# Patient Record
Sex: Male | Born: 1935
Health system: Southern US, Community
[De-identification: ages and names within clinical notes are randomized; demographics above are authoritative.]

## PROBLEM LIST (undated history)

## (undated) DIAGNOSIS — R4701 Aphasia: Secondary | ICD-10-CM

## (undated) DIAGNOSIS — R319 Hematuria, unspecified: Secondary | ICD-10-CM

## (undated) DIAGNOSIS — H8110 Benign paroxysmal vertigo, unspecified ear: Secondary | ICD-10-CM

## (undated) DIAGNOSIS — N320 Bladder-neck obstruction: Secondary | ICD-10-CM

## (undated) DIAGNOSIS — Z8546 Personal history of malignant neoplasm of prostate: Secondary | ICD-10-CM

## (undated) DIAGNOSIS — N3040 Irradiation cystitis without hematuria: Secondary | ICD-10-CM

## (undated) DIAGNOSIS — E785 Hyperlipidemia, unspecified: Secondary | ICD-10-CM

## (undated) DIAGNOSIS — M1990 Unspecified osteoarthritis, unspecified site: Secondary | ICD-10-CM

## (undated) DIAGNOSIS — R339 Retention of urine, unspecified: Secondary | ICD-10-CM

## (undated) DIAGNOSIS — K5790 Diverticulosis of intestine, part unspecified, without perforation or abscess without bleeding: Secondary | ICD-10-CM

## (undated) DIAGNOSIS — I10 Essential (primary) hypertension: Secondary | ICD-10-CM

## (undated) DIAGNOSIS — Z87440 Personal history of urinary (tract) infections: Secondary | ICD-10-CM

## (undated) DIAGNOSIS — R32 Unspecified urinary incontinence: Secondary | ICD-10-CM

## (undated) DIAGNOSIS — H269 Unspecified cataract: Secondary | ICD-10-CM

## (undated) DIAGNOSIS — K627 Radiation proctitis: Secondary | ICD-10-CM

## (undated) DIAGNOSIS — E039 Hypothyroidism, unspecified: Secondary | ICD-10-CM

## (undated) DIAGNOSIS — Z87898 Personal history of other specified conditions: Secondary | ICD-10-CM

## (undated) HISTORY — PX: SPINE SURGERY: SHX786

## (undated) HISTORY — DX: Hyperlipidemia, unspecified: E78.5

## (undated) HISTORY — PX: COLONOSCOPY W/ POLYPECTOMY: SHX1380

## (undated) HISTORY — DX: Unspecified cataract: H26.9

## (undated) HISTORY — DX: Aphasia: R47.01

## (undated) HISTORY — DX: Unspecified osteoarthritis, unspecified site: M19.90

## (undated) HISTORY — PX: EYE SURGERY: SHX253

## (undated) HISTORY — DX: Personal history of malignant neoplasm of prostate: Z85.46

## (undated) HISTORY — DX: Essential (primary) hypertension: I10

## (undated) HISTORY — DX: Diverticulosis of intestine, part unspecified, without perforation or abscess without bleeding: K57.90

## (undated) HISTORY — PX: KNEE ARTHROSCOPY: SUR90

## (undated) HISTORY — DX: Hypothyroidism, unspecified: E03.9

## (undated) HISTORY — DX: Benign paroxysmal vertigo, unspecified ear: H81.10

## (undated) HISTORY — PX: CATARACT EXTRACTION W/ INTRAOCULAR LENS IMPLANT: SHX1309

---

## 1994-01-25 HISTORY — PX: LUMBAR DISC SURGERY: SHX700

## 1995-01-26 HISTORY — PX: PROSTATECTOMY: SHX69

## 1997-04-25 ENCOUNTER — Encounter: Admission: RE | Admit: 1997-04-25 | Discharge: 1997-07-24 | Payer: Self-pay | Admitting: *Deleted

## 1997-07-09 ENCOUNTER — Ambulatory Visit (HOSPITAL_COMMUNITY): Admission: RE | Admit: 1997-07-09 | Discharge: 1997-07-09 | Payer: Self-pay | Admitting: Urology

## 2003-10-24 ENCOUNTER — Ambulatory Visit (HOSPITAL_COMMUNITY): Admission: RE | Admit: 2003-10-24 | Discharge: 2003-10-24 | Payer: Self-pay | Admitting: Gastroenterology

## 2003-10-24 ENCOUNTER — Encounter (INDEPENDENT_AMBULATORY_CARE_PROVIDER_SITE_OTHER): Payer: Self-pay | Admitting: Specialist

## 2004-07-06 ENCOUNTER — Ambulatory Visit (HOSPITAL_BASED_OUTPATIENT_CLINIC_OR_DEPARTMENT_OTHER): Admission: RE | Admit: 2004-07-06 | Discharge: 2004-07-06 | Payer: Self-pay | Admitting: Urology

## 2004-07-06 ENCOUNTER — Encounter (INDEPENDENT_AMBULATORY_CARE_PROVIDER_SITE_OTHER): Payer: Self-pay | Admitting: Specialist

## 2004-07-06 ENCOUNTER — Ambulatory Visit (HOSPITAL_COMMUNITY): Admission: RE | Admit: 2004-07-06 | Discharge: 2004-07-06 | Payer: Self-pay | Admitting: Urology

## 2004-07-06 HISTORY — PX: OTHER SURGICAL HISTORY: SHX169

## 2006-02-22 ENCOUNTER — Encounter: Admission: RE | Admit: 2006-02-22 | Discharge: 2006-03-10 | Payer: Self-pay | Admitting: Orthopedic Surgery

## 2007-10-11 ENCOUNTER — Ambulatory Visit (HOSPITAL_COMMUNITY): Admission: RE | Admit: 2007-10-11 | Discharge: 2007-10-11 | Payer: Self-pay | Admitting: Urology

## 2010-06-12 NOTE — Op Note (Signed)
NAMESALAH, NAKAMURA               ACCOUNT NO.:  [id number]   MEDICAL RECORD NO.:  [id number]          PATIENT TYPE:  AMB   LOCATION:  ENDO                         FACILITY:  [facility]   PHYSICIAN:  Danise Edge, M.D.   DATE OF BIRTH:  Nov 24, 1932   DATE OF PROCEDURE:  10/24/2003  DATE OF DISCHARGE:                                 OPERATIVE REPORT   PROCEDURE:  Colonoscopy with polypectomy.   INDICATIONS FOR PROCEDURE:  Mr. Jermiah Soderman is a 77 year old male born  04/22/1932.  Mr. Mceachin is scheduled to undergo his first screening  colonoscopy with polypectomy to prevent colon cancer.   ENDOSCOPIST:  Danise Edge, M.D.   PREMEDICATION:  Versed 6 mg, Demerol 60 mg.   DESCRIPTION OF PROCEDURE:  After obtaining informed consent, Mr. Buch was  placed in the left lateral decubitus position. I administered intravenous  Demerol and intravenous Versed to achieve conscious sedation for the  procedure. The patient's blood pressure, oxygen saturation and cardiac  rhythm were monitored throughout the procedure and documented in the medical  record.   Anal inspection was normal. Mr. Ollinger has received radiation therapy for  prostate cancer.  His anal canal was somewhat narrowed and I did not perform  a digital rectal exam. The Olympus adjustable pediatric colonoscope was  introduced into the rectum and advanced to the cecum. A normal appearing  ileocecal valve was intubated and the distal ileum inspected.  Colonic  preparation for the exam today was excellent.   Mr. Bhat has universal colonic diverticulosis without diverticulitis or  diverticular stricture formation.   RECTUM:  Mr. Ozer has mild radiation proctitis involving the distal  rectum.   SIGMOID COLON AND DESCENDING COLON:  At 30 cm from the anal verge, a 1 mm  sessile polyp was removed with the cold biopsy forceps.   SPLENIC FLEXURE:  Normal.   TRANSVERSE COLON:  Normal.   HEPATIC FLEXURE:  Normal.   ASCENDING  COLON:  Normal.   CECUM AND ILEOCECAL VALVE:  Normal.   ASSESSMENT:  1.  Small polyp was removed from the distal sigmoid colon with cold biopsy      forceps.  2.  Universal colonic diverticulosis.  3.  Mild radiation proctitis.      MJ/MEDQ  D:  10/24/2003  T:  10/24/2003  Job:  [id number]   cc:   Ike Bene, M.D.  [address]  [place]  [place] [address]  Fax: [phone]

## 2010-06-12 NOTE — Op Note (Signed)
[name], [name]               ACCOUNT NO.:  [id number]   MEDICAL RECORD NO.:  [id number]          PATIENT TYPE:  AMB   LOCATION:  [facility]                         FACILITY:  [facility]   PHYSICIAN:  Mark C. Vernie Ammons, M.D.  DATE OF BIRTH:  11-Jul-1932   DATE OF PROCEDURE:  DATE OF DISCHARGE:                                 OPERATIVE REPORT   PREOPERATIVE DIAGNOSES:  1.  Gross hematuria.  2.  Bladder neck contracture.   POSTOPERATIVE DIAGNOSES:  1.  Gross hematuria secondary to radiation cystitis.  2.  Bladder neck contracture.   PROCEDURES:  Cystoscopy, fulguration of bleeders and transurethral resection  of bladder neck contracture.   SURGEON:  Mark C. Vernie Ammons, M.D.   ANESTHESIA:  General.   SPECIMENS:  Bladder neck chips to pathology.   BLOOD LOSS:  Minimal.   COMPLICATIONS:  None.   DRAINS:  None.   COMPLICATIONS:  None.   INDICATIONS:  The patient is a 77 year old white male who has been having  difficulty with recurrent gross hematuria for several weeks. He has had this  difficulty intermittently for several months, but it has become more  frequent. He has had radiation for high-grade adenocarcinoma of the prostate  (Gleason 8) and was found to have radiation cystitis on cytoscopically back  in March of 2006. He also was noted to have a bladder neck contracture that  required dilation last month. He is brought to the OR today for treatment of  the bladder neck contracture and the recurrent bleeding. He said he bled as  recently as last night.   DESCRIPTION OF OPERATION:  After informed consent, the patient was brought  to the major OR, placed on the table, administered general anesthesia and  also received preoperative antibiotics. He was placed in the dorsal  lithotomy position after adequate general anesthesia and the genitalia were  then sterilely prepped and draped. Initially a 21-French cystoscope with 12  degrees lens was inserted and direct visualization  urethra revealed it was  entirely normal down to the bulbar urethra and just at the membranous  urethral region. There were some small telangiectatic vessels. Upon entering  the prostatic urethra, I noted some telangiectatic neovascularity at the  base of the prostate. The bladder neck contracture was noted. It was opened  somewhat from the previous dilatation, but there was still some bladder neck  contracture present. I then entered the bladder and inspected it with both  12 and 70 degree lenses. Ureteral orifices were of normal configuration and  position and there were no tumors or stones. There was marked telangiectatic  blood vessels on the base of the bladder. There was a single area that  appeared to have bled recently. This was photographed and then fulgurated  with the resectoscope. I then used the resectoscope to resect the bladder  neck contracture. I resected at both the 3 and 9 o'clock positions, opening  the bladder neck widely. The portions of bladder neck that were resected  were then removed from the bladder and then reinspection revealed some  bleeding in the prostatic urethra. This area was  fulgurated as were several  potential bleeding points within the bladder. These potential areas appeared  to be very superficial, thin-walled telangiectatic vessels. I then  fulgurated the neovascularity in the prostatic urethra and any bleeding  points. At the end of the procedure, there was no bleeding. I then drained  the bladder and removed the resectoscope and the patient was awakened and  taken to the recovery room in stable satisfactory condition. He tolerated  procedure well with no intraoperative complications.   He will be given a prescription for 24 Pyridium Plus for bladder irritation  and then return to see me if he has further bleeding. Otherwise I will see  him back in for his routine six-month check.       MCO/MEDQ  D:  07/06/2004  T:  07/06/2004  Job:  [id number]

## 2010-06-23 ENCOUNTER — Encounter: Payer: Self-pay | Admitting: Physician Assistant

## 2010-07-06 ENCOUNTER — Encounter: Payer: Self-pay | Admitting: Internal Medicine

## 2010-07-28 ENCOUNTER — Ambulatory Visit (AMBULATORY_SURGERY_CENTER): Payer: Medicare Other

## 2010-07-28 VITALS — Ht 74.0 in | Wt 250.5 lb

## 2010-07-28 DIAGNOSIS — R195 Other fecal abnormalities: Secondary | ICD-10-CM

## 2010-07-28 DIAGNOSIS — Z86010 Personal history of colonic polyps: Secondary | ICD-10-CM

## 2010-07-28 MED ORDER — PEG-KCL-NACL-NASULF-NA ASC-C 100 G PO SOLR: 1.0000 | Freq: Once | ORAL | Status: AC

## 2010-08-11 ENCOUNTER — Encounter: Payer: Self-pay | Admitting: Internal Medicine

## 2010-08-11 ENCOUNTER — Ambulatory Visit (AMBULATORY_SURGERY_CENTER): Payer: Medicare Other | Admitting: Internal Medicine

## 2010-08-11 VITALS — BP 142/89 | HR 61 | Temp 97.6°F | Resp 18 | Ht 74.0 in | Wt 250.0 lb

## 2010-08-11 DIAGNOSIS — D126 Benign neoplasm of colon, unspecified: Secondary | ICD-10-CM

## 2010-08-11 DIAGNOSIS — K627 Radiation proctitis: Secondary | ICD-10-CM | POA: Insufficient documentation

## 2010-08-11 DIAGNOSIS — Z1211 Encounter for screening for malignant neoplasm of colon: Secondary | ICD-10-CM

## 2010-08-11 DIAGNOSIS — Z860100 Personal history of colon polyps, unspecified: Secondary | ICD-10-CM | POA: Insufficient documentation

## 2010-08-11 DIAGNOSIS — K5730 Diverticulosis of large intestine without perforation or abscess without bleeding: Secondary | ICD-10-CM

## 2010-08-11 DIAGNOSIS — K624 Stenosis of anus and rectum: Secondary | ICD-10-CM

## 2010-08-11 MED ORDER — SODIUM CHLORIDE 0.9 % IV SOLN: 500.0000 mL | INTRAVENOUS | Status: DC

## 2010-08-11 NOTE — Patient Instructions (Addendum)
Please eat a diet that is high in fiber.  You may resume your medications as you would normally take them.  DO NOT USE Aspirin or Aspirin containing products(Advil or arthritis products) for 2 weeks (08/25/2010).   ONLY USE TYLENOL.

## 2010-08-12 ENCOUNTER — Telehealth: Payer: Self-pay

## 2010-08-12 NOTE — Telephone Encounter (Signed)
No answer

## 2010-08-19 ENCOUNTER — Encounter: Payer: Self-pay | Admitting: Internal Medicine

## 2011-06-01 DIAGNOSIS — C61 Malignant neoplasm of prostate: Secondary | ICD-10-CM | POA: Diagnosis not present

## 2011-06-08 DIAGNOSIS — C61 Malignant neoplasm of prostate: Secondary | ICD-10-CM | POA: Diagnosis not present

## 2011-07-01 DIAGNOSIS — R5383 Other fatigue: Secondary | ICD-10-CM | POA: Diagnosis not present

## 2011-07-01 DIAGNOSIS — E039 Hypothyroidism, unspecified: Secondary | ICD-10-CM | POA: Diagnosis not present

## 2011-07-01 DIAGNOSIS — I10 Essential (primary) hypertension: Secondary | ICD-10-CM | POA: Diagnosis not present

## 2011-07-01 DIAGNOSIS — R5381 Other malaise: Secondary | ICD-10-CM | POA: Diagnosis not present

## 2011-10-11 DIAGNOSIS — H26499 Other secondary cataract, unspecified eye: Secondary | ICD-10-CM | POA: Diagnosis not present

## 2011-11-30 DIAGNOSIS — C61 Malignant neoplasm of prostate: Secondary | ICD-10-CM | POA: Diagnosis not present

## 2011-12-07 DIAGNOSIS — E291 Testicular hypofunction: Secondary | ICD-10-CM | POA: Diagnosis not present

## 2011-12-07 DIAGNOSIS — N281 Cyst of kidney, acquired: Secondary | ICD-10-CM | POA: Diagnosis not present

## 2011-12-07 DIAGNOSIS — C61 Malignant neoplasm of prostate: Secondary | ICD-10-CM | POA: Diagnosis not present

## 2011-12-29 DIAGNOSIS — Z23 Encounter for immunization: Secondary | ICD-10-CM | POA: Diagnosis not present

## 2012-01-03 DIAGNOSIS — E039 Hypothyroidism, unspecified: Secondary | ICD-10-CM | POA: Diagnosis not present

## 2012-01-03 DIAGNOSIS — I10 Essential (primary) hypertension: Secondary | ICD-10-CM | POA: Diagnosis not present

## 2012-06-08 DIAGNOSIS — C61 Malignant neoplasm of prostate: Secondary | ICD-10-CM | POA: Diagnosis not present

## 2012-06-10 ENCOUNTER — Emergency Department (HOSPITAL_COMMUNITY)
Admission: EM | Admit: 2012-06-10 | Discharge: 2012-06-10 | Disposition: A | Discharge status: Home / Self Care | Payer: Medicare Other | Attending: Emergency Medicine | Admitting: Emergency Medicine

## 2012-06-10 ENCOUNTER — Encounter (HOSPITAL_COMMUNITY): Payer: Self-pay | Admitting: Emergency Medicine

## 2012-06-10 DIAGNOSIS — R3915 Urgency of urination: Secondary | ICD-10-CM | POA: Insufficient documentation

## 2012-06-10 DIAGNOSIS — Z7982 Long term (current) use of aspirin: Secondary | ICD-10-CM | POA: Diagnosis not present

## 2012-06-10 DIAGNOSIS — Z862 Personal history of diseases of the blood and blood-forming organs and certain disorders involving the immune mechanism: Secondary | ICD-10-CM | POA: Diagnosis not present

## 2012-06-10 DIAGNOSIS — Z923 Personal history of irradiation: Secondary | ICD-10-CM | POA: Insufficient documentation

## 2012-06-10 DIAGNOSIS — Z8669 Personal history of other diseases of the nervous system and sense organs: Secondary | ICD-10-CM | POA: Insufficient documentation

## 2012-06-10 DIAGNOSIS — Z9079 Acquired absence of other genital organ(s): Secondary | ICD-10-CM | POA: Diagnosis not present

## 2012-06-10 DIAGNOSIS — Z8719 Personal history of other diseases of the digestive system: Secondary | ICD-10-CM | POA: Insufficient documentation

## 2012-06-10 DIAGNOSIS — R319 Hematuria, unspecified: Secondary | ICD-10-CM | POA: Insufficient documentation

## 2012-06-10 DIAGNOSIS — R339 Retention of urine, unspecified: Secondary | ICD-10-CM | POA: Diagnosis present

## 2012-06-10 DIAGNOSIS — R109 Unspecified abdominal pain: Secondary | ICD-10-CM | POA: Insufficient documentation

## 2012-06-10 DIAGNOSIS — Z79899 Other long term (current) drug therapy: Secondary | ICD-10-CM | POA: Insufficient documentation

## 2012-06-10 DIAGNOSIS — E039 Hypothyroidism, unspecified: Secondary | ICD-10-CM | POA: Diagnosis not present

## 2012-06-10 DIAGNOSIS — Z87891 Personal history of nicotine dependence: Secondary | ICD-10-CM | POA: Insufficient documentation

## 2012-06-10 DIAGNOSIS — Z860100 Personal history of colon polyps, unspecified: Secondary | ICD-10-CM | POA: Diagnosis not present

## 2012-06-10 DIAGNOSIS — Z8639 Personal history of other endocrine, nutritional and metabolic disease: Secondary | ICD-10-CM | POA: Diagnosis not present

## 2012-06-10 DIAGNOSIS — N320 Bladder-neck obstruction: Secondary | ICD-10-CM | POA: Diagnosis not present

## 2012-06-10 DIAGNOSIS — Z9889 Other specified postprocedural states: Secondary | ICD-10-CM | POA: Diagnosis not present

## 2012-06-10 DIAGNOSIS — I10 Essential (primary) hypertension: Secondary | ICD-10-CM | POA: Diagnosis not present

## 2012-06-10 DIAGNOSIS — Z8546 Personal history of malignant neoplasm of prostate: Secondary | ICD-10-CM | POA: Insufficient documentation

## 2012-06-10 LAB — URINE MICROSCOPIC-ADD ON

## 2012-06-10 LAB — URINALYSIS, ROUTINE W REFLEX MICROSCOPIC
Bilirubin Urine: NEGATIVE
Glucose, UA: NEGATIVE mg/dL
Ketones, ur: NEGATIVE mg/dL
Nitrite: NEGATIVE
Protein, ur: 30 mg/dL — AB
Specific Gravity, Urine: 1.014 (ref 1.005–1.030)
Urobilinogen, UA: 0.2 mg/dL (ref 0.0–1.0)
pH: 6.5 (ref 5.0–8.0)

## 2012-06-10 MED ORDER — CEPHALEXIN 500 MG PO CAPS: 500.0000 mg | ORAL_CAPSULE | Freq: Three times a day (TID) | ORAL | Status: DC

## 2012-06-10 NOTE — ED Provider Notes (Signed)
History     CSN: [id number]  Arrival date & time 06/10/12  1516   First MD Initiated Contact with Patient 06/10/12 1531      Chief Complaint  Patient presents with  . Urinary Retention    (Consider location/radiation/quality/duration/timing/severity/associated sxs/prior treatment) HPI Comments: Pt with prostatectomy many years ago for prostate cancer, had radiation, has had intermittent problems with scar tissue in the past causing retention and requiring surgical intervention.  He reprots this AM at 5 AM, woke up and had to go badly, managed to go a lot.  He then later at 6:30 AM had a BM and noticed some blood from his urine.  Since then has had extreme difficulty trying to urinate and now is quite uncomfortable from retention.  He spoke to Dr. Margarita Grizzle with urology and was told to come to the ED as no one was in office.  Pt denies N/V, no fevers, chills, no radiation of pain to abdomen, back.  No other meds or therapies attempted prior to arrival.  The history is provided by the patient and the spouse.    Past Medical History  Diagnosis Date  . History of prostate cancer   . Colon polyps   . BPPV (benign paroxysmal positional vertigo)   . Hypertension   . Vitamin D deficiency   . Hypothyroidism   . Diverticulosis     Past Surgical History  Procedure Laterality Date  . Prostatectomy  1997  . Ly-s discects  1996  . Back surgery    . Colonoscopy w/ polypectomy  2005; 08/11/2010    2005: 1 cm hyperplastic sigmoid polyp, Dr. Laural Benes 2012: hyperplastic splenic flexure polyp -1 cm, diverticulosis, radiation proctitis, anal stenosis    Family History  Problem Relation Age of Onset  . Prostate cancer Brother     History  Substance Use Topics  . Smoking status: Former Smoker    Quit date: 07/27/1968  . Smokeless tobacco: Former Neurosurgeon    Quit date: 07/27/1968  . Alcohol Use: No      Review of Systems  Constitutional: Negative for fever and chills.  Gastrointestinal:  Negative for nausea, vomiting and diarrhea.  Genitourinary: Positive for urgency, hematuria and difficulty urinating. Negative for flank pain, scrotal swelling and penile pain.    Allergies  Caffeine and Codeine  Home Medications   Current Outpatient Rx  Name  Route  Sig  Dispense  Refill  . amLODipine (NORVASC) 5 MG tablet   Oral   Take 5 mg by mouth daily.           Marland Kitchen aspirin 81 MG chewable tablet   Oral   Chew 81 mg by mouth daily.         . Calcium Carbonate-Vitamin D (OYST-CAL D PO)   Oral   Take by mouth. Take 600 mg daily         . FLAXSEED, LINSEED, PO   Oral   Take by mouth daily. Take 1/4 cup daily           . Leuprolide Acetate, 6 Month, (LUPRON DEPOT) 45 MG injection   Intramuscular   Inject 45 mg into the muscle every 6 (six) months. Next dose will be Tuesday 06-13-2012         . Levothyroxine Sodium (SYNTHROID PO)   Oral   Take 1 tablet by mouth daily.         Marland Kitchen lisinopril (PRINIVIL,ZESTRIL) 20 MG tablet   Oral   Take 20 mg by mouth  daily.           . RED YEAST RICE EXTRACT PO   Oral   Take by mouth. Take 2 pills- 1200 mg daily         . Specialty Vitamins Products (MAGNESIUM, AMINO ACID CHELATE,) 133 MG tablet   Oral   Take 1 tablet by mouth. Take 300 mg every other day            BP 178/73  Pulse 72  Temp(Src) 97.9 F (36.6 C) (Oral)  SpO2 97%  Physical Exam  Nursing note and vitals reviewed. Constitutional: He appears well-developed and well-nourished. No distress.  HENT:  Head: Normocephalic and atraumatic.  Eyes: EOM are normal. No scleral icterus.  Neck: Neck supple.  Pulmonary/Chest: Effort normal.  Abdominal: Soft. Normal appearance. He exhibits no distension. There is tenderness in the suprapubic area. There is no rebound, no guarding and no CVA tenderness.  Neurological: He is alert.  Skin: Skin is warm. He is not diaphoretic.    ED Course  Procedures (including critical care time)  Labs Reviewed  URINE  CULTURE  URINALYSIS, ROUTINE W REFLEX MICROSCOPIC   No results found.   1. Urinary retention     RA sat is 97% and I interpret to be adequate  4:48 PM Spoke to Dr. Margarita Grizzle who will see the patient in the emergency department.   Pt had uretheral stretching by Dr. Margarita Grizzle, catheter placed. He recommends keflex for 3 days, can follo wup with Dr. Vernie Ammons on Tuesday as previously scheduled.     MDM  Pt with prior h/o urinary retention due to scar tissue after radiation treatments years ago.  Pt with similar presentation with hematuria, and difficulty urinating since early this AM.  RN has tried cath and [facility] without success, meeting resistance and causing moderate discomfort to patient.  Will discuss with Dr. Margarita Grizzle.          Gavin Pound. Mosi Hannold, MD 06/10/12 1818

## 2012-06-10 NOTE — ED Notes (Signed)
Met resistance while trying to insert foley catheter.  MD notified.

## 2012-06-10 NOTE — Discharge Instructions (Signed)
Acute Urinary Retention, Male  You have been seen by a caregiver today because of your inability to urinate (pass your water).  This is a common problem in elderly males. As men age their prostates become larger and block the flow of urine from the bladder. This is usually a problem that has come on gradually. It is often first noticed by having to get up at night to urinate. This is because as the prostate enlarges it is more difficult to empty the bladder completely.  Treatment may involve a one time catheterization to empty the bladder. This is putting in a tube to drain your urine. Then you and your personal caregiver can decide at your earliest convenience how to handle this problem in the future. It may also be a problem that may not recur for years. Sometimes this problem can be caused by medications. In this case, all that is often necessary is to discontinue the offending agent.  If you are to leave the foley catheter (a long, narrow, hollow tube) in and go home with a drainage system, you will need to discuss the best course of action with your caregiver. While the catheter is in, maintain a good intake of fluids. Keep the drainage bag emptied and lower than your catheter. This is so contaminated (infected) urine will not be flowing back into your bladder. This could lead to a urinary tract infection.  Only take over-the-counter or prescription medicines for pain, discomfort, or fever as directed by your caregiver.   SEEK IMMEDIATE MEDICAL CARE IF:   You develop chills, fever, or show signs of generalized illness that occurs prior to seeing your caregiver.  Document Released: 04/19/2000 Document Revised: 04/05/2011 Document Reviewed: 01/03/2008  [employer] Patient Information 2013 [employer].

## 2012-06-10 NOTE — ED Notes (Signed)
Urology in to place foley catheter.

## 2012-06-10 NOTE — ED Notes (Signed)
Attempted foley insertion with 16 coude catheter.  Met resistance, unable to advance.  Dr. Oletta Lamas notified.

## 2012-06-10 NOTE — Consult Note (Signed)
Urology Consult  Requesting provider:  Dr. Oletta Lamas  CC: Gross hematuria. Urinary retention.  HPI: 79 year old male who is followed by my colleague, Dr. Vernie Ammons,  For prostate cancer. This was treated in the past with prostatectomy followed by radiation. He has a history of bladder neck contracture. He developed gross hematuria. This began 24 hours ago. It is the color of cherry Kool-Aid. It is associated with dysuria. It was worse with physical activity. It is better when he is able to void small amounts. He does not feel that his thinking his bladder well. He denies fever or chills. As mentioned, his history of bladder neck contracture.  I was consult to evaluate the patient. I tried to gently place an 41 French coud-tip catheter which was unsuccessful. Because of this I elected to proceed with cystoscopy, possible urethral dilation, and Foley catheter placement. I discussed with the patient the risk and benefits, and alternatives. Informed consent was obtained and signed. Please refer to procedure below.  PMH: Past Medical History  Diagnosis Date  . History of prostate cancer   . Colon polyps   . BPPV (benign paroxysmal positional vertigo)   . Hypertension   . Vitamin D deficiency   . Hypothyroidism   . Diverticulosis     PSH: Past Surgical History  Procedure Laterality Date  . Prostatectomy  1997  . Ly-s discects  1996  . Back surgery    . Colonoscopy w/ polypectomy  2005; 08/11/2010    2005: 1 cm hyperplastic sigmoid polyp, Dr. Laural Benes 2012: hyperplastic splenic flexure polyp -1 cm, diverticulosis, radiation proctitis, anal stenosis    Allergies: Allergies  Allergen Reactions  . Caffeine Shortness Of Breath and Palpitations  . Codeine Palpitations    Medications:  (Not in a hospital admission)   Social History: History   Social History  . Marital Status: Married    Spouse Name: N/A    Number of Children: N/A  . Years of Education: N/A   Occupational History  .  Not on file.   Social History Main Topics  . Smoking status: Former Smoker    Quit date: 07/27/1968  . Smokeless tobacco: Former Neurosurgeon    Quit date: 07/27/1968  . Alcohol Use: No  . Drug Use: No  . Sexually Active: Not on file   Other Topics Concern  . Not on file   Social History Narrative  . No narrative on file    Family History: Family History  Problem Relation Age of Onset  . Prostate cancer Brother     Review of Systems: Positive: Frequency, urgency. Negative: Chest pain, SOB, or fever.  A further 10 point review of systems was negative except what is listed in the HPI.  Physical Exam: Filed Vitals:   06/10/12 1601  BP: 178/73  Pulse: 72  Temp: 97.9 F (36.6 C)    General: No acute distress.  Awake. Head:  Normocephalic.  Atraumatic. ENT:  EOMI.  Mucous membranes moist Neck:  Supple.  No lymphadenopathy. CV:  S1 present. S2 present. Regular rate. Pulmonary: Equal effort bilaterally.  Clear to auscultation bilaterally. Abdomen: Soft.  Non- tender to palpation. Skin:  Normal turgor.  No visible rash. Extremity: No gross deformity of bilateral upper extremities.  No gross deformity of    bilateral lower extremities. Neurologic: Alert. Appropriate mood.  Penis:  Uncircumcised.  No lesions. Urethra: No Foley catheter in place.  Orthotopic meatus. Scrotum: No lesions.  No ecchymosis.  No erythema. .  Studies:  No results found for this basename: HGB, WBC, PLT,  in the last 72 hours  No results found for this basename: NA, K, CL, CO2, BUN, CREATININE, CALCIUM, MAGNESIUM, GFRNONAA, GFRAA,  in the last 72 hours   No results found for this basename: PT, INR, APTT,  in the last 72 hours   No components found with this basename: ABG,   PROCEDURE: After informed consent was obtained the patient's genitals were prepped and draped in usual sterile fashion. I placed 10 cc of lidocaine jelly into the patient's urethra. A flexible cystoscope was advanced through the  urethra into the bladder neck. There was white tissue at the bladder neck which resulted in a bladder neck contracture. I could see into the bladder, but I could not pass the cystoscope into the bladder indicating that this was approximately 14 [place] in size. It was noted to be very firm and hard because I could not pass the flexed the cystoscope beyond this. After this I tried multiple other catheter such as 33 French coud tip catheters but this was unsuccessful. We elected to proceed with dilation. I placed a wire into the bladder and ended up as to avoid the wire going into the ureter orifice. I then withdrew the cystoscope and noted the length of the cystoscope to the bladder neck contracture. I then loaded a balloon dilator over the wire several centimeters beyond the area where the cystoscope was at the bladder neck contracture. I then slowly inflated the balloon to 10 atmospheres and held for several minutes. I then deflated the balloon and removed it leaving the wire in place. I then passed the cystoscope with ease. I backed the cystoscope off and in place an 41 French council-tip catheter over the wire. This was somewhat difficult to pass, but I was able to manipulate it and passed into the bladder. 10 cc of sterile water were placed into the balloon. I then irrigated his bladder free of clot as I did note a clot in the bladder when I passed the cystoscope and. I was not able to adequately see well inside the bladder due to the hematuria to visualize any tumors. He tolerated the procedure well.   Assessment:  Gross hematuria. Bladder neck contracture.  Plan: -Successful cystoscopy, dilation of bladder neck contracture, foley catheter placement. -Please send home with keflex 500mg  PO TID x3 days. -F/u as scheduled w/ Dr. Vernie Ammons this coming Tuesday.    Pager: [phone]    CC: Dr. Oletta Lamas

## 2012-06-10 NOTE — ED Notes (Signed)
Patient with urinary retention and small amounts of bloody urine.  Started this morning.  Patient called his urologist and was told to come here.

## 2012-06-12 LAB — URINE CULTURE
Colony Count: NO GROWTH
Culture: NO GROWTH

## 2012-06-13 DIAGNOSIS — C61 Malignant neoplasm of prostate: Secondary | ICD-10-CM | POA: Diagnosis not present

## 2012-06-13 DIAGNOSIS — N3040 Irradiation cystitis without hematuria: Secondary | ICD-10-CM | POA: Diagnosis not present

## 2012-06-13 DIAGNOSIS — N320 Bladder-neck obstruction: Secondary | ICD-10-CM | POA: Diagnosis not present

## 2012-06-13 DIAGNOSIS — M949 Disorder of cartilage, unspecified: Secondary | ICD-10-CM | POA: Diagnosis not present

## 2012-06-13 DIAGNOSIS — M899 Disorder of bone, unspecified: Secondary | ICD-10-CM | POA: Diagnosis not present

## 2012-06-14 ENCOUNTER — Encounter (HOSPITAL_COMMUNITY): Payer: Self-pay | Admitting: *Deleted

## 2012-06-14 ENCOUNTER — Other Ambulatory Visit: Payer: Self-pay | Admitting: Urology

## 2012-06-14 ENCOUNTER — Emergency Department (HOSPITAL_COMMUNITY)
Admission: EM | Admit: 2012-06-14 | Discharge: 2012-06-14 | Disposition: A | Discharge status: Home / Self Care | Payer: Medicare Other | Attending: Emergency Medicine | Admitting: Emergency Medicine

## 2012-06-14 DIAGNOSIS — Z87891 Personal history of nicotine dependence: Secondary | ICD-10-CM | POA: Diagnosis not present

## 2012-06-14 DIAGNOSIS — Z8719 Personal history of other diseases of the digestive system: Secondary | ICD-10-CM | POA: Insufficient documentation

## 2012-06-14 DIAGNOSIS — Z860100 Personal history of colon polyps, unspecified: Secondary | ICD-10-CM | POA: Insufficient documentation

## 2012-06-14 DIAGNOSIS — I10 Essential (primary) hypertension: Secondary | ICD-10-CM | POA: Diagnosis not present

## 2012-06-14 DIAGNOSIS — R319 Hematuria, unspecified: Secondary | ICD-10-CM | POA: Diagnosis not present

## 2012-06-14 DIAGNOSIS — R3919 Other difficulties with micturition: Secondary | ICD-10-CM | POA: Diagnosis not present

## 2012-06-14 DIAGNOSIS — Z923 Personal history of irradiation: Secondary | ICD-10-CM | POA: Diagnosis not present

## 2012-06-14 DIAGNOSIS — Z8669 Personal history of other diseases of the nervous system and sense organs: Secondary | ICD-10-CM | POA: Insufficient documentation

## 2012-06-14 DIAGNOSIS — R109 Unspecified abdominal pain: Secondary | ICD-10-CM | POA: Insufficient documentation

## 2012-06-14 DIAGNOSIS — Z7982 Long term (current) use of aspirin: Secondary | ICD-10-CM | POA: Insufficient documentation

## 2012-06-14 DIAGNOSIS — T83091A Other mechanical complication of indwelling urethral catheter, initial encounter: Secondary | ICD-10-CM | POA: Diagnosis not present

## 2012-06-14 DIAGNOSIS — Z9889 Other specified postprocedural states: Secondary | ICD-10-CM | POA: Insufficient documentation

## 2012-06-14 DIAGNOSIS — Z862 Personal history of diseases of the blood and blood-forming organs and certain disorders involving the immune mechanism: Secondary | ICD-10-CM | POA: Diagnosis not present

## 2012-06-14 DIAGNOSIS — Z8546 Personal history of malignant neoplasm of prostate: Secondary | ICD-10-CM | POA: Insufficient documentation

## 2012-06-14 DIAGNOSIS — R300 Dysuria: Secondary | ICD-10-CM | POA: Insufficient documentation

## 2012-06-14 DIAGNOSIS — Z8639 Personal history of other endocrine, nutritional and metabolic disease: Secondary | ICD-10-CM | POA: Insufficient documentation

## 2012-06-14 DIAGNOSIS — N3289 Other specified disorders of bladder: Secondary | ICD-10-CM | POA: Diagnosis not present

## 2012-06-14 DIAGNOSIS — E039 Hypothyroidism, unspecified: Secondary | ICD-10-CM | POA: Insufficient documentation

## 2012-06-14 DIAGNOSIS — Z79899 Other long term (current) drug therapy: Secondary | ICD-10-CM | POA: Diagnosis not present

## 2012-06-14 DIAGNOSIS — Y846 Urinary catheterization as the cause of abnormal reaction of the patient, or of later complication, without mention of misadventure at the time of the procedure: Secondary | ICD-10-CM | POA: Insufficient documentation

## 2012-06-14 LAB — CBC WITH DIFFERENTIAL/PLATELET
Basophils Absolute: 0 10*3/uL (ref 0.0–0.1)
Basophils Relative: 0 % (ref 0–1)
Eosinophils Absolute: 0.3 10*3/uL (ref 0.0–0.7)
Eosinophils Relative: 5 % (ref 0–5)
MCH: 30.2 pg (ref 26.0–34.0)
MCHC: 33.3 g/dL (ref 30.0–36.0)
MCV: 90.7 fL (ref 78.0–100.0)
Neutrophils Relative %: 60 % (ref 43–77)
Platelets: 172 10*3/uL (ref 150–400)
RDW: 13 % (ref 11.5–15.5)

## 2012-06-14 LAB — BASIC METABOLIC PANEL
Calcium: 9.6 mg/dL (ref 8.4–10.5)
GFR calc Af Amer: 76 mL/min — ABNORMAL LOW (ref >90)
GFR calc non Af Amer: 66 mL/min — ABNORMAL LOW (ref >90)
Glucose, Bld: 125 mg/dL — ABNORMAL HIGH (ref 70–99)
Potassium: 4 mEq/L (ref 3.5–5.1)
Sodium: 134 mEq/L — ABNORMAL LOW (ref 135–145)

## 2012-06-14 NOTE — ED Notes (Signed)
Pt had catheter placed Saturday night; passing some blood; has been blocked since this after; states doctor told him to not remove catheter for any reason due to difficulty with placement initially; small amt blood tinged urine in bag

## 2012-06-14 NOTE — ED Provider Notes (Signed)
History     CSN: [id number]  Arrival date & time 06/14/12  1956   First MD Initiated Contact with Patient 06/14/12 2013      No chief complaint on file.   (Consider location/radiation/quality/duration/timing/severity/associated sxs/prior treatment) The history is provided by the patient and the spouse.  [name] is a 79 y.o. male history of hypertension, prostate cancer status post surgery and radiation with urethral stricture, with Foley placed 5 days ago presenting with blocked Foley. He noticed that his urine stopped flowing since yesterday. No fevers or chills or vomiting. He felt bladder spasms and lower abdominal pain. He is currently on Bactrim as per his urologist.    Past Medical History  Diagnosis Date  . History of prostate cancer   . Colon polyps   . BPPV (benign paroxysmal positional vertigo)   . Hypertension   . Vitamin D deficiency   . Hypothyroidism   . Diverticulosis     Past Surgical History  Procedure Laterality Date  . Prostatectomy  1997  . Ly-s discects  1996  . Back surgery    . Colonoscopy w/ polypectomy  2005; 08/11/2010    2005: 1 cm hyperplastic sigmoid polyp, Dr. Laural Benes 2012: hyperplastic splenic flexure polyp -1 cm, diverticulosis, radiation proctitis, anal stenosis    Family History  Problem Relation Age of Onset  . Prostate cancer Brother     History  Substance Use Topics  . Smoking status: Former Smoker    Quit date: 07/27/1968  . Smokeless tobacco: Former Neurosurgeon    Quit date: 07/27/1968  . Alcohol Use: No      Review of Systems  Genitourinary: Positive for hematuria and difficulty urinating.  All other systems reviewed and are negative.    Allergies  Caffeine and Codeine  Home Medications   Current Outpatient Rx  Name  Route  Sig  Dispense  Refill  . amLODipine (NORVASC) 5 MG tablet   Oral   Take 5 mg by mouth every morning.          Marland Kitchen aspirin 81 MG chewable tablet   Oral   Chew 81 mg by mouth every  morning.          . calcium carbonate (OS-CAL) 600 MG TABS   Oral   Take 600 mg by mouth every morning.         Marland Kitchen FLAXSEED, LINSEED, PO   Oral   Take 30 mLs by mouth every morning. Take 1/4 cup daily         . Leuprolide Acetate, 6 Month, (LUPRON DEPOT) 45 MG injection   Intramuscular   Inject 45 mg into the muscle every 6 (six) months. Next dose will be Tuesday 06-13-2012         . levothyroxine (SYNTHROID, LEVOTHROID) 25 MCG tablet   Oral   Take 25 mcg by mouth daily before breakfast.         . lisinopril (PRINIVIL,ZESTRIL) 20 MG tablet   Oral   Take 20 mg by mouth every morning.          Marland Kitchen PRESCRIPTION MEDICATION   Oral   Take 1 capsule by mouth every morning. Sample pack- 7 day course given by MD for bladder spasms         . RED YEAST RICE EXTRACT PO   Oral   Take 2 capsules by mouth every morning. Take 2 pills- 1200 mg daily         . Specialty Vitamins Products (MAGNESIUM,  AMINO ACID CHELATE,) 133 MG tablet   Oral   Take 1 tablet by mouth every other day. Take 300 mg every other day         . sulfamethoxazole-trimethoprim (BACTRIM DS) 800-160 MG per tablet   Oral   Take 1 tablet by mouth 2 (two) times daily.         . cephALEXin (KEFLEX) 500 MG capsule   Oral   Take 1 capsule (500 mg total) by mouth 3 (three) times daily.   10 capsule   0     BP 181/66  Pulse 81  Temp(Src) 98.1 F (36.7 C) (Oral)  Resp 20  SpO2 96%  Physical Exam  Nursing note and vitals reviewed. Constitutional: He is oriented to person, place, and time.  Uncomfortable   HENT:  Head: Normocephalic.  Mouth/Throat: Oropharynx is clear and moist.  Eyes: Conjunctivae are normal. Pupils are equal, round, and reactive to light.  Neck: Normal range of motion. Neck supple.  Cardiovascular: Normal rate, regular rhythm and normal heart sounds.   Pulmonary/Chest: Effort normal and breath sounds normal. No respiratory distress. He has no wheezes. He has no rales.   Abdominal: Soft. Bowel sounds are normal.  + suprapubic tenderness, + enlarged bladder. Foley with no urine output   Musculoskeletal: Normal range of motion.  Neurological: He is alert and oriented to person, place, and time.  Skin: Skin is warm and dry.  Psychiatric: He has a normal mood and affect. His behavior is normal. Judgment and thought content normal.    ED Course  Procedures (including critical care time)  Foley flushed with 60 cc normal saline. There are clots that are dislodged and his foley is draining out bloody urine that cleared.    Labs Reviewed  BASIC METABOLIC PANEL - Abnormal; Notable for the following:    Sodium 134 (*)    Glucose, Bld 125 (*)    GFR calc non Af Amer 66 (*)    GFR calc Af Amer 76 (*)    All other components within normal limits  CBC WITH DIFFERENTIAL   No results found.   No diagnosis found.    MDM  [name] is a 78 y.o. male here with blocked foley. I flushed the foley and it cleared up. Will check kidney function.   10:05 PM Cr nl. Still draining clear urine. He has urology appointment in about a week. Recommend continue abx. Return to ER if foley is obstructed again.         Richardean Canal, MD 06/14/12 [phone]

## 2012-06-15 ENCOUNTER — Encounter (HOSPITAL_BASED_OUTPATIENT_CLINIC_OR_DEPARTMENT_OTHER): Payer: Self-pay | Admitting: *Deleted

## 2012-06-15 NOTE — Progress Notes (Signed)
NPO AFTER MN. ARRIVES AT 0930. NEEDS ISTAT AND EKG. WILL TAKE NORVASC AND SYNTHROID AM OF SURG W/ SIP OF WATER.

## 2012-06-22 DIAGNOSIS — N320 Bladder-neck obstruction: Secondary | ICD-10-CM | POA: Diagnosis present

## 2012-06-22 NOTE — H&P (Signed)
[name] is a 79 year old male patient who recently developed urinary retention.   History of Present Illness This is a 79 year old patient seen in follow up for evaluation of prostate cancer.  Diagnosis: Pathologic stage, T3a, N0, MX prostate cancer with positive surgical margins, with extracapsular extension  Date of diagnosis: 1996  Type of treatment:  Retropubic Radical Prostatectomy  Date of treatment: 04/1994  Pretreatment PSA:   Gleason score: 8-9  Biochemical recurrence: 1998  Staging Studies: Bone Scan 09/2007:, CT of the abdomen/pelvis 09/2007:.  Prostate Biopsies: Positive prostate biopsy (03/1994).                           In 3/99 he underwent XRT for a PSA recurrence.       He began hormone ablation therapy with Degarelix on the CS 35 study protocol which began 11/09. He had been receiving Degarelix every 3 months. This resulted in a fall of his PSA and after completing the study his PSA began to rise slowly. He is was restarted on Lupron in 11/12 and has remained on that. He has developed hot flashes but they're mild and he does not want to try any medication for it.  Urinary function: He denies incontinence.  Erectile function: He does complain of erectile dysfunction.  Bowel function: He complains of fecal urgency, but denies any bowel related symptoms.  Other symptoms: no recent weight loss, no new bone or joint pain              He was found in 2/09 to have what appeared to be a Bosniak class IIF cyst of the left kidney which has been followed with serial imaging studies. No significant changes have been noted.  Interval history: He was unable to void and was seen by Dr. Margarita Grizzle who ended up having to perform cystoscopy. There was a bladder neck contracture that allowed the scope to see into the bladder but the scope could not be advanced through the contracture and therefore he was dilated and a catheter was placed.    Past Medical History Problems  1. History of   Bladder Neck Contracture 596.0 2. History of  Hypercholesterolemia 272.0 3. History of  Male Stress Incontinence 788.32 4. History of  Radiation Therapy V15.3 5. History of  Sudden Redness Of The Skin (Flushing) 782.62  Surgical History Problems  1. History of  Back Surgery 2. History of  Biopsy Of The Prostate Needle 3. History of  Prostatect Retropubic Radical W/ Bilat Pelv Lymphadenectomy  Current Meds 1. AmLODIPine Besylate 5 MG Oral Tablet; Therapy: (Recorded:15May2012) to 2. Aspirin 325 MG Oral Tablet; Therapy: (Recorded:21Aug2008) to 3. Calcium 600 TABS; Therapy: (Recorded:15May2012) to 4. Flax Seed Oil CAPS; Therapy: (Recorded:27Aug2009) to 5. Lisinopril 20 MG Oral Tablet; Therapy: (Recorded:15May2012) to 6. Magnesium TABS; Therapy: (Recorded:15May2012) to 7. Metamucil POWD; Therapy: (Recorded:15May2012) to 8. Red Yeast Rice CAPS; Therapy: (Recorded:15May2012) to 9. Selenium 200 MCG Oral Capsule; Therapy: (Recorded:14May2013) to 10. Thyroid TABS; OTC medication; Therapy: (Recorded:15May2012) to 11. Vitamin D3 CAPS; Therapy: (Recorded:15May2012) to  Allergies Medication  1. Codeine Derivatives  Family History Problems  1. Maternal history of  Congestive Heart Failure 2. Fraternal history of  Prostate Cancer V16.42  Social History Problems  1. Caffeine Use 1 2. Marital History - Currently Married 3. History of  Tobacco Use V15.82 quit 40 years ago; smoked one pack per day for 20 years  Review of Systems Genitourinary, constitutional and gastrointestinal system(s)  were reviewed and pertinent findings if present are noted.  Genitourinary: nocturia, but no incontinence.  Gastrointestinal: diarrhea.  Constitutional: no recent weight loss.  Musculoskeletal: no bone pain.    Vitals Vital Signs  Blood Pressure: 162 / 70 Temperature: 97.4 F Heart Rate: 73  BMI Calculated: 32.47 BSA Calculated: 2.34 Height: 6 ft 1 in Weight: 245 lb   Physical  Exam Constitutional: Well nourished and well developed . No acute distress.  ENT:. The ears and nose are normal in appearance.  Neck: The appearance of the neck is normal and no neck mass is present.  Pulmonary: No respiratory distress and normal respiratory rhythm and effort.  Cardiovascular:. No peripheral edema.  Rectal: Rectal exam demonstrates normal sphincter tone and the anus is normal on inspection. Normal rectal tone, no rectal masses, prostate is smooth, symmetric and non-tender. The prostate has no nodularity, is not indurated, is not tender and is not fluctuant. The perineum is normal on inspection.  Skin: Normal skin turgor, no visible rash and no visible skin lesions.  Neuro/Psych:. Mood and affect are appropriate.    Assessment Assessed  1. Prostate Cancer 185 2. 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 Bladder Neck Contracture 596.0   His DEXA scan revealed a T score of -1.2 at the femoral neck and a T score of 1.3 total 4 views lumbar spine placing him at a very low risk of a SRE. I have recommended he remain on vitamin D, calcium and weightbearing exercise.  His prostate cancer seems to be under good control with Lupron. His testosterone is castrate and his PSA actually has decreased. I recommended he remain on Lupron.  He has redeveloped his bladder neck contracture. We therefore discussed transurethral incision of the bladder neck as an outpatient under anesthesia. I've gone over the procedure with him in detail including its risks and palpitations, the probability of success in the outpatient nature the procedure as well as anticipated postoperative course. He understands and has elected to proceed.   Plan Bladder Neck Contracture (596.0)           1. Begin Septra DS while a Foley catheter remains indwelling prior to surgery. 2. He'll be scheduled for transurethral resection of his bladder neck contracture.

## 2012-06-23 ENCOUNTER — Encounter (HOSPITAL_BASED_OUTPATIENT_CLINIC_OR_DEPARTMENT_OTHER): Payer: Self-pay | Admitting: *Deleted

## 2012-06-23 ENCOUNTER — Ambulatory Visit (HOSPITAL_BASED_OUTPATIENT_CLINIC_OR_DEPARTMENT_OTHER)
Admission: RE | Admit: 2012-06-23 | Discharge: 2012-06-23 | Disposition: A | Discharge status: Home / Self Care | Payer: Medicare Other | Source: Ambulatory Visit | Attending: Urology | Admitting: Urology

## 2012-06-23 ENCOUNTER — Ambulatory Visit (HOSPITAL_BASED_OUTPATIENT_CLINIC_OR_DEPARTMENT_OTHER): Payer: Medicare Other | Admitting: Anesthesiology

## 2012-06-23 ENCOUNTER — Encounter (HOSPITAL_BASED_OUTPATIENT_CLINIC_OR_DEPARTMENT_OTHER)
Admission: RE | Disposition: A | Discharge status: Home / Self Care | Payer: Self-pay | Source: Ambulatory Visit | Attending: Urology

## 2012-06-23 ENCOUNTER — Encounter (HOSPITAL_BASED_OUTPATIENT_CLINIC_OR_DEPARTMENT_OTHER): Payer: Self-pay | Admitting: Anesthesiology

## 2012-06-23 DIAGNOSIS — N320 Bladder-neck obstruction: Secondary | ICD-10-CM | POA: Insufficient documentation

## 2012-06-23 DIAGNOSIS — R310 Gross hematuria: Secondary | ICD-10-CM | POA: Insufficient documentation

## 2012-06-23 DIAGNOSIS — Z923 Personal history of irradiation: Secondary | ICD-10-CM | POA: Diagnosis not present

## 2012-06-23 DIAGNOSIS — N3289 Other specified disorders of bladder: Secondary | ICD-10-CM | POA: Diagnosis not present

## 2012-06-23 DIAGNOSIS — Z9079 Acquired absence of other genital organ(s): Secondary | ICD-10-CM | POA: Insufficient documentation

## 2012-06-23 DIAGNOSIS — N529 Male erectile dysfunction, unspecified: Secondary | ICD-10-CM | POA: Diagnosis not present

## 2012-06-23 DIAGNOSIS — R152 Fecal urgency: Secondary | ICD-10-CM | POA: Diagnosis not present

## 2012-06-23 DIAGNOSIS — Z87891 Personal history of nicotine dependence: Secondary | ICD-10-CM | POA: Insufficient documentation

## 2012-06-23 DIAGNOSIS — N3000 Acute cystitis without hematuria: Secondary | ICD-10-CM | POA: Diagnosis not present

## 2012-06-23 DIAGNOSIS — Z7982 Long term (current) use of aspirin: Secondary | ICD-10-CM | POA: Insufficient documentation

## 2012-06-23 DIAGNOSIS — Z8546 Personal history of malignant neoplasm of prostate: Secondary | ICD-10-CM | POA: Insufficient documentation

## 2012-06-23 DIAGNOSIS — C61 Malignant neoplasm of prostate: Secondary | ICD-10-CM | POA: Diagnosis not present

## 2012-06-23 DIAGNOSIS — E7800 Pure hypercholesterolemia, unspecified: Secondary | ICD-10-CM | POA: Diagnosis not present

## 2012-06-23 DIAGNOSIS — Z79899 Other long term (current) drug therapy: Secondary | ICD-10-CM | POA: Diagnosis not present

## 2012-06-23 DIAGNOSIS — Y842 Radiological procedure and radiotherapy as the cause of abnormal reaction of the patient, or of later complication, without mention of misadventure at the time of the procedure: Secondary | ICD-10-CM | POA: Insufficient documentation

## 2012-06-23 DIAGNOSIS — R351 Nocturia: Secondary | ICD-10-CM | POA: Insufficient documentation

## 2012-06-23 DIAGNOSIS — Z885 Allergy status to narcotic agent status: Secondary | ICD-10-CM | POA: Diagnosis not present

## 2012-06-23 DIAGNOSIS — K810 Acute cholecystitis: Secondary | ICD-10-CM | POA: Diagnosis not present

## 2012-06-23 HISTORY — DX: Bladder-neck obstruction: N32.0

## 2012-06-23 HISTORY — DX: Irradiation cystitis without hematuria: N30.40

## 2012-06-23 HISTORY — DX: Personal history of other specified conditions: Z87.898

## 2012-06-23 HISTORY — DX: Hematuria, unspecified: R31.9

## 2012-06-23 HISTORY — DX: Radiation proctitis: K62.7

## 2012-06-23 HISTORY — PX: TRANSURETHRAL RESECTION OF BLADDER NECK: SHX6196

## 2012-06-23 HISTORY — DX: Personal history of urinary (tract) infections: Z87.440

## 2012-06-23 LAB — POCT I-STAT 4, (NA,K, GLUC, HGB,HCT)
Glucose, Bld: 111 mg/dL — ABNORMAL HIGH (ref 70–99)
HCT: 45 % (ref 39.0–52.0)
Hemoglobin: 15.3 g/dL (ref 13.0–17.0)
Potassium: 4.4 mEq/L (ref 3.5–5.1)
Sodium: 137 mEq/L (ref 135–145)

## 2012-06-23 SURGERY — RESECTION, BLADDER NECK, TRANSURETHRAL
Anesthesia: General | Site: Bladder | Wound class: Clean Contaminated

## 2012-06-23 MED ORDER — DEXAMETHASONE SODIUM PHOSPHATE 4 MG/ML IJ SOLN
INTRAMUSCULAR | Status: DC | PRN
  Administered 2012-06-23: 10 mg via INTRAVENOUS

## 2012-06-23 MED ORDER — ONDANSETRON HCL 4 MG/2ML IJ SOLN
INTRAMUSCULAR | Status: DC | PRN
  Administered 2012-06-23: 4 mg via INTRAVENOUS

## 2012-06-23 MED ORDER — PHENAZOPYRIDINE HCL 200 MG PO TABS: 200.0000 mg | ORAL_TABLET | Freq: Three times a day (TID) | ORAL | Status: DC | PRN

## 2012-06-23 MED ORDER — FENTANYL CITRATE 0.05 MG/ML IJ SOLN
INTRAMUSCULAR | Status: DC | PRN
  Administered 2012-06-23: 25 ug via INTRAVENOUS
  Administered 2012-06-23: 50 ug via INTRAVENOUS
  Administered 2012-06-23: 25 ug via INTRAVENOUS

## 2012-06-23 MED ORDER — HYDROCODONE-ACETAMINOPHEN 10-325 MG PO TABS: 1.0000 | ORAL_TABLET | Freq: Four times a day (QID) | ORAL | Status: DC | PRN

## 2012-06-23 MED ORDER — PROMETHAZINE HCL 25 MG/ML IJ SOLN
6.2500 mg | INTRAMUSCULAR | Status: DC | PRN
  Filled 2012-06-23: qty 1

## 2012-06-23 MED ORDER — LACTATED RINGERS IV SOLN
INTRAVENOUS | Status: DC
  Administered 2012-06-23: 10:00:00 via INTRAVENOUS
  Filled 2012-06-23: qty 1000

## 2012-06-23 MED ORDER — LIDOCAINE HCL (CARDIAC) 20 MG/ML IV SOLN
INTRAVENOUS | Status: DC | PRN
  Administered 2012-06-23: 80 mg via INTRAVENOUS

## 2012-06-23 MED ORDER — CIPROFLOXACIN IN D5W 400 MG/200ML IV SOLN
400.0000 mg | INTRAVENOUS | Status: AC
  Administered 2012-06-23: 400 mg via INTRAVENOUS
  Filled 2012-06-23: qty 200

## 2012-06-23 MED ORDER — KETOROLAC TROMETHAMINE 30 MG/ML IJ SOLN
15.0000 mg | Freq: Once | INTRAMUSCULAR | Status: DC | PRN
  Filled 2012-06-23: qty 1

## 2012-06-23 MED ORDER — PHENAZOPYRIDINE HCL 200 MG PO TABS
200.0000 mg | ORAL_TABLET | Freq: Three times a day (TID) | ORAL | Status: DC
  Administered 2012-06-23: 200 mg via ORAL
  Filled 2012-06-23: qty 1

## 2012-06-23 MED ORDER — FENTANYL CITRATE 0.05 MG/ML IJ SOLN
25.0000 ug | INTRAMUSCULAR | Status: DC | PRN
  Filled 2012-06-23: qty 1

## 2012-06-23 MED ORDER — SODIUM CHLORIDE 0.9 % IR SOLN
Status: DC | PRN
  Administered 2012-06-23: 6000 mL via INTRAVESICAL

## 2012-06-23 MED ORDER — PROPOFOL 10 MG/ML IV BOLUS
INTRAVENOUS | Status: DC | PRN
  Administered 2012-06-23: 250 mg via INTRAVENOUS

## 2012-06-23 SURGICAL SUPPLY — 36 items
BAG DRAIN URO-CYSTO SKYTR STRL (DRAIN) ×2 IMPLANT
BAG URINE DRAINAGE (UROLOGICAL SUPPLIES) ×2 IMPLANT
BAG URINE LEG 19OZ MD ST LTX (BAG) IMPLANT
CANISTER SUCT LVC 12 LTR MEDI- (MISCELLANEOUS) ×4 IMPLANT
CATH FOLEY 3WAY 30CC 24FR (CATHETERS) ×1
CATH HEMA 3WAY 30CC 24FR COUDE (CATHETERS) IMPLANT
CATH HEMA 3WAY 30CC 24FR RND (CATHETERS) IMPLANT
CATH URTH STD 24FR FL 3W 2 (CATHETERS) ×1 IMPLANT
CLOTH BEACON ORANGE TIMEOUT ST (SAFETY) ×2 IMPLANT
DRAPE CAMERA CLOSED 9X96 (DRAPES) ×2 IMPLANT
ELECT BUTTON BIOP 24F 90D PLAS (MISCELLANEOUS) IMPLANT
ELECT BUTTON HF 24-28F 2 30DE (ELECTRODE) IMPLANT
ELECT LOOP HF 26F 30D .35MM (CUTTING LOOP) IMPLANT
ELECT LOOP MED HF 24F 12D (CUTTING LOOP) IMPLANT
ELECT LOOP MED HF 24F 12D CBL (CLIP) ×2 IMPLANT
ELECT REM PT RETURN 9FT ADLT (ELECTROSURGICAL) ×2
ELECTRODE REM PT RTRN 9FT ADLT (ELECTROSURGICAL) ×1 IMPLANT
EVACUATOR MICROVAS BLADDER (UROLOGICAL SUPPLIES) ×2 IMPLANT
GLOVE BIO SURGEON STRL SZ 6 (GLOVE) ×2 IMPLANT
GLOVE BIO SURGEON STRL SZ 6.5 (GLOVE) ×2 IMPLANT
GLOVE BIO SURGEON STRL SZ7 (GLOVE) ×2 IMPLANT
GLOVE BIO SURGEON STRL SZ8 (GLOVE) ×2 IMPLANT
GLOVE ECLIPSE 6.5 STRL STRAW (GLOVE) ×2 IMPLANT
GLOVE INDICATOR 7.0 STRL GRN (GLOVE) ×2 IMPLANT
GOWN PREVENTION PLUS LG XLONG (DISPOSABLE) ×4 IMPLANT
GOWN STRL REIN XL XLG (GOWN DISPOSABLE) ×2 IMPLANT
HOLDER FOLEY CATH W/STRAP (MISCELLANEOUS) ×2 IMPLANT
IV NS IRRIG 3000ML ARTHROMATIC (IV SOLUTION) IMPLANT
KIT ASPIRATION TUBING (SET/KITS/TRAYS/PACK) ×2 IMPLANT
LOOP CUTTING 24FR OLYMPUS (CUTTING LOOP) IMPLANT
PACK CYSTOSCOPY (CUSTOM PROCEDURE TRAY) ×2 IMPLANT
PLUG CATH AND CAP STER (CATHETERS) IMPLANT
SET ASPIRATION TUBING (TUBING) ×2 IMPLANT
SYR 30ML LL (SYRINGE) IMPLANT
SYRINGE IRR TOOMEY STRL 70CC (SYRINGE) IMPLANT
WATER STERILE IRR 3000ML UROMA (IV SOLUTION) IMPLANT

## 2012-06-23 NOTE — Anesthesia Procedure Notes (Signed)
Procedure Name: LMA Insertion Date/Time: 06/23/2012 11:02 AM Performed by: Norva Pavlov Pre-anesthesia Checklist: Patient identified, Emergency Drugs available, Suction available and Patient being monitored Patient Re-evaluated:Patient Re-evaluated prior to inductionOxygen Delivery Method: Circle System Utilized Preoxygenation: Pre-oxygenation with 100% oxygen Intubation Type: IV induction Ventilation: Mask ventilation without difficulty LMA: LMA inserted LMA Size: 5.0 Number of attempts: 1 Airway Equipment and Method: bite block Placement Confirmation: positive ETCO2 Tube secured with: Tape Dental Injury: Teeth and Oropharynx as per pre-operative assessment

## 2012-06-23 NOTE — Transfer of Care (Signed)
Immediate Anesthesia Transfer of Care Note  Patient: [name]  Procedure(s) Performed: Procedure(s) (LRB): TRANSURETHRAL RESECTION OF BLADDER NECK CONTRACTURE WITH GYRUS (N/A)  Patient Location: PACU  Anesthesia Type: General  Level of Consciousness: awake, alert  and oriented  Airway & Oxygen Therapy: Patient Spontanous Breathing and Patient connected to face mask oxygen  Post-op Assessment: Report given to PACU RN and Post -op Vital signs reviewed and stable  Post vital signs: Reviewed and stable  Complications: No apparent anesthesia complications

## 2012-06-23 NOTE — Anesthesia Preprocedure Evaluation (Addendum)
Anesthesia Evaluation  Patient identified by MRN, date of birth, ID band Patient awake    Reviewed: Allergy & Precautions, H&P , NPO status , Patient's Chart, lab work & pertinent test results  Airway Mallampati: II TM Distance: >3 FB Neck ROM: Full    Dental no notable dental hx.    Pulmonary neg pulmonary ROS,  breath sounds clear to auscultation  Pulmonary exam normal       Cardiovascular hypertension, Pt. on medications Rhythm:Regular Rate:Normal     Neuro/Psych negative neurological ROS  negative psych ROS   GI/Hepatic negative GI ROS, Neg liver ROS,   Endo/Other  Hypothyroidism   Renal/GU negative Renal ROS  negative genitourinary   Musculoskeletal negative musculoskeletal ROS (+)   Abdominal   Peds negative pediatric ROS (+)  Hematology negative hematology ROS (+)   Anesthesia Other Findings   Reproductive/Obstetrics negative OB ROS                           Anesthesia Physical Anesthesia Plan  ASA: II  Anesthesia Plan: General   Post-op Pain Management:    Induction: Intravenous  Airway Management Planned: LMA  Additional Equipment:   Intra-op Plan:   Post-operative Plan:   Informed Consent: I have reviewed the patients History and Physical, chart, labs and discussed the procedure including the risks, benefits and alternatives for the proposed anesthesia with the patient or authorized representative who has indicated his/her understanding and acceptance.   Dental advisory given  Plan Discussed with: CRNA and Surgeon  Anesthesia Plan Comments:         Anesthesia Quick Evaluation

## 2012-06-23 NOTE — Interval H&P Note (Signed)
History and Physical Interval Note:  06/23/2012 10:40 AM  [name]  has presented today for surgery, with the diagnosis of bladder neck contracture  The various methods of treatment have been discussed with the patient and family. After consideration of risks, benefits and other options for treatment, the patient has consented to  Procedure(s): TRANSURETHRAL RESECTION OF BLADDER NECK CONTRACTURE WITH GYRUS (N/A) as a surgical intervention .  The patient's history has been reviewed, patient examined, no change in status, stable for surgery.  I have reviewed the patient's chart and labs.  Questions were answered to the patient's satisfaction.     Garnett Farm

## 2012-06-23 NOTE — Op Note (Signed)
PATIENT:  [name]  PRE-OPERATIVE DIAGNOSIS: 1. Bladder neck contracture. 2. Gross hematuria  POST-OPERATIVE DIAGNOSIS: Same  PROCEDURE: 1. Transurethral resection of bladder neck contracture. 2. Diagnostic cystoscopy.  SURGEON:  Garnett Farm  INDICATION: [name] is a 79 year old male with a past history of prostate cancer treated with radical prostatectomy in 4/96. In 3/99 he underwent radiation therapy for a PSA recurrence. He then developed a bladder neck contracture and gross hematuria in 6/06 and underwent transurethral resection of his bladder neck contracture and fulguration of bleeders from the effects of radiation on his bladder. He has now redeveloped gross hematuria and also was found to have a bladder neck contracture. He is brought to the operating room today for resection of his bladder neck contracture and fulguration of any obvious bleeding source.  ANESTHESIA:  General  EBL:  Minimal  DRAINS: None  LOCAL MEDICATIONS USED:  None  SPECIMEN:    Description of procedure: After informed consent the patient was taken to the major or, placed on the table and administered general anesthesia. He was then moved to the dorsal lithotomy position and his genitalia sterilely prepped and draped. An official timeout was then performed.  I initially passed the 22 French cystoscope with 12 lens under direct vision down the urethra which is noted normal. The sphincter appeared intact. The prostate was surgically absent and there was a relative bladder neck contracture that had been somewhat dilated due to the presence of the indwelling Foley catheter. I then entered the bladder and noted the ureteral orifices were of normal configuration and position. There was clear reflux from both the right and left orifice. I noted enlarged, superficial vessels circumferentially at the level of the bladder neck. I inserted the 70 lens and could see these easily. There was one area of  active bleeding at approximately the 7:00 position.    I then inserted the resectoscope sheath with Timberlake obturator down to the bulbar urethral region where the obturator was removed and the resectoscope element with 12 lens was replaced. I then resected the bladder neck contracture. at the 12, 3, 6 and 9:00 positions. Care was taken to resect just at the bladder neck level in order to spare the sphincter. The Microvasive evacuator was then used to remove the portions of the bladder neck but were resected and these were sent to pathology.  I then used the resectoscope to fulgurate the bleeding point at the 7:00 position that was previously noted. No other bleeding was identified at the end of the operation. The bladder was then drained, the resectoscope removed and the patient was awakened and taken recovery room in stable and satisfactory condition. He tolerated procedure well with no intraoperative complications.   PLAN OF CARE: Discharge to home after PACU  PATIENT DISPOSITION:  PACU - hemodynamically stable.

## 2012-06-23 NOTE — Anesthesia Postprocedure Evaluation (Signed)
  Anesthesia Post-op Note  Patient: [name]  Procedure(s) Performed: Procedure(s) (LRB): TRANSURETHRAL RESECTION OF BLADDER NECK CONTRACTURE WITH GYRUS (N/A)  Patient Location: PACU  Anesthesia Type: General  Level of Consciousness: awake and alert   Airway and Oxygen Therapy: Patient Spontanous Breathing  Post-op Pain: mild  Post-op Assessment: Post-op Vital signs reviewed, Patient's Cardiovascular Status Stable, Respiratory Function Stable, Patent Airway and No signs of Nausea or vomiting  Last Vitals:  Filed Vitals:   06/23/12 1140  BP: 146/59  Pulse: 66  Temp: 37.1 C  Resp: 14    Post-op Vital Signs: stable   Complications: No apparent anesthesia complications

## 2012-06-26 ENCOUNTER — Encounter (HOSPITAL_BASED_OUTPATIENT_CLINIC_OR_DEPARTMENT_OTHER): Payer: Self-pay | Admitting: Urology

## 2012-06-30 DIAGNOSIS — N320 Bladder-neck obstruction: Secondary | ICD-10-CM | POA: Diagnosis not present

## 2012-07-04 ENCOUNTER — Ambulatory Visit (INDEPENDENT_AMBULATORY_CARE_PROVIDER_SITE_OTHER): Payer: Medicare Other | Admitting: Nurse Practitioner

## 2012-07-04 VITALS — BP 174/79 | HR 75 | Temp 97.7°F | Ht 72.5 in | Wt 250.5 lb

## 2012-07-04 DIAGNOSIS — I10 Essential (primary) hypertension: Secondary | ICD-10-CM | POA: Insufficient documentation

## 2012-07-04 DIAGNOSIS — E039 Hypothyroidism, unspecified: Secondary | ICD-10-CM | POA: Diagnosis not present

## 2012-07-04 DIAGNOSIS — Z23 Encounter for immunization: Secondary | ICD-10-CM | POA: Diagnosis not present

## 2012-07-04 DIAGNOSIS — E785 Hyperlipidemia, unspecified: Secondary | ICD-10-CM | POA: Diagnosis not present

## 2012-07-04 LAB — THYROID PANEL WITH TSH
Free Thyroxine Index: 3.5 (ref 1.0–3.9)
T4, Total: 8.3 ug/dL (ref 5.0–12.5)

## 2012-07-04 MED ORDER — LEVOTHYROXINE SODIUM 25 MCG PO TABS: 25.0000 ug | ORAL_TABLET | Freq: Every day | ORAL | Status: DC

## 2012-07-04 MED ORDER — AMLODIPINE BESYLATE 5 MG PO TABS: 5.0000 mg | ORAL_TABLET | Freq: Every morning | ORAL | Status: DC

## 2012-07-04 MED ORDER — LISINOPRIL 20 MG PO TABS: 20.0000 mg | ORAL_TABLET | Freq: Every morning | ORAL | Status: DC

## 2012-07-04 NOTE — Progress Notes (Signed)
Subjective:    Patient ID: [name], male    DOB: 1932-02-07, 79 y.o.   MRN: [id number]  Hypertension This is a chronic problem. The current episode started more than 1 year ago. The problem has been resolved since onset. The problem is controlled (120-130's systolic at home). Pertinent negatives include no blurred vision, chest pain, headaches, malaise/fatigue, palpitations, peripheral edema or shortness of breath. There are no associated agents to hypertension. Risk factors for coronary artery disease include dyslipidemia and male gender. Past treatments include ACE inhibitors and beta blockers. The current treatment provides significant improvement. There are no compliance problems.  Hypertensive end-organ damage includes a thyroid problem.  Hyperlipidemia This is a chronic problem. The current episode started more than 1 year ago. The problem is controlled. Recent lipid tests were reviewed and are normal. Exacerbating diseases include hypothyroidism. There are no known factors aggravating his hyperlipidemia. Pertinent negatives include no chest pain or shortness of breath. Current antihyperlipidemic treatment includes diet change. The current treatment provides moderate improvement of lipids. There are no compliance problems.  Risk factors for coronary artery disease include hypertension and male sex.  Thyroid Problem Presents for follow-up visit. Symptoms include fatigue (occassionally). Patient reports no dry skin, hair loss, heat intolerance, menstrual problem, palpitations, visual change, weight gain or weight loss. The symptoms have been stable. His past medical history is significant for hyperlipidemia.  Bladder blockage Just happened 1 month ago and had to have scar tissue removed from previous prostate surgery.- Doing much better.    Review of Systems  Constitutional: Positive for fatigue (occassionally). Negative for weight loss, weight gain and malaise/fatigue.  Eyes: Negative  for blurred vision.  Respiratory: Negative for shortness of breath.   Cardiovascular: Negative for chest pain and palpitations.  Endocrine: Negative for heat intolerance.  Genitourinary: Negative for menstrual problem.  Neurological: Negative for headaches.  All other systems reviewed and are negative.       Objective:   Physical Exam  Constitutional: He is oriented to person, place, and time. He appears well-developed and well-nourished.  HENT:  Head: Normocephalic.  Right Ear: External ear normal.  Left Ear: External ear normal.  Nose: Nose normal.  Mouth/Throat: Oropharynx is clear and moist.  Eyes: EOM are normal. Pupils are equal, round, and reactive to light.  Neck: Normal range of motion. Neck supple. No thyromegaly present.  Cardiovascular: Normal rate, regular rhythm, normal heart sounds and intact distal pulses.   No murmur heard. Pulmonary/Chest: Effort normal and breath sounds normal. He has no wheezes. He has no rales.  Abdominal: Soft. Bowel sounds are normal.  Musculoskeletal: Normal range of motion.  Neurological: He is alert and oriented to person, place, and time.  Skin: Skin is warm and dry.  Psychiatric: He has a normal mood and affect. His behavior is normal. Judgment and thought content normal.   BP 174/79  Pulse 75  Temp(Src) 97.7 F (36.5 C) (Oral)  Ht 6' 0.5" (1.842 m)  Wt 250 lb 8 oz (113.626 kg)  BMI 33.49 kg/m2        Assessment & Plan:  1. Hypertension *keep diary of blood pressure at home- patient says only elevated at Dr. office - COMPLETE METABOLIC PANEL WITH GFR - amLODipine (NORVASC) 5 MG tablet; Take 1 tablet (5 mg total) by mouth every morning.  Dispense: 90 tablet; Refill: 1 - lisinopril (PRINIVIL,ZESTRIL) 20 MG tablet; Take 1 tablet (20 mg total) by mouth every morning.  Dispense: 90 tablet; Refill: 1  2.  Hyperlipidemia Low fat diet an dexercise - NMR Lipoprofile with Lipids  3. Hypothyroidism - Thyroid Panel With TSH -  levothyroxine (SYNTHROID, LEVOTHROID) 25 MCG tablet; Take 1 tablet (25 mcg total) by mouth daily before breakfast.  Dispense: 90 tablet; Refill: 1  Pneumonia vaccine today  Mary-Margaret Daphine Deutscher, FNP

## 2012-07-04 NOTE — Patient Instructions (Addendum)
Health Maintenance, Males A healthy lifestyle and preventative care can promote health and wellness.  Maintain regular health, dental, and eye exams.  Eat a healthy diet. Foods like vegetables, fruits, whole grains, low-fat dairy products, and lean protein foods contain the nutrients you need without too many calories. Decrease your intake of foods high in solid fats, added sugars, and salt. Get information about a proper diet from your caregiver, if necessary.  Regular physical exercise is one of the most important things you can do for your health. Most adults should get at least 150 minutes of moderate-intensity exercise (any activity that increases your heart rate and causes you to sweat) each week. In addition, most adults need muscle-strengthening exercises on 2 or more days a week.   Maintain a healthy weight. The body mass index (BMI) is a screening tool to identify possible weight problems. It provides an estimate of body fat based on height and weight. Your caregiver can help determine your BMI, and can help you achieve or maintain a healthy weight. For adults 20 years and older:  A BMI below 18.5 is considered underweight.  A BMI of 18.5 to 24.9 is normal.  A BMI of 25 to 29.9 is considered overweight.  A BMI of 30 and above is considered obese.  Maintain normal blood lipids and cholesterol by exercising and minimizing your intake of saturated fat. Eat a balanced diet with plenty of fruits and vegetables. Blood tests for lipids and cholesterol should begin at age 20 and be repeated every 5 years. If your lipid or cholesterol levels are high, you are over 50, or you are a high risk for heart disease, you may need your cholesterol levels checked more frequently.Ongoing high lipid and cholesterol levels should be treated with medicines, if diet and exercise are not effective.  If you smoke, find out from your caregiver how to quit. If you do not use tobacco, do not start.  If you  choose to drink alcohol, do not exceed 2 drinks per day. One drink is considered to be 12 ounces (355 mL) of beer, 5 ounces (148 mL) of wine, or 1.5 ounces (44 mL) of liquor.  Avoid use of street drugs. Do not share needles with anyone. Ask for help if you need support or instructions about stopping the use of drugs.  High blood pressure causes heart disease and increases the risk of stroke. Blood pressure should be checked at least every 1 to 2 years. Ongoing high blood pressure should be treated with medicines if weight loss and exercise are not effective.  If you are 45 to 77 years old, ask your caregiver if you should take aspirin to prevent heart disease.  Diabetes screening involves taking a blood sample to check your fasting blood sugar level. This should be done once every 3 years, after age 45, if you are within normal weight and without risk factors for diabetes. Testing should be considered at a younger age or be carried out more frequently if you are overweight and have at least 1 risk factor for diabetes.  Colorectal cancer can be detected and often prevented. Most routine colorectal cancer screening begins at the age of 50 and continues through age 75. However, your caregiver may recommend screening at an earlier age if you have risk factors for colon cancer. On a yearly basis, your caregiver may provide home test kits to check for hidden blood in the stool. Use of a small camera at the end of a tube,   to directly examine the colon (sigmoidoscopy or colonoscopy), can detect the earliest forms of colorectal cancer. Talk to your caregiver about this at age 50, when routine screening begins. Direct examination of the colon should be repeated every 5 to 10 years through age 75, unless early forms of pre-cancerous polyps or small growths are found.  Hepatitis C blood testing is recommended for all people born from 1945 through 1965 and any individual with known risks for hepatitis C.  Healthy  men should no longer receive prostate-specific antigen (PSA) blood tests as part of routine cancer screening. Consult with your caregiver about prostate cancer screening.  Testicular cancer screening is not recommended for adolescents or adult males who have no symptoms. Screening includes self-exam, caregiver exam, and other screening tests. Consult with your caregiver about any symptoms you have or any concerns you have about testicular cancer.  Practice safe sex. Use condoms and avoid high-risk sexual practices to reduce the spread of sexually transmitted infections (STIs).  Use sunscreen with a sun protection factor (SPF) of 30 or greater. Apply sunscreen liberally and repeatedly throughout the day. You should seek shade when your shadow is shorter than you. Protect yourself by wearing long sleeves, pants, a wide-brimmed hat, and sunglasses year round, whenever you are outdoors.  Notify your caregiver of new moles or changes in moles, especially if there is a change in shape or color. Also notify your caregiver if a mole is larger than the size of a pencil eraser.  A one-time screening for abdominal aortic aneurysm (AAA) and surgical repair of large AAAs by sound wave imaging (ultrasonography) is recommended for ages 65 to 75 years who are current or former smokers.  Stay current with your immunizations. Document Released: 07/10/2007 Document Revised: 04/05/2011 Document Reviewed: 06/08/2010 [employer] Patient Information 2014 [employer].  Pneumococcal Vaccine, Polyvalent solution for injection What is this medicine? PNEUMOCOCCAL VACCINE, POLYVALENT (NEU mo KOK al vak SEEN, pol ee VEY luhnt) is a vaccine to prevent pneumococcus bacteria infection. These bacteria are a major cause of ear infections, Strep throat infections, and serious pneumonia, meningitis, or blood infections worldwide. These vaccines help the body to produce antibodies (protective substances) that help your body defend  against these bacteria. This vaccine is recommended for people 2 years of age and older with health problems. It is also recommended for all adults over 50 years old. This vaccine will not treat an infection. This medicine may be used for other purposes; ask your health care provider or pharmacist if you have questions. What should I tell my health care provider before I take this medicine? They need to know if you have any of these conditions: -bleeding problems -bone marrow or organ transplant -cancer, Hodgkin's disease -fever -infection -immune system problems -low platelet count in the blood -seizures -an unusual or allergic reaction to pneumococcal vaccine, diphtheria toxoid, other vaccines, latex, other medicines, foods, dyes, or preservatives -pregnant or trying to get pregnant -breast-feeding How should I use this medicine? This vaccine is for injection into a muscle or under the skin. It is given by a health care professional. A copy of Vaccine Information Statements will be given before each vaccination. Read this sheet carefully each time. The sheet may change frequently. Talk to your pediatrician regarding the use of this medicine in children. While this drug may be prescribed for children as young as 2 years of age for selected conditions, precautions do apply. Overdosage: If you think you have taken too much of this medicine   contact a poison control center or emergency room at once. NOTE: This medicine is only for you. Do not share this medicine with others. What if I miss a dose? It is important not to miss your dose. Call your doctor or health care professional if you are unable to keep an appointment. What may interact with this medicine? -medicines for cancer chemotherapy -medicines that suppress your immune function -medicines that treat or prevent blood clots like warfarin, enoxaparin, and dalteparin -steroid medicines like prednisone or cortisone This list may not  describe all possible interactions. Give your health care provider a list of all the medicines, herbs, non-prescription drugs, or dietary supplements you use. Also tell them if you smoke, drink alcohol, or use illegal drugs. Some items may interact with your medicine. What should I watch for while using this medicine? Mild fever and pain should go away in 3 days or less. Report any unusual symptoms to your doctor or health care professional. What side effects may I notice from receiving this medicine? Side effects that you should report to your doctor or health care professional as soon as possible: -allergic reactions like skin rash, itching or hives, swelling of the face, lips, or tongue -breathing problems -confused -fever over 102 degrees F -pain, tingling, numbness in the hands or feet -seizures -unusual bleeding or bruising -unusual muscle weakness Side effects that usually do not require medical attention (report to your doctor or health care professional if they continue or are bothersome): -aches and pains -diarrhea -fever of 102 degrees F or less -headache -irritable -loss of appetite -pain, tender at site where injected -trouble sleeping This list may not describe all possible side effects. Call your doctor for medical advice about side effects. You may report side effects to FDA at 1-800-FDA-1088. Where should I keep my medicine? This does not apply. This vaccine is given in a clinic, pharmacy, doctor's office, or other health care setting and will not be stored at home. NOTE: This sheet is a summary. It may not cover all possible information. If you have questions about this medicine, talk to your doctor, pharmacist, or health care provider.  2013, [employer]. (08/18/2007 2:32:37 PM)  

## 2012-07-05 ENCOUNTER — Encounter: Payer: Self-pay | Admitting: Nurse Practitioner

## 2012-07-05 LAB — COMPLETE METABOLIC PANEL WITH GFR
ALT: 15 U/L (ref 0–53)
AST: 15 U/L (ref 0–37)
Alkaline Phosphatase: 52 U/L (ref 39–117)
BUN: 21 mg/dL (ref 6–23)
Calcium: 10.1 mg/dL (ref 8.4–10.5)
Creat: 0.98 mg/dL (ref 0.50–1.35)
Total Bilirubin: 0.3 mg/dL (ref 0.3–1.2)

## 2012-07-05 LAB — NMR LIPOPROFILE WITH LIPIDS
Cholesterol, Total: 172 mg/dL (ref <200)
HDL Particle Number: 38.3 umol/L (ref >=30.5)
HDL-C: 50 mg/dL (ref >=40)
LDL (calc): 81 mg/dL (ref <100)
LDL Particle Number: 1341 nmol/L — ABNORMAL HIGH (ref <1000)
LP-IR Score: 54 — ABNORMAL HIGH (ref <=45)
Small LDL Particle Number: 826 nmol/L — ABNORMAL HIGH (ref <=527)
Triglycerides: 205 mg/dL — ABNORMAL HIGH (ref <150)
VLDL Size: 53.8 nm — ABNORMAL HIGH (ref <=46.6)

## 2012-07-13 ENCOUNTER — Telehealth: Payer: Self-pay | Admitting: Nurse Practitioner

## 2012-07-13 NOTE — Telephone Encounter (Signed)
Patient aware.

## 2012-07-26 DIAGNOSIS — R300 Dysuria: Secondary | ICD-10-CM | POA: Diagnosis not present

## 2012-11-01 DIAGNOSIS — Z23 Encounter for immunization: Secondary | ICD-10-CM | POA: Diagnosis not present

## 2012-11-10 DIAGNOSIS — H43399 Other vitreous opacities, unspecified eye: Secondary | ICD-10-CM | POA: Diagnosis not present

## 2012-12-05 DIAGNOSIS — C61 Malignant neoplasm of prostate: Secondary | ICD-10-CM | POA: Diagnosis not present

## 2012-12-14 DIAGNOSIS — C61 Malignant neoplasm of prostate: Secondary | ICD-10-CM | POA: Diagnosis not present

## 2013-01-05 ENCOUNTER — Ambulatory Visit (INDEPENDENT_AMBULATORY_CARE_PROVIDER_SITE_OTHER): Payer: Medicare Other | Admitting: Nurse Practitioner

## 2013-01-05 ENCOUNTER — Encounter: Payer: Self-pay | Admitting: Nurse Practitioner

## 2013-01-05 VITALS — BP 146/62 | Temp 96.9°F | Ht 72.0 in | Wt 254.0 lb

## 2013-01-05 DIAGNOSIS — E039 Hypothyroidism, unspecified: Secondary | ICD-10-CM | POA: Diagnosis not present

## 2013-01-05 DIAGNOSIS — E785 Hyperlipidemia, unspecified: Secondary | ICD-10-CM

## 2013-01-05 DIAGNOSIS — I10 Essential (primary) hypertension: Secondary | ICD-10-CM

## 2013-01-05 DIAGNOSIS — Z2911 Encounter for prophylactic immunotherapy for respiratory syncytial virus (RSV): Secondary | ICD-10-CM

## 2013-01-05 DIAGNOSIS — K627 Radiation proctitis: Secondary | ICD-10-CM

## 2013-01-05 DIAGNOSIS — K6289 Other specified diseases of anus and rectum: Secondary | ICD-10-CM | POA: Diagnosis not present

## 2013-01-05 DIAGNOSIS — K624 Stenosis of anus and rectum: Secondary | ICD-10-CM

## 2013-01-05 MED ORDER — AMLODIPINE BESYLATE 5 MG PO TABS: 5.0000 mg | ORAL_TABLET | Freq: Every morning | ORAL | Status: DC

## 2013-01-05 MED ORDER — LISINOPRIL 20 MG PO TABS: 20.0000 mg | ORAL_TABLET | Freq: Every morning | ORAL | Status: DC

## 2013-01-05 MED ORDER — LEVOTHYROXINE SODIUM 25 MCG PO TABS: 25.0000 ug | ORAL_TABLET | Freq: Every day | ORAL | Status: DC

## 2013-01-05 NOTE — Progress Notes (Signed)
Subjective:    Patient ID: [name], male    DOB: 06/30/1932, 79 y.o.   MRN: [id number]  Patient here today for follow up- no changes since last visit. Hypertension This is a chronic problem. The current episode started more than 1 year ago. The problem has been resolved since onset. The problem is controlled (120-130's systolic at home). Pertinent negatives include no blurred vision, chest pain, headaches, malaise/fatigue, palpitations, peripheral edema or shortness of breath. There are no associated agents to hypertension. Risk factors for coronary artery disease include dyslipidemia and male gender. Past treatments include ACE inhibitors and beta blockers. The current treatment provides significant improvement. There are no compliance problems.  Hypertensive end-organ damage includes a thyroid problem.  Hyperlipidemia This is a chronic problem. The current episode started more than 1 year ago. The problem is controlled. Recent lipid tests were reviewed and are normal. Exacerbating diseases include hypothyroidism. There are no known factors aggravating his hyperlipidemia. Pertinent negatives include no chest pain or shortness of breath. Current antihyperlipidemic treatment includes diet change. The current treatment provides moderate improvement of lipids. There are no compliance problems.  Risk factors for coronary artery disease include hypertension and male sex.  Thyroid Problem Presents for follow-up visit. Symptoms include fatigue (occassionally). Patient reports no dry skin, hair loss, heat intolerance, menstrual problem, palpitations, visual change, weight gain or weight loss. The symptoms have been stable. His past medical history is significant for hyperlipidemia.  Bladder blockage Just happened 1 month ago and had to have scar tissue removed from previous prostate surgery.- has constant urine leakage now- see's Dr/ Wynelle Link.    Review of Systems  Constitutional: Positive for fatigue  (occassionally). Negative for weight loss, weight gain and malaise/fatigue.  Eyes: Negative for blurred vision.  Respiratory: Negative for shortness of breath.   Cardiovascular: Negative for chest pain and palpitations.  Endocrine: Negative for heat intolerance.  Genitourinary: Negative for menstrual problem.  Neurological: Negative for headaches.  All other systems reviewed and are negative.       Objective:   Physical Exam  Constitutional: He is oriented to person, place, and time. He appears well-developed and well-nourished.  HENT:  Head: Normocephalic.  Right Ear: External ear normal.  Left Ear: External ear normal.  Nose: Nose normal.  Mouth/Throat: Oropharynx is clear and moist.  Eyes: EOM are normal. Pupils are equal, round, and reactive to light.  Neck: Normal range of motion. Neck supple. No thyromegaly present.  Cardiovascular: Normal rate, regular rhythm, normal heart sounds and intact distal pulses.   No murmur heard. Pulmonary/Chest: Effort normal and breath sounds normal. He has no wheezes. He has no rales.  Abdominal: Soft. Bowel sounds are normal.  Musculoskeletal: Normal range of motion.  Neurological: He is alert and oriented to person, place, and time.  Skin: Skin is warm and dry.  Psychiatric: He has a normal mood and affect. His behavior is normal. Judgment and thought content normal.   BP 146/62  Temp(Src) 96.9 F (36.1 C) (Oral)  Ht 6' (1.829 m)  Wt 254 lb (115.214 kg)  BMI 34.44 kg/m2        Assessment & Plan:  1. Radiation proctitis Keep follow up with Dr. Wynelle Link  2. Hypothyroidism contiunue current meds - levothyroxine (SYNTHROID, LEVOTHROID) 25 MCG tablet; Take 1 tablet (25 mcg total) by mouth daily before breakfast.  Dispense: 90 tablet; Refill: 1 - Thyroid Panel With TSH  3. Hypertension Keep check of blood pressure at home- always high in Dr.  office - amLODipine (NORVASC) 5 MG tablet; Take 1 tablet (5 mg total) by mouth every  morning.  Dispense: 90 tablet; Refill: 1 - lisinopril (PRINIVIL,ZESTRIL) 20 MG tablet; Take 1 tablet (20 mg total) by mouth every morning.  Dispense: 90 tablet; Refill: 1 - CMP14+EGFR  4. Hyperlipidemia Low fat diet and exercise - NMR, lipoprofile  5. Anal stenosis NO rectal exams to be done  Mary-Margaret Daphine Deutscher, FNP

## 2013-01-05 NOTE — Patient Instructions (Signed)
Health Maintenance, Males A healthy lifestyle and preventative care can promote health and wellness.  Maintain regular health, dental, and eye exams.  Eat a healthy diet. Foods like vegetables, fruits, whole grains, low-fat dairy products, and lean protein foods contain the nutrients you need without too many calories. Decrease your intake of foods high in solid fats, added sugars, and salt. Get information about a proper diet from your caregiver, if necessary.  Regular physical exercise is one of the most important things you can do for your health. Most adults should get at least 150 minutes of moderate-intensity exercise (any activity that increases your heart rate and causes you to sweat) each week. In addition, most adults need muscle-strengthening exercises on 2 or more days a week.   Maintain a healthy weight. The body mass index (BMI) is a screening tool to identify possible weight problems. It provides an estimate of body fat based on height and weight. Your caregiver can help determine your BMI, and can help you achieve or maintain a healthy weight. For adults 20 years and older:  A BMI below 18.5 is considered underweight.  A BMI of 18.5 to 24.9 is normal.  A BMI of 25 to 29.9 is considered overweight.  A BMI of 30 and above is considered obese.  Maintain normal blood lipids and cholesterol by exercising and minimizing your intake of saturated fat. Eat a balanced diet with plenty of fruits and vegetables. Blood tests for lipids and cholesterol should begin at age 20 and be repeated every 5 years. If your lipid or cholesterol levels are high, you are over 50, or you are a high risk for heart disease, you may need your cholesterol levels checked more frequently.Ongoing high lipid and cholesterol levels should be treated with medicines, if diet and exercise are not effective.  If you smoke, find out from your caregiver how to quit. If you do not use tobacco, do not start.  Lung  cancer screening is recommended for adults aged 55 80 years who are at high risk for developing lung cancer because of a history of smoking. Yearly low-dose computed tomography (CT) is recommended for people who have at least a 30-pack-year history of smoking and are a current smoker or have quit within the past 15 years. A pack year of smoking is smoking an average of 1 pack of cigarettes a day for 1 year (for example: 1 pack a day for 30 years or 2 packs a day for 15 years). Yearly screening should continue until the smoker has stopped smoking for at least 15 years. Yearly screening should also be stopped for people who develop a health problem that would prevent them from having lung cancer treatment.  If you choose to drink alcohol, do not exceed 2 drinks per day. One drink is considered to be 12 ounces (355 mL) of beer, 5 ounces (148 mL) of wine, or 1.5 ounces (44 mL) of liquor.  Avoid use of street drugs. Do not share needles with anyone. Ask for help if you need support or instructions about stopping the use of drugs.  High blood pressure causes heart disease and increases the risk of stroke. Blood pressure should be checked at least every 1 to 2 years. Ongoing high blood pressure should be treated with medicines if weight loss and exercise are not effective.  If you are 45 to 77 years old, ask your caregiver if you should take aspirin to prevent heart disease.  Diabetes screening involves taking a blood   sample to check your fasting blood sugar level. This should be done once every 3 years, after age 45, if you are within normal weight and without risk factors for diabetes. Testing should be considered at a younger age or be carried out more frequently if you are overweight and have at least 1 risk factor for diabetes.  Colorectal cancer can be detected and often prevented. Most routine colorectal cancer screening begins at the age of 50 and continues through age 75. However, your caregiver may  recommend screening at an earlier age if you have risk factors for colon cancer. On a yearly basis, your caregiver may provide home test kits to check for hidden blood in the stool. Use of a small camera at the end of a tube, to directly examine the colon (sigmoidoscopy or colonoscopy), can detect the earliest forms of colorectal cancer. Talk to your caregiver about this at age 50, when routine screening begins. Direct examination of the colon should be repeated every 5 to 10 years through age 75, unless early forms of pre-cancerous polyps or small growths are found.  Hepatitis C blood testing is recommended for all people born from 1945 through 1965 and any individual with known risks for hepatitis C.  Healthy men should no longer receive prostate-specific antigen (PSA) blood tests as part of routine cancer screening. Consult with your caregiver about prostate cancer screening.  Testicular cancer screening is not recommended for adolescents or adult males who have no symptoms. Screening includes self-exam, caregiver exam, and other screening tests. Consult with your caregiver about any symptoms you have or any concerns you have about testicular cancer.  Practice safe sex. Use condoms and avoid high-risk sexual practices to reduce the spread of sexually transmitted infections (STIs).  Use sunscreen. Apply sunscreen liberally and repeatedly throughout the day. You should seek shade when your shadow is shorter than you. Protect yourself by wearing long sleeves, pants, a wide-brimmed hat, and sunglasses year round, whenever you are outdoors.  Notify your caregiver of new moles or changes in moles, especially if there is a change in shape or color. Also notify your caregiver if a mole is larger than the size of a pencil eraser.  A one-time screening for abdominal aortic aneurysm (AAA) and surgical repair of large AAAs by sound wave imaging (ultrasonography) is recommended for ages 65 to 75 years who are  current or former smokers.  Stay current with your immunizations. Document Released: 07/10/2007 Document Revised: 05/08/2012 Document Reviewed: 06/08/2010 [employer] Patient Information 2014 [employer].  

## 2013-01-05 NOTE — Addendum Note (Signed)
Addended by: Bernita Buffy on: 01/05/2013 09:52 AM   Modules accepted: Orders

## 2013-01-07 LAB — CMP14+EGFR
ALT: 19 IU/L (ref 0–44)
AST: 21 IU/L (ref 0–40)
Albumin/Globulin Ratio: 1.7 (ref 1.1–2.5)
Alkaline Phosphatase: 63 IU/L (ref 39–117)
BUN/Creatinine Ratio: 24 — ABNORMAL HIGH (ref 10–22)
CO2: 22 mmol/L (ref 18–29)
Creatinine, Ser: 0.8 mg/dL (ref 0.76–1.27)
Globulin, Total: 2.6 g/dL (ref 1.5–4.5)
Potassium: 5.5 mmol/L — ABNORMAL HIGH (ref 3.5–5.2)
Sodium: 139 mmol/L (ref 134–144)

## 2013-01-07 LAB — NMR, LIPOPROFILE
HDL Particle Number: 39.5 umol/L (ref >=30.5)
LDLC SERPL CALC-MCNC: 95 mg/dL (ref <100)
LP-IR Score: 65 — ABNORMAL HIGH (ref <=45)
Small LDL Particle Number: 1387 nmol/L — ABNORMAL HIGH (ref <=527)

## 2013-01-07 LAB — THYROID PANEL WITH TSH: TSH: 4.76 u[IU]/mL — ABNORMAL HIGH (ref 0.450–4.500)

## 2013-01-08 ENCOUNTER — Other Ambulatory Visit: Payer: Self-pay | Admitting: Nurse Practitioner

## 2013-01-08 MED ORDER — LEVOTHYROXINE SODIUM 50 MCG PO TABS: 50.0000 ug | ORAL_TABLET | Freq: Every day | ORAL | Status: DC

## 2013-01-30 ENCOUNTER — Encounter: Payer: Self-pay | Admitting: *Deleted

## 2013-02-22 ENCOUNTER — Other Ambulatory Visit (INDEPENDENT_AMBULATORY_CARE_PROVIDER_SITE_OTHER): Payer: Medicare Other

## 2013-02-22 DIAGNOSIS — R5381 Other malaise: Secondary | ICD-10-CM | POA: Diagnosis not present

## 2013-02-22 DIAGNOSIS — R7989 Other specified abnormal findings of blood chemistry: Secondary | ICD-10-CM

## 2013-02-23 LAB — THYROID PANEL WITH TSH
Free Thyroxine Index: 2 (ref 1.2–4.9)
T3 Uptake Ratio: 30 % (ref 24–39)
T4, Total: 6.7 ug/dL (ref 4.5–12.0)
TSH: 4.8 u[IU]/mL — ABNORMAL HIGH (ref 0.450–4.500)

## 2013-05-15 ENCOUNTER — Encounter (HOSPITAL_COMMUNITY): Payer: Self-pay | Admitting: Emergency Medicine

## 2013-05-15 ENCOUNTER — Emergency Department (HOSPITAL_COMMUNITY)
Admission: EM | Admit: 2013-05-15 | Discharge: 2013-05-15 | Disposition: A | Discharge status: Home / Self Care | Payer: Medicare Other | Attending: Emergency Medicine | Admitting: Emergency Medicine

## 2013-05-15 DIAGNOSIS — Z7982 Long term (current) use of aspirin: Secondary | ICD-10-CM | POA: Diagnosis not present

## 2013-05-15 DIAGNOSIS — Z9079 Acquired absence of other genital organ(s): Secondary | ICD-10-CM | POA: Insufficient documentation

## 2013-05-15 DIAGNOSIS — N390 Urinary tract infection, site not specified: Secondary | ICD-10-CM | POA: Insufficient documentation

## 2013-05-15 DIAGNOSIS — Z8546 Personal history of malignant neoplasm of prostate: Secondary | ICD-10-CM | POA: Diagnosis not present

## 2013-05-15 DIAGNOSIS — Z8719 Personal history of other diseases of the digestive system: Secondary | ICD-10-CM | POA: Insufficient documentation

## 2013-05-15 DIAGNOSIS — I10 Essential (primary) hypertension: Secondary | ICD-10-CM | POA: Insufficient documentation

## 2013-05-15 DIAGNOSIS — R339 Retention of urine, unspecified: Secondary | ICD-10-CM | POA: Diagnosis not present

## 2013-05-15 DIAGNOSIS — Z79899 Other long term (current) drug therapy: Secondary | ICD-10-CM | POA: Insufficient documentation

## 2013-05-15 DIAGNOSIS — Z8669 Personal history of other diseases of the nervous system and sense organs: Secondary | ICD-10-CM | POA: Insufficient documentation

## 2013-05-15 DIAGNOSIS — Z923 Personal history of irradiation: Secondary | ICD-10-CM | POA: Diagnosis not present

## 2013-05-15 DIAGNOSIS — Z87891 Personal history of nicotine dependence: Secondary | ICD-10-CM | POA: Diagnosis not present

## 2013-05-15 DIAGNOSIS — E039 Hypothyroidism, unspecified: Secondary | ICD-10-CM | POA: Insufficient documentation

## 2013-05-15 DIAGNOSIS — Z9889 Other specified postprocedural states: Secondary | ICD-10-CM | POA: Diagnosis not present

## 2013-05-15 DIAGNOSIS — R310 Gross hematuria: Secondary | ICD-10-CM | POA: Diagnosis not present

## 2013-05-15 DIAGNOSIS — E559 Vitamin D deficiency, unspecified: Secondary | ICD-10-CM | POA: Diagnosis not present

## 2013-05-15 DIAGNOSIS — N320 Bladder-neck obstruction: Secondary | ICD-10-CM | POA: Diagnosis not present

## 2013-05-15 DIAGNOSIS — R319 Hematuria, unspecified: Secondary | ICD-10-CM | POA: Diagnosis not present

## 2013-05-15 LAB — URINALYSIS, ROUTINE W REFLEX MICROSCOPIC
GLUCOSE, UA: NEGATIVE mg/dL
KETONES UR: 40 mg/dL — AB
Nitrite: POSITIVE — AB
Specific Gravity, Urine: 1.021 (ref 1.005–1.030)
UROBILINOGEN UA: 0.2 mg/dL (ref 0.0–1.0)
pH: 6 (ref 5.0–8.0)

## 2013-05-15 LAB — URINE MICROSCOPIC-ADD ON

## 2013-05-15 MED ORDER — CEFTRIAXONE SODIUM 1 G IJ SOLR
1.0000 g | Freq: Once | INTRAMUSCULAR | Status: AC
  Administered 2013-05-15: 1 g via INTRAMUSCULAR
  Filled 2013-05-15: qty 10

## 2013-05-15 NOTE — ED Provider Notes (Signed)
CSN: [id number]     Arrival date & time 05/15/13  2012 History   First MD Initiated Contact with Patient 05/15/13 2023     Chief Complaint  Patient presents with  . Urinary Retention     (Consider location/radiation/quality/duration/timing/severity/associated sxs/prior Treatment) The history is provided by the patient and medical records.   This is a 80 year old male with past medical history significant for her hypertension, prostate cancer status post prostatectomy and 2 rounds of radiation therapy, presenting to the ED for urinary retention.  Pt was seen by Dr. Karsten Ro earlier today around 1400 and had a cystoscopy performed.  States bladder was emptied during this procedure but has been unable to urinate since then.  Pt states he has had to have Foley catheters placed in the past due to urinary retention.  Pt admit he has a lot of scar tissue present due to prior prostatectomy and prolonged radiation treatments and has had difficulties with catheter placement in the past. No fevers or chills.  VS stable on arrival.  Past Medical History  Diagnosis Date  . BPPV (benign paroxysmal positional vertigo)   . Hypertension   . Vitamin D deficiency   . Hypothyroidism   . Diverticulosis   . History of prostate cancer CURRENTLY  ELEVATED PSA--  LUPRON INJECTIONS    S/P PROSTATECTOMY AND RADIATION THERAPY  1997  . History of hemorrhagic cystitis     SECONDARY RADIATION THERAPY 1997  . BNC (bladder neck contracture)   . Foley catheter in place   . Radiation proctitis   . H/O diarrhea SECONDARY TO RADIATION THERAPY --  INTERMITANT  DIARRHEA  . Bladder spasm   . Radiation cystitis   . Hematuria    Past Surgical History  Procedure Laterality Date  . Prostatectomy  1997  . Colonoscopy w/ polypectomy  2005; 08/11/2010    2005: 1 cm hyperplastic sigmoid polyp, Dr. Wynetta Emery 2012: hyperplastic splenic flexure polyp -1 cm, diverticulosis, radiation proctitis, anal stenosis  . Cysto/ fulgeration of  bleeders/ resection bladder neck contracture  07-06-2004    BNC AND RADIATION CYSTITIS  . Cataract extraction w/ intraocular lens implant Right   . Lumbar disc surgery  1996    L4 -- L5  . Transurethral resection of bladder neck N/A 06/23/2012    Procedure: TRANSURETHRAL RESECTION OF BLADDER NECK CONTRACTURE WITH GYRUS;  Surgeon: Claybon Jabs, MD;  Location: [facility];  Service: Urology;  Laterality: N/A;   Family History  Problem Relation Age of Onset  . Prostate cancer Brother    History  Substance Use Topics  . Smoking status: Former Smoker -- 20 years    Types: Cigarettes    Quit date: 07/28/1979  . Smokeless tobacco: Former Systems developer    Types: [place] date: 07/28/1979     Comment: SMOKED AND CHEW TOBACCO FOR 20 YRS QUIT AGE 28  . Alcohol Use: No    Review of Systems  Genitourinary: Positive for difficulty urinating.  All other systems reviewed and are negative.     Allergies  Caffeine and Codeine  Home Medications   Prior to Admission medications   Medication Sig Start Date End Date Taking? Authorizing Provider  amLODipine (NORVASC) 5 MG tablet Take 1 tablet (5 mg total) by mouth every morning. 01/05/13  Yes Mary-Margaret Hassell Done, FNP  aspirin 81 MG chewable tablet Chew 81 mg by mouth every morning.    Yes Historical Provider, MD  calcium carbonate (OS-CAL) 600 MG TABS Take 600  mg by mouth every morning.   Yes Historical Provider, MD  Cholecalciferol (VITAMIN D3) 5000 UNITS CAPS Take 1 capsule by mouth daily.   Yes Historical Provider, MD  Coenzyme Q10-Red Yeast Rice 60-600 MG CAPS Take 2 capsules by mouth daily. THIS PILL ALSO HAS 100MG  NIACIN   Yes Historical Provider, MD  diphenhydrAMINE (BENADRYL) 25 MG tablet Take 25 mg by mouth every 6 (six) hours as needed for itching.   Yes Historical Provider, MD  FLAXSEED, LINSEED, PO Take 30 mLs by mouth every morning. GRINDS FLAX SEEDS AND PUTS IN 1/4 CUP OF WATER   Yes Historical Provider, MD   levothyroxine (SYNTHROID) 50 MCG tablet Take 1 tablet (50 mcg total) by mouth daily before breakfast. 01/08/13  Yes Mary-Margaret Hassell Done, FNP  lisinopril (PRINIVIL,ZESTRIL) 20 MG tablet Take 1 tablet (20 mg total) by mouth every morning. 01/05/13  Yes Mary-Margaret Hassell Done, FNP  Magnesium 300 MG CAPS Take 1 capsule by mouth daily.   Yes Historical Provider, MD  Leuprolide Acetate, 6 Month, (LUPRON DEPOT) 45 MG injection Inject 45 mg into the muscle every 6 (six) months.     Historical Provider, MD   BP 173/81  Pulse 79  Temp(Src) 97.9 F (36.6 C) (Oral)  Resp 18  Ht 6\' 1"  (1.854 m)  Wt 247 lb (112.038 kg)  BMI 32.59 kg/m2  SpO2 97%  Physical Exam  Nursing note and vitals reviewed. Constitutional: He is oriented to person, place, and time. He appears well-developed and well-nourished. No distress.  Appears uncomfortable  HENT:  Head: Normocephalic and atraumatic.  Mouth/Throat: Oropharynx is clear and moist.  Eyes: Conjunctivae and EOM are normal. Pupils are equal, round, and reactive to light.  Neck: Normal range of motion. Neck supple.  Cardiovascular: Normal rate, regular rhythm and normal heart sounds.   Pulmonary/Chest: Effort normal and breath sounds normal. No respiratory distress. He has no wheezes.  Abdominal: Soft. Bowel sounds are normal. There is no tenderness. There is no guarding.  pressure over suprapubic region  Musculoskeletal: Normal range of motion. He exhibits no edema.  Neurological: He is alert and oriented to person, place, and time.  Skin: Skin is warm and dry. He is not diaphoretic.  Psychiatric: He has a normal mood and affect.    ED Course  Procedures (including critical care time) Labs Review Labs Reviewed  URINALYSIS, ROUTINE W REFLEX MICROSCOPIC - Abnormal; Notable for the following:    Color, Urine BROWN (*)    APPearance TURBID (*)    Hgb urine dipstick LARGE (*)    Bilirubin Urine MODERATE (*)    Ketones, ur 40 (*)    Protein, ur >300 (*)     Nitrite POSITIVE (*)    Leukocytes, UA SMALL (*)    All other components within normal limits  URINE MICROSCOPIC-ADD ON - Abnormal; Notable for the following:    Bacteria, UA FIELD OBSCURED BY RBC'S (*)    All other components within normal limits    Imaging Review No results found.   EKG Interpretation None      MDM   Final diagnoses:  Urinary retention  UTI (lower urinary tract infection)   80 year old male with urinary retention for the past 8 hours status post cystoscopy procedure at urologist office this afternoon. Will perform bladder scan and have Foley catheter placed. We'll send urinalysis.  Bladder scan with 500cc in bladder.  Foley catheter placement attempted x2 without success, pt  uncomfortable during procedure. Small amount of grossly bloody urine returned.  Given recent instrumentation, will not force catheter further.  Consulted urology, Dr. Gaynelle Arabian who will place catheter under guidance.  Catheter placed without complications-- see separate procedure note.  Pt states he feels much better.  U/a nitrite +, pt given 1g Rocephin, FU in office tomorrow per Dr. Gaynelle Arabian. Pt discharged home in stable condition.  Larene Pickett, PA-C 05/16/13 0003

## 2013-05-15 NOTE — Op Note (Signed)
Pre-operative diagnosis :   Urethral trauma, bladder neck contracture, hemorrhagic radiation cystitis  Postoperative diagnosis:  same  Operation:  Flexible bedside cystoscopy, placement of 22F council catheter ( 10cc baloon)  Surgeon:  S. Gaynelle Arabian, MD  First assistant:  none  Anesthesia:  { Xylocaine 2% gel local  Preparation:  After appropriate evaluation, the pt remained in the supine position, where the penis was prepped and draped in the usual fashion. Xylocaine 2% gel was inserted into the urethra. The armband weas double checked.   Review history:   [name] is a 80 yo mwm from [place], [place], post RRP for CaP in 1996-followed by EBRT in 1997. He developeda bladder neck contracture, released by Dr.Ottelin in 1997. He has since developed PSA recurrent disease, treated with Lupron Rx. He has developed Hemorrhagic cystitis and proctitis, 2ndary to delayed Radiation effect. He has had recurrent urinary retention, and is now post cystoscopy today perDr. Karsten Ro in the office, but with subsequent AUR. He was seen in [facility], wit attempted foley cath placement yielding painful sensation when the baloon was inflated. 200cc of urine was removed from the bladder and the catheter was removed,and Urology has now been consulted   Statement of  Likelihood of Success: Excellent. TIME-OUT observed.:  Procedure:  Flexible bedside cystoscopy was accomplished, showing penduluous urethral trauma. Clot within the urethra was irrigated free of the urethra, but the cystoscope could not be passed more proximal thatn the bulbar urethra. The urethra appeared to have partial disruption, so I did not want to dilate or re-traumatize it. n 0.038 guidewire was passed through the bulbar urethral urethral stricture, and into the bladder. A 16 F Council catheter was then passed into the bladder and urine drained. The urine was bloody. The pt felt a sense of relief.    He was covered with rocephin 1 gram, and held in the ER  while irrigated.    He is instructed to see Dr. Karsten Ro in AM.

## 2013-05-15 NOTE — ED Notes (Signed)
1st attempt to place foley unsuccessful. Bulb will not inflate without causing pain to patient. Drained 200 ml from bladder . RN and PA notified.

## 2013-05-15 NOTE — Discharge Instructions (Signed)
Follow up with Dr. Karsten Ro tomorrow. Return to the ED for new or worsening symptoms.

## 2013-05-15 NOTE — Consult Note (Signed)
Urology Consult :  Cc: Dr. Kathie Rhodes Referring physician:   Netta Corrigan, PA, [facility] Reason for referral:  Urinary Retention, gross Hematuria 2245 Chief Complaint:   Urinary retention, recurrent  History of Present Illness:   [name] is a 80 yo mwm from [place], [place], post RRP for CaP in 1996-followed by EBRT in 1997. He developeda bladder neck contracture, released by Dr.Ottelin in 1997. He has since developed PSA recurrent disease, treated with  Lupron Rx. He has developed Hemorrhagic cystitis and proctitis, 2ndary to delayed Radiation effect. He has had recurrent urinary retention, and is now post cystoscopy today perDr. Karsten Ro in the office, but with subsequent AUR. He was seen in [facility], wit attempted foley cath placement yielding painful sensation when the baloon was inflated. 200cc of urine was removed from the bladder and the catheter was removed,and Urology has now been consulted.    Past Medical History  Diagnosis Date  . BPPV (benign paroxysmal positional vertigo)   . Hypertension   . Vitamin D deficiency   . Hypothyroidism   . Diverticulosis   . History of prostate cancer CURRENTLY  ELEVATED PSA--  LUPRON INJECTIONS    S/P PROSTATECTOMY AND RADIATION THERAPY  1997  . History of hemorrhagic cystitis     SECONDARY RADIATION THERAPY 1997  . BNC (bladder neck contracture)   . Foley catheter in place   . Radiation proctitis   . H/O diarrhea SECONDARY TO RADIATION THERAPY --  INTERMITANT  DIARRHEA  . Bladder spasm   . Radiation cystitis   . Hematuria    Past Surgical History  Procedure Laterality Date  . Prostatectomy  1997  . Colonoscopy w/ polypectomy  2005; 08/11/2010    2005: 1 cm hyperplastic sigmoid polyp, Dr. Wynetta Emery 2012: hyperplastic splenic flexure polyp -1 cm, diverticulosis, radiation proctitis, anal stenosis  . Cysto/ fulgeration of bleeders/ resection bladder neck contracture  07-06-2004    BNC AND RADIATION CYSTITIS  . Cataract extraction w/ intraocular lens  implant Right   . Lumbar disc surgery  1996    L4 -- L5  . Transurethral resection of bladder neck N/A 06/23/2012    Procedure: TRANSURETHRAL RESECTION OF BLADDER NECK CONTRACTURE WITH GYRUS;  Surgeon: Claybon Jabs, MD;  Location: [facility];  Service: Urology;  Laterality: N/A;    Medications: reviewed Allergies:  Allergies  Allergen Reactions  . Caffeine Shortness Of Breath and Palpitations  . Codeine Palpitations    Family History  Problem Relation Age of Onset  . Prostate cancer Brother    Social History:  reports that he quit smoking about 33 years ago. His smoking use included Cigarettes. He smoked 0.00 packs per day for 20 years. He quit smokeless tobacco use about 33 years ago. His smokeless tobacco use included Chew. He reports that he does not drink alcohol or use illicit drugs.  ROS: All systems are reviewed and negative except as noted. Blood at tip of glans.   Physical Exam:  Vital signs in last 24 hours: Temp:  [97.9 F (36.6 C)] 97.9 F (36.6 C) (04/21 2016) Pulse Rate:  [79] 79 (04/21 2016) Resp:  [18] 18 (04/21 2016) BP: (173)/(81) 173/81 mmHg (04/21 2016) SpO2:  [97 %] 97 % (04/21 2016) Weight:  [112.038 kg (247 lb)] 112.038 kg (247 lb) (04/21 2016)  Cardiovascular: Skin warm; not flushed Respiratory: Breaths quiet; no shortness of breath Abdomen: No masses Neurological: Normal sensation to touch Musculoskeletal: Normal motor function arms and legs Lymphatics: No inguinal adenopathy  Skin: No rashes Genitourinary:bloody glans. Scrotum normal.   Laboratory Data:  Results for orders placed during the hospital encounter of 05/15/13 (from the past 72 hour(s))  URINALYSIS, ROUTINE W REFLEX MICROSCOPIC     Status: Abnormal   Collection Time    05/15/13  9:17 PM      Result Value Ref Range   Color, Urine BROWN (*) YELLOW   Comment: BIOCHEMICALS MAY BE AFFECTED BY COLOR   APPearance TURBID (*) CLEAR   Specific Gravity, Urine 1.021  1.005  - 1.030   pH 6.0  5.0 - 8.0   Glucose, UA NEGATIVE  NEGATIVE mg/dL   Hgb urine dipstick LARGE (*) NEGATIVE   Bilirubin Urine MODERATE (*) NEGATIVE   Ketones, ur 40 (*) NEGATIVE mg/dL   Protein, ur >300 (*) NEGATIVE mg/dL   Urobilinogen, UA 0.2  0.0 - 1.0 mg/dL   Nitrite POSITIVE (*) NEGATIVE   Leukocytes, UA SMALL (*) NEGATIVE  URINE MICROSCOPIC-ADD ON     Status: Abnormal   Collection Time    05/15/13  9:17 PM      Result Value Ref Range   WBC, UA FIELD OBSCURED BY RBC'S  <3 WBC/hpf   RBC / HPF TOO NUMEROUS TO COUNT  <3 RBC/hpf   Bacteria, UA FIELD OBSCURED BY RBC'S (*) RARE   Urine-Other URINALYSIS PERFORMED ON SUPERNATANT     Comment: MICROSCOPIC EXAM PERFORMED ON UNCONCENTRATED URINE   No results found for this or any previous visit (from the past 240 hour(s)). Creatinine: No results found for this basename: CREATININE,  in the last 168 hours  Xrays: See report/chart   Impression/Assessment:  Acute urinary retention in pt with probable urethral trauma from foley catheter attempts, in addition to hemorrhagic radiation cystitis and proctitis. He will need cystoscopy and foley catheterization.   Plan:  Flex cystoscopy and foley catheterization.   [name] 05/15/2013, 10:41 PM

## 2013-05-15 NOTE — ED Notes (Signed)
Pt states that he began having trouble with urinary retention this am; pt states that he was seen at [facility] around 2pm; pt states that he was seen Dr Edwena Blow and after a cath was able to urinate; pt states that since 6pm he has only been able to urinate a few drops of blood; pt c/o bladder discomfort and feeling the urge to go the bathroom

## 2013-05-15 NOTE — ED Notes (Signed)
Unable to insert foley. PA aware.

## 2013-05-16 ENCOUNTER — Emergency Department (HOSPITAL_COMMUNITY)
Admission: EM | Admit: 2013-05-16 | Discharge: 2013-05-16 | Disposition: A | Discharge status: Home / Self Care | Payer: Medicare Other | Attending: Emergency Medicine | Admitting: Emergency Medicine

## 2013-05-16 ENCOUNTER — Encounter (HOSPITAL_COMMUNITY): Payer: Self-pay | Admitting: Emergency Medicine

## 2013-05-16 DIAGNOSIS — R339 Retention of urine, unspecified: Secondary | ICD-10-CM | POA: Diagnosis not present

## 2013-05-16 DIAGNOSIS — Z8546 Personal history of malignant neoplasm of prostate: Secondary | ICD-10-CM | POA: Diagnosis not present

## 2013-05-16 DIAGNOSIS — R319 Hematuria, unspecified: Secondary | ICD-10-CM | POA: Diagnosis not present

## 2013-05-16 DIAGNOSIS — R109 Unspecified abdominal pain: Secondary | ICD-10-CM | POA: Insufficient documentation

## 2013-05-16 DIAGNOSIS — Z8669 Personal history of other diseases of the nervous system and sense organs: Secondary | ICD-10-CM | POA: Insufficient documentation

## 2013-05-16 DIAGNOSIS — Z87891 Personal history of nicotine dependence: Secondary | ICD-10-CM | POA: Diagnosis not present

## 2013-05-16 DIAGNOSIS — Z79899 Other long term (current) drug therapy: Secondary | ICD-10-CM | POA: Diagnosis not present

## 2013-05-16 DIAGNOSIS — E559 Vitamin D deficiency, unspecified: Secondary | ICD-10-CM | POA: Diagnosis not present

## 2013-05-16 DIAGNOSIS — Z8719 Personal history of other diseases of the digestive system: Secondary | ICD-10-CM | POA: Insufficient documentation

## 2013-05-16 DIAGNOSIS — Z9889 Other specified postprocedural states: Secondary | ICD-10-CM | POA: Diagnosis not present

## 2013-05-16 DIAGNOSIS — Z7982 Long term (current) use of aspirin: Secondary | ICD-10-CM | POA: Diagnosis not present

## 2013-05-16 DIAGNOSIS — E039 Hypothyroidism, unspecified: Secondary | ICD-10-CM | POA: Diagnosis not present

## 2013-05-16 DIAGNOSIS — I10 Essential (primary) hypertension: Secondary | ICD-10-CM | POA: Diagnosis not present

## 2013-05-16 LAB — COMPREHENSIVE METABOLIC PANEL
ALT: 13 U/L (ref 0–53)
AST: 21 U/L (ref 0–37)
Albumin: 3.8 g/dL (ref 3.5–5.2)
Alkaline Phosphatase: 61 U/L (ref 39–117)
BILIRUBIN TOTAL: 0.3 mg/dL (ref 0.3–1.2)
BUN: 21 mg/dL (ref 6–23)
CALCIUM: 9.7 mg/dL (ref 8.4–10.5)
CHLORIDE: 101 meq/L (ref 96–112)
CO2: 26 meq/L (ref 19–32)
CREATININE: 1.22 mg/dL (ref 0.50–1.35)
GFR, EST AFRICAN AMERICAN: 64 mL/min — AB (ref >90)
GFR, EST NON AFRICAN AMERICAN: 55 mL/min — AB (ref >90)
GLUCOSE: 118 mg/dL — AB (ref 70–99)
Potassium: 4 mEq/L (ref 3.7–5.3)
SODIUM: 138 meq/L (ref 137–147)
Total Protein: 7 g/dL (ref 6.0–8.3)

## 2013-05-16 LAB — CBC WITH DIFFERENTIAL/PLATELET
Basophils Absolute: 0 10*3/uL (ref 0.0–0.1)
Basophils Relative: 0 % (ref 0–1)
Eosinophils Absolute: 0.2 10*3/uL (ref 0.0–0.7)
Eosinophils Relative: 3 % (ref 0–5)
HCT: 42.7 % (ref 39.0–52.0)
Hemoglobin: 14.5 g/dL (ref 13.0–17.0)
Lymphocytes Relative: 18 % (ref 12–46)
Lymphs Abs: 1.3 10*3/uL (ref 0.7–4.0)
MCH: 31.4 pg (ref 26.0–34.0)
MCHC: 34 g/dL (ref 30.0–36.0)
MCV: 92.4 fL (ref 78.0–100.0)
Monocytes Absolute: 0.7 10*3/uL (ref 0.1–1.0)
Monocytes Relative: 9 % (ref 3–12)
Neutro Abs: 5.1 10*3/uL (ref 1.7–7.7)
Neutrophils Relative %: 70 % (ref 43–77)
Platelets: 180 10*3/uL (ref 150–400)
RBC: 4.62 MIL/uL (ref 4.22–5.81)
RDW: 13.2 % (ref 11.5–15.5)
WBC: 7.4 10*3/uL (ref 4.0–10.5)

## 2013-05-16 LAB — URINALYSIS, ROUTINE W REFLEX MICROSCOPIC
GLUCOSE, UA: NEGATIVE mg/dL
Nitrite: POSITIVE — AB
PH: 6.5 (ref 5.0–8.0)
Specific Gravity, Urine: 1.02 (ref 1.005–1.030)
Urobilinogen, UA: 1 mg/dL (ref 0.0–1.0)

## 2013-05-16 LAB — PROTIME-INR
INR: 1.04 (ref 0.00–1.49)
Prothrombin Time: 13.4 seconds (ref 11.6–15.2)

## 2013-05-16 LAB — URINE MICROSCOPIC-ADD ON

## 2013-05-16 MED ORDER — CIPROFLOXACIN HCL 500 MG PO TABS: 500.0000 mg | ORAL_TABLET | Freq: Two times a day (BID) | ORAL | Status: DC

## 2013-05-16 NOTE — ED Notes (Signed)
Flushed pt's catheter until return clear and no more clots remain. Flushed with approx 528ml NS. Pt states bladder cramps have resolved.

## 2013-05-16 NOTE — ED Notes (Signed)
Pt states pain is better. Has no pain and has not had any spasms since irrigated. Nad.

## 2013-05-16 NOTE — Discharge Instructions (Signed)
Hematuria, Adult Follow up with Dr. Karsten Ro tomorrow. Keep the catheter clean as instructed. Return to the ED if you develop new or worsening symptoms. Hematuria is blood in your urine. It can be caused by a bladder infection, kidney infection, prostate infection, kidney stone, or cancer of your urinary tract. Infections can usually be treated with medicine, and a kidney stone usually will pass through your urine. If neither of these is the cause of your hematuria, further workup to find out the reason may be needed. It is very important that you tell your health care provider about any blood you see in your urine, even if the blood stops without treatment or happens without causing pain. Blood in your urine that happens and then stops and then happens again can be a symptom of a very serious condition. Also, pain is not a symptom in the initial stages of many urinary cancers. HOME CARE INSTRUCTIONS   Drink lots of fluid, 3 4 quarts a day. If you have been diagnosed with an infection, cranberry juice is especially recommended, in addition to large amounts of water.  Avoid caffeine, tea, and carbonated beverages, because they tend to irritate the bladder.  Avoid alcohol because it may irritate the prostate.  Only take over-the-counter or prescription medicines for pain, discomfort, or fever as directed by your health care provider.  If you have been diagnosed with a kidney stone, follow your health care provider's instructions regarding straining your urine to catch the stone.  Empty your bladder often. Avoid holding urine for long periods of time.  After a bowel movement, women should cleanse front to back. Use each tissue only once.  Empty your bladder before and after sexual intercourse if you are a male. SEEK MEDICAL CARE IF: You develop back pain, fever, a feeling of sickness in your stomach (nausea), or vomiting or if your symptoms are not better in 3 days. Return sooner if you are  getting worse. SEEK IMMEDIATE MEDICAL CARE IF:   You have a persistent fever, with a temperature of 101.74F (38.8C) or greater.  You develop severe vomiting and are unable to keep the medicine down.  You develop severe back or abdominal pain despite taking your medicines.  You begin passing a large amount of blood or clots in your urine.  You feel extremely weak or faint, or you pass out. MAKE SURE YOU:   Understand these instructions.  Will watch your condition.  Will get help right away if you are not doing well or get worse. Document Released: 01/11/2005 Document Revised: 11/01/2012 Document Reviewed: 09/11/2012 [facility] Patient Information 2014 [place].

## 2013-05-16 NOTE — ED Notes (Signed)
Cath irrigated with approx 65ml several clots returned. Bag had approx 152ml after irrigated. Not as bloody but obvious blood still present. Irrigated again with 50 ml. Urine getting lighter but still bloody.

## 2013-05-16 NOTE — ED Notes (Signed)
Pt c/o hematuria that began today. Pt has foley catheter placed yesterday due to urinary retention.

## 2013-05-16 NOTE — ED Provider Notes (Signed)
Medical screening examination/treatment/procedure(s) were conducted as a shared visit with non-physician practitioner(s) and myself.  I personally evaluated the patient during the encounter.   EKG Interpretation None      Patient presents with urinary retention. Had a urologic procedure and scope earlier today. Blood noted at the urethral meatus. Nursing having difficulty passing urinary catheter. Given recent instrumentation and prior history of abnormal anatomy, urology consulted for foley catheter placement.  Merryl Hacker, MD 05/17/13 1500

## 2013-05-16 NOTE — ED Notes (Signed)
Pt alert & oriented x4, stable gait. Patient given discharge instructions, paperwork & prescription(s). Patient  instructed to stop at the registration desk to finish any additional paperwork. Patient verbalized understanding. Pt left department w/ no further questions. 

## 2013-05-16 NOTE — ED Provider Notes (Signed)
CSN: [id number]     Arrival date & time 05/16/13  1650 History  This chart was scribed for Ezequiel Essex, MD by Jenne Campus, ED Scribe. This patient was seen in room APA07/APA07 and the patient's care was started at 5:20 PM.     Chief Complaint  Patient presents with  . Hematuria     The history is provided by the patient. No language interpreter was used.   HPI Comments: [name] is a 80 y.o. male who presents to the Emergency Department complaining of suprapubic pain described as "bladder spasms" that he attributes to urinary retention due to blocked foley cath from blood clots that started at 3pm today. Pt decided to go to his urologist in office yesterday to address symptoms where he received cystoscope. He was told that a foley was not needed at the time and was made an appointment for next week for a re-exam. The symptoms continued to bother him and he later went to [place] last night where he had a catheter placed after cystocscope showed urethral irritation. He emptied out about 1000 ml of urine from his bladder with the initial insertion and has drained about 741ml of bloody urine twice since then. Pt states he has a history of catheters being placed for urinary retention, his last time was about 52yr ago. He denies any emesis, fever or testicle pain as associated symptoms.     Urologist is Dr Karsten Ro   Past Medical History  Diagnosis Date  . BPPV (benign paroxysmal positional vertigo)   . Hypertension   . Vitamin D deficiency   . Hypothyroidism   . Diverticulosis   . History of prostate cancer CURRENTLY  ELEVATED PSA--  LUPRON INJECTIONS    S/P PROSTATECTOMY AND RADIATION THERAPY  1997  . History of hemorrhagic cystitis     SECONDARY RADIATION THERAPY 1997  . BNC (bladder neck contracture)   . Foley catheter in place   . Radiation proctitis   . H/O diarrhea SECONDARY TO RADIATION THERAPY --  INTERMITANT  DIARRHEA  . Bladder spasm   . Radiation cystitis   .  Hematuria    Past Surgical History  Procedure Laterality Date  . Prostatectomy  1997  . Colonoscopy w/ polypectomy  2005; 08/11/2010    2005: 1 cm hyperplastic sigmoid polyp, Dr. Wynetta Emery 2012: hyperplastic splenic flexure polyp -1 cm, diverticulosis, radiation proctitis, anal stenosis  . Cysto/ fulgeration of bleeders/ resection bladder neck contracture  07-06-2004    BNC AND RADIATION CYSTITIS  . Cataract extraction w/ intraocular lens implant Right   . Lumbar disc surgery  1996    L4 -- L5  . Transurethral resection of bladder neck N/A 06/23/2012    Procedure: TRANSURETHRAL RESECTION OF BLADDER NECK CONTRACTURE WITH GYRUS;  Surgeon: Claybon Jabs, MD;  Location: [facility];  Service: Urology;  Laterality: N/A;   Family History  Problem Relation Age of Onset  . Prostate cancer Brother    History  Substance Use Topics  . Smoking status: Former Smoker -- 20 years    Types: Cigarettes    Quit date: 07/28/1979  . Smokeless tobacco: Former Systems developer    Types: [place] date: 07/28/1979     Comment: SMOKED AND CHEW TOBACCO FOR 20 YRS QUIT AGE 18  . Alcohol Use: No    Review of Systems  A complete 10 system review of systems was obtained and all systems are negative except as noted in the HPI  and PMH.    Allergies  Caffeine and Codeine  Home Medications   Prior to Admission medications   Medication Sig Start Date End Date Taking? Authorizing Provider  amLODipine (NORVASC) 5 MG tablet Take 1 tablet (5 mg total) by mouth every morning. 01/05/13   Mary-Margaret Hassell Done, FNP  aspirin 81 MG chewable tablet Chew 81 mg by mouth every morning.     Historical Provider, MD  calcium carbonate (OS-CAL) 600 MG TABS Take 600 mg by mouth every morning.    Historical Provider, MD  Cholecalciferol (VITAMIN D3) 5000 UNITS CAPS Take 1 capsule by mouth daily.    Historical Provider, MD  Coenzyme Q10-Red Yeast Rice 60-600 MG CAPS Take 2 capsules by mouth daily. THIS PILL ALSO HAS  100MG  NIACIN    Historical Provider, MD  diphenhydrAMINE (BENADRYL) 25 MG tablet Take 25 mg by mouth every 6 (six) hours as needed for itching.    Historical Provider, MD  FLAXSEED, LINSEED, PO Take 30 mLs by mouth every morning. GRINDS FLAX SEEDS AND PUTS IN 1/4 CUP OF WATER    Historical Provider, MD  Leuprolide Acetate, 6 Month, (LUPRON DEPOT) 45 MG injection Inject 45 mg into the muscle every 6 (six) months.     Historical Provider, MD  levothyroxine (SYNTHROID) 50 MCG tablet Take 1 tablet (50 mcg total) by mouth daily before breakfast. 01/08/13   Mary-Margaret Hassell Done, FNP  lisinopril (PRINIVIL,ZESTRIL) 20 MG tablet Take 1 tablet (20 mg total) by mouth every morning. 01/05/13   Mary-Margaret Hassell Done, FNP  Magnesium 300 MG CAPS Take 1 capsule by mouth daily.    Historical Provider, MD   Triage vitals: BP 191/79  Pulse 79  Temp(Src) 98.1 F (36.7 C) (Oral)  Resp 20  SpO2 95%  Physical Exam  Nursing note and vitals reviewed. Constitutional: He is oriented to person, place, and time. He appears well-developed and well-nourished. No distress.  HENT:  Head: Normocephalic and atraumatic.  Eyes: EOM are normal.  Neck: Neck supple. No tracheal deviation present.  Cardiovascular: Normal rate and regular rhythm.   Pulmonary/Chest: Effort normal and breath sounds normal. No respiratory distress.  Abdominal: Soft. There is no tenderness.  Suprapubic tenderness  Genitourinary:  Foley in place that is draining red urine, no testicular tenderness, chaperone present  Musculoskeletal: Normal range of motion.  Intact peripheral pulses, no peripheral edema  Neurological: He is alert and oriented to person, place, and time.  Skin: Skin is warm and dry.  Psychiatric: He has a normal mood and affect. His behavior is normal.    ED Course  Procedures (including critical care time)  DIAGNOSTIC STUDIES: Oxygen Saturation is 95% on RA, adequate by my interpretation.    COORDINATION OF CARE: 5:26  PM-Discussed treatment plan which includes urology consult, CBC panel, CMP and UA with pt at bedside and pt agreed to plan. Pt reports pain is improved with 50 mL saline flush. 100 mL obtained after cath was flushed. Unable to identify bladder on bedside U/S.  6:00 PM-Consult complete with Dr.  Matilde Sprang, Urology. Patient case explained and discussed. Call ended at 6:02 PM.  Labs Review Labs Reviewed  COMPREHENSIVE METABOLIC PANEL - Abnormal; Notable for the following:    Glucose, Bld 118 (*)    GFR calc non Af Amer 55 (*)    GFR calc Af Amer 64 (*)    All other components within normal limits  URINALYSIS, ROUTINE W REFLEX MICROSCOPIC - Abnormal; Notable for the following:    Color, Urine BROWN (*)  APPearance HAZY (*)    Hgb urine dipstick LARGE (*)    Bilirubin Urine SMALL (*)    Ketones, ur TRACE (*)    Protein, ur >300 (*)    Nitrite POSITIVE (*)    Leukocytes, UA TRACE (*)    All other components within normal limits  URINE MICROSCOPIC-ADD ON - Abnormal; Notable for the following:    Bacteria, UA MANY (*)    All other components within normal limits  URINE CULTURE  CBC WITH DIFFERENTIAL  PROTIME-INR    Imaging Review No results found.   EKG Interpretation None      MDM   Final diagnoses:  Urinary retention  Hematuria   patient with history of prostate cancer status post prostatectomy with ongoing issues of urinary retention. Seen yesterday for same. Decreased urine output today. Foley was placed by Dr. Gaynelle Arabian yesterday with difficulty.  Cystoscopy results per Dr. Gaynelle Arabian: Flexible bedside cystoscopy was accomplished, showing penduluous urethral trauma. Clot within the urethra was irrigated free of the urethra, but the cystoscope could not be passed more proximal thatn the bulbar urethra. The urethra appeared to have partial disruption, so I did not want to dilate or re-traumatize it. n 0.038 guidewire was passed through the bulbar urethral urethral  stricture, and into the bladder. A 16 F Council catheter was then passed into the bladder and urine drained. The urine was bloody. The pt felt a sense of relief.   On arrival, patient has no urine on a bladder scan. Foley irrigated from blood clots.  Foley irrigated by nursing staff with large return of blood clots. Cr 1.2. Draining well on recheck. Urine culture will be sent. Patient received shot of Rocephin yesterday.  Discussed with Dr. Matilde Sprang of urology who agrees with ongoing treatment) to followup with Dr. Karsten Ro tomorrow.  BP 191/79  Pulse 79  Temp(Src) 98.1 F (36.7 C) (Oral)  Resp 20  SpO2 95%    I personally performed the services described in this documentation, which was scribed in my presence. The recorded information has been reviewed and is accurate.      Ezequiel Essex, MD 05/17/13 1247

## 2013-05-18 LAB — URINE CULTURE
Colony Count: NO GROWTH
Culture: NO GROWTH

## 2013-05-22 DIAGNOSIS — R339 Retention of urine, unspecified: Secondary | ICD-10-CM | POA: Diagnosis not present

## 2013-05-22 DIAGNOSIS — N320 Bladder-neck obstruction: Secondary | ICD-10-CM | POA: Diagnosis not present

## 2013-05-23 ENCOUNTER — Other Ambulatory Visit: Payer: Self-pay | Admitting: Urology

## 2013-05-29 ENCOUNTER — Encounter (HOSPITAL_BASED_OUTPATIENT_CLINIC_OR_DEPARTMENT_OTHER): Payer: Self-pay | Admitting: *Deleted

## 2013-05-29 NOTE — Progress Notes (Signed)
NPO AFTER MN. ARRIVE AT 0600. NEEDS EKG.  CURRENT LAB RESULTS IN EPIC AND CHART.  WILL TAKE NORVASC AND SYNTHROID AM DOS W/ SIPS OF WATER.

## 2013-05-31 NOTE — Anesthesia Preprocedure Evaluation (Addendum)
Anesthesia Evaluation  Patient identified by MRN, date of birth, ID band Patient awake    Reviewed: Allergy & Precautions, H&P , NPO status , Patient's Chart, lab work & pertinent test results  Airway Mallampati: II TM Distance: >3 FB Neck ROM: Full    Dental no notable dental hx. (+) Teeth Intact, Dental Advisory Given   Pulmonary neg pulmonary ROS, former smoker,  breath sounds clear to auscultation  Pulmonary exam normal       Cardiovascular Exercise Tolerance: Good hypertension, Pt. on medications Rhythm:Regular Rate:Normal  ECG with inferior Q waves, reviewed.   Neuro/Psych negative neurological ROS  negative psych ROS   GI/Hepatic negative GI ROS, Neg liver ROS,   Endo/Other  Hypothyroidism   Renal/GU negative Renal ROS  negative genitourinary   Musculoskeletal negative musculoskeletal ROS (+)   Abdominal (+) + obese,   Peds negative pediatric ROS (+)  Hematology negative hematology ROS (+)   Anesthesia Other Findings   Reproductive/Obstetrics negative OB ROS                      Anesthesia Physical Anesthesia Plan  ASA: II  Anesthesia Plan: General   Post-op Pain Management:    Induction: Intravenous  Airway Management Planned: LMA  Additional Equipment:   Intra-op Plan:   Post-operative Plan: Extubation in OR  Informed Consent: I have reviewed the patients History and Physical, chart, labs and discussed the procedure including the risks, benefits and alternatives for the proposed anesthesia with the patient or authorized representative who has indicated his/her understanding and acceptance.   Dental advisory given  Plan Discussed with: CRNA and Anesthesiologist  Anesthesia Plan Comments: (LMA 5 on 06/23/12)       Anesthesia Quick Evaluation

## 2013-06-01 ENCOUNTER — Ambulatory Visit (HOSPITAL_BASED_OUTPATIENT_CLINIC_OR_DEPARTMENT_OTHER)
Admission: RE | Admit: 2013-06-01 | Discharge: 2013-06-01 | Disposition: A | Discharge status: Home / Self Care | Payer: Medicare Other | Source: Ambulatory Visit | Attending: Urology | Admitting: Urology

## 2013-06-01 ENCOUNTER — Encounter (HOSPITAL_BASED_OUTPATIENT_CLINIC_OR_DEPARTMENT_OTHER): Payer: Medicare Other | Admitting: Certified Registered"

## 2013-06-01 ENCOUNTER — Encounter (HOSPITAL_BASED_OUTPATIENT_CLINIC_OR_DEPARTMENT_OTHER)
Admission: RE | Disposition: A | Discharge status: Home / Self Care | Payer: Self-pay | Source: Ambulatory Visit | Attending: Urology

## 2013-06-01 ENCOUNTER — Other Ambulatory Visit: Payer: Self-pay

## 2013-06-01 ENCOUNTER — Ambulatory Visit (HOSPITAL_BASED_OUTPATIENT_CLINIC_OR_DEPARTMENT_OTHER): Payer: Medicare Other | Admitting: Certified Registered"

## 2013-06-01 ENCOUNTER — Encounter (HOSPITAL_BASED_OUTPATIENT_CLINIC_OR_DEPARTMENT_OTHER): Payer: Self-pay | Admitting: Anesthesiology

## 2013-06-01 DIAGNOSIS — E669 Obesity, unspecified: Secondary | ICD-10-CM | POA: Insufficient documentation

## 2013-06-01 DIAGNOSIS — E039 Hypothyroidism, unspecified: Secondary | ICD-10-CM | POA: Insufficient documentation

## 2013-06-01 DIAGNOSIS — I10 Essential (primary) hypertension: Secondary | ICD-10-CM | POA: Diagnosis not present

## 2013-06-01 DIAGNOSIS — Z923 Personal history of irradiation: Secondary | ICD-10-CM | POA: Diagnosis not present

## 2013-06-01 DIAGNOSIS — Z87891 Personal history of nicotine dependence: Secondary | ICD-10-CM | POA: Diagnosis not present

## 2013-06-01 DIAGNOSIS — N320 Bladder-neck obstruction: Secondary | ICD-10-CM | POA: Insufficient documentation

## 2013-06-01 DIAGNOSIS — Z6832 Body mass index (BMI) 32.0-32.9, adult: Secondary | ICD-10-CM | POA: Diagnosis not present

## 2013-06-01 DIAGNOSIS — Z8546 Personal history of malignant neoplasm of prostate: Secondary | ICD-10-CM | POA: Diagnosis not present

## 2013-06-01 HISTORY — DX: Retention of urine, unspecified: R33.9

## 2013-06-01 HISTORY — PX: TRANSURETHRAL RESECTION OF BLADDER TUMOR WITH GYRUS (TURBT-GYRUS): SHX6458

## 2013-06-01 SURGERY — TRANSURETHRAL RESECTION OF BLADDER TUMOR WITH GYRUS (TURBT-GYRUS)
Anesthesia: General | Site: Bladder

## 2013-06-01 MED ORDER — PROMETHAZINE HCL 25 MG/ML IJ SOLN
6.2500 mg | INTRAMUSCULAR | Status: DC | PRN
  Filled 2013-06-01: qty 1

## 2013-06-01 MED ORDER — TRIAMCINOLONE ACETONIDE 40 MG/ML IJ SUSP
INTRAMUSCULAR | Status: DC | PRN
  Administered 2013-06-01: 80 mg

## 2013-06-01 MED ORDER — DEXAMETHASONE SODIUM PHOSPHATE 4 MG/ML IJ SOLN
INTRAMUSCULAR | Status: DC | PRN
  Administered 2013-06-01: 10 mg via INTRAVENOUS

## 2013-06-01 MED ORDER — SODIUM CHLORIDE 0.9 % IR SOLN
Status: DC | PRN
  Administered 2013-06-01: 10000 mL

## 2013-06-01 MED ORDER — FENTANYL CITRATE 0.05 MG/ML IJ SOLN
INTRAMUSCULAR | Status: DC | PRN
  Administered 2013-06-01 (×4): 50 ug via INTRAVENOUS

## 2013-06-01 MED ORDER — FENTANYL CITRATE 0.05 MG/ML IJ SOLN
25.0000 ug | INTRAMUSCULAR | Status: DC | PRN
  Filled 2013-06-01: qty 1

## 2013-06-01 MED ORDER — PROPOFOL 10 MG/ML IV BOLUS
INTRAVENOUS | Status: DC | PRN
  Administered 2013-06-01: 200 mg via INTRAVENOUS

## 2013-06-01 MED ORDER — LIDOCAINE HCL 2 % EX GEL: CUTANEOUS | Status: DC | PRN

## 2013-06-01 MED ORDER — HYDROCODONE-ACETAMINOPHEN 10-325 MG PO TABS: 1.0000 | ORAL_TABLET | ORAL | Status: DC | PRN

## 2013-06-01 MED ORDER — BELLADONNA ALKALOIDS-OPIUM 16.2-60 MG RE SUPP
RECTAL | Status: AC
Start: 2013-06-01 — End: 2013-06-01
  Filled 2013-06-01: qty 1

## 2013-06-01 MED ORDER — LACTATED RINGERS IV SOLN
INTRAVENOUS | Status: DC
  Administered 2013-06-01: 07:00:00 via INTRAVENOUS
  Filled 2013-06-01: qty 1000

## 2013-06-01 MED ORDER — MIDAZOLAM HCL 2 MG/2ML IJ SOLN
INTRAMUSCULAR | Status: AC
  Filled 2013-06-01: qty 2

## 2013-06-01 MED ORDER — ONDANSETRON HCL 4 MG/2ML IJ SOLN
INTRAMUSCULAR | Status: DC | PRN
  Administered 2013-06-01: 4 mg via INTRAVENOUS

## 2013-06-01 MED ORDER — FENTANYL CITRATE 0.05 MG/ML IJ SOLN
INTRAMUSCULAR | Status: AC
  Filled 2013-06-01: qty 4

## 2013-06-01 MED ORDER — SULFAMETHOXAZOLE-TMP DS 800-160 MG PO TABS: 1.0000 | ORAL_TABLET | Freq: Two times a day (BID) | ORAL | Status: DC

## 2013-06-01 MED ORDER — LIDOCAINE HCL (CARDIAC) 20 MG/ML IV SOLN
INTRAVENOUS | Status: DC | PRN
  Administered 2013-06-01: 80 mg via INTRAVENOUS

## 2013-06-01 MED ORDER — CIPROFLOXACIN IN D5W 400 MG/200ML IV SOLN
400.0000 mg | INTRAVENOUS | Status: AC
  Administered 2013-06-01: 400 mg via INTRAVENOUS
  Filled 2013-06-01: qty 200

## 2013-06-01 SURGICAL SUPPLY — 38 items
BAG DRAIN URO-CYSTO SKYTR STRL (DRAIN) ×2 IMPLANT
BAG URINE DRAINAGE (UROLOGICAL SUPPLIES) IMPLANT
BAG URINE LEG 19OZ MD ST LTX (BAG) ×2 IMPLANT
BENZOIN TINCTURE PRP APPL 2/3 (GAUZE/BANDAGES/DRESSINGS) ×2 IMPLANT
BLADE SURG 15 STRL LF DISP TIS (BLADE) IMPLANT
BLADE SURG 15 STRL SS (BLADE)
CANISTER SUCT LVC 12 LTR MEDI- (MISCELLANEOUS) IMPLANT
CATH COUDE FOLEY 2W 5CC 20FR (CATHETERS) ×2 IMPLANT
CATH FOLEY 3WAY 20FR (CATHETERS) IMPLANT
CATH FOLEY 3WAY 30CC 22F (CATHETERS) IMPLANT
CLOTH BEACON ORANGE TIMEOUT ST (SAFETY) ×2 IMPLANT
DRAPE CAMERA CLOSED 9X96 (DRAPES) ×2 IMPLANT
ELECT BUTTON BIOP 24F 90D PLAS (MISCELLANEOUS) IMPLANT
ELECT LOOP HF 26F 30D .35MM (CUTTING LOOP) IMPLANT
ELECT REM PT RETURN 9FT ADLT (ELECTROSURGICAL) ×2
ELECT RESECT VAPORIZE 12D CBL (ELECTRODE) ×2 IMPLANT
ELECTRODE REM PT RTRN 9FT ADLT (ELECTROSURGICAL) ×1 IMPLANT
EVACUATOR MICROVAS BLADDER (UROLOGICAL SUPPLIES) IMPLANT
GLOVE BIO SURGEON STRL SZ7.5 (GLOVE) ×2 IMPLANT
GLOVE SURG SS PI 7.5 STRL IVOR (GLOVE) ×2 IMPLANT
GOWN STRL REIN XL XLG (GOWN DISPOSABLE) IMPLANT
GOWN STRL REUS W/TWL XL LVL3 (GOWN DISPOSABLE) ×4 IMPLANT
HOLDER FOLEY CATH W/STRAP (MISCELLANEOUS) IMPLANT
IV NS 1000ML (IV SOLUTION) ×1
IV NS 1000ML BAXH (IV SOLUTION) ×1 IMPLANT
IV NS IRRIG 3000ML ARTHROMATIC (IV SOLUTION) ×6 IMPLANT
KIT SUPRAPUBIC CATH (MISCELLANEOUS) IMPLANT
LOOP CUTTING 24FR OLYMPUS (CUTTING LOOP) IMPLANT
NEEDLE BLUNT 18X1 FOR OR ONLY (NEEDLE) ×4 IMPLANT
PACK CYSTOSCOPY (CUSTOM PROCEDURE TRAY) ×2 IMPLANT
PLUG CATH AND CAP STER (CATHETERS) IMPLANT
SET ASPIRATION TUBING (TUBING) IMPLANT
SUT ETHILON 3 0 PS 1 (SUTURE) IMPLANT
SYR 30ML LL (SYRINGE) IMPLANT
SYR 5ML LL (SYRINGE) ×2 IMPLANT
SYR TB 1ML LL NO SAFETY (SYRINGE) ×2 IMPLANT
SYRINGE IRR TOOMEY STRL 70CC (SYRINGE) ×2 IMPLANT
WATER STERILE IRR 3000ML UROMA (IV SOLUTION) IMPLANT

## 2013-06-01 NOTE — Op Note (Signed)
PATIENT:  [name]  PRE-OPERATIVE DIAGNOSIS: Bladder neck contracture  POST-OPERATIVE DIAGNOSIS: Same  PROCEDURE: 1. Transurethral incision of bladder neck contracture. 2. Injection of steroid.  SURGEON:  Claybon Jabs  INDICATION: [name] is a 80 year old male has had a radical prostatectomy in 1996. He developed a bladder neck contracture and underwent transurethral incision of his contracture and 6/06 and then had to have this repeated in 5/14. He redeveloped urinary retention and was found to have redeveloped a bladder neck contracture. He is brought to the operating room today for incision of his bladder neck contracture and we have discussed the fact that recurrent bladder neck contracture is his greatest risk and that I was going to inject steroid into the location of the incision of his bladder neck to hopefully decrease the risk of recurrence. He had been taking nitrofurantoin preoperatively at home and also received Cipro 400 mg IV this morning.  ANESTHESIA:  General  EBL:  Minimal  DRAINS: 20 French Foley catheter  LOCAL MEDICATIONS USED:  None  SPECIMEN:  None  Description of procedure: After informed consent the patient was taken to the operating room and placed on the table in a supine position. General anesthesia was then administered. Once fully anesthetized the patient was moved to the dorsal lithotomy position and the genitalia were sterilely prepped and draped in standard fashion. An official timeout was then performed.  I initially passed the 68 French resectoscope with visual obturator and 12 lens down the urethra which is noted be normal. There was fixation of the bladder neck region and I was able to pass the scope through the bladder neck contracture and into the bladder. There was telangiectasia involving the mucosa throughout the bladder consistent with his prior radiation the ureteral orifices were of normal position and configuration well away  from the bladder neck. There were no tumors, stones or worrisome lesions identified upon full inspection of the bladder.  I inserted the Gyrus button with 12 lens and incised the bladder neck at the 12:00, 6:00, 3:00 and 9:00 positions. This opened up the bladder neck nicely. At the 9:00 position as I was incising the bladder neck I encountered a surgical clip. This was grasped with alligator forceps and removed. I then used electrocautery to fulgurate bleeding points.  I removed the resectoscope and inserted the injection cystoscope which allowed me to inject a total of 2 cc of 40 mg per cc Kenalog into the bladder neck incision locations at the 9:00, 3:00 and 6:00 positions. Due to fixation of the bladder neck I was not able to inject at the 12:00 position. Reinspection revealed no significant bleeding. I therefore removed the cystoscope after draining the bladder and inserted a 20 French Foley catheter. This was filled with 10 cc and placed on mild traction. I then irrigated the catheter and the irrigant returned clear. It was connected to closed system drainage and the patient was awakened and taken to the recovery room in stable and satisfactory condition. He tolerated the procedure well and there were no intraoperative complications.  I will have him maintain his Foley catheter for the next 48 hours and then remove the catheter at home. He will be maintained on Septra DS for 5 days postoperatively as well.   PLAN OF CARE: Discharge to home after PACU  PATIENT DISPOSITION:  PACU - hemodynamically stable.

## 2013-06-01 NOTE — Anesthesia Postprocedure Evaluation (Signed)
  Anesthesia Post-op Note  Patient: [name]  Procedure(s) Performed: Procedure(s) (LRB): TRANSURETHRAL RESECTION OF BLADDER NECK CONTRACTURE WITH GYRUS (TURBT-GYRUS) AND INJECTION OF KENALOG (N/A)  Patient Location: PACU  Anesthesia Type: General  Level of Consciousness: awake and alert   Airway and Oxygen Therapy: Patient Spontanous Breathing  Post-op Pain: mild  Post-op Assessment: Post-op Vital signs reviewed, Patient's Cardiovascular Status Stable, Respiratory Function Stable, Patent Airway and No signs of Nausea or vomiting  Last Vitals:  Filed Vitals:   06/01/13 0930  BP: 171/65  Pulse: 66  Temp:   Resp: 14    Post-op Vital Signs: stable   Complications: No apparent anesthesia complications

## 2013-06-01 NOTE — H&P (Signed)
[name] is a 80 year old male with a bladder neck contracture and retention.   History of Present Illness   Diagnosis: Pathologic stage, T3a, N0, MX prostate cancer with positive surgical margins, with extracapsular extension  Date of diagnosis: 1996  Type of treatment:  Retropubic Radical Prostatectomy  Date of treatment: 04/1994  Pretreatment PSA:   Gleason score: 8-9  Biochemical recurrence: 1998  Staging Studies: Bone Scan 09/2007:, CT of the abdomen/pelvis 09/2007:.  Prostate Biopsies: Positive prostate biopsy (03/1994).      In 3/99 he underwent XRT for a PSA recurrence.     He began hormone ablation therapy with Degarelix on the CS 35 study protocol which began 11/09. He had been receiving Degarelix every 3 months. This resulted in a fall of his PSA and after completing the study his PSA began to rise slowly. He is was restarted on Lupron in 11/12 and has remained on that. He has developed hot flashes but they're mild and he does not want to try any medication for it.  Urinary function: He denies incontinence.  Erectile function: He does complain of erectile dysfunction.  Bowel function: He complains of fecal urgency, but denies any bowel related symptoms.  Other symptoms: no recent weight loss, no new bone or joint pain     Bone health: His DEXA scan 5/14 revealed a T score of -1.2 at the femoral neck and a T score of 1.3 total 4 views lumbar spine placing him at a very low risk of a SRE.  Current treatment: Weightbearing exercise, vitamin D and calcium.      He was found in 2/09 to have what appeared to be a Bosniak class IIF cyst of the left kidney which has been followed with serial imaging studies. No significant changes have been noted.    Bladder neck contracture: He developed a bladder neck contracture and required a transurethral incision of the contracture in 6/06. He then developed a recurrence and underwent a second transurethral resection of his bladder neck in  5/14.    Interval history: When I saw him recently he told me he had experienced a couple of episodes of gross hematuria that only lasted a short time. He then developed gross hematuria and was unable to urinate. Cystoscopically I found recurrent bladder neck contracture which I dilated him because he did not appear to be actively bleeding left a catheter out he developed retention and required a catheter to be placed in the ER. Since that time his urine has cleared up and remains clear at this time.    Past Medical History Problems  1. History of Bladder neck contracture (596.0) 2. History of Flushing (782.62) 3. History of hypercholesterolemia (V12.29) 4. History of Male stress incontinence (166.06)  Surgical History Problems  1. History of Back Surgery 2. History of Biopsy Of The Prostate Needle 3. History of Cystoscopy For Urethral Stricture 4. History of Cystoscopy With Fulguration 5. History of Prostatect Retropubic Radical W/ Bilat Pelv Lymphadenectomy 6. History of Radiation Therapy 7. History of Transurethral Resection Of Bladder Neck  Current Meds 1. AmLODIPine Besylate 5 MG Oral Tablet;  Therapy: (Recorded:15May2012) to Recorded 2. Aspirin 325 MG Oral Tablet;  Therapy: (Recorded:21Aug2008) to Recorded 3. Calcium 600 TABS;  Therapy: (Recorded:15May2012) to Recorded 4. Flax Seed Oil CAPS;  Therapy: (Recorded:27Aug2009) to Recorded 5. Levothyroxine Sodium 25 MCG Oral Tablet;  Therapy: (Recorded:20May2014) to Recorded 6. Lisinopril 20 MG Oral Tablet;  Therapy: (Recorded:15May2012) to Recorded 7. Lupron Depot 45 MG Intramuscular Kit; INJECT  45  MG Intramuscular; To Be Done:  [id number]; Status: HOLD FOR - Administration Ordered 8. Magnesium TABS;  Therapy: (Recorded:15May2012) to Recorded 9. Niacin TABS; niacin- red yeast rice- co q 10 all in one tab;  Therapy: (Recorded:21Apr2015) to Recorded 10. Vitamin D3 CAPS;   Therapy: (Recorded:15May2012) to  Recorded  Allergies Medication  1. Codeine Derivatives  Family History Problems  1. Family history of Congestive Heart Failure : Mother 2. Family history of Prostate Cancer (N99.87) : Brother  Social History Problems  1. Caffeine Use   1 2. Former smoker (V15.82) 3. Marital History - Currently Married 4. History of Tobacco Use (V15.82)   quit 40 years ago; smoked one pack per day for 20 years  Review of Systems Genitourinary, constitutional, skin, eye, otolaryngeal, hematologic/lymphatic, cardiovascular, pulmonary, endocrine, musculoskeletal, gastrointestinal, neurological and psychiatric system(s) were reviewed and pertinent findings if present are noted.  Vitals Vital Signs  Height: 6 ft 1 in Weight: 245 lb  BMI Calculated: 32.32 BSA Calculated: 2.35 Blood Pressure: 159 / 80 Temperature: 97.4 F Heart Rate: 81    Physical Exam Constitutional: Well nourished and well developed . No acute distress.  ENT:. The ears and nose are normal in appearance.  Neck: The appearance of the neck is normal and no neck mass is present.  Pulmonary: No respiratory distress and normal respiratory rhythm and effort.  Cardiovascular: Heart rate and rhythm are normal . No peripheral edema.  Abdomen: The abdomen is soft and nontender.  Midline scar is noted.  No masses are palpated. No CVA tenderness. No hernias are palpable. No hepatosplenomegaly noted.  Rectal: Rectal exam demonstrates normal sphincter tone, no tenderness and no masses. The prostate and seminal vesicles are surgically absent. Genitourinary: Examination of the penis demonstrates no discharge with a Foley catheter in place draining clear urine, no masses. The scrotum is without lesions. The right epididymis is palpably normal and non-tender. The left epididymis is palpably normal and non-tender. The right testis is non-tender and without masses. The left testis is non-tender and without masses.  Lymphatics: The femoral and  inguinal nodes are not enlarged or tender.  Skin: Normal skin turgor, no visible rash and no visible skin lesions.  Neuro/Psych:. Mood and affect are appropriate.    Impression: He is a bladder neck contracture and although I dilated him in the office and felt he would remain open he redeveloped urinary retention and had a Foley catheter reinserted.  We therefore discussed proceeding with a formal transurethral resection of his bladder neck contracture.  He does already have some degree of stress incontinence.  He understands that this could worsen slightly with this procedure.  In addition I placed him on antibiotics to be taken the weekpreceding his planned procedure.  We went over the procedure again discussing the potential risks and complications, the alternatives, the outpatient nature of the procedure as well as probability of success and anticipated postoperative course.  He understands and is elected to proceed.  Plan:  1.  Nitrofurantoin b.i.d. until the day of surgery. 2.  Continue Foley catheter. 3.  He is scheduled for transurethral resection/dilatation of his bladder neck contracture.

## 2013-06-01 NOTE — Transfer of Care (Signed)
Immediate Anesthesia Transfer of Care Note  Patient: [name]  Procedure(s) Performed: Procedure(s) (LRB): TRANSURETHRAL RESECTION OF BLADDER NECK CONTRACTURE WITH GYRUS (TURBT-GYRUS) AND INJECTION OF KENALOG (N/A)  Patient Location: PACU  Anesthesia Type: General  Level of Consciousness: awake, alert  and oriented  Airway & Oxygen Therapy: Patient Spontanous Breathing and Patient connected to face mask oxygen  Post-op Assessment: Report given to PACU RN and Post -op Vital signs reviewed and stable  Post vital signs: Reviewed and stable  Complications: No apparent anesthesia complications

## 2013-06-01 NOTE — Discharge Instructions (Signed)
Post Bladder Surgery Instructions   General instructions:     Your recent bladder surgery requires very little post hospital care but some definite precautions.  Despite the fact that no skin incisions were used, the area around the bladder incisions are raw and covered with scabs to promote healing and prevent bleeding. Certain precautions are needed to insure that the scabs are not disturbed over the next 2-4 weeks while the healing proceeds.  Because the raw surface inside your bladder and the irritating effects of urine you may expect frequency of urination and/or urgency (a stronger desire to urinate) and perhaps even getting up at night more often. This will usually resolve or improve slowly over the healing period. You may see some blood in your urine over the first 6 weeks. Do not be alarmed, even if the urine was clear for a while. Get off your feet and drink lots of fluids until clearing occurs. If you start to pass clots or don't improve call us.  Catheter: (If you are discharged with a catheter.)  1. Keep your catheter secured to your leg at all times with tape or the supplied strap. 2. You may experience leakage of urine around your catheter- as long as the  catheter continues to drain, this is normal.  If your catheter stops draining  go to the ER. 3. You may also have blood in your urine, even after it has been clear for  several days; you may even pass some small blood clots or other material.  This  is normal as well.  If this happens, sit down and drink plenty of water to help  make urine to flush out your bladder.  If the blood in your urine becomes worse  after doing this, contact our office or return to the ER. 4. You may use the leg bag (small bag) during the day, but use the large bag at  night.  Diet:  You may return to your normal diet immediately. Because of the raw surface of your bladder, alcohol, spicy foods, foods high in acid and drinks with caffeine may  cause irritation or frequency and should be used in moderation. To keep your urine flowing freely and avoid constipation, drink plenty of fluids during the day (8-10 glasses). Tip: Avoid cranberry juice because it is very acidic.  Activity:  Your physical activity doesn't need to be restricted. However, if you are very active, you may see some blood in the urine. We suggest that you reduce your activity under the circumstances until the bleeding has stopped.  Bowels:  It is important to keep your bowels regular during the postoperative period. Straining with bowel movements can cause bleeding. A bowel movement every other day is reasonable. Use a mild laxative if needed, such as milk of magnesia 2-3 tablespoons, or 2 Dulcolax tablets. Call if you continue to have problems. If you had been taking narcotics for pain, before, during or after your surgery, you may be constipated. Take a laxative if necessary.    Medication:  You should resume your pre-surgery medications unless told not to. In addition you may be given an antibiotic to prevent or treat infection. Antibiotics are not always necessary. All medication should be taken as prescribed until the bottles are finished unless you are having an unusual reaction to one of the drugs.  Remove catheter in 48 hours   Post Anesthesia Home Care Instructions  Activity: Get plenty of rest for the remainder of the day. A responsible  adult should stay with you for 24 hours following the procedure.  For the next 24 hours, DO NOT: -Drive a car -Paediatric nurse -Drink alcoholic beverages -Take any medication unless instructed by your physician -Make any legal decisions or sign important papers.  Meals: Start with liquid foods such as gelatin or soup. Progress to regular foods as tolerated. Avoid greasy, spicy, heavy foods. If nausea and/or vomiting occur, drink only clear liquids until the nausea and/or vomiting subsides. Call your physician if  vomiting continues.  Special Instructions/Symptoms: Your throat may feel dry or sore from the anesthesia or the breathing tube placed in your throat during surgery. If this causes discomfort, gargle with warm salt water. The discomfort should disappear within 24 hours.

## 2013-06-01 NOTE — Anesthesia Procedure Notes (Signed)
Procedure Name: LMA Insertion Date/Time: 06/01/2013 7:40 AM Performed by: Wanita Chamberlain Pre-anesthesia Checklist: Patient identified, Timeout performed and Emergency Drugs available Patient Re-evaluated:Patient Re-evaluated prior to inductionOxygen Delivery Method: Circle system utilized Preoxygenation: Pre-oxygenation with 100% oxygen Intubation Type: IV induction Ventilation: Mask ventilation without difficulty LMA: LMA inserted LMA Size: 5.0 Number of attempts: 1 Airway Equipment and Method: Bite block Placement Confirmation: positive ETCO2 Tube secured with: Tape

## 2013-06-04 ENCOUNTER — Encounter (HOSPITAL_BASED_OUTPATIENT_CLINIC_OR_DEPARTMENT_OTHER): Payer: Self-pay | Admitting: Urology

## 2013-06-06 DIAGNOSIS — R3989 Other symptoms and signs involving the genitourinary system: Secondary | ICD-10-CM | POA: Diagnosis not present

## 2013-06-06 DIAGNOSIS — R32 Unspecified urinary incontinence: Secondary | ICD-10-CM | POA: Diagnosis not present

## 2013-06-13 DIAGNOSIS — C61 Malignant neoplasm of prostate: Secondary | ICD-10-CM | POA: Diagnosis not present

## 2013-06-20 DIAGNOSIS — N320 Bladder-neck obstruction: Secondary | ICD-10-CM | POA: Diagnosis not present

## 2013-06-20 DIAGNOSIS — C61 Malignant neoplasm of prostate: Secondary | ICD-10-CM | POA: Diagnosis not present

## 2013-06-20 DIAGNOSIS — R32 Unspecified urinary incontinence: Secondary | ICD-10-CM | POA: Diagnosis not present

## 2013-07-06 ENCOUNTER — Ambulatory Visit: Payer: Medicare Other | Admitting: Nurse Practitioner

## 2013-07-09 ENCOUNTER — Ambulatory Visit (INDEPENDENT_AMBULATORY_CARE_PROVIDER_SITE_OTHER): Payer: Medicare Other | Admitting: Nurse Practitioner

## 2013-07-09 ENCOUNTER — Encounter: Payer: Self-pay | Admitting: Nurse Practitioner

## 2013-07-09 VITALS — BP 148/88 | HR 68 | Temp 98.0°F | Ht 73.0 in | Wt 246.4 lb

## 2013-07-09 DIAGNOSIS — E039 Hypothyroidism, unspecified: Secondary | ICD-10-CM | POA: Diagnosis not present

## 2013-07-09 DIAGNOSIS — I10 Essential (primary) hypertension: Secondary | ICD-10-CM

## 2013-07-09 DIAGNOSIS — E785 Hyperlipidemia, unspecified: Secondary | ICD-10-CM

## 2013-07-09 MED ORDER — AMLODIPINE BESYLATE 5 MG PO TABS: 5.0000 mg | ORAL_TABLET | Freq: Every morning | ORAL | Status: DC

## 2013-07-09 MED ORDER — LISINOPRIL 20 MG PO TABS: 20.0000 mg | ORAL_TABLET | Freq: Every morning | ORAL | Status: DC

## 2013-07-09 MED ORDER — INCONTINENCE BRIEF LARGE MISC: 1.0000 | Freq: Every day | Status: DC

## 2013-07-09 NOTE — Patient Instructions (Signed)
Urinary Incontinence Urinary incontinence is the involuntary loss of urine from your bladder. CAUSES  There are many causes of urinary incontinence. They include:  Medicines.  Infections.  Prostatic enlargement, leading to overflow of urine from your bladder.  Surgery.  Neurological diseases.  Emotional factors. SIGNS AND SYMPTOMS Urinary Incontinence can be divided into four types: 1. Urge incontinence. Urge incontinence is the involuntary loss of urine before you have the opportunity to go to the bathroom. There is a sudden urge to void but not enough time to reach a bathroom. 2. Stress incontinence. Stress incontinence is the sudden loss of urine with any activity that forces urine to pass. It is commonly caused by anatomical changes to the pelvis and sphincter areas of your body. 3. Overflow incontinence. Overflow incontinence is the loss of urine from an obstructed opening to your bladder. This results in a backup of urine and a resultant buildup of pressure within the bladder. When the pressure within the bladder exceeds the closing pressure of the sphincter, the urine overflows, which causes incontinence, similar to water overflowing a dam. 4. Total incontinence. Total incontinence is the loss of urine as a result of the inability to store urine within your bladder. DIAGNOSIS  Evaluating the cause of incontinence may require:  A thorough and complete medical and obstetric history.  A complete physical exam.  Laboratory tests such as a urine culture and sensitivities. When additional tests are indicated, they can include:  An ultrasound exam.  Kidney and bladder X-rays.  Cystoscopy. This is an exam of the bladder using a narrow scope.  Urodynamic testing to test the nerve function to the bladder and sphincter areas. TREATMENT  Treatment for urinary incontinence depends on the cause:  For urge incontinence caused by a bacterial infection, antibiotics will be prescribed.  If the urge incontinence is related to medicines you take, your health care provider may have you change the medicine.  For stress incontinence, surgery to re-establish anatomical support to the bladder or sphincter, or both, will often correct the condition.  For overflow incontinence caused by an enlarged prostate, an operation to open the channel through the enlarged prostate will allow the flow of urine out of the bladder. In women with fibroids, a hysterectomy may be recommended.  For total incontinence, surgery on your urinary sphincter may help. An artificial urinary sphincter (an inflatable cuff placed around the urethra) may be required. In women who have developed a hole-like passage between their bladder and vagina (vesicovaginal fistula), surgery to close the fistula often is required. HOME CARE INSTRUCTIONS  Normal daily hygiene and the use of pads or adult diapers that are changed regularly will help prevent odors and skin damage.  Avoid caffeine. It can overstimulate your bladder.  Use the bathroom regularly. Try about every 2 3 hours to go to the bathroom, even if you do not feel the need to do so. Take time to empty your bladder completely. After urinating, wait a minute. Then try to urinate again.  For causes involving nerve dysfunction, keep a log of the medicines you take and a journal of the times you go to the bathroom. SEEK MEDICAL CARE IF:  You experience worsening of pain instead of improvement in pain after your procedure.  Your incontinence becomes worse instead of better. SEE IMMEDIATE MEDICAL CARE IF:  You experience fever or shaking chills.  You are unable to pass your urine.  You have redness spreading into your groin or down into your thighs. MAKE  SURE YOU:   Understand these instructions.   Will watch your condition.  Will get help right away if you are not doing well or get worse. Document Released: 02/19/2004 Document Revised: 11/01/2012 Document  Reviewed: 06/20/2012 [facility] Patient Information 2014 [place].

## 2013-07-09 NOTE — Progress Notes (Signed)
Subjective:    Patient ID: [name], male    DOB: 1932-11-26, 80 y.o.   MRN: [id number]  Patient here today for follow up of chronic medical problems.  Hypertension This is a chronic problem. The current episode started more than 1 year ago. The problem has been resolved since onset. The problem is controlled (672-094'B systolic at home). Pertinent negatives include no blurred vision, chest pain, headaches, malaise/fatigue, palpitations, peripheral edema or shortness of breath. There are no associated agents to hypertension. Risk factors for coronary artery disease include dyslipidemia and male gender. Past treatments include ACE inhibitors and beta blockers. The current treatment provides significant improvement. There are no compliance problems.  Hypertensive end-organ damage includes a thyroid problem.  Hyperlipidemia This is a chronic problem. The current episode started more than 1 year ago. The problem is controlled. Recent lipid tests were reviewed and are normal. Exacerbating diseases include hypothyroidism. There are no known factors aggravating his hyperlipidemia. Pertinent negatives include no chest pain or shortness of breath. Current antihyperlipidemic treatment includes diet change. The current treatment provides moderate improvement of lipids. There are no compliance problems.  Risk factors for coronary artery disease include hypertension and male sex.  Thyroid Problem Presents for follow-up visit. Symptoms include fatigue (occassionally). Patient reports no dry skin, hair loss, heat intolerance, menstrual problem, palpitations, visual change, weight gain or weight loss. The symptoms have been stable. His past medical history is significant for hyperlipidemia.  Bladder blockage Just happened 1 month ago and had to have scar tissue removed from previous prostate surgery.- Doing much better.    Review of Systems  Constitutional: Positive for fatigue (occassionally). Negative for  weight loss, weight gain and malaise/fatigue.  Eyes: Negative for blurred vision.  Respiratory: Negative for shortness of breath.   Cardiovascular: Negative for chest pain and palpitations.  Endocrine: Negative for heat intolerance.  Genitourinary: Negative for menstrual problem.  Neurological: Negative for headaches.  All other systems reviewed and are negative.      Objective:   Physical Exam  Constitutional: He is oriented to person, place, and time. He appears well-developed and well-nourished.  HENT:  Head: Normocephalic.  Right Ear: External ear normal.  Left Ear: External ear normal.  Nose: Nose normal.  Mouth/Throat: Oropharynx is clear and moist.  Eyes: EOM are normal. Pupils are equal, round, and reactive to light.  Neck: Normal range of motion. Neck supple. No thyromegaly present.  Cardiovascular: Normal rate, regular rhythm, normal heart sounds and intact distal pulses.   No murmur heard. Pulmonary/Chest: Effort normal and breath sounds normal. He has no wheezes. He has no rales.  Abdominal: Soft. Bowel sounds are normal.  Musculoskeletal: Normal range of motion.  Neurological: He is alert and oriented to person, place, and time.  Skin: Skin is warm and dry.  Psychiatric: He has a normal mood and affect. His behavior is normal. Judgment and thought content normal.   BP 189/96  Pulse 68  Temp(Src) 98 F (36.7 C) (Oral)  Ht 6\' 1"  (1.854 m)  Wt 246 lb 6.4 oz (111.766 kg)  BMI 32.52 kg/m2        Assessment & Plan:  1. Hypertension *keep diary of blood pressure at home- patient says only elevated at Dr. office - COMPLETE METABOLIC PANEL WITH GFR - amLODipine (NORVASC) 5 MG tablet; Take 1 tablet (5 mg total) by mouth every morning.  Dispense: 90 tablet; Refill: 1 - lisinopril (PRINIVIL,ZESTRIL) 20 MG tablet; Take 1 tablet (20 mg total) by mouth  every morning.  Dispense: 90 tablet; Refill: 1  2. Hyperlipidemia Low fat diet an dexercise - NMR Lipoprofile with  Lipids  3. Hypothyroidism - Thyroid Panel With TSH - levothyroxine (SYNTHROID, LEVOTHROID) 25 MCG tablet; Take 1 tablet (25 mcg total) by mouth daily before breakfast.  Dispense: 90 tablet; Refill: 1  4. Urinary incontinence Depends rx  Mary-Margaret Hassell Done, FNP

## 2013-07-10 ENCOUNTER — Other Ambulatory Visit: Payer: Self-pay | Admitting: Nurse Practitioner

## 2013-07-10 ENCOUNTER — Other Ambulatory Visit (INDEPENDENT_AMBULATORY_CARE_PROVIDER_SITE_OTHER): Payer: Medicare Other

## 2013-07-10 DIAGNOSIS — R7989 Other specified abnormal findings of blood chemistry: Secondary | ICD-10-CM

## 2013-07-10 LAB — NMR, LIPOPROFILE
Cholesterol: 179 mg/dL (ref 100–199)
HDL Cholesterol by NMR: 55 mg/dL (ref >39)
HDL Particle Number: 39.3 umol/L (ref >=30.5)
LDL Particle Number: 1482 nmol/L — ABNORMAL HIGH (ref <1000)
LDL Size: 20.1 nm (ref >20.5)
LDLC SERPL CALC-MCNC: 95 mg/dL (ref 0–99)
LP-IR SCORE: 49 — AB (ref <=45)
SMALL LDL PARTICLE NUMBER: 876 nmol/L — AB (ref <=527)
Triglycerides by NMR: 144 mg/dL (ref 0–149)

## 2013-07-10 LAB — CMP14+EGFR
A/G RATIO: 2 (ref 1.1–2.5)
ALK PHOS: 57 IU/L (ref 39–117)
ALT: 14 IU/L (ref 0–44)
AST: 19 IU/L (ref 0–40)
Albumin: 4.5 g/dL (ref 3.5–4.8)
BUN/Creatinine Ratio: 15 (ref 10–22)
BUN: 13 mg/dL (ref 8–27)
CO2: 24 mmol/L (ref 18–29)
CREATININE: 0.89 mg/dL (ref 0.76–1.27)
Calcium: 9.9 mg/dL (ref 8.6–10.2)
Chloride: 102 mmol/L (ref 97–108)
GFR calc Af Amer: 94 mL/min/{1.73_m2} (ref >59)
GFR calc non Af Amer: 81 mL/min/{1.73_m2} (ref >59)
GLOBULIN, TOTAL: 2.2 g/dL (ref 1.5–4.5)
Glucose: 98 mg/dL (ref 65–99)
Potassium: 5.4 mmol/L — ABNORMAL HIGH (ref 3.5–5.2)
Sodium: 138 mmol/L (ref 134–144)
Total Bilirubin: 0.4 mg/dL (ref 0.0–1.2)
Total Protein: 6.7 g/dL (ref 6.0–8.5)

## 2013-07-10 LAB — THYROID PANEL WITH TSH
Free Thyroxine Index: 2.5 (ref 1.2–4.9)
T3 Uptake Ratio: 31 % (ref 24–39)
T4, Total: 8.2 ug/dL (ref 4.5–12.0)
TSH: 4.28 u[IU]/mL (ref 0.450–4.500)

## 2013-07-10 MED ORDER — LEVOTHYROXINE SODIUM 50 MCG PO TABS: 50.0000 ug | ORAL_TABLET | Freq: Every day | ORAL | Status: DC

## 2013-07-10 NOTE — Progress Notes (Unsigned)
Pt came in for lab  only 

## 2013-07-11 LAB — BMP8+EGFR
BUN / CREAT RATIO: 20 (ref 10–22)
BUN: 20 mg/dL (ref 8–27)
CO2: 21 mmol/L (ref 18–29)
Calcium: 10.1 mg/dL (ref 8.6–10.2)
Chloride: 103 mmol/L (ref 97–108)
Creatinine, Ser: 1 mg/dL (ref 0.76–1.27)
GFR, EST AFRICAN AMERICAN: 82 mL/min/{1.73_m2} (ref >59)
GFR, EST NON AFRICAN AMERICAN: 71 mL/min/{1.73_m2} (ref >59)
Glucose: 98 mg/dL (ref 65–99)
POTASSIUM: 5.1 mmol/L (ref 3.5–5.2)
SODIUM: 144 mmol/L (ref 134–144)

## 2013-11-07 ENCOUNTER — Ambulatory Visit (INDEPENDENT_AMBULATORY_CARE_PROVIDER_SITE_OTHER): Payer: Medicare Other

## 2013-11-07 DIAGNOSIS — Z23 Encounter for immunization: Secondary | ICD-10-CM | POA: Diagnosis not present

## 2013-12-18 DIAGNOSIS — C61 Malignant neoplasm of prostate: Secondary | ICD-10-CM | POA: Diagnosis not present

## 2013-12-24 ENCOUNTER — Other Ambulatory Visit: Payer: Self-pay | Admitting: Nurse Practitioner

## 2013-12-24 NOTE — Telephone Encounter (Signed)
Refilled per protocol. Next appt in Dec

## 2013-12-25 DIAGNOSIS — C61 Malignant neoplasm of prostate: Secondary | ICD-10-CM | POA: Diagnosis not present

## 2014-01-07 ENCOUNTER — Encounter: Payer: Self-pay | Admitting: Nurse Practitioner

## 2014-01-07 ENCOUNTER — Ambulatory Visit (INDEPENDENT_AMBULATORY_CARE_PROVIDER_SITE_OTHER): Payer: Medicare Other | Admitting: Nurse Practitioner

## 2014-01-07 VITALS — BP 141/66 | Temp 96.9°F | Ht 73.0 in | Wt 251.0 lb

## 2014-01-07 DIAGNOSIS — I10 Essential (primary) hypertension: Secondary | ICD-10-CM

## 2014-01-07 DIAGNOSIS — T887XXA Unspecified adverse effect of drug or medicament, initial encounter: Secondary | ICD-10-CM

## 2014-01-07 DIAGNOSIS — E039 Hypothyroidism, unspecified: Secondary | ICD-10-CM | POA: Diagnosis not present

## 2014-01-07 DIAGNOSIS — E785 Hyperlipidemia, unspecified: Secondary | ICD-10-CM

## 2014-01-07 DIAGNOSIS — T50905A Adverse effect of unspecified drugs, medicaments and biological substances, initial encounter: Secondary | ICD-10-CM

## 2014-01-07 MED ORDER — LISINOPRIL 20 MG PO TABS: 20.0000 mg | ORAL_TABLET | Freq: Every morning | ORAL | Status: DC

## 2014-01-07 MED ORDER — LEVOTHYROXINE SODIUM 50 MCG PO TABS: ORAL_TABLET | ORAL | Status: DC

## 2014-01-07 MED ORDER — AMLODIPINE BESYLATE 5 MG PO TABS: 5.0000 mg | ORAL_TABLET | Freq: Every morning | ORAL | Status: DC

## 2014-01-07 NOTE — Patient Instructions (Signed)

## 2014-01-07 NOTE — Progress Notes (Signed)
Subjective:    Patient ID: [name], male    DOB: January 04, 1933, 80 y.o.   MRN: [id number]  Patient here today for follow up of chronic medical problems.  Hypertension This is a chronic problem. The current episode started more than 1 year ago. The problem is uncontrolled. Pertinent negatives include no chest pain, headaches, palpitations or shortness of breath. There are no associated agents to hypertension. Risk factors for coronary artery disease include dyslipidemia and male gender. Past treatments include ACE inhibitors and calcium channel blockers. Hypertensive end-organ damage includes a thyroid problem.  Hyperlipidemia This is a chronic problem. The current episode started more than 1 year ago. The problem is controlled. Recent lipid tests were reviewed and are normal. Exacerbating diseases include hypothyroidism. He has no history of diabetes or obesity. There are no known factors aggravating his hyperlipidemia. Pertinent negatives include no chest pain or shortness of breath. Current antihyperlipidemic treatment includes statins. The current treatment provides moderate improvement of lipids. Compliance problems include adherence to diet and adherence to exercise.  Risk factors for coronary artery disease include dyslipidemia, hypertension, male sex and post-menopausal.  Thyroid Problem Visit type: hypothyroidism. Symptoms include fatigue (occassionally). Patient reports no heat intolerance, palpitations or visual change. The symptoms have been stable. His past medical history is significant for hyperlipidemia. There is no history of diabetes.   * sees Dr. Karsten Ro for bladder problems and his last PSA was 3.11.  Review of Systems  Constitutional: Positive for fatigue (occassionally).  Respiratory: Negative for shortness of breath.   Cardiovascular: Negative for chest pain and palpitations.  Endocrine: Negative for heat intolerance.  Neurological: Negative for headaches.  All other  systems reviewed and are negative.      Objective:   Physical Exam  Constitutional: He is oriented to person, place, and time. He appears well-developed and well-nourished.  HENT:  Head: Normocephalic.  Right Ear: External ear normal.  Left Ear: External ear normal.  Nose: Nose normal.  Mouth/Throat: Oropharynx is clear and moist.  Eyes: EOM are normal. Pupils are equal, round, and reactive to light.  Neck: Normal range of motion. Neck supple. No JVD present. No thyromegaly present.  Cardiovascular: Normal rate, regular rhythm, normal heart sounds and intact distal pulses.  Exam reveals no gallop and no friction rub.   No murmur heard. Pulmonary/Chest: Effort normal and breath sounds normal. No respiratory distress. He has no wheezes. He has no rales. He exhibits no tenderness.  Abdominal: Soft. Bowel sounds are normal. He exhibits no mass. There is no tenderness.  Genitourinary: Prostate normal and penis normal.  Musculoskeletal: Normal range of motion. He exhibits no edema.  Lymphadenopathy:    He has no cervical adenopathy.  Neurological: He is alert and oriented to person, place, and time. No cranial nerve deficit.  Skin: Skin is warm and dry.  Psychiatric: He has a normal mood and affect. His behavior is normal. Judgment and thought content normal.   BP 141/66 mmHg  Temp(Src) 96.9 F (36.1 C) (Oral)  Ht '6\' 1"'  (1.854 m)  Wt 251 lb (113.853 kg)  BMI 33.12 kg/m2        Assessment & Plan:  1. Medication side effects, initial encounter On lupron and wanted vitamin d checked but insurance will ot cover it so cancelled order  2. Essential hypertension Do not add salt to diet - CMP14+EGFR - amLODipine (NORVASC) 5 MG tablet; Take 1 tablet (5 mg total) by mouth every morning.  Dispense: 90 tablet; Refill: 1 -  lisinopril (PRINIVIL,ZESTRIL) 20 MG tablet; Take 1 tablet (20 mg total) by mouth every morning.  Dispense: 90 tablet; Refill: 1  3. Hypothyroidism, unspecified  hypothyroidism type - Thyroid Panel With TSH - levothyroxine (SYNTHROID, LEVOTHROID) 50 MCG tablet; TAKE 1 TABLET (50 MCG TOTAL) BY MOUTH DAILY BEFORE BREAKFAST.  Dispense: 90 tablet; Refill: 1  4. Hyperlipidemia Watch fats in diet - NMR, lipoprofile    Labs pending Health maintenance reviewed Diet and exercise encouraged Continue all meds Follow up  In 3 months    [place], FNP

## 2014-01-08 LAB — CMP14+EGFR
A/G RATIO: 1.8 (ref 1.1–2.5)
ALT: 14 IU/L (ref 0–44)
AST: 18 IU/L (ref 0–40)
Albumin: 4.3 g/dL (ref 3.5–4.8)
Alkaline Phosphatase: 59 IU/L (ref 39–117)
BILIRUBIN TOTAL: 0.4 mg/dL (ref 0.0–1.2)
BUN/Creatinine Ratio: 20 (ref 10–22)
BUN: 18 mg/dL (ref 8–27)
CO2: 25 mmol/L (ref 18–29)
Calcium: 9.9 mg/dL (ref 8.6–10.2)
Chloride: 104 mmol/L (ref 97–108)
Creatinine, Ser: 0.91 mg/dL (ref 0.76–1.27)
GFR, EST AFRICAN AMERICAN: 92 mL/min/{1.73_m2} (ref >59)
GFR, EST NON AFRICAN AMERICAN: 80 mL/min/{1.73_m2} (ref >59)
Globulin, Total: 2.4 g/dL (ref 1.5–4.5)
Glucose: 97 mg/dL (ref 65–99)
Potassium: 5.5 mmol/L — ABNORMAL HIGH (ref 3.5–5.2)
SODIUM: 140 mmol/L (ref 134–144)
Total Protein: 6.7 g/dL (ref 6.0–8.5)

## 2014-01-08 LAB — NMR, LIPOPROFILE
Cholesterol: 177 mg/dL (ref 100–199)
HDL Cholesterol by NMR: 56 mg/dL (ref >39)
HDL PARTICLE NUMBER: 38.1 umol/L (ref >=30.5)
LDL Particle Number: 1319 nmol/L — ABNORMAL HIGH (ref <1000)
LDL SIZE: 20.5 nm (ref >20.5)
LDL-C: 90 mg/dL (ref 0–99)
LP-IR Score: 57 — ABNORMAL HIGH (ref <=45)
Small LDL Particle Number: 648 nmol/L — ABNORMAL HIGH (ref <=527)
TRIGLYCERIDES BY NMR: 154 mg/dL — AB (ref 0–149)

## 2014-01-08 LAB — THYROID PANEL WITH TSH
Free Thyroxine Index: 2.4 (ref 1.2–4.9)
T3 Uptake Ratio: 32 % (ref 24–39)
T4 TOTAL: 7.6 ug/dL (ref 4.5–12.0)
TSH: 3.37 u[IU]/mL (ref 0.450–4.500)

## 2014-01-09 DIAGNOSIS — L821 Other seborrheic keratosis: Secondary | ICD-10-CM | POA: Diagnosis not present

## 2014-01-09 DIAGNOSIS — L820 Inflamed seborrheic keratosis: Secondary | ICD-10-CM | POA: Diagnosis not present

## 2014-01-09 DIAGNOSIS — L570 Actinic keratosis: Secondary | ICD-10-CM | POA: Diagnosis not present

## 2014-01-09 DIAGNOSIS — D485 Neoplasm of uncertain behavior of skin: Secondary | ICD-10-CM | POA: Diagnosis not present

## 2014-01-14 ENCOUNTER — Encounter: Payer: Self-pay | Admitting: Pharmacist

## 2014-01-14 ENCOUNTER — Ambulatory Visit (INDEPENDENT_AMBULATORY_CARE_PROVIDER_SITE_OTHER): Payer: Medicare Other | Admitting: Pharmacist

## 2014-01-14 ENCOUNTER — Other Ambulatory Visit: Payer: Self-pay | Admitting: Pharmacist

## 2014-01-14 VITALS — Ht 73.0 in | Wt 250.0 lb

## 2014-01-14 DIAGNOSIS — Z0000 Encounter for general adult medical examination without abnormal findings: Secondary | ICD-10-CM | POA: Diagnosis not present

## 2014-01-14 DIAGNOSIS — I10 Essential (primary) hypertension: Secondary | ICD-10-CM

## 2014-01-14 DIAGNOSIS — Z23 Encounter for immunization: Secondary | ICD-10-CM | POA: Diagnosis not present

## 2014-01-14 DIAGNOSIS — E875 Hyperkalemia: Secondary | ICD-10-CM | POA: Diagnosis not present

## 2014-01-14 MED ORDER — MIRABEGRON ER 25 MG PO TB24: 25.0000 mg | ORAL_TABLET | Freq: Every day | ORAL | Status: DC

## 2014-01-14 NOTE — Progress Notes (Signed)
Subjective:    [name] is a 80 y.o. male who presents for Medicare Initial Wellness Visit Patient also concerned that medications are causing dizziness. Preventive Screening-Counseling & Management  Tobacco History  Smoking status  . Former Smoker -- 20 years  . Types: Cigarettes  . Quit date: 07/28/1979  Smokeless tobacco  . Former Systems developer  . Types: Chew  . Quit date: 07/28/1979    Comment: SMOKED AND CHEW TOBACCO FOR 31 YRS QUIT AGE 78    Current Problems (verified) Patient Active Problem List   Diagnosis Date Noted  . Hypertension 07/04/2012  . Hyperlipidemia 07/04/2012  . Hypothyroidism 07/04/2012  . Bladder neck contracture 06/22/2012  . Radiation proctitis 08/11/2010  . Anal stenosis 08/11/2010  . Personal history of colonic polyps 08/11/2010    Medications Prior to Visit Current Outpatient Prescriptions on File Prior to Visit  Medication Sig Dispense Refill  . amLODipine (NORVASC) 5 MG tablet Take 1 tablet (5 mg total) by mouth every morning. 90 tablet 1  . aspirin 81 MG chewable tablet Chew 81 mg by mouth every evening.     . Calcium Carb-Cholecalciferol (CALCIUM 600 + D PO) Take 1 tablet by mouth every morning.    . Cholecalciferol (VITAMIN D3) 5000 UNITS CAPS Take 1 capsule by mouth daily.    . Coenzyme Q10-Red Yeast Rice 60-600 MG CAPS Take 2 capsules by mouth daily. THIS PILL ALSO HAS 100MG NIACIN    . FLAXSEED, LINSEED, PO Take 30 mLs by mouth every morning. GRINDS FLAX SEEDS AND PUTS IN 1/4 CUP OF WATER    . ibuprofen (ADVIL,MOTRIN) 200 MG tablet Take 400 mg by mouth every 6 (six) hours as needed for moderate pain.    . Incontinence Supply Disposable (INCONTINENCE BRIEF LARGE) MISC 1 each by Does not apply route 5 (five) times daily. 150 each 11  . Leuprolide Acetate, 6 Month, (LUPRON DEPOT) 45 MG injection Inject 45 mg into the muscle every 6 (six) months.     . levothyroxine (SYNTHROID, LEVOTHROID) 50 MCG tablet TAKE 1 TABLET (50 MCG TOTAL) BY MOUTH  DAILY BEFORE BREAKFAST. 90 tablet 1  . lisinopril (PRINIVIL,ZESTRIL) 20 MG tablet Take 1 tablet (20 mg total) by mouth every morning. 90 tablet 1  . Magnesium 300 MG CAPS Take 1 capsule by mouth daily.    . Probiotic Product (PROBIOTIC DAILY PO) Take 1 capsule by mouth daily.    . diphenhydrAMINE (BENADRYL) 25 MG tablet Take 25 mg by mouth every 6 (six) hours as needed for itching.    . VESICARE 5 MG tablet Take 5 mg by mouth daily.      No current facility-administered medications on file prior to visit.    Current Medications (verified) Current Outpatient Prescriptions  Medication Sig Dispense Refill  . amLODipine (NORVASC) 5 MG tablet Take 1 tablet (5 mg total) by mouth every morning. 90 tablet 1  . aspirin 81 MG chewable tablet Chew 81 mg by mouth every evening.     . Calcium Carb-Cholecalciferol (CALCIUM 600 + D PO) Take 1 tablet by mouth every morning.    . Cholecalciferol (VITAMIN D3) 5000 UNITS CAPS Take 1 capsule by mouth daily.    . Coenzyme Q10-Red Yeast Rice 60-600 MG CAPS Take 2 capsules by mouth daily. THIS PILL ALSO HAS 100MG NIACIN    . FLAXSEED, LINSEED, PO Take 30 mLs by mouth every morning. GRINDS FLAX SEEDS AND PUTS IN 1/4 CUP OF WATER    . ibuprofen (ADVIL,MOTRIN) 200 MG  tablet Take 400 mg by mouth every 6 (six) hours as needed for moderate pain.    . Incontinence Supply Disposable (INCONTINENCE BRIEF LARGE) MISC 1 each by Does not apply route 5 (five) times daily. 150 each 11  . Leuprolide Acetate, 6 Month, (LUPRON DEPOT) 45 MG injection Inject 45 mg into the muscle every 6 (six) months.     . levothyroxine (SYNTHROID, LEVOTHROID) 50 MCG tablet TAKE 1 TABLET (50 MCG TOTAL) BY MOUTH DAILY BEFORE BREAKFAST. 90 tablet 1  . lisinopril (PRINIVIL,ZESTRIL) 20 MG tablet Take 1 tablet (20 mg total) by mouth every morning. 90 tablet 1  . Magnesium 300 MG CAPS Take 1 capsule by mouth daily.    . Probiotic Product (PROBIOTIC DAILY PO) Take 1 capsule by mouth daily.    .  diphenhydrAMINE (BENADRYL) 25 MG tablet Take 25 mg by mouth every 6 (six) hours as needed for itching.    . VESICARE 5 MG tablet Take 5 mg by mouth daily.      No current facility-administered medications for this visit.     Allergies (verified) Caffeine and Codeine   PAST HISTORY  Family History Family History  Problem Relation Age of Onset  . Prostate cancer Brother   . Kidney disease Brother   . Congestive Heart Failure Mother   . Pancreatitis Father   . Pneumonia Father   . Cancer Sister     breast  . Obesity Sister   . Early death Sister     appendicitis  . Cancer Sister     breast  . Alzheimer's disease Brother   . Hip fracture Brother     Social History History  Substance Use Topics  . Smoking status: Former Smoker -- 20 years    Types: Cigarettes    Quit date: 07/28/1979  . Smokeless tobacco: Former Systems developer    Types: [place] date: 07/28/1979     Comment: SMOKED AND CHEW TOBACCO FOR 20 YRS QUIT AGE 12  . Alcohol Use: No    Are there smokers in your home (other than you)?  No  Risk Factors Current exercise habits: Gym/ health club routine includes treadmill.  Dietary issues discussed: limiting sugar and carbs in diet   Cardiac risk factors: advanced age (older than 9 for men, 52 for women), dyslipidemia, hypertension, male gender and obesity (BMI >= 30 kg/m2).  Depression Screen (Note: if answer to either of the following is "Yes", a more complete depression screening is indicated)   Q1: Over the past two weeks, have you felt down, depressed or hopeless? No  Q2: Over the past two weeks, have you felt little interest or pleasure in doing things? No  Have you lost interest or pleasure in daily life? No  Do you often feel hopeless? No  Do you cry easily over simple problems? No  Activities of Daily Living In your present state of health, do you have any difficulty performing the following activities?:  Driving? No Managing money?  No Feeding  yourself? No Getting from bed to chair? No Climbing a flight of stairs? No Preparing food and eating?: No Bathing or showering? No Getting dressed: No Getting to the toilet? No Using the toilet:No Moving around from place to place: No In the past year have you fallen or had a near fall?:No   Are you sexually active?  No  Do you have more than one partner?  No  Hearing Difficulties: No Do you often ask people to speak  up or repeat themselves? No Do you experience ringing or noises in your ears? No Do you have difficulty understanding soft or whispered voices? No   Do you feel that you have a problem with memory? No  Do you often misplace items? No  Do you feel safe at home?  Yes  Cognitive Testing  Alert? Yes  Normal Appearance?Yes  Oriented to person? Yes  Place? Yes   Time? Yes  Recall of three objects?  Yes  Can perform simple calculations? Yes  Displays appropriate judgment?Yes  Can read the correct time from a watch face?Yes   Advanced Directives have been discussed with the patient? Yes   List the Names of Other Physician/Practitioners you currently use: 1.  [facility] - urologist 2.  Shanon Brow - optometrist [facility]) 3.  [place] 4.  [facility] - dermatology 5.  Carlean Purl - GI  Indicate any recent Medical Services you may have received from other than [facility] providers in the past year (date may be approximate).  Immunization History  Administered Date(s) Administered  . Influenza,inj,Quad PF,36+ Mos 11/07/2013  . Influenza-Unspecified 11/25/2012  . Pneumococcal Polysaccharide-23 07/04/2012  . Zoster 01/05/2013    Screening Tests Health Maintenance  Topic Date Due  . TETANUS/TDAP  06/28/1951  . INFLUENZA VACCINE  08/26/2014  . PNEUMOCOCCAL POLYSACCHARIDE VACCINE AGE 4 AND OVER  Completed  . ZOSTAVAX  Completed    All answers were reviewed with the patient and necessary referrals were made:  Cherre Robins, [facility]   01/14/2014    History reviewed: allergies, current medications, past family history, past medical history, past social history, past surgical history and problem list  Review of Systems Eyes: negative except for cataracts Cardiovascular: negative Genitourinary:negative except for urinary incontinence Musculoskeletal:negative Neurological: negative except for dizziness    Objective:    Height _0  (1.854 m), weight 250 lb (113.399 kg). Body mass index is 32.99 kg/(m^2).   Assessment:     Initial Medicare Wellness Visit Dizziness Medication Management HTN Urinary Incontinence resulting from treatment for prostate cancer.     Plan:     During the course of the visit the patient was educated and counseled about appropriate screening and preventive services including:    Pneumococcal vaccine - Prevnar 13 given in office today  Influenza vaccine - UTD  Td vaccine - check on cost - today would be $37 / postponed by patient due to cost  Prostate cancer screening - UTD / patient sees Dr Karsten Ro due to history of prostate cancer  Colorectal cancer screening - UTD; per note from Dr Carlean Purl from 2012 patient doesn't need repeat colonoscopy unless have symptoms.  Diabetes screening - UTD  Glaucoma screening - due to have eye exam now - patient to make appt 2016  Nutrition counseling - discussed limiting high sugar and CHO containing foods  Bone Density - due to recheck in 05/2014 - due to patient taking Lupron injections q 6 months.  This is usually ordered by his urologist.  Will request past BMD results from 2014.  Advanced directives: has an advanced directive - a copy HAS NOT been provided.  Decrease amlodipine to 31m 1/2 tablet and switch to pm dosing to see if dizziness improves.   Start Myrbetriq 244m1 tablet daily for urinary incontinence.  (patient given 30 days trial coupon) will replace vesicare if works better.   Orders Placed This Encounter  Procedures  . Pneumococcal  conjugate vaccine 13-valent  . BMP8+EGFR  Patient Instructions (the written plan) was given to the patient.  Medicare Attestation I have personally reviewed: The patient's medical and social history Their use of alcohol, tobacco or illicit drugs Their current medications and supplements The patient's functional ability including ADLs,fall risks, home safety risks, cognitive, and hearing and visual impairment Diet and physical activities Evidence for depression or mood disorders  The patient's weight, height, BMI, and BP/HR have been recorded in the chart.  I have made referrals, counseling, and provided education to the patient based on review of the above and I have provided the patient with a written personalized care plan for preventive services.     Cherre Robins, [facility]   01/14/2014

## 2014-01-14 NOTE — Patient Instructions (Addendum)
Health Maintenance Summary     TETANUS/TDAP Overdue 06/28/1951 Cost today would be $37    INFLUENZA VACCINE Next Due 08/26/2014 Last Fall 2015   Colonoscopy Completed - no longer required  Last was 08/19/2010   Pneumonia Vaccine Received today 01/14/2014     Shingles Vaccine Completed 01/05/2013    Eye Exam Next Due     Bone Density Next Due  05/2014 Last done 05/2012   Preventive Care for Adults A healthy lifestyle and preventive care can promote health and wellness. Preventive health guidelines for men include the following key practices:  A routine yearly physical is a good way to check with your health care provider about your health and preventative screening. It is a chance to share any concerns and updates on your health and to receive a thorough exam.  Visit your dentist for a routine exam and preventative care every 6 months. Brush your teeth twice a day and floss once a day. Good oral hygiene prevents tooth decay and gum disease.  The frequency of eye exams is based on your age, health, family medical history, use of contact lenses, and other factors. Follow your health care provider's recommendations for frequency of eye exams.  Eat a healthy diet. Foods such as vegetables, fruits, whole grains, low-fat dairy products, and lean protein foods contain the nutrients you need without too many calories. Decrease your intake of foods high in solid fats, added sugars, and salt. Eat the right amount of calories for you.Get information about a proper diet from your health care provider, if necessary.  Regular physical exercise is one of the most important things you can do for your health. Most adults should get at least 150 minutes of moderate-intensity exercise (any activity that increases your heart rate and causes you to sweat) each week. In addition, most adults need muscle-strengthening exercises on 2 or more days a week.  Maintain a healthy weight. The body mass index (BMI) is a screening  tool to identify possible weight problems. It provides an estimate of body fat based on height and weight. Your health care provider can find your BMI and can help you achieve or maintain a healthy weight.For adults 20 years and older:  A BMI below 18.5 is considered underweight.  A BMI of 18.5 to 24.9 is normal.  A BMI of 25 to 29.9 is considered overweight.  A BMI of 30 and above is considered obese.  Maintain normal blood lipids and cholesterol levels by exercising and minimizing your intake of saturated fat. Eat a balanced diet with plenty of fruit and vegetables. Blood tests for lipids and cholesterol should begin at age 14 and be repeated every 5 years. If your lipid or cholesterol levels are high, you are over 50, or you are at high risk for heart disease, you may need your cholesterol levels checked more frequently.Ongoing high lipid and cholesterol levels should be treated with medicines if diet and exercise are not working.  If you smoke, find out from your health care provider how to quit. If you do not use tobacco, do not start.  Lung cancer screening is recommended for adults aged 64-80 years who are at high risk for developing lung cancer because of a history of smoking. A yearly low-dose CT scan of the lungs is recommended for people who have at least a 30-pack-year history of smoking and are a current smoker or have quit within the past 15 years. A pack year of smoking is smoking an average  of 1 pack of cigarettes a day for 1 year (for example: 1 pack a day for 30 years or 2 packs a day for 15 years). Yearly screening should continue until the smoker has stopped smoking for at least 15 years. Yearly screening should be stopped for people who develop a health problem that would prevent them from having lung cancer treatment.  If you choose to drink alcohol, do not have more than 2 drinks per day. One drink is considered to be 12 ounces (355 mL) of beer, 5 ounces (148 mL) of wine, or  1.5 ounces (44 mL) of liquor.  Avoid use of street drugs. Do not share needles with anyone. Ask for help if you need support or instructions about stopping the use of drugs.  High blood pressure causes heart disease and increases the risk of stroke. Your blood pressure should be checked at least every 1-2 years. Ongoing high blood pressure should be treated with medicines, if weight loss and exercise are not effective.  If you are 43-69 years old, ask your health care provider if you should take aspirin to prevent heart disease.  Diabetes screening involves taking a blood sample to check your fasting blood sugar level. This should be done once every 3 years, after age 15, if you are within normal weight and without risk factors for diabetes. Testing should be considered at a younger age or be carried out more frequently if you are overweight and have at least 1 risk factor for diabetes.  Colorectal cancer can be detected and often prevented. Most routine colorectal cancer screening begins at the age of 39 and continues through age 32. However, your health care provider may recommend screening at an earlier age if you have risk factors for colon cancer. On a yearly basis, your health care provider may provide home test kits to check for hidden blood in the stool. Use of a small camera at the end of a tube to directly examine the colon (sigmoidoscopy or colonoscopy) can detect the earliest forms of colorectal cancer. Talk to your health care provider about this at age 69, when routine screening begins. Direct exam of the colon should be repeated every 5-10 years through age 86, unless early forms of precancerous polyps or small growths are found.  People who are at an increased risk for hepatitis B should be screened for this virus. You are considered at high risk for hepatitis B if:  You were born in a country where hepatitis B occurs often. Talk with your health care provider about which countries are  considered high risk.  Your parents were born in a high-risk country and you have not received a shot to protect against hepatitis B (hepatitis B vaccine).  You have HIV or AIDS.  You use needles to inject street drugs.  You live with, or have sex with, someone who has hepatitis B.  You are a man who has sex with other men (MSM).  You get hemodialysis treatment.  You take certain medicines for conditions such as cancer, organ transplantation, and autoimmune conditions.  Hepatitis C blood testing is recommended for all people born from [birth year] through 1965 and any individual with known risks for hepatitis C.  Practice safe sex. Use condoms and avoid high-risk sexual practices to reduce the spread of sexually transmitted infections (STIs). STIs include gonorrhea, chlamydia, syphilis, trichomonas, herpes, HPV, and human immunodeficiency virus (HIV). Herpes, HIV, and HPV are viral illnesses that have no cure. They can result in  disability, cancer, and death.  If you are at risk of being infected with HIV, it is recommended that you take a prescription medicine daily to prevent HIV infection. This is called preexposure prophylaxis (PrEP). You are considered at risk if:  You are a man who has sex with other men (MSM) and have other risk factors.  You are a heterosexual man, are sexually active, and are at increased risk for HIV infection.  You take drugs by injection.  You are sexually active with a partner who has HIV.  Talk with your health care provider about whether you are at high risk of being infected with HIV. If you choose to begin PrEP, you should first be tested for HIV. You should then be tested every 3 months for as long as you are taking PrEP.  A one-time screening for abdominal aortic aneurysm (AAA) and surgical repair of large AAAs by ultrasound are recommended for men ages 8 to 51 years who are current or former smokers.  Healthy men should no longer receive  prostate-specific antigen (PSA) blood tests as part of routine cancer screening. Talk with your health care provider about prostate cancer screening.  Testicular cancer screening is not recommended for adult males who have no symptoms. Screening includes self-exam, a health care provider exam, and other screening tests. Consult with your health care provider about any symptoms you have or any concerns you have about testicular cancer.  Use sunscreen. Apply sunscreen liberally and repeatedly throughout the day. You should seek shade when your shadow is shorter than you. Protect yourself by wearing long sleeves, pants, a wide-brimmed hat, and sunglasses year round, whenever you are outdoors.  Once a month, do a whole-body skin exam, using a mirror to look at the skin on your back. Tell your health care provider about new moles, moles that have irregular borders, moles that are larger than a pencil eraser, or moles that have changed in shape or color.  Stay current with required vaccines (immunizations).  Influenza vaccine. All adults should be immunized every year.  Tetanus, diphtheria, and acellular pertussis (Td, Tdap) vaccine. An adult who has not previously received Tdap or who does not know his vaccine status should receive 1 dose of Tdap. This initial dose should be followed by tetanus and diphtheria toxoids (Td) booster doses every 10 years. Adults with an unknown or incomplete history of completing a 3-dose immunization series with Td-containing vaccines should begin or complete a primary immunization series including a Tdap dose. Adults should receive a Td booster every 10 years.  Varicella vaccine. An adult without evidence of immunity to varicella should receive 2 doses or a second dose if he has previously received 1 dose.  Human papillomavirus (HPV) vaccine. Males aged 98-21 years who have not received the vaccine previously should receive the 3-dose series. Males aged 22-26 years may be  immunized. Immunization is recommended through the age of 42 years for any male who has sex with males and did not get any or all doses earlier. Immunization is recommended for any person with an immunocompromised condition through the age of 85 years if he did not get any or all doses earlier. During the 3-dose series, the second dose should be obtained 4-8 weeks after the first dose. The third dose should be obtained 24 weeks after the first dose and 16 weeks after the second dose.  Zoster vaccine. One dose is recommended for adults aged 59 years or older unless certain conditions are present.  Measles, mumps, and rubella (MMR) vaccine. Adults born before 42 generally are considered immune to measles and mumps. Adults born in [birth year] or later should have 1 or more doses of MMR vaccine unless there is a contraindication to the vaccine or there is laboratory evidence of immunity to each of the three diseases. A routine second dose of MMR vaccine should be obtained at least 28 days after the first dose for students attending postsecondary schools, health care workers, or international travelers. People who received inactivated measles vaccine or an unknown type of measles vaccine during 1963-1967 should receive 2 doses of MMR vaccine. People who received inactivated mumps vaccine or an unknown type of mumps vaccine before 1979 and are at high risk for mumps infection should consider immunization with 2 doses of MMR vaccine. Unvaccinated health care workers born before [birth year] who lack laboratory evidence of measles, mumps, or rubella immunity or laboratory confirmation of disease should consider measles and mumps immunization with 2 doses of MMR vaccine or rubella immunization with 1 dose of MMR vaccine.  Pneumococcal 13-valent conjugate (PCV13) vaccine. When indicated, a person who is uncertain of his immunization history and has no record of immunization should receive the PCV13 vaccine. An adult aged 67 years or  older who has certain medical conditions and has not been previously immunized should receive 1 dose of PCV13 vaccine. This PCV13 should be followed with a dose of pneumococcal polysaccharide (PPSV23) vaccine. The PPSV23 vaccine dose should be obtained at least 8 weeks after the dose of PCV13 vaccine. An adult aged 40 years or older who has certain medical conditions and previously received 1 or more doses of PPSV23 vaccine should receive 1 dose of PCV13. The PCV13 vaccine dose should be obtained 1 or more years after the last PPSV23 vaccine dose.  Pneumococcal polysaccharide (PPSV23) vaccine. When PCV13 is also indicated, PCV13 should be obtained first. All adults aged 64 years and older should be immunized. An adult younger than age 106 years who has certain medical conditions should be immunized. Any person who resides in a nursing home or long-term care facility should be immunized. An adult smoker should be immunized. People with an immunocompromised condition and certain other conditions should receive both PCV13 and PPSV23 vaccines. People with human immunodeficiency virus (HIV) infection should be immunized as soon as possible after diagnosis. Immunization during chemotherapy or radiation therapy should be avoided. Routine use of PPSV23 vaccine is not recommended for American Indians, [place] Natives, or people younger than 65 years unless there are medical conditions that require PPSV23 vaccine. When indicated, people who have unknown immunization and have no record of immunization should receive PPSV23 vaccine. One-time revaccination 5 years after the first dose of PPSV23 is recommended for people aged 19-64 years who have chronic kidney failure, nephrotic syndrome, asplenia, or immunocompromised conditions. People who received 1-2 doses of PPSV23 before age 75 years should receive another dose of PPSV23 vaccine at age 28 years or later if at least 5 years have passed since the previous dose. Doses of  PPSV23 are not needed for people immunized with PPSV23 at or after age 19 years.  Meningococcal vaccine. Adults with asplenia or persistent complement component deficiencies should receive 2 doses of quadrivalent meningococcal conjugate (MenACWY-D) vaccine. The doses should be obtained at least 2 months apart. Microbiologists working with certain meningococcal bacteria, [employer] recruits, people at risk during an outbreak, and people who travel to or live in countries with a high rate of meningitis should be immunized.  A first-year college student up through age 5 years who is living in a residence hall should receive a dose if he did not receive a dose on or after his 16th birthday. Adults who have certain high-risk conditions should receive one or more doses of vaccine.  Hepatitis A vaccine. Adults who wish to be protected from this disease, have certain high-risk conditions, work with hepatitis A-infected animals, work in hepatitis A research labs, or travel to or work in countries with a high rate of hepatitis A should be immunized. Adults who were previously unvaccinated and who anticipate close contact with an international adoptee during the first 60 days after arrival in the [place] from a country with a high rate of hepatitis A should be immunized.  Hepatitis B vaccine. Adults should be immunized if they wish to be protected from this disease, have certain high-risk conditions, may be exposed to blood or other infectious body fluids, are household contacts or sex partners of hepatitis B positive people, are clients or workers in certain care facilities, or travel to or work in countries with a high rate of hepatitis B.  Haemophilus influenzae type b (Hib) vaccine. A previously unvaccinated person with asplenia or sickle cell disease or having a scheduled splenectomy should receive 1 dose of Hib vaccine. Regardless of previous immunization, a recipient of a hematopoietic stem cell transplant  should receive a 3-dose series 6-12 months after his successful transplant. Hib vaccine is not recommended for adults with HIV infection. Preventive Service / Frequency Ages 25 to 42  Blood pressure check.** / Every 1 to 2 years.  Lipid and cholesterol check.** / Every 5 years beginning at age 31.  Hepatitis C blood test.** / For any individual with known risks for hepatitis C.  Skin self-exam. / Monthly.  Influenza vaccine. / Every year.  Tetanus, diphtheria, and acellular pertussis (Tdap, Td) vaccine.** / Consult your health care provider. 1 dose of Td every 10 years.  Varicella vaccine.** / Consult your health care provider.  HPV vaccine. / 3 doses over 6 months, if 41 or younger.  Measles, mumps, rubella (MMR) vaccine.** / You need at least 1 dose of MMR if you were born in 1957 or later. You may also need a second dose.  Pneumococcal 13-valent conjugate (PCV13) vaccine.** / Consult your health care provider.  Pneumococcal polysaccharide (PPSV23) vaccine.** / 1 to 2 doses if you smoke cigarettes or if you have certain conditions.  Meningococcal vaccine.** / 1 dose if you are age 10 to 39 years and a Market researcher living in a residence hall, or have one of several medical conditions. You may also need additional booster doses.  Hepatitis A vaccine.** / Consult your health care provider.  Hepatitis B vaccine.** / Consult your health care provider.  Haemophilus influenzae type b (Hib) vaccine.** / Consult your health care provider. Ages 24 to 78  Blood pressure check.** / Every 1 to 2 years.  Lipid and cholesterol check.** / Every 5 years beginning at age 59.  Lung cancer screening. / Every year if you are aged 7-80 years and have a 30-pack-year history of smoking and currently smoke or have quit within the past 15 years. Yearly screening is stopped once you have quit smoking for at least 15 years or develop a health problem that would prevent you from having  lung cancer treatment.  Fecal occult blood test (FOBT) of stool. / Every year beginning at age 31 and continuing until age 28. You may  not have to do this test if you get a colonoscopy every 10 years.  Flexible sigmoidoscopy** or colonoscopy.** / Every 5 years for a flexible sigmoidoscopy or every 10 years for a colonoscopy beginning at age 69 and continuing until age 70.  Hepatitis C blood test.** / For all people born from [birth year] through 1965 and any individual with known risks for hepatitis C.  Skin self-exam. / Monthly.  Influenza vaccine. / Every year.  Tetanus, diphtheria, and acellular pertussis (Tdap/Td) vaccine.** / Consult your health care provider. 1 dose of Td every 10 years.  Varicella vaccine.** / Consult your health care provider.  Zoster vaccine.** / 1 dose for adults aged 41 years or older.  Measles, mumps, rubella (MMR) vaccine.** / You need at least 1 dose of MMR if you were born in 1955 or later. You may also need a second dose.  Pneumococcal 13-valent conjugate (PCV13) vaccine.** / Consult your health care provider.  Pneumococcal polysaccharide (PPSV23) vaccine.** / 1 to 2 doses if you smoke cigarettes or if you have certain conditions.  Meningococcal vaccine.** / Consult your health care provider.  Hepatitis A vaccine.** / Consult your health care provider.  Hepatitis B vaccine.** / Consult your health care provider.  Haemophilus influenzae type b (Hib) vaccine.** / Consult your health care provider. Ages 36 and over  Blood pressure check.** / Every 1 to 2 years.  Lipid and cholesterol check.**/ Every 5 years beginning at age 74.  Lung cancer screening. / Every year if you are aged 10-80 years and have a 30-pack-year history of smoking and currently smoke or have quit within the past 15 years. Yearly screening is stopped once you have quit smoking for at least 15 years or develop a health problem that would prevent you from having lung cancer  treatment.  Fecal occult blood test (FOBT) of stool. / Every year beginning at age 69 and continuing until age 7. You may not have to do this test if you get a colonoscopy every 10 years.  Flexible sigmoidoscopy** or colonoscopy.** / Every 5 years for a flexible sigmoidoscopy or every 10 years for a colonoscopy beginning at age 37 and continuing until age 70.  Hepatitis C blood test.** / For all people born from [birth year] through 1965 and any individual with known risks for hepatitis C.  Abdominal aortic aneurysm (AAA) screening.** / A one-time screening for ages 63 to 84 years who are current or former smokers.  Skin self-exam. / Monthly.  Influenza vaccine. / Every year.  Tetanus, diphtheria, and acellular pertussis (Tdap/Td) vaccine.** / 1 dose of Td every 10 years.  Varicella vaccine.** / Consult your health care provider.  Zoster vaccine.** / 1 dose for adults aged 35 years or older.  Pneumococcal 13-valent conjugate (PCV13) vaccine.** / Consult your health care provider.  Pneumococcal polysaccharide (PPSV23) vaccine.** / 1 dose for all adults aged 40 years and older.  Meningococcal vaccine.** / Consult your health care provider.  Hepatitis A vaccine.** / Consult your health care provider.  Hepatitis B vaccine.** / Consult your health care provider.  Haemophilus influenzae type b (Hib) vaccine.** / Consult your health care provider. **Family history and personal history of risk and conditions may change your health care provider's recommendations. Document Released: 03/09/2001 Document Revised: 01/16/2013 Document Reviewed: 06/08/2010 [facility] Patient Information 2015 [place], [place]. This information is not intended to replace advice given to you by your health care provider. Make sure you discuss any questions you have with your health care provider.

## 2014-01-15 LAB — BMP8+EGFR
BUN / CREAT RATIO: 20 (ref 10–22)
BUN: 18 mg/dL (ref 8–27)
CHLORIDE: 104 mmol/L (ref 97–108)
CO2: 24 mmol/L (ref 18–29)
Calcium: 9.9 mg/dL (ref 8.6–10.2)
Creatinine, Ser: 0.92 mg/dL (ref 0.76–1.27)
GFR calc non Af Amer: 79 mL/min/{1.73_m2} (ref >59)
GFR, EST AFRICAN AMERICAN: 91 mL/min/{1.73_m2} (ref >59)
Glucose: 94 mg/dL (ref 65–99)
Potassium: 5.5 mmol/L — ABNORMAL HIGH (ref 3.5–5.2)
Sodium: 141 mmol/L (ref 134–144)

## 2014-01-16 ENCOUNTER — Telehealth: Payer: Self-pay | Admitting: *Deleted

## 2014-01-16 NOTE — Telephone Encounter (Signed)
Aware of potassium level.  He will monitor foods that he eats to try to help with the level.

## 2014-01-28 ENCOUNTER — Encounter: Payer: Self-pay | Admitting: *Deleted

## 2014-02-25 ENCOUNTER — Ambulatory Visit (INDEPENDENT_AMBULATORY_CARE_PROVIDER_SITE_OTHER): Payer: Medicare Other | Admitting: Pharmacist

## 2014-02-25 ENCOUNTER — Encounter: Payer: Self-pay | Admitting: Pharmacist

## 2014-02-25 VITALS — BP 134/78 | HR 68 | Ht 73.0 in | Wt 249.5 lb

## 2014-02-25 DIAGNOSIS — E875 Hyperkalemia: Secondary | ICD-10-CM | POA: Diagnosis not present

## 2014-02-25 DIAGNOSIS — I10 Essential (primary) hypertension: Secondary | ICD-10-CM | POA: Diagnosis not present

## 2014-02-25 DIAGNOSIS — R32 Unspecified urinary incontinence: Secondary | ICD-10-CM | POA: Diagnosis not present

## 2014-02-25 NOTE — Progress Notes (Signed)
Subjective:    Patient here for follow-up of HTN, dizziness and urinary incontinence.  He was seen for Annual Wellness Visit in December 2015.  At that appt patient c/o dizziness.  Decreased dose of amlodipine to 2.76m qpm and also gave trial of myrbetriq 253mdaily in place of vesicare.  He states that dizziness if much better.  Though he would like to do a trial of vesicare for 1 month because he think he might have had better urinary control with vesicare.  Patient also had elevated potassium on last 2 BMET.  He previously ate a banana daily.  He has been trying to limit migh potassium food in diet over the last week.    He is exercising and is adherent to a low-salt diet.  Blood pressure is well controlled at home. Cardiac symptoms: none. Patient denies: chest pressure/discomfort, dyspnea, fatigue, lower extremity edema, palpitations and syncope. Cardiovascular risk factors: advanced age (older than 5561or men, 6559or women), dyslipidemia, hypertension, male gender and obesity (BMI >= 30 kg/m2).     Objective:    Filed Vitals:   02/25/14 1017  BP: 134/78  Pulse: 68   Filed Weights   02/25/14 1017  Weight: 249 lb 8 oz (113.172 kg)   Body mass index is 32.92 kg/(m^2).  Assessment:    Hypertension - controlled  Dizziness - improved Urinary incontinence - stable Hyperkalemia    Plan:    Medication: no change. Continue Regular aerobic exercise. May retry vesicare and let me know which medication he feels works better and has less side effects - vesicare or myrbetriq  Orders Placed This Encounter  Procedures  . BMP8+EGFR  Discussed foods that are high and low in potassium and handout given.  Follow up: 6 months and as needed.    TaCherre RobinsPharmD, CPP

## 2014-02-26 LAB — BMP8+EGFR
BUN / CREAT RATIO: 17 (ref 10–22)
BUN: 18 mg/dL (ref 8–27)
CALCIUM: 9.5 mg/dL (ref 8.6–10.2)
CO2: 23 mmol/L (ref 18–29)
Chloride: 105 mmol/L (ref 97–108)
Creatinine, Ser: 1.04 mg/dL (ref 0.76–1.27)
GFR calc Af Amer: 79 mL/min/{1.73_m2} (ref >59)
GFR calc non Af Amer: 68 mL/min/{1.73_m2} (ref >59)
GLUCOSE: 97 mg/dL (ref 65–99)
Potassium: 5 mmol/L (ref 3.5–5.2)
Sodium: 140 mmol/L (ref 134–144)

## 2014-03-15 DIAGNOSIS — R339 Retention of urine, unspecified: Secondary | ICD-10-CM | POA: Diagnosis not present

## 2014-03-15 DIAGNOSIS — R310 Gross hematuria: Secondary | ICD-10-CM | POA: Diagnosis not present

## 2014-03-20 ENCOUNTER — Other Ambulatory Visit: Payer: Self-pay | Admitting: Nurse Practitioner

## 2014-03-21 DIAGNOSIS — R310 Gross hematuria: Secondary | ICD-10-CM | POA: Diagnosis not present

## 2014-03-21 DIAGNOSIS — N3040 Irradiation cystitis without hematuria: Secondary | ICD-10-CM | POA: Diagnosis not present

## 2014-04-09 DIAGNOSIS — R310 Gross hematuria: Secondary | ICD-10-CM | POA: Diagnosis not present

## 2014-04-09 DIAGNOSIS — N3040 Irradiation cystitis without hematuria: Secondary | ICD-10-CM | POA: Diagnosis not present

## 2014-04-15 ENCOUNTER — Encounter (HOSPITAL_BASED_OUTPATIENT_CLINIC_OR_DEPARTMENT_OTHER): Payer: Medicare Other | Attending: Plastic Surgery

## 2014-04-15 DIAGNOSIS — Z923 Personal history of irradiation: Secondary | ICD-10-CM | POA: Diagnosis not present

## 2014-04-15 DIAGNOSIS — Z87891 Personal history of nicotine dependence: Secondary | ICD-10-CM | POA: Insufficient documentation

## 2014-04-15 DIAGNOSIS — N3041 Irradiation cystitis with hematuria: Secondary | ICD-10-CM | POA: Insufficient documentation

## 2014-04-15 DIAGNOSIS — Z9079 Acquired absence of other genital organ(s): Secondary | ICD-10-CM | POA: Insufficient documentation

## 2014-04-15 DIAGNOSIS — Z8546 Personal history of malignant neoplasm of prostate: Secondary | ICD-10-CM | POA: Diagnosis not present

## 2014-04-15 DIAGNOSIS — Z960 Presence of urogenital implants: Secondary | ICD-10-CM | POA: Diagnosis not present

## 2014-04-17 DIAGNOSIS — Z923 Personal history of irradiation: Secondary | ICD-10-CM | POA: Diagnosis not present

## 2014-04-17 DIAGNOSIS — Z9079 Acquired absence of other genital organ(s): Secondary | ICD-10-CM | POA: Diagnosis not present

## 2014-04-17 DIAGNOSIS — Z87891 Personal history of nicotine dependence: Secondary | ICD-10-CM | POA: Diagnosis not present

## 2014-04-17 DIAGNOSIS — Z960 Presence of urogenital implants: Secondary | ICD-10-CM | POA: Diagnosis not present

## 2014-04-17 DIAGNOSIS — Z8546 Personal history of malignant neoplasm of prostate: Secondary | ICD-10-CM | POA: Diagnosis not present

## 2014-04-17 DIAGNOSIS — N3041 Irradiation cystitis with hematuria: Secondary | ICD-10-CM | POA: Diagnosis not present

## 2014-04-18 DIAGNOSIS — Z8546 Personal history of malignant neoplasm of prostate: Secondary | ICD-10-CM | POA: Diagnosis not present

## 2014-04-18 DIAGNOSIS — Z923 Personal history of irradiation: Secondary | ICD-10-CM | POA: Diagnosis not present

## 2014-04-18 DIAGNOSIS — Z9079 Acquired absence of other genital organ(s): Secondary | ICD-10-CM | POA: Diagnosis not present

## 2014-04-18 DIAGNOSIS — Z87891 Personal history of nicotine dependence: Secondary | ICD-10-CM | POA: Diagnosis not present

## 2014-04-18 DIAGNOSIS — N3041 Irradiation cystitis with hematuria: Secondary | ICD-10-CM | POA: Diagnosis not present

## 2014-04-18 DIAGNOSIS — Z960 Presence of urogenital implants: Secondary | ICD-10-CM | POA: Diagnosis not present

## 2014-04-19 DIAGNOSIS — Z923 Personal history of irradiation: Secondary | ICD-10-CM | POA: Diagnosis not present

## 2014-04-19 DIAGNOSIS — Z960 Presence of urogenital implants: Secondary | ICD-10-CM | POA: Diagnosis not present

## 2014-04-19 DIAGNOSIS — Z8546 Personal history of malignant neoplasm of prostate: Secondary | ICD-10-CM | POA: Diagnosis not present

## 2014-04-19 DIAGNOSIS — N3041 Irradiation cystitis with hematuria: Secondary | ICD-10-CM | POA: Diagnosis not present

## 2014-04-19 DIAGNOSIS — Z9079 Acquired absence of other genital organ(s): Secondary | ICD-10-CM | POA: Diagnosis not present

## 2014-04-19 DIAGNOSIS — Z87891 Personal history of nicotine dependence: Secondary | ICD-10-CM | POA: Diagnosis not present

## 2014-04-22 DIAGNOSIS — N3041 Irradiation cystitis with hematuria: Secondary | ICD-10-CM | POA: Diagnosis not present

## 2014-04-22 DIAGNOSIS — Z9079 Acquired absence of other genital organ(s): Secondary | ICD-10-CM | POA: Diagnosis not present

## 2014-04-22 DIAGNOSIS — Z960 Presence of urogenital implants: Secondary | ICD-10-CM | POA: Diagnosis not present

## 2014-04-22 DIAGNOSIS — Z87891 Personal history of nicotine dependence: Secondary | ICD-10-CM | POA: Diagnosis not present

## 2014-04-22 DIAGNOSIS — Z8546 Personal history of malignant neoplasm of prostate: Secondary | ICD-10-CM | POA: Diagnosis not present

## 2014-04-22 DIAGNOSIS — Z923 Personal history of irradiation: Secondary | ICD-10-CM | POA: Diagnosis not present

## 2014-04-23 DIAGNOSIS — Z8546 Personal history of malignant neoplasm of prostate: Secondary | ICD-10-CM | POA: Diagnosis not present

## 2014-04-23 DIAGNOSIS — N3041 Irradiation cystitis with hematuria: Secondary | ICD-10-CM | POA: Diagnosis not present

## 2014-04-23 DIAGNOSIS — Z9079 Acquired absence of other genital organ(s): Secondary | ICD-10-CM | POA: Diagnosis not present

## 2014-04-23 DIAGNOSIS — Z923 Personal history of irradiation: Secondary | ICD-10-CM | POA: Diagnosis not present

## 2014-04-23 DIAGNOSIS — Z960 Presence of urogenital implants: Secondary | ICD-10-CM | POA: Diagnosis not present

## 2014-04-23 DIAGNOSIS — Z87891 Personal history of nicotine dependence: Secondary | ICD-10-CM | POA: Diagnosis not present

## 2014-04-24 DIAGNOSIS — Z923 Personal history of irradiation: Secondary | ICD-10-CM | POA: Diagnosis not present

## 2014-04-24 DIAGNOSIS — Z960 Presence of urogenital implants: Secondary | ICD-10-CM | POA: Diagnosis not present

## 2014-04-24 DIAGNOSIS — Z8546 Personal history of malignant neoplasm of prostate: Secondary | ICD-10-CM | POA: Diagnosis not present

## 2014-04-24 DIAGNOSIS — Z87891 Personal history of nicotine dependence: Secondary | ICD-10-CM | POA: Diagnosis not present

## 2014-04-24 DIAGNOSIS — Z9079 Acquired absence of other genital organ(s): Secondary | ICD-10-CM | POA: Diagnosis not present

## 2014-04-24 DIAGNOSIS — N3041 Irradiation cystitis with hematuria: Secondary | ICD-10-CM | POA: Diagnosis not present

## 2014-04-25 DIAGNOSIS — Z9079 Acquired absence of other genital organ(s): Secondary | ICD-10-CM | POA: Diagnosis not present

## 2014-04-25 DIAGNOSIS — N3041 Irradiation cystitis with hematuria: Secondary | ICD-10-CM | POA: Diagnosis not present

## 2014-04-25 DIAGNOSIS — Z87891 Personal history of nicotine dependence: Secondary | ICD-10-CM | POA: Diagnosis not present

## 2014-04-25 DIAGNOSIS — Z8546 Personal history of malignant neoplasm of prostate: Secondary | ICD-10-CM | POA: Diagnosis not present

## 2014-04-25 DIAGNOSIS — Z923 Personal history of irradiation: Secondary | ICD-10-CM | POA: Diagnosis not present

## 2014-04-25 DIAGNOSIS — Z960 Presence of urogenital implants: Secondary | ICD-10-CM | POA: Diagnosis not present

## 2014-04-26 ENCOUNTER — Encounter (HOSPITAL_BASED_OUTPATIENT_CLINIC_OR_DEPARTMENT_OTHER): Payer: Medicare Other | Attending: Plastic Surgery

## 2014-04-26 DIAGNOSIS — N3041 Irradiation cystitis with hematuria: Secondary | ICD-10-CM | POA: Diagnosis not present

## 2014-04-26 DIAGNOSIS — Z8546 Personal history of malignant neoplasm of prostate: Secondary | ICD-10-CM | POA: Diagnosis not present

## 2014-04-29 DIAGNOSIS — Z8546 Personal history of malignant neoplasm of prostate: Secondary | ICD-10-CM | POA: Diagnosis not present

## 2014-04-29 DIAGNOSIS — N3041 Irradiation cystitis with hematuria: Secondary | ICD-10-CM | POA: Diagnosis not present

## 2014-04-30 DIAGNOSIS — N3041 Irradiation cystitis with hematuria: Secondary | ICD-10-CM | POA: Diagnosis not present

## 2014-04-30 DIAGNOSIS — Z8546 Personal history of malignant neoplasm of prostate: Secondary | ICD-10-CM | POA: Diagnosis not present

## 2014-05-01 DIAGNOSIS — Z8546 Personal history of malignant neoplasm of prostate: Secondary | ICD-10-CM | POA: Diagnosis not present

## 2014-05-01 DIAGNOSIS — N3041 Irradiation cystitis with hematuria: Secondary | ICD-10-CM | POA: Diagnosis not present

## 2014-05-02 DIAGNOSIS — N3041 Irradiation cystitis with hematuria: Secondary | ICD-10-CM | POA: Diagnosis not present

## 2014-05-02 DIAGNOSIS — Z8546 Personal history of malignant neoplasm of prostate: Secondary | ICD-10-CM | POA: Diagnosis not present

## 2014-05-02 DIAGNOSIS — R310 Gross hematuria: Secondary | ICD-10-CM | POA: Diagnosis not present

## 2014-05-02 DIAGNOSIS — R32 Unspecified urinary incontinence: Secondary | ICD-10-CM | POA: Diagnosis not present

## 2014-05-03 DIAGNOSIS — N3041 Irradiation cystitis with hematuria: Secondary | ICD-10-CM | POA: Diagnosis not present

## 2014-05-03 DIAGNOSIS — Z8546 Personal history of malignant neoplasm of prostate: Secondary | ICD-10-CM | POA: Diagnosis not present

## 2014-05-06 DIAGNOSIS — N3041 Irradiation cystitis with hematuria: Secondary | ICD-10-CM | POA: Diagnosis not present

## 2014-05-06 DIAGNOSIS — Z8546 Personal history of malignant neoplasm of prostate: Secondary | ICD-10-CM | POA: Diagnosis not present

## 2014-05-07 DIAGNOSIS — N3041 Irradiation cystitis with hematuria: Secondary | ICD-10-CM | POA: Diagnosis not present

## 2014-05-07 DIAGNOSIS — Z8546 Personal history of malignant neoplasm of prostate: Secondary | ICD-10-CM | POA: Diagnosis not present

## 2014-05-08 DIAGNOSIS — Z8546 Personal history of malignant neoplasm of prostate: Secondary | ICD-10-CM | POA: Diagnosis not present

## 2014-05-08 DIAGNOSIS — N3041 Irradiation cystitis with hematuria: Secondary | ICD-10-CM | POA: Diagnosis not present

## 2014-05-09 DIAGNOSIS — N3041 Irradiation cystitis with hematuria: Secondary | ICD-10-CM | POA: Diagnosis not present

## 2014-05-09 DIAGNOSIS — Z8546 Personal history of malignant neoplasm of prostate: Secondary | ICD-10-CM | POA: Diagnosis not present

## 2014-05-10 DIAGNOSIS — N3041 Irradiation cystitis with hematuria: Secondary | ICD-10-CM | POA: Diagnosis not present

## 2014-05-10 DIAGNOSIS — Z8546 Personal history of malignant neoplasm of prostate: Secondary | ICD-10-CM | POA: Diagnosis not present

## 2014-05-13 DIAGNOSIS — N3041 Irradiation cystitis with hematuria: Secondary | ICD-10-CM | POA: Diagnosis not present

## 2014-05-13 DIAGNOSIS — Z8546 Personal history of malignant neoplasm of prostate: Secondary | ICD-10-CM | POA: Diagnosis not present

## 2014-05-14 DIAGNOSIS — N3041 Irradiation cystitis with hematuria: Secondary | ICD-10-CM | POA: Diagnosis not present

## 2014-05-14 DIAGNOSIS — Z8546 Personal history of malignant neoplasm of prostate: Secondary | ICD-10-CM | POA: Diagnosis not present

## 2014-05-15 DIAGNOSIS — N3041 Irradiation cystitis with hematuria: Secondary | ICD-10-CM | POA: Diagnosis not present

## 2014-05-15 DIAGNOSIS — Z8546 Personal history of malignant neoplasm of prostate: Secondary | ICD-10-CM | POA: Diagnosis not present

## 2014-05-16 DIAGNOSIS — Z8546 Personal history of malignant neoplasm of prostate: Secondary | ICD-10-CM | POA: Diagnosis not present

## 2014-05-16 DIAGNOSIS — N3041 Irradiation cystitis with hematuria: Secondary | ICD-10-CM | POA: Diagnosis not present

## 2014-05-17 DIAGNOSIS — N3041 Irradiation cystitis with hematuria: Secondary | ICD-10-CM | POA: Diagnosis not present

## 2014-05-17 DIAGNOSIS — Z8546 Personal history of malignant neoplasm of prostate: Secondary | ICD-10-CM | POA: Diagnosis not present

## 2014-05-20 DIAGNOSIS — Z8546 Personal history of malignant neoplasm of prostate: Secondary | ICD-10-CM | POA: Diagnosis not present

## 2014-05-20 DIAGNOSIS — N3041 Irradiation cystitis with hematuria: Secondary | ICD-10-CM | POA: Diagnosis not present

## 2014-05-21 DIAGNOSIS — Z8546 Personal history of malignant neoplasm of prostate: Secondary | ICD-10-CM | POA: Diagnosis not present

## 2014-05-21 DIAGNOSIS — N3041 Irradiation cystitis with hematuria: Secondary | ICD-10-CM | POA: Diagnosis not present

## 2014-05-22 DIAGNOSIS — N3041 Irradiation cystitis with hematuria: Secondary | ICD-10-CM | POA: Diagnosis not present

## 2014-05-22 DIAGNOSIS — Z8546 Personal history of malignant neoplasm of prostate: Secondary | ICD-10-CM | POA: Diagnosis not present

## 2014-05-23 DIAGNOSIS — N3041 Irradiation cystitis with hematuria: Secondary | ICD-10-CM | POA: Diagnosis not present

## 2014-05-23 DIAGNOSIS — Z8546 Personal history of malignant neoplasm of prostate: Secondary | ICD-10-CM | POA: Diagnosis not present

## 2014-05-24 DIAGNOSIS — N3041 Irradiation cystitis with hematuria: Secondary | ICD-10-CM | POA: Diagnosis not present

## 2014-05-24 DIAGNOSIS — Z8546 Personal history of malignant neoplasm of prostate: Secondary | ICD-10-CM | POA: Diagnosis not present

## 2014-05-27 ENCOUNTER — Encounter (HOSPITAL_BASED_OUTPATIENT_CLINIC_OR_DEPARTMENT_OTHER): Payer: Medicare Other | Attending: Plastic Surgery

## 2014-05-27 DIAGNOSIS — Z8546 Personal history of malignant neoplasm of prostate: Secondary | ICD-10-CM | POA: Insufficient documentation

## 2014-05-27 DIAGNOSIS — N3041 Irradiation cystitis with hematuria: Secondary | ICD-10-CM | POA: Insufficient documentation

## 2014-05-28 DIAGNOSIS — N3041 Irradiation cystitis with hematuria: Secondary | ICD-10-CM | POA: Diagnosis not present

## 2014-05-28 DIAGNOSIS — Z8546 Personal history of malignant neoplasm of prostate: Secondary | ICD-10-CM | POA: Diagnosis not present

## 2014-05-29 DIAGNOSIS — Z8546 Personal history of malignant neoplasm of prostate: Secondary | ICD-10-CM | POA: Diagnosis not present

## 2014-05-29 DIAGNOSIS — N3041 Irradiation cystitis with hematuria: Secondary | ICD-10-CM | POA: Diagnosis not present

## 2014-05-30 DIAGNOSIS — N3041 Irradiation cystitis with hematuria: Secondary | ICD-10-CM | POA: Diagnosis not present

## 2014-05-30 DIAGNOSIS — Z8546 Personal history of malignant neoplasm of prostate: Secondary | ICD-10-CM | POA: Diagnosis not present

## 2014-05-31 DIAGNOSIS — Z8546 Personal history of malignant neoplasm of prostate: Secondary | ICD-10-CM | POA: Diagnosis not present

## 2014-05-31 DIAGNOSIS — N3041 Irradiation cystitis with hematuria: Secondary | ICD-10-CM | POA: Diagnosis not present

## 2014-06-03 DIAGNOSIS — N3041 Irradiation cystitis with hematuria: Secondary | ICD-10-CM | POA: Diagnosis not present

## 2014-06-03 DIAGNOSIS — Z8546 Personal history of malignant neoplasm of prostate: Secondary | ICD-10-CM | POA: Diagnosis not present

## 2014-06-04 DIAGNOSIS — N3041 Irradiation cystitis with hematuria: Secondary | ICD-10-CM | POA: Diagnosis not present

## 2014-06-04 DIAGNOSIS — Z8546 Personal history of malignant neoplasm of prostate: Secondary | ICD-10-CM | POA: Diagnosis not present

## 2014-06-05 DIAGNOSIS — N3041 Irradiation cystitis with hematuria: Secondary | ICD-10-CM | POA: Diagnosis not present

## 2014-06-05 DIAGNOSIS — Z8546 Personal history of malignant neoplasm of prostate: Secondary | ICD-10-CM | POA: Diagnosis not present

## 2014-06-06 DIAGNOSIS — N3041 Irradiation cystitis with hematuria: Secondary | ICD-10-CM | POA: Diagnosis not present

## 2014-06-06 DIAGNOSIS — Z8546 Personal history of malignant neoplasm of prostate: Secondary | ICD-10-CM | POA: Diagnosis not present

## 2014-06-07 DIAGNOSIS — N3041 Irradiation cystitis with hematuria: Secondary | ICD-10-CM | POA: Diagnosis not present

## 2014-06-07 DIAGNOSIS — Z8546 Personal history of malignant neoplasm of prostate: Secondary | ICD-10-CM | POA: Diagnosis not present

## 2014-06-10 DIAGNOSIS — Z8546 Personal history of malignant neoplasm of prostate: Secondary | ICD-10-CM | POA: Diagnosis not present

## 2014-06-10 DIAGNOSIS — N3041 Irradiation cystitis with hematuria: Secondary | ICD-10-CM | POA: Diagnosis not present

## 2014-06-10 DIAGNOSIS — M1711 Unilateral primary osteoarthritis, right knee: Secondary | ICD-10-CM | POA: Diagnosis not present

## 2014-06-11 DIAGNOSIS — D2371 Other benign neoplasm of skin of right lower limb, including hip: Secondary | ICD-10-CM | POA: Diagnosis not present

## 2014-06-11 DIAGNOSIS — M79673 Pain in unspecified foot: Secondary | ICD-10-CM | POA: Diagnosis not present

## 2014-06-11 DIAGNOSIS — N3041 Irradiation cystitis with hematuria: Secondary | ICD-10-CM | POA: Diagnosis not present

## 2014-06-11 DIAGNOSIS — D2372 Other benign neoplasm of skin of left lower limb, including hip: Secondary | ICD-10-CM | POA: Diagnosis not present

## 2014-06-11 DIAGNOSIS — Z8546 Personal history of malignant neoplasm of prostate: Secondary | ICD-10-CM | POA: Diagnosis not present

## 2014-06-17 DIAGNOSIS — C61 Malignant neoplasm of prostate: Secondary | ICD-10-CM | POA: Diagnosis not present

## 2014-06-26 DIAGNOSIS — C61 Malignant neoplasm of prostate: Secondary | ICD-10-CM | POA: Diagnosis not present

## 2014-06-27 ENCOUNTER — Encounter: Payer: Self-pay | Admitting: Nurse Practitioner

## 2014-06-27 ENCOUNTER — Ambulatory Visit (INDEPENDENT_AMBULATORY_CARE_PROVIDER_SITE_OTHER): Payer: Medicare Other | Admitting: Nurse Practitioner

## 2014-06-27 VITALS — BP 142/88 | HR 74 | Temp 97.5°F | Ht 73.0 in | Wt 250.0 lb

## 2014-06-27 DIAGNOSIS — E785 Hyperlipidemia, unspecified: Secondary | ICD-10-CM

## 2014-06-27 DIAGNOSIS — I10 Essential (primary) hypertension: Secondary | ICD-10-CM

## 2014-06-27 DIAGNOSIS — E039 Hypothyroidism, unspecified: Secondary | ICD-10-CM | POA: Diagnosis not present

## 2014-06-27 MED ORDER — LISINOPRIL 20 MG PO TABS: 20.0000 mg | ORAL_TABLET | Freq: Every morning | ORAL | Status: DC

## 2014-06-27 MED ORDER — LEVOTHYROXINE SODIUM 50 MCG PO TABS: ORAL_TABLET | ORAL | Status: DC

## 2014-06-27 NOTE — Addendum Note (Signed)
Addended by: Chevis Pretty on: 06/27/2014 09:12 AM   Modules accepted: Orders

## 2014-06-27 NOTE — Patient Instructions (Signed)

## 2014-06-27 NOTE — Progress Notes (Signed)
  Subjective:    Patient ID: [name], male    DOB: [birth year], 81 y.o.   MRN: [id number]  Patient here today for follow up of chronic medical problems.  Hypertension This is a chronic problem. The current episode started more than 1 year ago. The problem is controlled. Pertinent negatives include no chest pain, headaches, palpitations or shortness of breath. Risk factors for coronary artery disease include dyslipidemia, obesity and sedentary lifestyle. Past treatments include ACE inhibitors and calcium channel blockers. The current treatment provides moderate improvement. Compliance problems include diet and exercise.  Hypertensive end-organ damage includes a thyroid problem. There is no history of CAD/MI or CVA.  Hyperlipidemia This is a chronic problem. The current episode started more than 1 year ago. Recent lipid tests were reviewed and are variable. Exacerbating diseases include obesity. He has no history of diabetes or hypothyroidism. Pertinent negatives include no chest pain or shortness of breath. Current antihyperlipidemic treatment includes diet change. The current treatment provides moderate improvement of lipids. Compliance problems include adherence to diet and adherence to exercise.  Risk factors for coronary artery disease include dyslipidemia, hypertension and post-menopausal.  Thyroid Problem Symptoms include fatigue (occassionally). Patient reports no heat intolerance, palpitations or visual change. His past medical history is significant for hyperlipidemia. There is no history of diabetes.      Review of Systems  Constitutional: Positive for fatigue (occassionally).  Respiratory: Negative for shortness of breath.   Cardiovascular: Negative for chest pain and palpitations.  Endocrine: Negative for heat intolerance.  Neurological: Negative for headaches.  All other systems reviewed and are negative.      Objective:   Physical Exam  Constitutional: He is oriented to  person, place, and time. He appears well-developed and well-nourished.  HENT:  Head: Normocephalic.  Right Ear: External ear normal.  Left Ear: External ear normal.  Nose: Nose normal.  Mouth/Throat: Oropharynx is clear and moist.  Eyes: EOM are normal. Pupils are equal, round, and reactive to light.  Neck: Normal range of motion. Neck supple. No thyromegaly present.  Cardiovascular: Normal rate, regular rhythm, normal heart sounds and intact distal pulses.   No murmur heard. Pulmonary/Chest: Effort normal and breath sounds normal. He has no wheezes. He has no rales.  Abdominal: Soft. Bowel sounds are normal.  Musculoskeletal: Normal range of motion.  Neurological: He is alert and oriented to person, place, and time.  Skin: Skin is warm and dry.  Psychiatric: He has a normal mood and affect. His behavior is normal. Judgment and thought content normal.   . BP 142/88 mmHg  Pulse 74  Temp(Src) 97.5 F (36.4 C) (Oral)  Ht _0  (1.854 m)  Wt 250 lb (113.399 kg)  BMI 32.99 kg/m2         Assessment & Plan:  1. Essential hypertension Do not add salt to diet - lisinopril (PRINIVIL,ZESTRIL) 20 MG tablet; Take 1 tablet (20 mg total) by mouth every morning.  Dispense: 90 tablet; Refill: 1 - CMP14+EGFR  2. Hyperlipidemia Low fat diet - NMR, lipoprofile  3. Hypothyroidism, unspecified hypothyroidism type - levothyroxine (SYNTHROID, LEVOTHROID) 50 MCG tablet; TAKE 1 TABLET (50 MCG TOTAL) BY MOUTH DAILY BEFORE BREAKFAST.  Dispense: 90 tablet; Refill: 1    Labs pending Health maintenance reviewed Diet and exercise encouraged Continue all meds Follow up  In 3 months   [place], FNP

## 2014-06-28 ENCOUNTER — Other Ambulatory Visit: Payer: Self-pay | Admitting: *Deleted

## 2014-06-28 LAB — NMR, LIPOPROFILE
Cholesterol: 161 mg/dL (ref 100–199)
HDL Cholesterol by NMR: 49 mg/dL (ref >39)
HDL Particle Number: 34.4 umol/L (ref >=30.5)
LDL PARTICLE NUMBER: 1063 nmol/L — AB (ref <1000)
LDL SIZE: 20.8 nm (ref >20.5)
LDL-C: 79 mg/dL (ref 0–99)
LP-IR SCORE: 51 — AB (ref <=45)
Small LDL Particle Number: 357 nmol/L (ref <=527)
TRIGLYCERIDES BY NMR: 167 mg/dL — AB (ref 0–149)

## 2014-06-28 LAB — CMP14+EGFR
ALBUMIN: 4.5 g/dL (ref 3.5–4.8)
ALK PHOS: 61 IU/L (ref 39–117)
ALT: 8 IU/L (ref 0–44)
AST: 15 IU/L (ref 0–40)
Albumin/Globulin Ratio: 1.9 (ref 1.1–2.5)
BUN/Creatinine Ratio: 22 (ref 10–22)
BUN: 23 mg/dL (ref 8–27)
Bilirubin Total: 0.4 mg/dL (ref 0.0–1.2)
CALCIUM: 9.9 mg/dL (ref 8.6–10.2)
CO2: 24 mmol/L (ref 18–29)
Chloride: 102 mmol/L (ref 97–108)
Creatinine, Ser: 1.06 mg/dL (ref 0.76–1.27)
GFR calc non Af Amer: 66 mL/min/{1.73_m2} (ref >59)
GFR, EST AFRICAN AMERICAN: 77 mL/min/{1.73_m2} (ref >59)
Globulin, Total: 2.4 g/dL (ref 1.5–4.5)
Glucose: 90 mg/dL (ref 65–99)
POTASSIUM: 5.3 mmol/L — AB (ref 3.5–5.2)
SODIUM: 140 mmol/L (ref 134–144)
TOTAL PROTEIN: 6.9 g/dL (ref 6.0–8.5)

## 2014-06-28 MED ORDER — MIRABEGRON ER 25 MG PO TB24: 25.0000 mg | ORAL_TABLET | Freq: Every day | ORAL | Status: DC

## 2014-07-08 ENCOUNTER — Ambulatory Visit: Payer: PRIVATE HEALTH INSURANCE | Admitting: Nurse Practitioner

## 2014-07-25 DIAGNOSIS — H2512 Age-related nuclear cataract, left eye: Secondary | ICD-10-CM | POA: Diagnosis not present

## 2014-08-05 DIAGNOSIS — H2512 Age-related nuclear cataract, left eye: Secondary | ICD-10-CM | POA: Diagnosis not present

## 2014-08-05 DIAGNOSIS — H25012 Cortical age-related cataract, left eye: Secondary | ICD-10-CM | POA: Diagnosis not present

## 2014-08-05 DIAGNOSIS — Z961 Presence of intraocular lens: Secondary | ICD-10-CM | POA: Diagnosis not present

## 2014-08-05 DIAGNOSIS — H35033 Hypertensive retinopathy, bilateral: Secondary | ICD-10-CM | POA: Diagnosis not present

## 2014-08-08 ENCOUNTER — Other Ambulatory Visit: Payer: Self-pay | Admitting: Nurse Practitioner

## 2014-08-13 ENCOUNTER — Telehealth: Payer: Self-pay | Admitting: Nurse Practitioner

## 2014-08-13 NOTE — Telephone Encounter (Signed)
Patient wants to know why he needs surgical clearance from cardiologist when he has never seen one before.

## 2014-08-13 NOTE — Telephone Encounter (Signed)
Patient should be okay for cataract surgery

## 2014-08-13 NOTE — Telephone Encounter (Signed)
Patient will have eye surgery center fax [place] a new surgical clearance form.

## 2014-08-20 DIAGNOSIS — H2512 Age-related nuclear cataract, left eye: Secondary | ICD-10-CM | POA: Diagnosis not present

## 2014-09-12 ENCOUNTER — Other Ambulatory Visit: Payer: Self-pay | Admitting: Nurse Practitioner

## 2014-12-09 DIAGNOSIS — Z23 Encounter for immunization: Secondary | ICD-10-CM | POA: Diagnosis not present

## 2014-12-23 DIAGNOSIS — E291 Testicular hypofunction: Secondary | ICD-10-CM | POA: Diagnosis not present

## 2014-12-23 DIAGNOSIS — C61 Malignant neoplasm of prostate: Secondary | ICD-10-CM | POA: Diagnosis not present

## 2014-12-26 DIAGNOSIS — R32 Unspecified urinary incontinence: Secondary | ICD-10-CM | POA: Diagnosis not present

## 2014-12-26 DIAGNOSIS — C61 Malignant neoplasm of prostate: Secondary | ICD-10-CM | POA: Diagnosis not present

## 2014-12-26 DIAGNOSIS — N3040 Irradiation cystitis without hematuria: Secondary | ICD-10-CM | POA: Diagnosis not present

## 2014-12-27 ENCOUNTER — Ambulatory Visit (INDEPENDENT_AMBULATORY_CARE_PROVIDER_SITE_OTHER): Payer: Medicare Other | Admitting: Nurse Practitioner

## 2014-12-27 ENCOUNTER — Encounter: Payer: Self-pay | Admitting: Nurse Practitioner

## 2014-12-27 VITALS — BP 136/84 | HR 67 | Temp 97.0°F | Ht 73.0 in | Wt 253.0 lb

## 2014-12-27 DIAGNOSIS — E039 Hypothyroidism, unspecified: Secondary | ICD-10-CM

## 2014-12-27 DIAGNOSIS — I10 Essential (primary) hypertension: Secondary | ICD-10-CM | POA: Diagnosis not present

## 2014-12-27 DIAGNOSIS — Z6833 Body mass index (BMI) 33.0-33.9, adult: Secondary | ICD-10-CM

## 2014-12-27 DIAGNOSIS — E669 Obesity, unspecified: Secondary | ICD-10-CM | POA: Insufficient documentation

## 2014-12-27 DIAGNOSIS — E785 Hyperlipidemia, unspecified: Secondary | ICD-10-CM

## 2014-12-27 MED ORDER — LISINOPRIL 20 MG PO TABS: 20.0000 mg | ORAL_TABLET | Freq: Every morning | ORAL | Status: DC

## 2014-12-27 MED ORDER — LEVOTHYROXINE SODIUM 50 MCG PO TABS: ORAL_TABLET | ORAL | Status: DC

## 2014-12-27 MED ORDER — AMLODIPINE BESYLATE 5 MG PO TABS: 5.0000 mg | ORAL_TABLET | Freq: Every morning | ORAL | Status: DC

## 2014-12-27 NOTE — Patient Instructions (Signed)

## 2014-12-27 NOTE — Progress Notes (Signed)
Subjective:    Patient ID: [name], male    DOB: 18-Aug-1932, 81 y.o.   MRN: [id number]  Patient here today for follow up of chronic medical problems.  Hypertension This is a chronic problem. The current episode started more than 1 year ago. The problem is controlled. Pertinent negatives include no chest pain, headaches, palpitations or shortness of breath. Risk factors for coronary artery disease include dyslipidemia, obesity and sedentary lifestyle. Past treatments include ACE inhibitors and calcium channel blockers. The current treatment provides moderate improvement. Compliance problems include diet and exercise.  Hypertensive end-organ damage includes a thyroid problem. There is no history of CAD/MI or CVA.  Hyperlipidemia This is a chronic problem. The current episode started more than 1 year ago. Recent lipid tests were reviewed and are variable. Exacerbating diseases include obesity. He has no history of diabetes or hypothyroidism. Pertinent negatives include no chest pain or shortness of breath. Current antihyperlipidemic treatment includes diet change. The current treatment provides moderate improvement of lipids. Compliance problems include adherence to diet and adherence to exercise.  Risk factors for coronary artery disease include dyslipidemia, hypertension and post-menopausal.  Thyroid Problem Symptoms include fatigue (occassionally). Patient reports no heat intolerance, palpitations or visual change. His past medical history is significant for hyperlipidemia. There is no history of diabetes.      Review of Systems  Constitutional: Positive for fatigue (occassionally).  Eyes: Negative.   Respiratory: Negative for shortness of breath.   Cardiovascular: Negative for chest pain and palpitations.  Endocrine: Negative for heat intolerance.  Genitourinary: Negative.   Neurological: Negative for headaches.  Psychiatric/Behavioral: Negative.   All other systems reviewed and are  negative.      Objective:   Physical Exam  Constitutional: He is oriented to person, place, and time. He appears well-developed and well-nourished.  HENT:  Head: Normocephalic.  Right Ear: External ear normal.  Left Ear: External ear normal.  Nose: Nose normal.  Mouth/Throat: Oropharynx is clear and moist.  Eyes: EOM are normal. Pupils are equal, round, and reactive to light.  Neck: Normal range of motion. Neck supple. No thyromegaly present.  Cardiovascular: Normal rate, regular rhythm, normal heart sounds and intact distal pulses.   No murmur heard. Pulmonary/Chest: Effort normal and breath sounds normal. He has no wheezes. He has no rales.  Abdominal: Soft. Bowel sounds are normal.  Musculoskeletal: Normal range of motion.  Neurological: He is alert and oriented to person, place, and time.  Skin: Skin is warm and dry.  Psychiatric: He has a normal mood and affect. His behavior is normal. Judgment and thought content normal.    BP 136/84 mmHg  Pulse 67  Temp(Src) 97 F (36.1 C) (Oral)  Ht 6' 1" (1.854 m)  Wt 253 lb (114.76 kg)  BMI 33.39 kg/m2       Assessment & Plan:   1. Essential hypertension Do not add salt in diet - lisinopril (PRINIVIL,ZESTRIL) 20 MG tablet; Take 1 tablet (20 mg total) by mouth every morning.  Dispense: 90 tablet; Refill: 0 - amLODipine (NORVASC) 5 MG tablet; Take 1 tablet (5 mg total) by mouth every morning.  Dispense: 90 tablet; Refill: 1 - CMP14+EGFR  2. Hyperlipidemia Low fat diet - Lipid panel  3. Hypothyroidism, unspecified hypothyroidism type - levothyroxine (SYNTHROID, LEVOTHROID) 50 MCG tablet; TAKE 1 TABLET (50 MCG TOTAL) BY MOUTH DAILY BEFORE BREAKFAST.  Dispense: 90 tablet; Refill: 1  4. BMI 33.0-33.9,adult Discussed diet and exercise for person with BMI >25 Will recheck weight  in 3-6 months     Labs pending Health maintenance reviewed Diet and exercise encouraged Continue all meds Follow up  In 3 month    [place], FNP

## 2014-12-28 LAB — CMP14+EGFR
ALT: 11 IU/L (ref 0–44)
AST: 18 IU/L (ref 0–40)
Albumin/Globulin Ratio: 1.8 (ref 1.1–2.5)
Albumin: 4.3 g/dL (ref 3.5–4.7)
Alkaline Phosphatase: 74 IU/L (ref 39–117)
BUN/Creatinine Ratio: 18 (ref 10–22)
BUN: 17 mg/dL (ref 8–27)
Bilirubin Total: 0.5 mg/dL (ref 0.0–1.2)
CO2: 22 mmol/L (ref 18–29)
Calcium: 9.7 mg/dL (ref 8.6–10.2)
Chloride: 102 mmol/L (ref 97–106)
Creatinine, Ser: 0.94 mg/dL (ref 0.76–1.27)
GFR calc Af Amer: 88 mL/min/{1.73_m2} (ref >59)
GFR calc non Af Amer: 76 mL/min/{1.73_m2} (ref >59)
GLUCOSE: 99 mg/dL (ref 65–99)
Globulin, Total: 2.4 g/dL (ref 1.5–4.5)
Potassium: 5.4 mmol/L — ABNORMAL HIGH (ref 3.5–5.2)
Sodium: 139 mmol/L (ref 136–144)
TOTAL PROTEIN: 6.7 g/dL (ref 6.0–8.5)

## 2014-12-28 LAB — LIPID PANEL
Chol/HDL Ratio: 3.2 ratio units (ref 0.0–5.0)
Cholesterol, Total: 164 mg/dL (ref 100–199)
HDL: 51 mg/dL (ref >39)
LDL CALC: 82 mg/dL (ref 0–99)
Triglycerides: 155 mg/dL — ABNORMAL HIGH (ref 0–149)
VLDL CHOLESTEROL CAL: 31 mg/dL (ref 5–40)

## 2014-12-30 ENCOUNTER — Other Ambulatory Visit: Payer: Medicare Other

## 2014-12-30 DIAGNOSIS — E875 Hyperkalemia: Secondary | ICD-10-CM

## 2014-12-30 DIAGNOSIS — R799 Abnormal finding of blood chemistry, unspecified: Secondary | ICD-10-CM

## 2014-12-30 NOTE — Addendum Note (Signed)
Addended by: Michaela Corner on: 12/30/2014 05:45 PM   Modules accepted: Orders

## 2014-12-31 ENCOUNTER — Other Ambulatory Visit: Payer: Medicare Other

## 2014-12-31 DIAGNOSIS — R799 Abnormal finding of blood chemistry, unspecified: Secondary | ICD-10-CM | POA: Diagnosis not present

## 2015-01-01 ENCOUNTER — Other Ambulatory Visit: Payer: Self-pay | Admitting: Nurse Practitioner

## 2015-01-01 LAB — POTASSIUM: Potassium: 6.2 mmol/L (ref 3.5–5.2)

## 2015-01-01 MED ORDER — SODIUM POLYSTYRENE SULFONATE PO POWD: Freq: Once | ORAL | Status: DC

## 2015-01-02 ENCOUNTER — Other Ambulatory Visit (INDEPENDENT_AMBULATORY_CARE_PROVIDER_SITE_OTHER): Payer: Medicare Other

## 2015-01-02 DIAGNOSIS — I10 Essential (primary) hypertension: Secondary | ICD-10-CM | POA: Diagnosis not present

## 2015-01-02 NOTE — Progress Notes (Signed)
Lab only 

## 2015-01-03 ENCOUNTER — Other Ambulatory Visit: Payer: Self-pay | Admitting: Nurse Practitioner

## 2015-01-03 LAB — BMP8+EGFR
BUN/Creatinine Ratio: 20 (ref 10–22)
BUN: 17 mg/dL (ref 8–27)
CO2: 24 mmol/L (ref 18–29)
CREATININE: 0.87 mg/dL (ref 0.76–1.27)
Calcium: 9.1 mg/dL (ref 8.6–10.2)
Chloride: 104 mmol/L (ref 97–106)
GFR calc Af Amer: 94 mL/min/{1.73_m2} (ref >59)
GFR, EST NON AFRICAN AMERICAN: 82 mL/min/{1.73_m2} (ref >59)
GLUCOSE: 90 mg/dL (ref 65–99)
Potassium: 5.6 mmol/L — ABNORMAL HIGH (ref 3.5–5.2)
SODIUM: 143 mmol/L (ref 136–144)

## 2015-01-03 MED ORDER — SODIUM POLYSTYRENE SULFONATE PO POWD: Freq: Once | ORAL | Status: DC

## 2015-01-06 ENCOUNTER — Other Ambulatory Visit (INDEPENDENT_AMBULATORY_CARE_PROVIDER_SITE_OTHER): Payer: Medicare Other

## 2015-01-06 DIAGNOSIS — E875 Hyperkalemia: Secondary | ICD-10-CM

## 2015-01-06 DIAGNOSIS — L821 Other seborrheic keratosis: Secondary | ICD-10-CM | POA: Diagnosis not present

## 2015-01-06 DIAGNOSIS — L570 Actinic keratosis: Secondary | ICD-10-CM | POA: Diagnosis not present

## 2015-01-06 NOTE — Progress Notes (Signed)
Lab only 

## 2015-01-07 LAB — BMP8+EGFR
BUN / CREAT RATIO: 16 (ref 10–22)
BUN: 13 mg/dL (ref 8–27)
CHLORIDE: 102 mmol/L (ref 96–106)
CO2: 24 mmol/L (ref 18–29)
Calcium: 9.5 mg/dL (ref 8.6–10.2)
Creatinine, Ser: 0.8 mg/dL (ref 0.76–1.27)
GFR calc Af Amer: 98 mL/min/{1.73_m2} (ref >59)
GFR calc non Af Amer: 84 mL/min/{1.73_m2} (ref >59)
GLUCOSE: 97 mg/dL (ref 65–99)
POTASSIUM: 4.6 mmol/L (ref 3.5–5.2)
SODIUM: 140 mmol/L (ref 134–144)

## 2015-01-10 ENCOUNTER — Other Ambulatory Visit (INDEPENDENT_AMBULATORY_CARE_PROVIDER_SITE_OTHER): Payer: Medicare Other

## 2015-01-10 DIAGNOSIS — E875 Hyperkalemia: Secondary | ICD-10-CM

## 2015-01-10 NOTE — Progress Notes (Signed)
Lab only 

## 2015-01-11 LAB — POTASSIUM: Potassium: 5 mmol/L (ref 3.5–5.2)

## 2015-01-16 ENCOUNTER — Encounter: Payer: Self-pay | Admitting: Pharmacist

## 2015-01-16 ENCOUNTER — Ambulatory Visit (INDEPENDENT_AMBULATORY_CARE_PROVIDER_SITE_OTHER): Payer: Medicare Other | Admitting: Pharmacist

## 2015-01-16 VITALS — BP 136/82 | HR 70 | Ht 73.0 in | Wt 254.0 lb

## 2015-01-16 DIAGNOSIS — Z0000 Encounter for general adult medical examination without abnormal findings: Secondary | ICD-10-CM

## 2015-01-16 DIAGNOSIS — E875 Hyperkalemia: Secondary | ICD-10-CM

## 2015-01-16 NOTE — Progress Notes (Signed)
Patient ID: [name], male   DOB: 11/25/1932, 81 y.o.   MRN: [id number]    Subjective:   [name] is a 81 y.o. white male who presents for a subsequent  Medicare Annual Wellness Visit. [name] is married and lives with his wife in [place].  He is retired from working for [employer].  He has recently seen his PCP.  His BMET revealed elevated potassium which has been a fluctuating condition over the last 12 months.  He has been off lisinopril which can increase potassium for the last 2 weeks.  He increased amlodipine from 2.'5mg'$  to '5mg'$  daily and has tolerated well without increased dizziness.  Patient also has seen urologist Dr Karsten Ro recently.  Dr Karsten Ro is considering adding a new medication called Xtandi for prostate cancer due to increases in PSA and hematuria.    Review of Systems  Review of Systems  Constitutional: Negative.   HENT: Negative.   Eyes: Negative.   Respiratory: Negative.   Cardiovascular: Negative.   Gastrointestinal: Negative.   Genitourinary: Positive for frequency and hematuria (just saw Dr Karsten Ro about this about 2 weeks ago). Negative for dysuria, urgency and flank pain.  Musculoskeletal: Negative.   Skin: Negative.   Neurological: Negative.   Endo/Heme/Allergies: Negative.   Psychiatric/Behavioral: Negative.       Current Medications (verified) Outpatient Encounter Prescriptions as of 01/16/2015  Medication Sig  . amLODipine (NORVASC) 5 MG tablet Take 1 tablet (5 mg total) by mouth every morning.  . Calcium Carb-Cholecalciferol (CALCIUM 600 + D PO) Take 1 tablet by mouth daily after lunch.   . Cholecalciferol (VITAMIN D3) 5000 UNITS CAPS Take 1 capsule by mouth daily.  . Coenzyme Q10-Red Yeast Rice 60-600 MG CAPS Take 2 capsules by mouth daily. THIS PILL ALSO HAS '100MG'$  NIACIN  . diphenhydrAMINE (BENADRYL) 25 MG tablet Take 25 mg by mouth every 6 (six) hours as needed for itching.  Marland Kitchen FLAXSEED, LINSEED, PO Take 30 mLs by mouth every  morning. GRINDS FLAX SEEDS AND PUTS IN 1/4 CUP OF WATER  . ibuprofen (ADVIL,MOTRIN) 200 MG tablet Take 400 mg by mouth every 6 (six) hours as needed for moderate pain.  . Incontinence Supply Disposable (INCONTINENCE BRIEF LARGE) MISC 1 each by Does not apply route 5 (five) times daily.  Marland Kitchen Leuprolide Acetate, 6 Month, (LUPRON DEPOT) 45 MG injection Inject 45 mg into the muscle every 6 (six) months.   . levothyroxine (SYNTHROID, LEVOTHROID) 50 MCG tablet TAKE 1 TABLET (50 MCG TOTAL) BY MOUTH DAILY BEFORE BREAKFAST.  . Magnesium 300 MG CAPS Take 1 capsule by mouth daily.  . Probiotic Product (PROBIOTIC DAILY PO) Take 1 capsule by mouth daily.  . [DISCONTINUED] lisinopril (PRINIVIL,ZESTRIL) 20 MG tablet Take 1 tablet (20 mg total) by mouth every morning. (Patient not taking: Reported on 01/16/2015)  . [DISCONTINUED] sodium polystyrene (KAYEXALATE) powder Take by mouth once. (Patient not taking: Reported on 01/16/2015)   No facility-administered encounter medications on file as of 01/16/2015.    Allergies (verified) Caffeine and Codeine   History: Past Medical History  Diagnosis Date  . BPPV (benign paroxysmal positional vertigo)   . Hypertension   . Vitamin D deficiency   . Hypothyroidism   . Diverticulosis   . History of prostate cancer CURRENTLY  ELEVATED PSA--  LUPRON INJECTIONS    S/P PROSTATECTOMY AND RADIATION THERAPY  1997  . History of hemorrhagic cystitis     SECONDARY RADIATION THERAPY 1997  . BNC (bladder neck contracture)   .  Foley catheter in place   . Radiation proctitis   . H/O diarrhea SECONDARY TO RADIATION THERAPY --  INTERMITANT  DIARRHEA  . Radiation cystitis   . Foley catheter in place   . Hematuria   . Urinary retention   . Hyperlipidemia   . Cataract     right   Past Surgical History  Procedure Laterality Date  . Prostatectomy  1997  . Colonoscopy w/ polypectomy  2005; 08/11/2010    2005: 1 cm hyperplastic sigmoid polyp, Dr. Wynetta Emery 2012: hyperplastic  splenic flexure polyp -1 cm, diverticulosis, radiation proctitis, anal stenosis  . Cysto/ fulgeration of bleeders/ resection bladder neck contracture  07-06-2004    BNC AND RADIATION CYSTITIS  . Cataract extraction w/ intraocular lens implant Right   . Lumbar disc surgery  1996    L4 -- L5  . Transurethral resection of bladder neck N/A 06/23/2012    Procedure: TRANSURETHRAL RESECTION OF BLADDER NECK CONTRACTURE WITH GYRUS;  Surgeon: Claybon Jabs, MD;  Location[facility];  Service: Urology;  Laterality: N/A;  . Transurethral resection of bladder tumor with gyrus (turbt-gyrus) N/A 06/01/2013    Procedure: TRANSURETHRAL RESECTION OF BLADDER NECK CONTRACTURE WITH GYRUS (TURBT-GYRUS) AND INJECTION OF KENALOG;  Surgeon: Claybon Jabs, MD;  Location: [facility];  Service: Urology;  Laterality: N/A;  . Eye surgery    . Spine surgery     Family History  Problem Relation Age of Onset  . Prostate cancer Brother   . Kidney disease Brother   . Congestive Heart Failure Mother   . Pancreatitis Father   . Pneumonia Father   . Cancer Sister     breast  . Obesity Sister   . Early death Sister     appendicitis  . Cancer Sister     breast  . Alzheimer's disease Brother   . Hip fracture Brother    Social History   Occupational History  . Not on file.   Social History Main Topics  . Smoking status: Former Smoker -- 20 years    Types: Cigarettes    Quit date: 07/28/1979  . Smokeless tobacco: Former Systems developer    Types: [place] date: 07/28/1979     Comment: SMOKED AND CHEW TOBACCO FOR 20 YRS QUIT AGE 17  . Alcohol Use: No  . Drug Use: No  . Sexual Activity: No    Do you feel safe at home?  No  Dietary issues and exercise activities discussed: Current Exercise Habits:: Home exercise routine, Type of exercise: walking, Time (Minutes): 30, Frequency (Times/Week): 5, Weekly Exercise (Minutes/Week): 150, Intensity: Moderate  Current Dietary habits:  Patient  has been trying to limit high potassium foods due to hyperkalemia.  He is not following any other specific dietary recommendations.   Cardiac Risk Factors include: advanced age (>73mn, >>68women);dyslipidemia;hypertension;male gender;obesity (BMI >30kg/m2)  Objective:    Today's Vitals   01/16/15 0822  BP: 136/82  Pulse: 70  Height: '6\' 1"'$  (1.854 m)  Weight: 254 lb (115.214 kg)  PainSc: 0-No pain   Body mass index is 33.52 kg/(m^2).   Activities of Daily Living In your present state of health, do you have any difficulty performing the following activities: 01/16/2015  Hearing? N  Vision? N  Difficulty concentrating or making decisions? N  Walking or climbing stairs? N  Dressing or bathing? N  Doing errands, shopping? N  Preparing Food and eating ? N  Using the Toilet? N  In  the past six months, have you accidently leaked urine? Y  Do you have problems with loss of bowel control? N  Managing your Medications? N  Managing your Finances? N  Housekeeping or managing your Housekeeping? N    Are there smokers in your home (other than you)? No    Depression Screen PHQ 2/9 Scores 01/16/2015 12/27/2014 06/27/2014 01/14/2014  PHQ - 2 Score 0 0 0 0    Fall Risk Fall Risk  01/16/2015 12/27/2014 06/27/2014 01/14/2014 01/07/2014  Falls in the past year? No No No No No    Cognitive Function: MMSE - Mini Mental State Exam 01/16/2015  Orientation to time 5  Orientation to Place 5  Registration 3  Attention/ Calculation 3  Recall 3  Language- name 2 objects 2  Language- repeat 1  Language- follow 3 step command 3  Language- read & follow direction 1  Write a sentence 1  Copy design 0  Total score 27    Immunizations and Health Maintenance Immunization History  Administered Date(s) Administered  . Hepatitis B 07/22/1993, 08/26/1993, 04/28/1994  . Influenza,inj,Quad PF,36+ Mos 11/07/2013  . Influenza-Unspecified 11/25/2012, 12/09/2014  . Pneumococcal Conjugate-13 01/14/2014   . Pneumococcal Polysaccharide-23 07/04/2012  . Zoster 01/05/2013   There are no preventive care reminders to display for this patient.  Patient Care Team: Chevis Pretty, FNP as PCP - General (Nurse Practitioner) Kathie Rhodes, MD as Consulting Physician (Urology) Sandford Craze, MD as Consulting Physician (Dermatology)  Indicate any recent Medical Services you may have received from other than [facility] providers in the past year (date may be approximate).    Assessment:    Annual Wellness Visit  Hyperkalemia Hypertriglyceridemia  Screening Tests Health Maintenance  Topic Date Due  . TETANUS/TDAP  06/27/2015 (Originally 06/28/1951)  . INFLUENZA VACCINE  08/26/2015  . ZOSTAVAX  Completed  . PNA vac Low Risk Adult  Completed        Plan:   During the course of the visit Deontaye was educated and counseled about the following appropriate screening and preventive services:   Patient is UTD on the following vaccines: Pneumoccal, Influenza, Hepatitis B and Zostavax  Patient is due Tdap but declined today due to cost ($45)  Colorectal cancer screening - FOBT given in office today;  Per note from Dr Carlean Purl from 2012 patient doesn't need repeat colonoscopy unless he is having GI related symptoms to indicate need for colonosocpy  BP was at goal today   Lipids at goal at last check except triglycerides slightly elevated - discussed dietary changes to help decrease both weight and triglycerides  Diabetes screening - last FBG was WNL  Bone Denisty / Osteoporosis Screening - This is usually ordered by his urologist and done in their office due to patient taking Lupron.  I will request copy of last DEXA and sent note about rechecking.  Glaucoma screening  - UTD  Nutrition counseling - Discussed dietary changes to help lower potassium and triglycerides  Prostate cancer screening - UTD, sees urologist regularly due to history of prostate CA  Advanced Directives - UTD, copy  requested  Continue amlodipine '5mg'$  daily for BP  RTC 01/24/2015 to check potassium - order placed    Patient Instructions (the written plan) were given to the patient.   Cherre Robins, [facility]   01/16/2015

## 2015-01-16 NOTE — Patient Instructions (Addendum)
[name] , Thank you for taking time to come for your Medicare Wellness Visit. I appreciate your ongoing commitment to your health goals. Please review the following plan we discussed and let me know if I can assist you in the future.   These are the goals we discussed: Return to clinic 01/24/2015 to recheck potassium Return stool test as soon as possible Continue to exercise 5 days per week - great job! Continue to limit high potassium and high calorie foods - no sugar containing beverages.  Increase non-starchy vegetables - carrots, green bean, squash, zucchini, tomatoes, onions, peppers, spinach and other green leafy vegetables, cabbage, lettuce, cucumbers, asparagus, okra (not fried), eggplant limit sugar and processed foods (cakes, cookies, ice cream, crackers and chips) limit red meat to no more than 1-2 times per week (serving size about the size of your palm) Choose whole grains / lean proteins - whole wheat bread, quinoa, whole grain rice (1/2 cup), fish, chicken, [place]     This is a list of the screening recommended for you and due dates:  Health Maintenance  Topic Date Due  . Tetanus Vaccine  06/27/2015*  . Flu Shot  08/26/2015  . Shingles Vaccine  Completed  . Pneumonia vaccines  Completed  *Topic was postponed. The date shown is not the original due date.    Health Maintenance, Male A healthy lifestyle and preventative care can promote health and wellness.  Maintain regular health, dental, and eye exams.  Eat a healthy diet. Foods like vegetables, fruits, whole grains, low-fat dairy products, and lean protein foods contain the nutrients you need and are low in calories. Decrease your intake of foods high in solid fats, added sugars, and salt. Get information about a proper diet from your health care provider, if necessary.  Regular physical exercise is one of the most important things you can do for your health. Most adults should get at least 150 minutes of  moderate-intensity exercise (any activity that increases your heart rate and causes you to sweat) each week. In addition, most adults need muscle-strengthening exercises on 2 or more days a week.   Maintain a healthy weight. The body mass index (BMI) is a screening tool to identify possible weight problems. It provides an estimate of body fat based on height and weight. Your health care provider can find your BMI and can help you achieve or maintain a healthy weight. For males 20 years and older:  A BMI below 18.5 is considered underweight.  A BMI of 18.5 to 24.9 is normal.  A BMI of 25 to 29.9 is considered overweight.  A BMI of 30 and above is considered obese.  Maintain normal blood lipids and cholesterol by exercising and minimizing your intake of saturated fat. Eat a balanced diet with plenty of fruits and vegetables. Blood tests for lipids and cholesterol should begin at age 43 and be repeated every 5 years. If your lipid or cholesterol levels are high, you are over age 63, or you are at high risk for heart disease, you may need your cholesterol levels checked more frequently.Ongoing high lipid and cholesterol levels should be treated with medicines if diet and exercise are not working.  If you smoke, find out from your health care provider how to quit. If you do not use tobacco, do not start.  Lung cancer screening is recommended for adults aged 28-80 years who are at high risk for developing lung cancer because of a history of smoking. A yearly low-dose CT  scan of the lungs is recommended for people who have at least a 30-pack-year history of smoking and are current smokers or have quit within the past 15 years. A pack year of smoking is smoking an average of 1 pack of cigarettes a day for 1 year (for example, a 30-pack-year history of smoking could mean smoking 1 pack a day for 30 years or 2 packs a day for 15 years). Yearly screening should continue until the smoker has stopped smoking  for at least 15 years. Yearly screening should be stopped for people who develop a health problem that would prevent them from having lung cancer treatment.  If you choose to drink alcohol, do not have more than 2 drinks per day. One drink is considered to be 12 oz (360 mL) of beer, 5 oz (150 mL) of wine, or 1.5 oz (45 mL) of liquor.  Avoid the use of street drugs. Do not share needles with anyone. Ask for help if you need support or instructions about stopping the use of drugs.  High blood pressure causes heart disease and increases the risk of stroke. High blood pressure is more likely to develop in:  People who have blood pressure in the end of the normal range (100-139/85-89 mm Hg).  People who are overweight or obese.  People who are African American.  If you are 20-75 years of age, have your blood pressure checked every 3-5 years. If you are 85 years of age or older, have your blood pressure checked every year. You should have your blood pressure measured twice--once when you are at a hospital or clinic, and once when you are not at a hospital or clinic. Record the average of the two measurements. To check your blood pressure when you are not at a hospital or clinic, you can use:  An automated blood pressure machine at a pharmacy.  A home blood pressure monitor.  If you are 2-55 years old, ask your health care provider if you should take aspirin to prevent heart disease.  Diabetes screening involves taking a blood sample to check your fasting blood sugar level. This should be done once every 3 years after age 44 if you are at a normal weight and without risk factors for diabetes. Testing should be considered at a younger age or be carried out more frequently if you are overweight and have at least 1 risk factor for diabetes.  Colorectal cancer can be detected and often prevented. Most routine colorectal cancer screening begins at the age of 19 and continues through age 81. However,  your health care provider may recommend screening at an earlier age if you have risk factors for colon cancer. On a yearly basis, your health care provider may provide home test kits to check for hidden blood in the stool. A small camera at the end of a tube may be used to directly examine the colon (sigmoidoscopy or colonoscopy) to detect the earliest forms of colorectal cancer. Talk to your health care provider about this at age 69 when routine screening begins. A direct exam of the colon should be repeated every 5-10 years through age 29, unless early forms of precancerous polyps or small growths are found.  People who are at an increased risk for hepatitis B should be screened for this virus. You are considered at high risk for hepatitis B if:  You were born in a country where hepatitis B occurs often. Talk with your health care provider about which countries are  considered high risk.  Your parents were born in a high-risk country and you have not received a shot to protect against hepatitis B (hepatitis B vaccine).  You have HIV or AIDS.  You use needles to inject street drugs.  You live with, or have sex with, someone who has hepatitis B.  You are a man who has sex with other men (MSM).  You get hemodialysis treatment.  You take certain medicines for conditions like cancer, organ transplantation, and autoimmune conditions.  Hepatitis C blood testing is recommended for all people born from [birth year] through 1963 and any individual with known risk factors for hepatitis C.  Healthy men should no longer receive prostate-specific antigen (PSA) blood tests as part of routine cancer screening. Talk to your health care provider about prostate cancer screening.  Testicular cancer screening is not recommended for adolescents or adult males who have no symptoms. Screening includes self-exam, a health care provider exam, and other screening tests. Consult with your health care provider about any  symptoms you have or any concerns you have about testicular cancer.  Practice safe sex. Use condoms and avoid high-risk sexual practices to reduce the spread of sexually transmitted infections (STIs).  You should be screened for STIs, including gonorrhea and chlamydia if:  You are sexually active and are younger than 24 years.  You are older than 24 years, and your health care provider tells you that you are at risk for this type of infection.  Your sexual activity has changed since you were last screened, and you are at an increased risk for chlamydia or gonorrhea. Ask your health care provider if you are at risk.  If you are at risk of being infected with HIV, it is recommended that you take a prescription medicine daily to prevent HIV infection. This is called pre-exposure prophylaxis (PrEP). You are considered at risk if:  You are a man who has sex with other men (MSM).  You are a heterosexual man who is sexually active with multiple partners.  You take drugs by injection.  You are sexually active with a partner who has HIV.  Talk with your health care provider about whether you are at high risk of being infected with HIV. If you choose to begin PrEP, you should first be tested for HIV. You should then be tested every 3 months for as long as you are taking PrEP.  Use sunscreen. Apply sunscreen liberally and repeatedly throughout the day. You should seek shade when your shadow is shorter than you. Protect yourself by wearing long sleeves, pants, a wide-brimmed hat, and sunglasses year round whenever you are outdoors.  Tell your health care provider of new moles or changes in moles, especially if there is a change in shape or color. Also, tell your health care provider if a mole is larger than the size of a pencil eraser.  A one-time screening for abdominal aortic aneurysm (AAA) and surgical repair of large AAAs by ultrasound is recommended for men aged 50-75 years who are current or  former smokers.  Stay current with your vaccines (immunizations).   This information is not intended to replace advice given to you by your health care provider. Make sure you discuss any questions you have with your health care provider.   Document Released: 07/10/2007 Document Revised: 02/01/2014 Document Reviewed: 06/08/2010 Elsevier Interactive Patient Education [employer].

## 2015-01-24 ENCOUNTER — Other Ambulatory Visit (INDEPENDENT_AMBULATORY_CARE_PROVIDER_SITE_OTHER): Payer: Medicare Other

## 2015-01-24 DIAGNOSIS — E875 Hyperkalemia: Secondary | ICD-10-CM

## 2015-01-25 LAB — FECAL OCCULT BLOOD, IMMUNOCHEMICAL: Fecal Occult Bld: NEGATIVE

## 2015-01-25 LAB — POTASSIUM: Potassium: 5 mmol/L (ref 3.5–5.2)

## 2015-01-29 ENCOUNTER — Encounter: Payer: Self-pay | Admitting: Pharmacist

## 2015-02-18 ENCOUNTER — Encounter: Payer: Self-pay | Admitting: Family Medicine

## 2015-02-18 ENCOUNTER — Ambulatory Visit (INDEPENDENT_AMBULATORY_CARE_PROVIDER_SITE_OTHER): Payer: Medicare Other | Admitting: Family Medicine

## 2015-02-18 VITALS — BP 162/71 | HR 97 | Temp 97.9°F | Ht 73.0 in | Wt 255.0 lb

## 2015-02-18 DIAGNOSIS — M549 Dorsalgia, unspecified: Secondary | ICD-10-CM | POA: Insufficient documentation

## 2015-02-18 DIAGNOSIS — M546 Pain in thoracic spine: Secondary | ICD-10-CM

## 2015-02-18 MED ORDER — CYCLOBENZAPRINE HCL 10 MG PO TABS: 10.0000 mg | ORAL_TABLET | Freq: Three times a day (TID) | ORAL | Status: DC | PRN

## 2015-02-18 MED ORDER — HYDROCODONE-ACETAMINOPHEN 10-325 MG PO TABS: 1.0000 | ORAL_TABLET | Freq: Three times a day (TID) | ORAL | Status: DC | PRN

## 2015-02-18 NOTE — Progress Notes (Signed)
Subjective:    Patient ID: [name], male    DOB: [birth year], 82 y.o.   MRN: [id number]  HPI 82 year old gentleman with back pain. There is a history of back spasms that occurs from time to time but his history is complicated by prostate cancer with radiation therapy. His PSA has been going up and now the urologist has placed him on a testosterone inhibitor. One of the side effects is back pain. Pain seems to localize on the right side in the thoracic area. It is not midline over the spine or ribs necessarily.  Patient Active Problem List   Diagnosis Date Noted  . BMI 33.0-33.9,adult 12/27/2014  . Hypertension 07/04/2012  . Hyperlipidemia 07/04/2012  . Hypothyroidism 07/04/2012  . Bladder neck contracture 06/22/2012  . Radiation proctitis 08/11/2010  . Anal stenosis 08/11/2010  . Personal history of colonic polyps 08/11/2010   Outpatient Encounter Prescriptions as of 02/18/2015  Medication Sig  . amLODipine (NORVASC) 5 MG tablet Take 1 tablet (5 mg total) by mouth every morning.  . Calcium Carb-Cholecalciferol (CALCIUM 600 + D PO) Take 1 tablet by mouth daily after lunch.   . Cholecalciferol (VITAMIN D3) 5000 UNITS CAPS Take 1 capsule by mouth daily.  . Coenzyme Q10-Red Yeast Rice 60-600 MG CAPS Take 2 capsules by mouth daily. THIS PILL ALSO HAS '100MG'$  NIACIN  . diphenhydrAMINE (BENADRYL) 25 MG tablet Take 25 mg by mouth every 6 (six) hours as needed for itching.  . enzalutamide (XTANDI) 40 MG capsule Take 160 mg by mouth daily.  Marland Kitchen FLAXSEED, LINSEED, PO Take 30 mLs by mouth every morning. GRINDS FLAX SEEDS AND PUTS IN 1/4 CUP OF WATER  . ibuprofen (ADVIL,MOTRIN) 200 MG tablet Take 400 mg by mouth every 6 (six) hours as needed for moderate pain.  . Incontinence Supply Disposable (INCONTINENCE BRIEF LARGE) MISC 1 each by Does not apply route 5 (five) times daily.  Marland Kitchen Leuprolide Acetate, 6 Month, (LUPRON DEPOT) 45 MG injection Inject 45 mg into the muscle every 6 (six) months.   .  levothyroxine (SYNTHROID, LEVOTHROID) 50 MCG tablet TAKE 1 TABLET (50 MCG TOTAL) BY MOUTH DAILY BEFORE BREAKFAST.  . Magnesium 300 MG CAPS Take 1 capsule by mouth daily.  . Probiotic Product (PROBIOTIC DAILY PO) Take 1 capsule by mouth daily.   No facility-administered encounter medications on file as of 02/18/2015.      Review of Systems  Constitutional: Negative.   Respiratory: Negative.   Cardiovascular: Negative.   Musculoskeletal: Positive for back pain.  Psychiatric/Behavioral: Negative.        Objective:   Physical Exam  Constitutional: He is oriented to person, place, and time. He appears well-developed and well-nourished.  Musculoskeletal:  Patient has an over-the-counter tens unit in place and this is helpful. He has normal range of motion as far as flexion and lateral bending. There is no tenderness to percussion over the thoracic spine. The lumbar spine is negative.  Neurological: He is alert and oriented to person, place, and time.          Assessment & Plan:  1. Right-sided thoracic back pain Pain seems muscular in origin. I think he may continue using the TENS unit. I will add a muscle relaxant. I have asked him to keep moving to keep his back from stiffening up. If the muscle relaxant is not effective as far as pain relief I have given him hydrocodone 10 mg. There was some question of allergy but upon review of his medical  history he has taken that medicine in the past without problems. If needed therapy is effective he may have to consult with his urologist about continuation of current hormonal therapy  Wardell Honour MD  .Claudean Kinds

## 2015-03-26 DIAGNOSIS — M1711 Unilateral primary osteoarthritis, right knee: Secondary | ICD-10-CM | POA: Diagnosis not present

## 2015-04-22 DIAGNOSIS — D2371 Other benign neoplasm of skin of right lower limb, including hip: Secondary | ICD-10-CM | POA: Diagnosis not present

## 2015-04-22 DIAGNOSIS — M79672 Pain in left foot: Secondary | ICD-10-CM | POA: Diagnosis not present

## 2015-04-22 DIAGNOSIS — D2372 Other benign neoplasm of skin of left lower limb, including hip: Secondary | ICD-10-CM | POA: Diagnosis not present

## 2015-04-22 DIAGNOSIS — M79671 Pain in right foot: Secondary | ICD-10-CM | POA: Diagnosis not present

## 2015-05-29 ENCOUNTER — Encounter: Payer: Self-pay | Admitting: *Deleted

## 2015-06-18 DIAGNOSIS — M1711 Unilateral primary osteoarthritis, right knee: Secondary | ICD-10-CM | POA: Diagnosis not present

## 2015-06-18 DIAGNOSIS — C61 Malignant neoplasm of prostate: Secondary | ICD-10-CM | POA: Diagnosis not present

## 2015-06-26 DIAGNOSIS — E291 Testicular hypofunction: Secondary | ICD-10-CM | POA: Diagnosis not present

## 2015-06-26 DIAGNOSIS — C61 Malignant neoplasm of prostate: Secondary | ICD-10-CM | POA: Diagnosis not present

## 2015-06-27 ENCOUNTER — Ambulatory Visit (INDEPENDENT_AMBULATORY_CARE_PROVIDER_SITE_OTHER): Payer: Medicare Other

## 2015-06-27 ENCOUNTER — Ambulatory Visit (INDEPENDENT_AMBULATORY_CARE_PROVIDER_SITE_OTHER): Payer: Medicare Other | Admitting: Nurse Practitioner

## 2015-06-27 ENCOUNTER — Encounter: Payer: Self-pay | Admitting: Nurse Practitioner

## 2015-06-27 VITALS — BP 158/66 | HR 48 | Temp 97.0°F | Ht 73.0 in | Wt 262.0 lb

## 2015-06-27 DIAGNOSIS — E039 Hypothyroidism, unspecified: Secondary | ICD-10-CM

## 2015-06-27 DIAGNOSIS — I10 Essential (primary) hypertension: Secondary | ICD-10-CM | POA: Diagnosis not present

## 2015-06-27 DIAGNOSIS — Z8546 Personal history of malignant neoplasm of prostate: Secondary | ICD-10-CM

## 2015-06-27 DIAGNOSIS — Z6833 Body mass index (BMI) 33.0-33.9, adult: Secondary | ICD-10-CM | POA: Diagnosis not present

## 2015-06-27 DIAGNOSIS — E785 Hyperlipidemia, unspecified: Secondary | ICD-10-CM | POA: Diagnosis not present

## 2015-06-27 MED ORDER — LEVOTHYROXINE SODIUM 50 MCG PO TABS: ORAL_TABLET | ORAL | Status: DC

## 2015-06-27 MED ORDER — AMLODIPINE BESYLATE 5 MG PO TABS: 5.0000 mg | ORAL_TABLET | Freq: Every morning | ORAL | Status: DC

## 2015-06-27 NOTE — Progress Notes (Signed)
Subjective:    Patient ID: [name], male    DOB: [birth year], 82 y.o.   MRN: [id number]  Patient here today for follow up of chronic medical problems.  Outpatient Encounter Prescriptions as of 06/27/2015  Medication Sig  . amLODipine (NORVASC) 5 MG tablet Take 1 tablet (5 mg total) by mouth every morning.  . Calcium Carb-Cholecalciferol (CALCIUM 600 + D PO) Take 1 tablet by mouth daily after lunch.   . Cholecalciferol (VITAMIN D3) 5000 UNITS CAPS Take 1 capsule by mouth daily.  . Coenzyme Q10-Red Yeast Rice 60-600 MG CAPS Take 2 capsules by mouth daily. THIS PILL ALSO HAS 100MG NIACIN  . diphenhydrAMINE (BENADRYL) 25 MG tablet Take 25 mg by mouth every 6 (six) hours as needed for itching.  . enzalutamide (XTANDI) 40 MG capsule Take 160 mg by mouth daily.  Marland Kitchen FLAXSEED, LINSEED, PO Take 30 mLs by mouth every morning. GRINDS FLAX SEEDS AND PUTS IN 1/4 CUP OF WATER  . ibuprofen (ADVIL,MOTRIN) 200 MG tablet Take 400 mg by mouth every 6 (six) hours as needed for moderate pain.  Marland Kitchen Leuprolide Acetate, 6 Month, (LUPRON DEPOT) 45 MG injection Inject 45 mg into the muscle every 6 (six) months.   . levothyroxine (SYNTHROID, LEVOTHROID) 50 MCG tablet TAKE 1 TABLET (50 MCG TOTAL) BY MOUTH DAILY BEFORE BREAKFAST.  . Magnesium 300 MG CAPS Take 1 capsule by mouth daily.  . Probiotic Product (PROBIOTIC DAILY PO) Take 1 capsule by mouth daily.  . [DISCONTINUED] cyclobenzaprine (FLEXERIL) 10 MG tablet Take 1 tablet (10 mg total) by mouth 3 (three) times daily as needed for muscle spasms.  . [DISCONTINUED] HYDROcodone-acetaminophen (NORCO) 10-325 MG tablet Take 1 tablet by mouth every 8 (eight) hours as needed.  . [DISCONTINUED] Incontinence Supply Disposable (INCONTINENCE BRIEF LARGE) MISC 1 each by Does not apply route 5 (five) times daily.   No facility-administered encounter medications on file as of 06/27/2015.     Hypertension This is a chronic problem. The current episode started more than 1 year  ago. The problem is controlled. Pertinent negatives include no chest pain, headaches, palpitations or shortness of breath. Risk factors for coronary artery disease include dyslipidemia, obesity and sedentary lifestyle. Past treatments include ACE inhibitors and calcium channel blockers. The current treatment provides moderate improvement. Compliance problems include diet and exercise.  Hypertensive end-organ damage includes a thyroid problem. There is no history of CAD/MI or CVA.  Hyperlipidemia This is a chronic problem. The current episode started more than 1 year ago. Recent lipid tests were reviewed and are variable. Exacerbating diseases include obesity. He has no history of diabetes or hypothyroidism. Pertinent negatives include no chest pain or shortness of breath. Current antihyperlipidemic treatment includes diet change. The current treatment provides moderate improvement of lipids. Compliance problems include adherence to diet and adherence to exercise.  Risk factors for coronary artery disease include dyslipidemia, hypertension and post-menopausal.  Thyroid Problem Symptoms include fatigue (occassionally). Patient reports no heat intolerance, palpitations or visual change. His past medical history is significant for hyperlipidemia. There is no history of diabetes.  Hx prostate cancer Had prostate cancer in 25 years ago and had a prostatectomy- was put on lupron injections. PSA started increasing again in January so Urologist put him on [place] daily. Patient last PSA was checked on 06/19/15 had gone from 5.30 to 0.05. Med has lots of side effects but seems to be working.   * Patient was seen on 02/18/15 with right sided thoracic back pain. Was told  to continue with TENS unit and was given hydrocodone.    Review of Systems  Constitutional: Positive for fatigue (occassionally).  Eyes: Negative.   Respiratory: Negative for shortness of breath.   Cardiovascular: Negative for chest pain and  palpitations.  Endocrine: Negative for heat intolerance.  Genitourinary: Negative.   Neurological: Negative for headaches.  Psychiatric/Behavioral: Negative.   All other systems reviewed and are negative.      Objective:   Physical Exam  Constitutional: He is oriented to person, place, and time. He appears well-developed and well-nourished.  HENT:  Head: Normocephalic.  Right Ear: External ear normal.  Left Ear: External ear normal.  Nose: Nose normal.  Mouth/Throat: Oropharynx is clear and moist.  Eyes: EOM are normal. Pupils are equal, round, and reactive to light.  Neck: Normal range of motion. Neck supple. No thyromegaly present.  Cardiovascular: Normal rate, regular rhythm, normal heart sounds and intact distal pulses.   No murmur heard. Pulmonary/Chest: Effort normal and breath sounds normal. He has no wheezes. He has no rales.  Abdominal: Soft. Bowel sounds are normal.  Musculoskeletal: Normal range of motion.  Neurological: He is alert and oriented to person, place, and time.  Skin: Skin is warm and dry.  Psychiatric: He has a normal mood and affect. His behavior is normal. Judgment and thought content normal.    BP 158/66 mmHg  Pulse 48  Temp(Src) 97 F (36.1 C) (Oral)  Ht '6\' 1"'  (1.854 m)  Wt 262 lb (118.842 kg)  BMI 34.57 kg/m2  Chest x ray- no cardiopulmonary abnormalities-Preliminary reading by Ronnald Collum, FNP  [facility]   EKG- NSR- Mary-Margaret Hassell Done, FNP      Assessment & Plan:   1. Essential hypertension Do not add salt to diet - EKG 12-Lead - CMP14+EGFR - amLODipine (NORVASC) 5 MG tablet; Take 1 tablet (5 mg total) by mouth every morning.  Dispense: 90 tablet; Refill: 1  2. Hypothyroidism, unspecified hypothyroidism type - Thyroid Panel With TSH - levothyroxine (SYNTHROID, LEVOTHROID) 50 MCG tablet; TAKE 1 TABLET (50 MCG TOTAL) BY MOUTH DAILY BEFORE BREAKFAST.  Dispense: 90 tablet; Refill: 1  3. Hyperlipidemia Low fat diet - Lipid panel  4.  BMI 33.0-33.9,adult Discussed diet and exercise for person with BMI >25 Will recheck weight in 3-6 months  5. H/O prostate cancer - DG Chest 2 View; Future    Labs pending Health maintenance reviewed Diet and exercise encouraged Continue all meds Follow up  In 3 months   [place], FNP

## 2015-06-27 NOTE — Patient Instructions (Signed)
Fall Prevention in the Home  Falls can cause injuries and can affect people from all age groups. There are many simple things that you can do to make your home safe and to help prevent falls. WHAT CAN I DO ON THE OUTSIDE OF MY HOME?  Regularly repair the edges of walkways and driveways and fix any cracks.  Remove high doorway thresholds.  Trim any shrubbery on the main path into your home.  Use bright outdoor lighting.  Clear walkways of debris and clutter, including tools and rocks.  Regularly check that handrails are securely fastened and in good repair. Both sides of any steps should have handrails.  Install guardrails along the edges of any raised decks or porches.  Have leaves, snow, and ice cleared regularly.  Use sand or salt on walkways during winter months.  In the garage, clean up any spills right away, including grease or oil spills. WHAT CAN I DO IN THE BATHROOM?  Use night lights.  Install grab bars by the toilet and in the tub and shower. Do not use towel bars as grab bars.  Use non-skid mats or decals on the floor of the tub or shower.  If you need to sit down while you are in the shower, use a plastic, non-slip stool..  Keep the floor dry. Immediately clean up any water that spills on the floor.  Remove soap buildup in the tub or shower on a regular basis.  Attach bath mats securely with double-sided non-slip rug tape.  Remove throw rugs and other tripping hazards from the floor. WHAT CAN I DO IN THE BEDROOM?  Use night lights.  Make sure that a bedside light is easy to reach.  Do not use oversized bedding that drapes onto the floor.  Have a firm chair that has side arms to use for getting dressed.  Remove throw rugs and other tripping hazards from the floor. WHAT CAN I DO IN THE KITCHEN?   Clean up any spills right away.  Avoid walking on wet floors.  Place frequently used items in easy-to-reach places.  If you need to reach for something  above you, use a sturdy step stool that has a grab bar.  Keep electrical cables out of the way.  Do not use floor polish or wax that makes floors slippery. If you have to use wax, make sure that it is non-skid floor wax.  Remove throw rugs and other tripping hazards from the floor. WHAT CAN I DO IN THE STAIRWAYS?  Do not leave any items on the stairs.  Make sure that there are handrails on both sides of the stairs. Fix handrails that are broken or loose. Make sure that handrails are as long as the stairways.  Check any carpeting to make sure that it is firmly attached to the stairs. Fix any carpet that is loose or worn.  Avoid having throw rugs at the top or bottom of stairways, or secure the rugs with carpet tape to prevent them from moving.  Make sure that you have a light switch at the top of the stairs and the bottom of the stairs. If you do not have them, have them installed. WHAT ARE SOME OTHER FALL PREVENTION TIPS?  Wear closed-toe shoes that fit well and support your feet. Wear shoes that have rubber soles or low heels.  When you use a stepladder, make sure that it is completely opened and that the sides are firmly locked. Have someone hold the ladder while you   are using it. Do not climb a closed stepladder.  Add color or contrast paint or tape to grab bars and handrails in your home. Place contrasting color strips on the first and last steps.  Use mobility aids as needed, such as canes, walkers, scooters, and crutches.  Turn on lights if it is dark. Replace any light bulbs that burn out.  Set up furniture so that there are clear paths. Keep the furniture in the same spot.  Fix any uneven floor surfaces.  Choose a carpet design that does not hide the edge of steps of a stairway.  Be aware of any and all pets.  Review your medicines with your healthcare provider. Some medicines can cause dizziness or changes in blood pressure, which increase your risk of falling. Talk  with your health care provider about other ways that you can decrease your risk of falls. This may include working with a physical therapist or trainer to improve your strength, balance, and endurance.   This information is not intended to replace advice given to you by your health care provider. Make sure you discuss any questions you have with your health care provider.   Document Released: 01/01/2002 Document Revised: 05/28/2014 Document Reviewed: 02/15/2014 Elsevier Interactive Patient Education 2016 Elsevier Inc.  

## 2015-06-28 LAB — CMP14+EGFR
ALBUMIN: 4.1 g/dL (ref 3.5–4.7)
ALK PHOS: 64 IU/L (ref 39–117)
ALT: 10 IU/L (ref 0–44)
AST: 15 IU/L (ref 0–40)
Albumin/Globulin Ratio: 1.6 (ref 1.2–2.2)
BUN / CREAT RATIO: 22 (ref 10–24)
BUN: 19 mg/dL (ref 8–27)
Bilirubin Total: 0.3 mg/dL (ref 0.0–1.2)
CALCIUM: 9.4 mg/dL (ref 8.6–10.2)
CO2: 21 mmol/L (ref 18–29)
CREATININE: 0.87 mg/dL (ref 0.76–1.27)
Chloride: 100 mmol/L (ref 96–106)
GFR calc Af Amer: 94 mL/min/{1.73_m2} (ref >59)
GFR, EST NON AFRICAN AMERICAN: 82 mL/min/{1.73_m2} (ref >59)
GLOBULIN, TOTAL: 2.5 g/dL (ref 1.5–4.5)
GLUCOSE: 71 mg/dL (ref 65–99)
Potassium: 4.9 mmol/L (ref 3.5–5.2)
SODIUM: 140 mmol/L (ref 134–144)
Total Protein: 6.6 g/dL (ref 6.0–8.5)

## 2015-06-28 LAB — THYROID PANEL WITH TSH
FREE THYROXINE INDEX: 2 (ref 1.2–4.9)
T3 Uptake Ratio: 31 % (ref 24–39)
T4, Total: 6.5 ug/dL (ref 4.5–12.0)
TSH: 4.73 u[IU]/mL — ABNORMAL HIGH (ref 0.450–4.500)

## 2015-06-28 LAB — LIPID PANEL
CHOLESTEROL TOTAL: 167 mg/dL (ref 100–199)
Chol/HDL Ratio: 3.4 ratio units (ref 0.0–5.0)
HDL: 49 mg/dL (ref >39)
LDL Calculated: 87 mg/dL (ref 0–99)
TRIGLYCERIDES: 155 mg/dL — AB (ref 0–149)
VLDL Cholesterol Cal: 31 mg/dL (ref 5–40)

## 2015-06-30 ENCOUNTER — Encounter: Payer: Self-pay | Admitting: *Deleted

## 2015-07-02 DIAGNOSIS — M1711 Unilateral primary osteoarthritis, right knee: Secondary | ICD-10-CM | POA: Diagnosis not present

## 2015-07-10 DIAGNOSIS — S83231D Complex tear of medial meniscus, current injury, right knee, subsequent encounter: Secondary | ICD-10-CM | POA: Diagnosis not present

## 2015-07-10 DIAGNOSIS — M1711 Unilateral primary osteoarthritis, right knee: Secondary | ICD-10-CM | POA: Diagnosis not present

## 2015-07-17 ENCOUNTER — Telehealth: Payer: Self-pay | Admitting: Family Medicine

## 2015-07-17 NOTE — Telephone Encounter (Signed)
appt made for Fri 6/23 for surgical clearance

## 2015-07-18 ENCOUNTER — Encounter: Payer: Self-pay | Admitting: Family Medicine

## 2015-07-18 ENCOUNTER — Ambulatory Visit (INDEPENDENT_AMBULATORY_CARE_PROVIDER_SITE_OTHER): Payer: Medicare Other | Admitting: Family Medicine

## 2015-07-18 VITALS — BP 186/72 | HR 71 | Temp 97.9°F | Ht 72.0 in | Wt 261.8 lb

## 2015-07-18 DIAGNOSIS — Z8546 Personal history of malignant neoplasm of prostate: Secondary | ICD-10-CM | POA: Diagnosis not present

## 2015-07-18 DIAGNOSIS — I10 Essential (primary) hypertension: Secondary | ICD-10-CM | POA: Diagnosis not present

## 2015-07-18 DIAGNOSIS — E039 Hypothyroidism, unspecified: Secondary | ICD-10-CM | POA: Diagnosis not present

## 2015-07-18 NOTE — Progress Notes (Signed)
Subjective:    Patient ID: [name], male    DOB: 05-Aug-1932, 82 y.o.   MRN: [id number]  HPI  Patient is here today for surgical clearance He is to be scheduled for a right knee arthroscopic procedure for torn meniscus. He does not recall a specific injury at the knee has been swelling and hurting and MRI shows the moment meniscus He has had prostate cancer but no real history of heart pulmonary disease. He had lab work done earlier this month and it always basically normal. EKG and chest x-ray were also done. I think these studies are normal although EKG was read by the machine as old inferior MI. I think it looks okay. injury        Patient Active Problem List   Diagnosis Date Noted  . H/O prostate cancer 06/27/2015  . Back pain 02/18/2015  . BMI 33.0-33.9,adult 12/27/2014  . Hypertension 07/04/2012  . Hyperlipidemia 07/04/2012  . Hypothyroidism 07/04/2012  . Bladder neck contracture 06/22/2012  . Radiation proctitis 08/11/2010  . Anal stenosis 08/11/2010  . Personal history of colonic polyps 08/11/2010   Outpatient Encounter Prescriptions as of 07/18/2015  Medication Sig  . amLODipine (NORVASC) 5 MG tablet Take 1 tablet (5 mg total) by mouth every morning.  . Calcium Carb-Cholecalciferol (CALCIUM 600 + D PO) Take 1 tablet by mouth daily after lunch.   . Cholecalciferol (VITAMIN D3) 5000 UNITS CAPS Take 1 capsule by mouth daily.  . Coenzyme Q10-Red Yeast Rice 60-600 MG CAPS Take 2 capsules by mouth daily. THIS PILL ALSO HAS '100MG'$  NIACIN  . diphenhydrAMINE (BENADRYL) 25 MG tablet Take 25 mg by mouth every 6 (six) hours as needed for itching.  . enzalutamide (XTANDI) 40 MG capsule Take 160 mg by mouth daily.  Marland Kitchen FLAXSEED, LINSEED, PO Take 30 mLs by mouth every morning. GRINDS FLAX SEEDS AND PUTS IN 1/4 CUP OF WATER  . ibuprofen (ADVIL,MOTRIN) 200 MG tablet Take 400 mg by mouth every 6 (six) hours as needed for moderate pain.  Marland Kitchen Leuprolide Acetate, 6 Month, (LUPRON DEPOT) 45  MG injection Inject 45 mg into the muscle every 6 (six) months.   . levothyroxine (SYNTHROID, LEVOTHROID) 50 MCG tablet TAKE 1 TABLET (50 MCG TOTAL) BY MOUTH DAILY BEFORE BREAKFAST.  . Magnesium 300 MG CAPS Take 1 capsule by mouth daily.  . Probiotic Product (PROBIOTIC DAILY PO) Take 1 capsule by mouth daily.   No facility-administered encounter medications on file as of 07/18/2015.     Review of Systems  Constitutional: Negative.   HENT: Negative.   Eyes: Negative.   Respiratory: Negative.   Cardiovascular: Negative.   Gastrointestinal: Negative.   Endocrine: Negative.   Genitourinary: Negative.   Musculoskeletal: Negative.   Skin: Negative.   Allergic/Immunologic: Negative.   Neurological: Negative.   Hematological: Negative.   Psychiatric/Behavioral: Negative.        Objective:   Physical Exam  Constitutional: He is oriented to person, place, and time. He appears well-developed and well-nourished.  HENT:  Head: Normocephalic.  Cardiovascular: Normal rate, regular rhythm, normal heart sounds and intact distal pulses.   Pulmonary/Chest: Effort normal and breath sounds normal.  Abdominal: Soft.  Musculoskeletal: Normal range of motion.  Neurological: He is alert and oriented to person, place, and time.  Psychiatric: He has a normal mood and affect. His behavior is normal.    BP 186/72 mmHg  Pulse 71  Temp(Src) 97.9 F (36.6 C) (Oral)  Ht 6' (1.829 m)  Wt 261  lb 12.8 oz (118.752 kg)  BMI 35.50 kg/m2         Assessment & Plan:  1. Essential hypertension Blood pressure is elevated today at 186/72. He takes amlodipine.  2. H/O prostate cancer He had prostate surgery but PSA went up and he was started on a new her biologic agent with subsequent fall 20 levels  3. Hypothyroidism, unspecified hypothyroidism type Last TSH was slightly elevated but does not warrant change in dosage of thyroid replacement  This patient should be cleared for arthroscopic knee  surgery  Wardell Honour MD

## 2015-07-22 DIAGNOSIS — M79672 Pain in left foot: Secondary | ICD-10-CM | POA: Diagnosis not present

## 2015-07-22 DIAGNOSIS — D2372 Other benign neoplasm of skin of left lower limb, including hip: Secondary | ICD-10-CM | POA: Diagnosis not present

## 2015-07-22 DIAGNOSIS — M79671 Pain in right foot: Secondary | ICD-10-CM | POA: Diagnosis not present

## 2015-07-22 DIAGNOSIS — D2371 Other benign neoplasm of skin of right lower limb, including hip: Secondary | ICD-10-CM | POA: Diagnosis not present

## 2015-07-30 DIAGNOSIS — M1A9XX0 Chronic gout, unspecified, without tophus (tophi): Secondary | ICD-10-CM | POA: Diagnosis not present

## 2015-07-30 DIAGNOSIS — M23321 Other meniscus derangements, posterior horn of medial meniscus, right knee: Secondary | ICD-10-CM | POA: Diagnosis not present

## 2015-07-30 DIAGNOSIS — G8918 Other acute postprocedural pain: Secondary | ICD-10-CM | POA: Diagnosis not present

## 2015-07-30 DIAGNOSIS — M2241 Chondromalacia patellae, right knee: Secondary | ICD-10-CM | POA: Diagnosis not present

## 2015-07-30 DIAGNOSIS — M23351 Other meniscus derangements, posterior horn of lateral meniscus, right knee: Secondary | ICD-10-CM | POA: Diagnosis not present

## 2015-08-13 DIAGNOSIS — S83203S Other tear of unspecified meniscus, current injury, right knee, sequela: Secondary | ICD-10-CM | POA: Diagnosis not present

## 2015-09-09 DIAGNOSIS — Z4789 Encounter for other orthopedic aftercare: Secondary | ICD-10-CM | POA: Diagnosis not present

## 2015-10-14 DIAGNOSIS — D2371 Other benign neoplasm of skin of right lower limb, including hip: Secondary | ICD-10-CM | POA: Diagnosis not present

## 2015-10-14 DIAGNOSIS — M79671 Pain in right foot: Secondary | ICD-10-CM | POA: Diagnosis not present

## 2015-10-23 ENCOUNTER — Encounter: Payer: Self-pay | Admitting: Nurse Practitioner

## 2015-10-23 ENCOUNTER — Ambulatory Visit (INDEPENDENT_AMBULATORY_CARE_PROVIDER_SITE_OTHER): Payer: Medicare Other | Admitting: Nurse Practitioner

## 2015-10-23 VITALS — BP 152/84 | HR 66 | Temp 97.3°F | Ht 72.0 in | Wt 264.0 lb

## 2015-10-23 DIAGNOSIS — H8303 Labyrinthitis, bilateral: Secondary | ICD-10-CM

## 2015-10-23 MED ORDER — MECLIZINE HCL 25 MG PO TABS: 25.0000 mg | ORAL_TABLET | Freq: Three times a day (TID) | ORAL | 0 refills | Status: DC | PRN

## 2015-10-23 NOTE — Patient Instructions (Signed)
Labyrinthitis Labyrinthitis is an infection of the inner ear. Your inner ear is a system of tubes and canals (labyrinth). These are filled with fluid. Nerve cells in your inner ear send signals for hearing and balance to your brain. When tiny germs get inside the tubes and canals, they harm the cells that send messages to the brain. This can cause changes in hearing and balance. HOME CARE INSTRUCTIONS  Take medicines only as told by your doctor.  If you were prescribed an antibiotic medicine, finish all of it even if you start to feel better.  Rest as much as possible.  Avoid loud noises and bright lights.  Do not make sudden movements until any dizziness goes away.  Do not drive until your doctor says that you can.  Drink enough fluid to keep your pee (urine) clear or pale yellow.  Work with a physical therapist if you still feel dizzy after several weeks. A therapist can teach you exercises to help you deal with your dizziness.  Keep all follow-up visits as told by your doctor. This is important. GET HELP IF:  Your symptoms do not get better with medicines.  You do not get better after two weeks.  You have a fever. GET HELP RIGHT AWAY IF:  You are very dizzy.  You keep throwing up (vomiting) or keep feeling sick to your stomach (nauseous).  Your hearing gets a lot worse very quickly.   This information is not intended to replace advice given to you by your health care provider. Make sure you discuss any questions you have with your health care provider.   Document Released: 01/11/2005 Document Revised: 02/01/2014 Document Reviewed: 10/23/2013 Elsevier Interactive Patient Education [employer].

## 2015-10-23 NOTE — Progress Notes (Signed)
   Subjective:    Patient ID: [name], male    DOB: 1932/07/02, 82 y.o.   MRN: [id number]  HPI Patient in today c/o of feeling dizzy. Started about 1 week ago- says that he gets dizzy when he stands up, rolling over in bed can cause. He has been using cane because he does not want to fall.   Review of Systems  Constitutional: Negative for fatigue and fever.  HENT: Negative.   Respiratory: Negative.   Cardiovascular: Negative.   Gastrointestinal: Negative.   Genitourinary: Negative.   Neurological: Positive for dizziness.  Psychiatric/Behavioral: Negative.   All other systems reviewed and are negative.      Objective:   Physical Exam  Constitutional: He is oriented to person, place, and time. He appears well-developed and well-nourished. No distress.  Cardiovascular: Normal rate, regular rhythm and normal heart sounds.   Pulmonary/Chest: Effort normal.  Abdominal: Soft. Bowel sounds are normal.  Neurological: He is alert and oriented to person, place, and time. He has normal reflexes. No cranial nerve deficit.  Skin: Skin is warm.  Psychiatric: He has a normal mood and affect. His behavior is normal. Judgment and thought content normal.   BP (!) 152/84 (Cuff Size: Large)   Pulse 66   Temp 97.3 F (36.3 C) (Oral)   Ht 6' (1.829 m)   Wt 264 lb (119.7 kg)   SpO2 95%   BMI 35.80 kg/m         Assessment & Plan:   1. Labyrinthitis, bilateral    Meds ordered this encounter  Medications  . meclizine (ANTIVERT) 25 MG tablet    Sig: Take 1 tablet (25 mg total) by mouth 3 (three) times daily as needed for dizziness.    Dispense:  30 tablet    Refill:  0    Order Specific Question:   Supervising Provider    Answer:   Eustaquio Maize [[id number]]   Force fluids Rest falll precautions RTO prn  Rodney Hassell Done, FNP

## 2015-11-10 ENCOUNTER — Ambulatory Visit (INDEPENDENT_AMBULATORY_CARE_PROVIDER_SITE_OTHER): Payer: Medicare Other

## 2015-11-10 DIAGNOSIS — Z23 Encounter for immunization: Secondary | ICD-10-CM

## 2015-12-15 DIAGNOSIS — H4311 Vitreous hemorrhage, right eye: Secondary | ICD-10-CM | POA: Diagnosis not present

## 2015-12-16 ENCOUNTER — Telehealth: Payer: Self-pay | Admitting: Family Medicine

## 2015-12-16 ENCOUNTER — Encounter (INDEPENDENT_AMBULATORY_CARE_PROVIDER_SITE_OTHER): Payer: Medicare Other | Admitting: Ophthalmology

## 2015-12-16 DIAGNOSIS — H4311 Vitreous hemorrhage, right eye: Secondary | ICD-10-CM

## 2015-12-16 DIAGNOSIS — H43813 Vitreous degeneration, bilateral: Secondary | ICD-10-CM | POA: Diagnosis not present

## 2015-12-16 DIAGNOSIS — H35033 Hypertensive retinopathy, bilateral: Secondary | ICD-10-CM

## 2015-12-16 DIAGNOSIS — I10 Essential (primary) hypertension: Secondary | ICD-10-CM

## 2015-12-16 NOTE — Telephone Encounter (Signed)
Pt is only taking the baby ASA and advised of provider feedback and pt voiced understanding.

## 2015-12-16 NOTE — Telephone Encounter (Signed)
If he is on a regular asa then drop to baby ASA- if on baby asa can hold I guess- just know increase risk of stroke if not on baby asa at least

## 2015-12-16 NOTE — Telephone Encounter (Signed)
Patient saw his eye doctor this morning who told him they saw where he must have had some bleeding in his eye because there are remnants of blood there, no active bleeding now.  Eye doctor wanted him to talk with you and find out if it was okay for him to stop his aspirin for one month.  Patient has not noticed any symptoms of bleeding, no blood in stool either.  Please advise.

## 2015-12-22 DIAGNOSIS — C61 Malignant neoplasm of prostate: Secondary | ICD-10-CM | POA: Diagnosis not present

## 2015-12-22 LAB — PSA: PSA: 0.015

## 2015-12-29 ENCOUNTER — Encounter: Payer: Self-pay | Admitting: Nurse Practitioner

## 2015-12-29 ENCOUNTER — Ambulatory Visit (INDEPENDENT_AMBULATORY_CARE_PROVIDER_SITE_OTHER): Payer: Medicare Other | Admitting: Nurse Practitioner

## 2015-12-29 VITALS — BP 144/84 | HR 68 | Temp 96.4°F | Ht 72.0 in | Wt 262.0 lb

## 2015-12-29 DIAGNOSIS — E039 Hypothyroidism, unspecified: Secondary | ICD-10-CM

## 2015-12-29 DIAGNOSIS — E782 Mixed hyperlipidemia: Secondary | ICD-10-CM

## 2015-12-29 DIAGNOSIS — Z8546 Personal history of malignant neoplasm of prostate: Secondary | ICD-10-CM | POA: Diagnosis not present

## 2015-12-29 DIAGNOSIS — E875 Hyperkalemia: Secondary | ICD-10-CM

## 2015-12-29 DIAGNOSIS — Z6833 Body mass index (BMI) 33.0-33.9, adult: Secondary | ICD-10-CM

## 2015-12-29 DIAGNOSIS — I10 Essential (primary) hypertension: Secondary | ICD-10-CM

## 2015-12-29 MED ORDER — AMLODIPINE BESYLATE 10 MG PO TABS: 10.0000 mg | ORAL_TABLET | Freq: Every day | ORAL | 1 refills | Status: DC

## 2015-12-29 MED ORDER — LEVOTHYROXINE SODIUM 50 MCG PO TABS: ORAL_TABLET | ORAL | 1 refills | Status: DC

## 2015-12-29 NOTE — Patient Instructions (Signed)
Fall Prevention in the Home Falls can cause injuries and can affect people from all age groups. There are many simple things that you can do to make your home safe and to help prevent falls. What can I do on the outside of my home?  Regularly repair the edges of walkways and driveways and fix any cracks.  Remove high doorway thresholds.  Trim any shrubbery on the main path into your home.  Use bright outdoor lighting.  Clear walkways of debris and clutter, including tools and rocks.  Regularly check that handrails are securely fastened and in good repair. Both sides of any steps should have handrails.  Install guardrails along the edges of any raised decks or porches.  Have leaves, snow, and ice cleared regularly.  Use sand or salt on walkways during winter months.  In the garage, clean up any spills right away, including grease or oil spills. What can I do in the bathroom?  Use night lights.  Install grab bars by the toilet and in the tub and shower. Do not use towel bars as grab bars.  Use non-skid mats or decals on the floor of the tub or shower.  If you need to sit down while you are in the shower, use a plastic, non-slip stool.  Keep the floor dry. Immediately clean up any water that spills on the floor.  Remove soap buildup in the tub or shower on a regular basis.  Attach bath mats securely with double-sided non-slip rug tape.  Remove throw rugs and other tripping hazards from the floor. What can I do in the bedroom?  Use night lights.  Make sure that a bedside light is easy to reach.  Do not use oversized bedding that drapes onto the floor.  Have a firm chair that has side arms to use for getting dressed.  Remove throw rugs and other tripping hazards from the floor. What can I do in the kitchen?  Clean up any spills right away.  Avoid walking on wet floors.  Place frequently used items in easy-to-reach places.  If you need to reach for something above  you, use a sturdy step stool that has a grab bar.  Keep electrical cables out of the way.  Do not use floor polish or wax that makes floors slippery. If you have to use wax, make sure that it is non-skid floor wax.  Remove throw rugs and other tripping hazards from the floor. What can I do in the stairways?  Do not leave any items on the stairs.  Make sure that there are handrails on both sides of the stairs. Fix handrails that are broken or loose. Make sure that handrails are as long as the stairways.  Check any carpeting to make sure that it is firmly attached to the stairs. Fix any carpet that is loose or worn.  Avoid having throw rugs at the top or bottom of stairways, or secure the rugs with carpet tape to prevent them from moving.  Make sure that you have a light switch at the top of the stairs and the bottom of the stairs. If you do not have them, have them installed. What are some other fall prevention tips?  Wear closed-toe shoes that fit well and support your feet. Wear shoes that have rubber soles or low heels.  When you use a stepladder, make sure that it is completely opened and that the sides are firmly locked. Have someone hold the ladder while you are using   it. Do not climb a closed stepladder.  Add color or contrast paint or tape to grab bars and handrails in your home. Place contrasting color strips on the first and last steps.  Use mobility aids as needed, such as canes, walkers, scooters, and crutches.  Turn on lights if it is dark. Replace any light bulbs that burn out.  Set up furniture so that there are clear paths. Keep the furniture in the same spot.  Fix any uneven floor surfaces.  Choose a carpet design that does not hide the edge of steps of a stairway.  Be aware of any and all pets.  Review your medicines with your healthcare provider. Some medicines can cause dizziness or changes in blood pressure, which increase your risk of falling. Talk with  your health care provider about other ways that you can decrease your risk of falls. This may include working with a physical therapist or trainer to improve your strength, balance, and endurance. This information is not intended to replace advice given to you by your health care provider. Make sure you discuss any questions you have with your health care provider. Document Released: 01/01/2002 Document Revised: 06/10/2015 Document Reviewed: 02/15/2014 Elsevier Interactive Patient Education  2017 Elsevier Inc.  

## 2015-12-29 NOTE — Progress Notes (Signed)
Subjective:    Patient ID: [name], male    DOB: 1932-06-10, 82 y.o.   MRN: [id number]  Patient here today for follow up of chronic medical problems. No chnages since last visit. NO complaints today.  Outpatient Encounter Prescriptions as of 12/29/2015  Medication Sig  . amLODipine (NORVASC) 5 MG tablet Take 1 tablet (5 mg total) by mouth every morning.  . Calcium Carb-Cholecalciferol (CALCIUM 600 + D PO) Take 1 tablet by mouth daily after lunch.   . Cholecalciferol (VITAMIN D3) 5000 UNITS CAPS Take 1 capsule by mouth daily.  . Coenzyme Q10-Red Yeast Rice 60-600 MG CAPS Take 2 capsules by mouth daily. THIS PILL ALSO HAS 100MG NIACIN  . diphenhydrAMINE (BENADRYL) 25 MG tablet Take 25 mg by mouth every 6 (six) hours as needed for itching.  . enzalutamide (XTANDI) 40 MG capsule Take 160 mg by mouth daily.  Marland Kitchen FLAXSEED, LINSEED, PO Take 30 mLs by mouth every morning. GRINDS FLAX SEEDS AND PUTS IN 1/4 CUP OF WATER  . ibuprofen (ADVIL,MOTRIN) 200 MG tablet Take 400 mg by mouth every 6 (six) hours as needed for moderate pain.  Marland Kitchen Leuprolide Acetate, 6 Month, (LUPRON DEPOT) 45 MG injection Inject 45 mg into the muscle every 6 (six) months.   . levothyroxine (SYNTHROID, LEVOTHROID) 50 MCG tablet TAKE 1 TABLET (50 MCG TOTAL) BY MOUTH DAILY BEFORE BREAKFAST.  . Magnesium 300 MG CAPS Take 1 capsule by mouth daily.  . meclizine (ANTIVERT) 25 MG tablet Take 1 tablet (25 mg total) by mouth 3 (three) times daily as needed for dizziness.  . Probiotic Product (PROBIOTIC DAILY PO) Take 1 capsule by mouth daily.   No facility-administered encounter medications on file as of 12/29/2015.      Hypertension  This is a chronic problem. The current episode started more than 1 year ago. The problem is controlled. Pertinent negatives include no chest pain, headaches, palpitations or shortness of breath. Risk factors for coronary artery disease include dyslipidemia, obesity and sedentary lifestyle. Past treatments  include ACE inhibitors and calcium channel blockers. The current treatment provides moderate improvement. Compliance problems include diet and exercise.  Hypertensive end-organ damage includes a thyroid problem. There is no history of CAD/MI or CVA.  Hyperlipidemia  This is a chronic problem. The current episode started more than 1 year ago. Recent lipid tests were reviewed and are variable. Exacerbating diseases include obesity. He has no history of diabetes or hypothyroidism. Pertinent negatives include no chest pain or shortness of breath. Current antihyperlipidemic treatment includes diet change. The current treatment provides moderate improvement of lipids. Compliance problems include adherence to diet and adherence to exercise.  Risk factors for coronary artery disease include dyslipidemia, hypertension and post-menopausal.  Thyroid Problem  Symptoms include fatigue (occassionally). Patient reports no heat intolerance, palpitations or visual change. His past medical history is significant for hyperlipidemia. There is no history of diabetes.  Hx prostate cancer Had prostate cancer in 25 years ago and had a prostatectomy- was put on lupron injections. PSA started increasing again in January so Urologist put him on [place] daily. Patient last PSA was checked on 06/19/15 had gone from 5.30 to 0.05. Med has lots of side effects but seems to be working.   * Patient was seen on 02/18/15 with right sided thoracic back pain. Was told to continue with TENS unit and was given hydrocodone.    Review of Systems  Constitutional: Positive for fatigue (occassionally).  Eyes: Negative.   Respiratory: Negative for shortness  of breath.   Cardiovascular: Negative for chest pain and palpitations.  Endocrine: Negative for heat intolerance.  Genitourinary: Negative.   Neurological: Negative for headaches.  Psychiatric/Behavioral: Negative.   All other systems reviewed and are negative.      Objective:    Physical Exam  Constitutional: He is oriented to person, place, and time. He appears well-developed and well-nourished.  HENT:  Head: Normocephalic.  Right Ear: External ear normal.  Left Ear: External ear normal.  Nose: Nose normal.  Mouth/Throat: Oropharynx is clear and moist.  Eyes: EOM are normal. Pupils are equal, round, and reactive to light.  Neck: Normal range of motion. Neck supple. No thyromegaly present.  Cardiovascular: Normal rate, regular rhythm, normal heart sounds and intact distal pulses.   No murmur heard. Pulmonary/Chest: Effort normal and breath sounds normal. He has no wheezes. He has no rales.  Abdominal: Soft. Bowel sounds are normal.  Musculoskeletal: Normal range of motion.  Neurological: He is alert and oriented to person, place, and time.  Skin: Skin is warm and dry.  Psychiatric: He has a normal mood and affect. His behavior is normal. Judgment and thought content normal.   BP (!) 144/84 (BP Location: Left Arm, Cuff Size: Normal)   Pulse 68   Temp (!) 96.4 F (35.8 C) (Oral)   Ht 6' (1.829 m)   Wt 262 lb (118.8 kg)   BMI 35.53 kg/m        Assessment & Plan:   1. Essential hypertension Low sodium diet Increased amlodpine to 10mg daily Keep diary of blood pressures at home - amLODipine (NORVASC) 10 MG tablet; Take 1 tablet (10 mg total) by mouth daily.  Dispense: 90 tablet; Refill: 1 - CMP14+EGFR  2. Hypothyroidism, unspecified type - levothyroxine (SYNTHROID, LEVOTHROID) 50 MCG tablet; TAKE 1 TABLET (50 MCG TOTAL) BY MOUTH DAILY BEFORE BREAKFAST.  Dispense: 90 tablet; Refill: 1  3. BMI 33.0-33.9,adult Discussed diet and exercise for person with BMI >25 Will recheck weight in 3-6 months  4. Mixed hyperlipidemia Low fat diet - Lipid panel  5. H/O prostate cancer Keep follow up with urology    Labs pending Health maintenance reviewed Diet and exercise encouraged Continue all meds Follow up  In 3 months   Mary-Margaret ,  FNP   

## 2015-12-30 ENCOUNTER — Other Ambulatory Visit: Payer: Self-pay | Admitting: Nurse Practitioner

## 2015-12-30 ENCOUNTER — Telehealth: Payer: Self-pay | Admitting: Family Medicine

## 2015-12-30 DIAGNOSIS — C61 Malignant neoplasm of prostate: Secondary | ICD-10-CM | POA: Diagnosis not present

## 2015-12-30 DIAGNOSIS — Z5111 Encounter for antineoplastic chemotherapy: Secondary | ICD-10-CM | POA: Diagnosis not present

## 2015-12-30 LAB — CMP14+EGFR
ALBUMIN: 4.3 g/dL (ref 3.5–4.7)
ALT: 9 IU/L (ref 0–44)
AST: 14 IU/L (ref 0–40)
Albumin/Globulin Ratio: 1.7 (ref 1.2–2.2)
Alkaline Phosphatase: 60 IU/L (ref 39–117)
BUN / CREAT RATIO: 22 (ref 10–24)
BUN: 19 mg/dL (ref 8–27)
Bilirubin Total: 0.5 mg/dL (ref 0.0–1.2)
CALCIUM: 10 mg/dL (ref 8.6–10.2)
CHLORIDE: 101 mmol/L (ref 96–106)
CO2: 25 mmol/L (ref 18–29)
CREATININE: 0.85 mg/dL (ref 0.76–1.27)
GFR calc Af Amer: 94 mL/min/{1.73_m2} (ref >59)
GFR, EST NON AFRICAN AMERICAN: 82 mL/min/{1.73_m2} (ref >59)
GLOBULIN, TOTAL: 2.6 g/dL (ref 1.5–4.5)
Glucose: 97 mg/dL (ref 65–99)
Potassium: 5.5 mmol/L — ABNORMAL HIGH (ref 3.5–5.2)
SODIUM: 140 mmol/L (ref 134–144)
TOTAL PROTEIN: 6.9 g/dL (ref 6.0–8.5)

## 2015-12-30 LAB — LIPID PANEL
CHOLESTEROL TOTAL: 190 mg/dL (ref 100–199)
Chol/HDL Ratio: 4 ratio units (ref 0.0–5.0)
HDL: 48 mg/dL (ref >39)
LDL Calculated: 107 mg/dL — ABNORMAL HIGH (ref 0–99)
Triglycerides: 176 mg/dL — ABNORMAL HIGH (ref 0–149)
VLDL CHOLESTEROL CAL: 35 mg/dL (ref 5–40)

## 2015-12-30 LAB — THYROID PANEL WITH TSH
FREE THYROXINE INDEX: 2.3 (ref 1.2–4.9)
T3 Uptake Ratio: 30 % (ref 24–39)
T4, Total: 7.5 ug/dL (ref 4.5–12.0)
TSH: 6.35 u[IU]/mL — ABNORMAL HIGH (ref 0.450–4.500)

## 2015-12-30 MED ORDER — LEVOTHYROXINE SODIUM 75 MCG PO TABS: 75.0000 ug | ORAL_TABLET | Freq: Every day | ORAL | 2 refills | Status: DC

## 2015-12-30 NOTE — Telephone Encounter (Signed)
Aware of results. 

## 2016-01-01 ENCOUNTER — Telehealth: Payer: Self-pay | Admitting: *Deleted

## 2016-01-01 ENCOUNTER — Encounter (INDEPENDENT_AMBULATORY_CARE_PROVIDER_SITE_OTHER): Payer: Medicare Other | Admitting: Ophthalmology

## 2016-01-01 DIAGNOSIS — H348311 Tributary (branch) retinal vein occlusion, right eye, with retinal neovascularization: Secondary | ICD-10-CM | POA: Diagnosis not present

## 2016-01-01 DIAGNOSIS — I10 Essential (primary) hypertension: Secondary | ICD-10-CM

## 2016-01-01 DIAGNOSIS — H43813 Vitreous degeneration, bilateral: Secondary | ICD-10-CM | POA: Diagnosis not present

## 2016-01-01 DIAGNOSIS — H4311 Vitreous hemorrhage, right eye: Secondary | ICD-10-CM | POA: Diagnosis not present

## 2016-01-01 DIAGNOSIS — H35033 Hypertensive retinopathy, bilateral: Secondary | ICD-10-CM | POA: Diagnosis not present

## 2016-01-01 DIAGNOSIS — E875 Hyperkalemia: Secondary | ICD-10-CM

## 2016-01-01 NOTE — Telephone Encounter (Signed)
Pt notified of results Verbalizes understanding Will come in for repeat K+ Order entered in [place]

## 2016-01-01 NOTE — Telephone Encounter (Signed)
-----   Message from [facility] sent at 12/30/2015  9:39 AM EST ----- Kidney and liver function stable Potassium elevated- please repeat ASAP Cholesterol looks good TSH elevated- need to change levothyroxin dose- new rx sent to pharmacy- need to recheck labs in 6 weeks Continue current meds- low fat diet and exercise and recheck in 3 months

## 2016-01-02 ENCOUNTER — Other Ambulatory Visit: Payer: Medicare Other

## 2016-01-02 DIAGNOSIS — E875 Hyperkalemia: Secondary | ICD-10-CM | POA: Diagnosis not present

## 2016-01-02 NOTE — Addendum Note (Signed)
Addended by: Liliane Bade on: 01/02/2016 08:30 AM   Modules accepted: Orders

## 2016-01-03 LAB — BMP8+EGFR
BUN / CREAT RATIO: 15 (ref 10–24)
BUN: 14 mg/dL (ref 8–27)
CHLORIDE: 101 mmol/L (ref 96–106)
CO2: 24 mmol/L (ref 18–29)
Calcium: 9.7 mg/dL (ref 8.6–10.2)
Creatinine, Ser: 0.91 mg/dL (ref 0.76–1.27)
GFR calc Af Amer: 91 mL/min/{1.73_m2} (ref >59)
GFR calc non Af Amer: 79 mL/min/{1.73_m2} (ref >59)
GLUCOSE: 102 mg/dL — AB (ref 65–99)
POTASSIUM: 5 mmol/L (ref 3.5–5.2)
SODIUM: 138 mmol/L (ref 134–144)

## 2016-01-15 ENCOUNTER — Encounter: Payer: Self-pay | Admitting: Nurse Practitioner

## 2016-01-15 ENCOUNTER — Ambulatory Visit (INDEPENDENT_AMBULATORY_CARE_PROVIDER_SITE_OTHER): Payer: Medicare Other

## 2016-01-15 ENCOUNTER — Encounter: Payer: Self-pay | Admitting: Pharmacist

## 2016-01-15 ENCOUNTER — Ambulatory Visit (INDEPENDENT_AMBULATORY_CARE_PROVIDER_SITE_OTHER): Payer: Medicare Other | Admitting: Pharmacist

## 2016-01-15 VITALS — BP 136/78 | HR 74 | Ht 72.0 in | Wt 262.0 lb

## 2016-01-15 DIAGNOSIS — M85852 Other specified disorders of bone density and structure, left thigh: Secondary | ICD-10-CM

## 2016-01-15 DIAGNOSIS — Z0000 Encounter for general adult medical examination without abnormal findings: Secondary | ICD-10-CM | POA: Diagnosis not present

## 2016-01-15 DIAGNOSIS — E8881 Metabolic syndrome: Secondary | ICD-10-CM | POA: Insufficient documentation

## 2016-01-15 DIAGNOSIS — Z8546 Personal history of malignant neoplasm of prostate: Secondary | ICD-10-CM

## 2016-01-15 DIAGNOSIS — Z79899 Other long term (current) drug therapy: Secondary | ICD-10-CM

## 2016-01-15 NOTE — Patient Instructions (Addendum)
[name] , Thank you for taking time to come for your Medicare Wellness Visit. I appreciate your ongoing commitment to your health goals. Please review the following plan we discussed and let me know if I can assist you in the future.   These are the goals we discussed:  Recommend restart going to rec center for exercise or consider physical therapy for balance assessment - they can work one on one with you until you feel more comfortable about going back to fitness center.  Look for copy of [place] (important to know where these are kept - you can also bring copy to our office to be placed in our file / electronic chart)  Increase non-starchy vegetables - carrots, green bean, squash, zucchini, tomatoes, onions, peppers, spinach and other green leafy vegetables, cabbage, lettuce, cucumbers, asparagus, okra (not fried), eggplant Limit sugar and processed foods (cakes, cookies, ice cream, crackers and chips) Increase fresh fruit Limit red meat to no more than 1-2 times per week (serving size about the size of your palm) Choose whole grains / lean proteins - whole wheat bread, quinoa, whole grain rice (1/2 cup), fish, chicken, [place] Avoid sugar and calorie containing beverages - soda, sweet tea and juice.  Choose water or unsweetened tea instead.    This is a list of the screening recommended for you and due dates:  Health Maintenance  Topic Date Due  . DEXA scan (bone density measurement)  Done today  . Tetanus Vaccine  Due now - postponed - cost verified today was $59.75  . Flu Shot  Completed  . Shingles Vaccine  Completed  . Pneumonia vaccines  Completed  *Topic was postponed. The date shown is not the original due date.     Metabolic Syndrome Introduction Metabolic syndrome is the presence of at least three factors that increase your risk of getting cardiovascular disease and diabetes. These factors are:  High blood  sugar.  High blood triglyceride level.  High blood pressure.  Low levels of good blood cholesterol (high-density lipoprotein or HDL).  Excess weight around the waist. This factor is present with a waist measurement of:  More than 40 inches in men.  More than 35 inches in women. Metabolic syndrome is sometimes called insulin resistance syndrome and syndrome X. What are the causes? The exact cause is not known, but genetics and lifestyle choices play a role. What increases the risk? You are more likely to develop metabolic syndrome if:  You eat a diet high in calories and saturated fat.  You do not exercise regularly.  You are overweight.  You have a family history of metabolic syndrome.  You are Asian.  You are older in age.  You have insulin resistance.  You use any tobacco products, including cigarettes, chewing tobacco, or electronic cigarettes. What are the signs or symptoms? Metabolic syndrome has no specific symptoms. How is this diagnosed? To make a diagnosis, your health care provider will determine whether you have at least three of the factors that make up metabolic syndrome by:  Taking your blood pressure.  Measuring your waist.  Ordering blood tests. How is this treated? Treatment may include:  Lifestyle changes to reduce your risk for heart disease and stroke, such as:  Exercise.  Weight loss.  Maintaining a healthy diet.  Quitting the use of any tobacco products, including cigarettes, chewing tobacco, or electronic cigarettes.  Medicines that:  Help your body to maintain glucose control.  Reduce your blood pressure and your blood triglyceride levels. Follow these instructions at home:  Exercise regularly.  Maintain a healthy diet.  Do not use any tobacco products, including cigarettes, chewing tobacco, or electronic cigarettes. If you need help quitting, ask your health care provider.  Keep all follow-up visits as directed by your  health care provider. This is important.  Measure your waist regularly and record the measurement. To measure your waist:  Stand up straight.  Breathe out.  Wrap the measuring tape around the part of your waist that is just above your hipbones.  Read the measurement. Contact a health care provider if:  You feel very tired.  You develop excessive thirst.  You pass large quantities of urine.  You put on weight around your waist.  You have headaches over and over again.  You have a dizzy spell. Get help right away if:  You develop sudden blurred vision.  You develop a sudden dizzy spell.  You have sudden trouble speaking or swallowing.  You have sudden weakness in your arm or leg.  You have chest pains or trouble breathing.  You feel like your heartbeat is abnormal.  You faint. This information is not intended to replace advice given to you by your health care provider. Make sure you discuss any questions you have with your health care provider. Document Released: 04/20/2007 Document Revised: 06/19/2015 Document Reviewed: 08/17/2013  2017 Elsevier

## 2016-01-15 NOTE — Progress Notes (Signed)
Patient ID: [name], male   DOB: September 01, 1932, 82 y.o.   MRN: [id number]    Subjective:   [name] is a 82 y.o. male who presents for a subsequent Medicare Annual Wellness Visit.   [name] is married and lives with his wife in [place].  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 He has 2 adult children who offer support to patient and his wife when needed.   [name] had knee surgery about 5 months ago and has not restarted exercise.  He uses a cane for stabilization while walking.   [name] also reports that he has a hemorrhage in his right eye about 1 month ago and is being followed by [name].    He continues to see [name] for prostate cancer and brings in copy today of his most recent PSA results which had decreased significantly since starting Xtandi.  Current Medications (verified) Outpatient Encounter Prescriptions as of 01/15/2016  Medication Sig  . amLODipine (NORVASC) 10 MG tablet Take 1 tablet (10 mg total) by mouth daily.  . Calcium Carb-Cholecalciferol (CALCIUM 600 + D PO) Take 1 tablet by mouth daily after lunch.   . Cholecalciferol (VITAMIN D3) 5000 UNITS CAPS Take 1 capsule by mouth daily.  . Coenzyme Q10-Red Yeast Rice 60-600 MG CAPS Take 2 capsules by mouth daily. THIS PILL ALSO HAS '100MG'$  NIACIN  . diphenhydrAMINE (BENADRYL) 25 MG tablet Take 25 mg by mouth every 6 (six) hours as needed for itching.  . enzalutamide (XTANDI) 40 MG capsule Take 160 mg by mouth daily.  Marland Kitchen FLAXSEED, LINSEED, PO Take 30 mLs by mouth every morning. GRINDS FLAX SEEDS AND PUTS IN 1/4 CUP OF WATER  . ibuprofen (ADVIL,MOTRIN) 200 MG tablet Take 400 mg by mouth every 6 (six) hours as needed for moderate pain.  Marland Kitchen Leuprolide Acetate, 6 Month, (LUPRON DEPOT) 45 MG injection Inject 45 mg into the muscle every 6 (six) months.   . levothyroxine (SYNTHROID) 75 MCG tablet Take 1 tablet (75 mcg total) by mouth daily before breakfast.  . Magnesium 300 MG CAPS Take 1 capsule  by mouth daily.  . Probiotic Product (PROBIOTIC DAILY PO) Take 1 capsule by mouth daily.  . [DISCONTINUED] meclizine (ANTIVERT) 25 MG tablet Take 1 tablet (25 mg total) by mouth 3 (three) times daily as needed for dizziness. (Patient not taking: Reported on 01/15/2016)   No facility-administered encounter medications on file as of 01/15/2016.     Allergies (verified) Caffeine and Codeine   History: Past Medical History:  Diagnosis Date  . Arthritis   . BNC (bladder neck contracture)   . BPPV (benign paroxysmal positional vertigo)   . Cancer [facility])    prostate  . Cataract    right  . Diverticulosis   . Foley catheter in place   . Foley catheter in place   . H/O diarrhea SECONDARY TO RADIATION THERAPY --  INTERMITANT  DIARRHEA  . Hematuria   . History of hemorrhagic cystitis    SECONDARY RADIATION THERAPY 1997  . History of prostate cancer CURRENTLY  ELEVATED PSA--  LUPRON INJECTIONS   S/P PROSTATECTOMY AND RADIATION THERAPY  1997  . Hyperlipidemia   . Hypertension   . Hypothyroidism   . Radiation cystitis   . Radiation proctitis   . Urinary retention   . Vitamin D deficiency    Past Surgical History:  Procedure Laterality Date  . CATARACT EXTRACTION W/ INTRAOCULAR LENS IMPLANT Right   .  COLONOSCOPY W/ POLYPECTOMY  2005; 08/11/2010   2005: 1 cm hyperplastic sigmoid polyp, Rodney. Wynetta Holmes 2012: hyperplastic splenic flexure polyp -1 cm, diverticulosis, radiation proctitis, anal stenosis  . CYSTO/ FULGERATION OF BLEEDERS/ RESECTION BLADDER NECK CONTRACTURE  07-06-2004   BNC AND RADIATION CYSTITIS  . EYE SURGERY    . LUMBAR DISC SURGERY  1996   L4 -- L5  . PROSTATECTOMY  1997  . SPINE SURGERY    . TRANSURETHRAL RESECTION OF BLADDER NECK N/A 06/23/2012   Procedure: TRANSURETHRAL RESECTION OF BLADDER NECK CONTRACTURE WITH GYRUS;  Surgeon: Rodney Jabs, MD;  Location: [facility];  Service: Urology;  Laterality: N/A;  . TRANSURETHRAL RESECTION OF BLADDER TUMOR WITH  GYRUS (TURBT-GYRUS) N/A 06/01/2013   Procedure: TRANSURETHRAL RESECTION OF BLADDER NECK CONTRACTURE WITH GYRUS (TURBT-GYRUS) AND INJECTION OF KENALOG;  Surgeon: Rodney Jabs, MD;  Location: [facility];  Service: Urology;  Laterality: N/A;   Family History  Problem Relation Age of Onset  . Prostate cancer Brother   . Kidney disease Brother   . Congestive Heart Failure Mother   . Pancreatitis Father   . Pneumonia Father   . Cancer Sister     breast  . Obesity Sister   . Early death Sister     appendicitis  . Cancer Sister     breast  . Alzheimer's disease Brother   . Hip fracture Brother    Social History   Occupational History  . Not on file.   Social History Main Topics  . Smoking status: Former Smoker    Years: 20.00    Types: Cigarettes    Quit date: 07/28/1979  . Smokeless tobacco: Former Systems developer    Types: [place] date: 07/28/1979     Comment: SMOKED AND CHEW TOBACCO FOR 20 YRS QUIT AGE 18  . Alcohol use No  . Drug use: No  . Sexual activity: No    Do you feel safe at home?  Yes Are there smokers in your home (other than you)? No  Dietary issues and exercise activities discussed: Current Exercise Habits: The patient does not participate in regular exercise at present, Exercise limited by: orthopedic condition(s)  Last year patient was going to [place] 5 days per week but his decreased due to knee surgery and patient has not restarted  Current Dietary habits:  Patient is limiting potassium rich foods due to elevated potassium in early December.  He has not been trying to limit sweets or high caloric containing foods or following any other dietary recommendations   Cardiac Risk Factors include: advanced age (>13mn, >>66women);dyslipidemia;hypertension;male gender;obesity (BMI >30kg/m2)  Objective:    Today's Vitals   01/15/16 0841  BP: 136/78  Pulse: 74  Weight: 262 lb (118.8 kg)  Height: 6' (1.829 m)  PainSc: 2   PainLoc: Knee   Body  mass index is 35.53 kg/m.   Weight has increased by 8 lbs since last year's AWV  DEXA results from today (01/15/2016) AP Spine T Score = 1.1 Neck of Right Femur = -2.3 Left Forearm 33% Radius = 0.1  Previous DEXA from 05/2012 (performed at [facility]) AP Spine T-Score = 1.3 Neck of Left Femur = -1.2  Elevated TSH 12/29/2015 - levothyroxine dose increased 12/2015 Calcium = 9.7 (01/02/16) No recent vitamin D level GFR = 82 (01/02/2016)   Activities of Daily Living In your present state of health, do you have any difficulty performing the following activities: 01/15/2016 01/16/2015  Hearing? N N  Vision? Y N  Difficulty concentrating or making decisions? N N  Walking or climbing stairs? Y N  Dressing or bathing? N N  Doing errands, shopping? N N  Preparing Food and eating ? N N  Using the Toilet? N N  In the past six months, have you accidently leaked urine? N Y  Do you have problems with loss of bowel control? N N  Managing your Medications? N N  Managing your Finances? N N  Housekeeping or managing your Housekeeping? N N  Some recent data might be hidden     Depression Screen PHQ 2/9 Scores 01/15/2016 12/29/2015 07/18/2015 06/27/2015  PHQ - 2 Score 0 0 0 0     Fall Risk Fall Risk  01/15/2016 12/29/2015 07/18/2015 06/27/2015 01/16/2015  Falls in the past year? Yes No No No No  Number falls in past yr: 1 - - - -  Injury with Fall? No - - - -  Follow up Falls prevention discussed - - - -    Cognitive Function: MMSE - Mini Mental State Exam 01/15/2016 01/16/2015  Orientation to time 5 5  Orientation to Place 5 5  Registration 3 3  Attention/ Calculation 3 3  Recall 3 3  Language- name 2 objects 2 2  Language- repeat 1 1  Language- follow 3 step command 3 3  Language- read & follow direction 1 1  Write a sentence 1 1  Copy design 1 0  Total score 28 27   Timed Up and Go Test = 20 seconds Observed short strides, shuffling.  Patient is using cane for  ambulation  Immunizations and Health Maintenance Immunization History  Administered Date(s) Administered  . Hepatitis B 07/22/1993, 08/26/1993, 04/28/1994  . Influenza, High Dose Seasonal PF 11/10/2015  . Influenza,inj,Quad PF,36+ Mos 11/07/2013  . Influenza-Unspecified 11/25/2012, 12/09/2014  . Pneumococcal Conjugate-13 01/14/2014  . Pneumococcal Polysaccharide-23 07/04/2012  . Zoster 01/05/2013   There are no preventive care reminders to display for this patient.  Patient Care Team: Wardell Honour, MD as PCP - General (Family Medicine) Kathie Rhodes, MD as Consulting Physician (Urology) Sandford Craze, MD as Consulting Physician (Dermatology) Hayden Pedro, MD as Consulting Physician (Ophthalmology)  Indicate any recent Medical Services you may have received from other than [facility] providers in the past year (date may be approximate).    Assessment:    Annual Wellness Visit  High Fall Risk Metabolic Syndrome / Obesity Osteopenia related to androgen depravation therapy / Lupron to treat prostate cancer - 10 years FRAX estimate is 11% for over all fracture risk and 4.7% for hip fracture.   Screening Tests Health Maintenance  Topic Date Due  . DEXA SCAN  06/28/2016 (Originally 06/14/2014)  . TETANUS/TDAP  06/28/2016 (Originally 06/28/1951)  . INFLUENZA VACCINE  Completed  . ZOSTAVAX  Completed  . PNA vac Low Risk Adult  Completed        Plan:   During the course of the visit Jadarious was educated and counseled about the following appropriate screening and preventive services:   Patient is UTD on the following vaccines: Pneumoccal, Influenza, Hepatitis B and Zostavax  Patient is due Tdap but declined today due to cost ($59.75)  Colorectal cancer screening - Per note from Rodney Carlean Purl from 2012 patient doesn't need repeat colonoscopy unless he is having GI related symptoms to indicate need for colonosocpy  BP was at goal today - continue amlodipine '10mg'$  daily  Metabolic  Syndrome and elevated TG -  Lipids at goal at last check except triglycerides slightly elevated - discussed dietary changes to help decrease both weight and triglycerides  Diabetes screening - last FBG was WNL at 97 (12/29/2015)  Bone Denisty / Osteoporosis Screening - done today showed decreased BMD and high fracture risk will consult with his urologist who has followed BMD due to Lupron therapy and start pharmacotherapy as needed.   Continue to take calcium + D '600mg'$  daily and vitamin D 5000 iu daily - adding vitamin D level to labs drawn 01/02/16  Glaucoma screening  - UTD  Nutrition counseling - Discussed dietary changes to help lower triglycerides  Goal weight loss is 10 lbs over the next 3 months.  Prostate cancer screening - UTD, sees urologist regularly due to history of prostate CA.  Copy of last PSA send to be scanned to chart  Advanced Directives - UTD, copy requested  Increase physical activity - discussed PT referral for balance assessment but patient declined.  He plans to go back to [facility] for exercise.       Patient Instructions (the written plan) were given to the patient.   Cherre Robins, PharmD   01/15/2016

## 2016-01-16 ENCOUNTER — Ambulatory Visit: Payer: Self-pay | Admitting: Pharmacist

## 2016-01-20 ENCOUNTER — Telehealth: Payer: Self-pay | Admitting: *Deleted

## 2016-01-20 ENCOUNTER — Other Ambulatory Visit: Payer: Self-pay | Admitting: Nurse Practitioner

## 2016-01-20 NOTE — Telephone Encounter (Signed)
Spoke with pt and he is aware. 

## 2016-01-20 NOTE — Telephone Encounter (Signed)
Vitamin d 2000iu OTC daily

## 2016-01-27 ENCOUNTER — Encounter (INDEPENDENT_AMBULATORY_CARE_PROVIDER_SITE_OTHER): Payer: Medicare Other | Admitting: Ophthalmology

## 2016-01-27 DIAGNOSIS — I10 Essential (primary) hypertension: Secondary | ICD-10-CM | POA: Diagnosis not present

## 2016-01-27 DIAGNOSIS — H35033 Hypertensive retinopathy, bilateral: Secondary | ICD-10-CM | POA: Diagnosis not present

## 2016-01-27 DIAGNOSIS — H43813 Vitreous degeneration, bilateral: Secondary | ICD-10-CM | POA: Diagnosis not present

## 2016-01-27 DIAGNOSIS — H3509 Other intraretinal microvascular abnormalities: Secondary | ICD-10-CM

## 2016-01-29 DIAGNOSIS — D2371 Other benign neoplasm of skin of right lower limb, including hip: Secondary | ICD-10-CM | POA: Diagnosis not present

## 2016-01-29 DIAGNOSIS — M79671 Pain in right foot: Secondary | ICD-10-CM | POA: Diagnosis not present

## 2016-01-29 NOTE — Progress Notes (Signed)
DEXA results sent to patient's urologist.  Recommended patient continue calcium and vitamin D supplementation for osteopenia.

## 2016-03-25 ENCOUNTER — Encounter (INDEPENDENT_AMBULATORY_CARE_PROVIDER_SITE_OTHER): Payer: Medicare Other | Admitting: Ophthalmology

## 2016-03-25 DIAGNOSIS — H35033 Hypertensive retinopathy, bilateral: Secondary | ICD-10-CM | POA: Diagnosis not present

## 2016-03-25 DIAGNOSIS — H3509 Other intraretinal microvascular abnormalities: Secondary | ICD-10-CM

## 2016-03-25 DIAGNOSIS — H43813 Vitreous degeneration, bilateral: Secondary | ICD-10-CM

## 2016-03-25 DIAGNOSIS — I10 Essential (primary) hypertension: Secondary | ICD-10-CM

## 2016-03-25 DIAGNOSIS — H4311 Vitreous hemorrhage, right eye: Secondary | ICD-10-CM

## 2016-03-30 ENCOUNTER — Ambulatory Visit (INDEPENDENT_AMBULATORY_CARE_PROVIDER_SITE_OTHER): Payer: Medicare Other | Admitting: Nurse Practitioner

## 2016-03-30 ENCOUNTER — Encounter: Payer: Self-pay | Admitting: Nurse Practitioner

## 2016-03-30 VITALS — BP 138/78 | HR 69 | Temp 96.8°F | Ht 72.0 in | Wt 255.0 lb

## 2016-03-30 DIAGNOSIS — E782 Mixed hyperlipidemia: Secondary | ICD-10-CM

## 2016-03-30 DIAGNOSIS — E8881 Metabolic syndrome: Secondary | ICD-10-CM

## 2016-03-30 DIAGNOSIS — Z6834 Body mass index (BMI) 34.0-34.9, adult: Secondary | ICD-10-CM

## 2016-03-30 DIAGNOSIS — I10 Essential (primary) hypertension: Secondary | ICD-10-CM | POA: Diagnosis not present

## 2016-03-30 DIAGNOSIS — E039 Hypothyroidism, unspecified: Secondary | ICD-10-CM | POA: Diagnosis not present

## 2016-03-30 DIAGNOSIS — Z8546 Personal history of malignant neoplasm of prostate: Secondary | ICD-10-CM | POA: Diagnosis not present

## 2016-03-30 MED ORDER — AMLODIPINE BESYLATE 10 MG PO TABS: 10.0000 mg | ORAL_TABLET | Freq: Every day | ORAL | 1 refills | Status: DC

## 2016-03-30 MED ORDER — LEVOTHYROXINE SODIUM 75 MCG PO TABS: 75.0000 ug | ORAL_TABLET | Freq: Every day | ORAL | 2 refills | Status: DC

## 2016-03-30 NOTE — Progress Notes (Signed)
Subjective:    Patient ID: [name], male    DOB: 22-Feb-1932, 83 y.o.   MRN: [id number]  Patient here today for follow up of chronic medical problems. No chnages since last visit. NO complaints today.  Outpatient Encounter Prescriptions as of 03/30/2016  Medication Sig  . amLODipine (NORVASC) 10 MG tablet Take 1 tablet (10 mg total) by mouth daily.  . Calcium Carb-Cholecalciferol (CALCIUM 600 + D PO) Take 1 tablet by mouth daily after lunch.   . Cholecalciferol (VITAMIN D3) 5000 UNITS CAPS Take 1 capsule by mouth daily.  . Coenzyme Q10-Red Yeast Rice 60-600 MG CAPS Take 2 capsules by mouth daily. THIS PILL ALSO HAS 100MG NIACIN  . diphenhydrAMINE (BENADRYL) 25 MG tablet Take 25 mg by mouth every 6 (six) hours as needed for itching.  . enzalutamide (XTANDI) 40 MG capsule Take 160 mg by mouth daily.  Marland Kitchen FLAXSEED, LINSEED, PO Take 30 mLs by mouth every morning. GRINDS FLAX SEEDS AND PUTS IN 1/4 CUP OF WATER  . ibuprofen (ADVIL,MOTRIN) 200 MG tablet Take 400 mg by mouth every 6 (six) hours as needed for moderate pain.  Marland Kitchen Leuprolide Acetate, 6 Month, (LUPRON DEPOT) 45 MG injection Inject 45 mg into the muscle every 6 (six) months.   . levothyroxine (SYNTHROID) 75 MCG tablet Take 1 tablet (75 mcg total) by mouth daily before breakfast.  . Magnesium 300 MG CAPS Take 1 capsule by mouth daily.  . Probiotic Product (PROBIOTIC DAILY PO) Take 1 capsule by mouth daily.   No facility-administered encounter medications on file as of 03/30/2016.      Hypertension  This is a chronic problem. The current episode started more than 1 year ago. The problem is controlled. Pertinent negatives include no chest pain, headaches, palpitations or shortness of breath. Risk factors for coronary artery disease include dyslipidemia, obesity and sedentary lifestyle. Past treatments include ACE inhibitors and calcium channel blockers. The current treatment provides moderate improvement. Compliance problems include diet and  exercise.  There is no history of CAD/MI or CVA. Identifiable causes of hypertension include a thyroid problem.  Hyperlipidemia  This is a chronic problem. The current episode started more than 1 year ago. Recent lipid tests were reviewed and are variable. Exacerbating diseases include obesity. He has no history of diabetes or hypothyroidism. Pertinent negatives include no chest pain or shortness of breath. Current antihyperlipidemic treatment includes diet change. The current treatment provides moderate improvement of lipids. Compliance problems include adherence to diet and adherence to exercise.  Risk factors for coronary artery disease include dyslipidemia, hypertension and post-menopausal.  Thyroid Problem  Symptoms include fatigue (occassionally). Patient reports no heat intolerance, palpitations or visual change. His past medical history is significant for hyperlipidemia. There is no history of diabetes.  Hx prostate cancer Had prostate cancer in 25 years ago and had a prostatectomy- was put on lupron injections. PSA started increasing again in January so Urologist put him on [place] daily. Patient last PSA was checked on 12/22/15 had gone from 5.30 to 0.05. Med has lots of side effects but seems to be working.    Review of Systems  Constitutional: Positive for fatigue (occassionally).  Eyes: Negative.   Respiratory: Negative for shortness of breath.   Cardiovascular: Negative for chest pain and palpitations.  Endocrine: Negative for heat intolerance.  Genitourinary: Negative.   Neurological: Negative for headaches.  Psychiatric/Behavioral: Negative.   All other systems reviewed and are negative.      Objective:   Physical Exam  Constitutional: He is oriented to person, place, and time. He appears well-developed and well-nourished.  HENT:  Head: Normocephalic.  Right Ear: External ear normal.  Left Ear: External ear normal.  Nose: Nose normal.  Mouth/Throat: Oropharynx is clear  and moist.  Eyes: EOM are normal. Pupils are equal, round, and reactive to light.  Neck: Normal range of motion. Neck supple. No thyromegaly present.  Cardiovascular: Normal rate, regular rhythm, normal heart sounds and intact distal pulses.   No murmur heard. Pulmonary/Chest: Effort normal and breath sounds normal. He has no wheezes. He has no rales.  Abdominal: Soft. Bowel sounds are normal.  Musculoskeletal: Normal range of motion.  Neurological: He is alert and oriented to person, place, and time.  Skin: Skin is warm and dry.  Psychiatric: He has a normal mood and affect. His behavior is normal. Judgment and thought content normal.   BP 138/78   Pulse 69   Temp (!) 96.8 F (36 C) (Oral)   Ht 6' (1.829 m)   Wt 255 lb (115.7 kg)   SpO2 98%   BMI 34.58 kg/m        Assessment & Plan:  1. Essential hypertension Dash diet - amLODipine (NORVASC) 10 MG tablet; Take 1 tablet (10 mg total) by mouth daily.  Dispense: 90 tablet; Refill: 1 - CMP14+EGFR  2. Hypothyroidism, unspecified type - levothyroxine (SYNTHROID) 75 MCG tablet; Take 1 tablet (75 mcg total) by mouth daily before breakfast.  Dispense: 30 tablet; Refill: 2 - Thyroid Panel With TSH  3. BMI 34.0-34.9,adult Discussed diet and exercise for person with BMI >25 Will recheck weight in 3-6 months  4. Mixed hyperlipidemia Low fat diet - Lipid panel  5. Metabolic syndrome Watch carbs in diet  6. H/O prostate cancer Will repeat PSA to make sure meds are still working - PSA, total and free    Labs pending Health maintenance reviewed Diet and exercise encouraged Continue all meds Follow up  In 6 months   [place], FNP

## 2016-03-30 NOTE — Patient Instructions (Signed)
DASH Eating Plan DASH stands for "Dietary Approaches to Stop Hypertension." The DASH eating plan is a healthy eating plan that has been shown to reduce high blood pressure (hypertension). It may also reduce your risk for type 2 diabetes, heart disease, and stroke. The DASH eating plan may also help with weight loss. What are tips for following this plan? General guidelines  Avoid eating more than 2,300 mg (milligrams) of salt (sodium) a day. If you have hypertension, you may need to reduce your sodium intake to 1,500 mg a day.  Limit alcohol intake to no more than 1 drink a day for nonpregnant women and 2 drinks a day for men. One drink equals 12 oz of beer, 5 oz of wine, or 1 oz of hard liquor.  Work with your health care provider to maintain a healthy body weight or to lose weight. Ask what an ideal weight is for you.  Get at least 30 minutes of exercise that causes your heart to beat faster (aerobic exercise) most days of the week. Activities may include walking, swimming, or biking.  Work with your health care provider or diet and nutrition specialist (dietitian) to adjust your eating plan to your individual calorie needs. Reading food labels  Check food labels for the amount of sodium per serving. Choose foods with less than 5 percent of the Daily Value of sodium. Generally, foods with less than 300 mg of sodium per serving fit into this eating plan.  To find whole grains, look for the word "whole" as the first word in the ingredient list. Shopping  Buy products labeled as "low-sodium" or "no salt added."  Buy fresh foods. Avoid canned foods and premade or frozen meals. Cooking  Avoid adding salt when cooking. Use salt-free seasonings or herbs instead of table salt or sea salt. Check with your health care provider or pharmacist before using salt substitutes.  Do not fry foods. Cook foods using healthy methods such as baking, boiling, grilling, and broiling instead.  Cook with  heart-healthy oils, such as olive, canola, soybean, or sunflower oil. Meal planning   Eat a balanced diet that includes: ? 5 or more servings of fruits and vegetables each day. At each meal, try to fill half of your plate with fruits and vegetables. ? Up to 6-8 servings of whole grains each day. ? Less than 6 oz of lean meat, poultry, or fish each day. A 3-oz serving of meat is about the same size as a deck of cards. One egg equals 1 oz. ? 2 servings of low-fat dairy each day. ? A serving of nuts, seeds, or beans 5 times each week. ? Heart-healthy fats. Healthy fats called Omega-3 fatty acids are found in foods such as flaxseeds and coldwater fish, like sardines, salmon, and mackerel.  Limit how much you eat of the following: ? Canned or prepackaged foods. ? Food that is high in trans fat, such as fried foods. ? Food that is high in saturated fat, such as fatty meat. ? Sweets, desserts, sugary drinks, and other foods with added sugar. ? Full-fat dairy products.  Do not salt foods before eating.  Try to eat at least 2 vegetarian meals each week.  Eat more home-cooked food and less restaurant, buffet, and fast food.  When eating at a restaurant, ask that your food be prepared with less salt or no salt, if possible. What foods are recommended? The items listed may not be a complete list. Talk with your dietitian about what   dietary choices are best for you. Grains Whole-grain or whole-wheat bread. Whole-grain or whole-wheat pasta. Brown rice. Oatmeal. Quinoa. Bulgur. Whole-grain and low-sodium cereals. Pita bread. Low-fat, low-sodium crackers. Whole-wheat flour tortillas. Vegetables Fresh or frozen vegetables (raw, steamed, roasted, or grilled). Low-sodium or reduced-sodium tomato and vegetable juice. Low-sodium or reduced-sodium tomato sauce and tomato paste. Low-sodium or reduced-sodium canned vegetables. Fruits All fresh, dried, or frozen fruit. Canned fruit in natural juice (without  added sugar). Meat and other protein foods Skinless chicken or turkey. Ground chicken or turkey. Pork with fat trimmed off. Fish and seafood. Egg whites. Dried beans, peas, or lentils. Unsalted nuts, nut butters, and seeds. Unsalted canned beans. Lean cuts of beef with fat trimmed off. Low-sodium, lean deli meat. Dairy Low-fat (1%) or fat-free (skim) milk. Fat-free, low-fat, or reduced-fat cheeses. Nonfat, low-sodium ricotta or cottage cheese. Low-fat or nonfat yogurt. Low-fat, low-sodium cheese. Fats and oils Soft margarine without trans fats. Vegetable oil. Low-fat, reduced-fat, or light mayonnaise and salad dressings (reduced-sodium). Canola, safflower, olive, soybean, and sunflower oils. Avocado. Seasoning and other foods Herbs. Spices. Seasoning mixes without salt. Unsalted popcorn and pretzels. Fat-free sweets. What foods are not recommended? The items listed may not be a complete list. Talk with your dietitian about what dietary choices are best for you. Grains Baked goods made with fat, such as croissants, muffins, or some breads. Dry pasta or rice meal packs. Vegetables Creamed or fried vegetables. Vegetables in a cheese sauce. Regular canned vegetables (not low-sodium or reduced-sodium). Regular canned tomato sauce and paste (not low-sodium or reduced-sodium). Regular tomato and vegetable juice (not low-sodium or reduced-sodium). Pickles. Olives. Fruits Canned fruit in a light or heavy syrup. Fried fruit. Fruit in cream or butter sauce. Meat and other protein foods Fatty cuts of meat. Ribs. Fried meat. Bacon. Sausage. Bologna and other processed lunch meats. Salami. Fatback. Hotdogs. Bratwurst. Salted nuts and seeds. Canned beans with added salt. Canned or smoked fish. Whole eggs or egg yolks. Chicken or turkey with skin. Dairy Whole or 2% milk, cream, and half-and-half. Whole or full-fat cream cheese. Whole-fat or sweetened yogurt. Full-fat cheese. Nondairy creamers. Whipped toppings.  Processed cheese and cheese spreads. Fats and oils Butter. Stick margarine. Lard. Shortening. Ghee. Bacon fat. Tropical oils, such as coconut, palm kernel, or palm oil. Seasoning and other foods Salted popcorn and pretzels. Onion salt, garlic salt, seasoned salt, table salt, and sea salt. Worcestershire sauce. Tartar sauce. Barbecue sauce. Teriyaki sauce. Soy sauce, including reduced-sodium. Steak sauce. Canned and packaged gravies. Fish sauce. Oyster sauce. Cocktail sauce. Horseradish that you find on the shelf. Ketchup. Mustard. Meat flavorings and tenderizers. Bouillon cubes. Hot sauce and Tabasco sauce. Premade or packaged marinades. Premade or packaged taco seasonings. Relishes. Regular salad dressings. Where to find more information:  National Heart, Lung, and Blood Institute: www.nhlbi.nih.gov  American Heart Association: www.heart.org Summary  The DASH eating plan is a healthy eating plan that has been shown to reduce high blood pressure (hypertension). It may also reduce your risk for type 2 diabetes, heart disease, and stroke.  With the DASH eating plan, you should limit salt (sodium) intake to 2,300 mg a day. If you have hypertension, you may need to reduce your sodium intake to 1,500 mg a day.  When on the DASH eating plan, aim to eat more fresh fruits and vegetables, whole grains, lean proteins, low-fat dairy, and heart-healthy fats.  Work with your health care provider or diet and nutrition specialist (dietitian) to adjust your eating plan to your individual   calorie needs. This information is not intended to replace advice given to you by your health care provider. Make sure you discuss any questions you have with your health care provider. Document Released: 12/31/2010 Document Revised: 01/05/2016 Document Reviewed: 01/05/2016 Elsevier Interactive Patient Education  2017 Elsevier Inc.  

## 2016-03-31 LAB — THYROID PANEL WITH TSH
FREE THYROXINE INDEX: 2.1 (ref 1.2–4.9)
T3 Uptake Ratio: 30 % (ref 24–39)
T4, Total: 7 ug/dL (ref 4.5–12.0)
TSH: 5.82 u[IU]/mL — AB (ref 0.450–4.500)

## 2016-03-31 LAB — CMP14+EGFR
A/G RATIO: 1.7 (ref 1.2–2.2)
ALT: 10 IU/L (ref 0–44)
AST: 15 IU/L (ref 0–40)
Albumin: 4.3 g/dL (ref 3.5–4.7)
Alkaline Phosphatase: 55 IU/L (ref 39–117)
BILIRUBIN TOTAL: 0.5 mg/dL (ref 0.0–1.2)
BUN/Creatinine Ratio: 14 (ref 10–24)
BUN: 12 mg/dL (ref 8–27)
CHLORIDE: 98 mmol/L (ref 96–106)
CO2: 25 mmol/L (ref 18–29)
Calcium: 9.9 mg/dL (ref 8.6–10.2)
Creatinine, Ser: 0.87 mg/dL (ref 0.76–1.27)
GFR calc Af Amer: 94 mL/min/{1.73_m2} (ref >59)
GFR calc non Af Amer: 81 mL/min/{1.73_m2} (ref >59)
Globulin, Total: 2.5 g/dL (ref 1.5–4.5)
Glucose: 100 mg/dL — ABNORMAL HIGH (ref 65–99)
POTASSIUM: 5.1 mmol/L (ref 3.5–5.2)
Sodium: 138 mmol/L (ref 134–144)
Total Protein: 6.8 g/dL (ref 6.0–8.5)

## 2016-03-31 LAB — LIPID PANEL
Chol/HDL Ratio: 3.7 ratio units (ref 0.0–5.0)
Cholesterol, Total: 181 mg/dL (ref 100–199)
HDL: 49 mg/dL (ref >39)
LDL Calculated: 93 mg/dL (ref 0–99)
TRIGLYCERIDES: 195 mg/dL — AB (ref 0–149)
VLDL Cholesterol Cal: 39 mg/dL (ref 5–40)

## 2016-03-31 LAB — PSA, TOTAL AND FREE: PSA, Free: <0.01 ng/mL

## 2016-04-01 ENCOUNTER — Telehealth: Payer: Self-pay | Admitting: Family Medicine

## 2016-04-01 ENCOUNTER — Other Ambulatory Visit: Payer: Self-pay | Admitting: Nurse Practitioner

## 2016-04-01 MED ORDER — LEVOTHYROXINE SODIUM 88 MCG PO CAPS: 1.0000 | ORAL_CAPSULE | Freq: Every day | ORAL | 5 refills | Status: DC

## 2016-04-01 NOTE — Telephone Encounter (Signed)
mailed

## 2016-04-03 ENCOUNTER — Encounter (HOSPITAL_COMMUNITY): Payer: Self-pay | Admitting: Emergency Medicine

## 2016-04-03 ENCOUNTER — Emergency Department (HOSPITAL_COMMUNITY): Payer: Medicare Other

## 2016-04-03 ENCOUNTER — Inpatient Hospital Stay (HOSPITAL_COMMUNITY)
Admission: EM | Admit: 2016-04-03 | Discharge: 2016-04-05 | DRG: 069 | Disposition: A | Discharge status: Home / Self Care | Payer: Medicare Other | Attending: Internal Medicine | Admitting: Internal Medicine

## 2016-04-03 DIAGNOSIS — Z8673 Personal history of transient ischemic attack (TIA), and cerebral infarction without residual deficits: Secondary | ICD-10-CM | POA: Diagnosis not present

## 2016-04-03 DIAGNOSIS — H8110 Benign paroxysmal vertigo, unspecified ear: Secondary | ICD-10-CM | POA: Diagnosis present

## 2016-04-03 DIAGNOSIS — Z961 Presence of intraocular lens: Secondary | ICD-10-CM | POA: Diagnosis present

## 2016-04-03 DIAGNOSIS — E782 Mixed hyperlipidemia: Secondary | ICD-10-CM

## 2016-04-03 DIAGNOSIS — I6523 Occlusion and stenosis of bilateral carotid arteries: Secondary | ICD-10-CM | POA: Diagnosis not present

## 2016-04-03 DIAGNOSIS — E559 Vitamin D deficiency, unspecified: Secondary | ICD-10-CM | POA: Diagnosis present

## 2016-04-03 DIAGNOSIS — Z885 Allergy status to narcotic agent status: Secondary | ICD-10-CM

## 2016-04-03 DIAGNOSIS — Z8546 Personal history of malignant neoplasm of prostate: Secondary | ICD-10-CM

## 2016-04-03 DIAGNOSIS — Z6833 Body mass index (BMI) 33.0-33.9, adult: Secondary | ICD-10-CM | POA: Diagnosis not present

## 2016-04-03 DIAGNOSIS — M8588 Other specified disorders of bone density and structure, other site: Secondary | ICD-10-CM | POA: Diagnosis not present

## 2016-04-03 DIAGNOSIS — E785 Hyperlipidemia, unspecified: Secondary | ICD-10-CM | POA: Diagnosis not present

## 2016-04-03 DIAGNOSIS — G458 Other transient cerebral ischemic attacks and related syndromes: Secondary | ICD-10-CM

## 2016-04-03 DIAGNOSIS — I10 Essential (primary) hypertension: Secondary | ICD-10-CM | POA: Diagnosis not present

## 2016-04-03 DIAGNOSIS — Z8042 Family history of malignant neoplasm of prostate: Secondary | ICD-10-CM | POA: Diagnosis not present

## 2016-04-03 DIAGNOSIS — E039 Hypothyroidism, unspecified: Secondary | ICD-10-CM | POA: Diagnosis not present

## 2016-04-03 DIAGNOSIS — E669 Obesity, unspecified: Secondary | ICD-10-CM | POA: Diagnosis present

## 2016-04-03 DIAGNOSIS — Z87891 Personal history of nicotine dependence: Secondary | ICD-10-CM | POA: Diagnosis not present

## 2016-04-03 DIAGNOSIS — R531 Weakness: Secondary | ICD-10-CM | POA: Diagnosis not present

## 2016-04-03 DIAGNOSIS — Z9841 Cataract extraction status, right eye: Secondary | ICD-10-CM | POA: Diagnosis not present

## 2016-04-03 DIAGNOSIS — I6389 Other cerebral infarction: Secondary | ICD-10-CM

## 2016-04-03 DIAGNOSIS — Z8249 Family history of ischemic heart disease and other diseases of the circulatory system: Secondary | ICD-10-CM

## 2016-04-03 DIAGNOSIS — G459 Transient cerebral ischemic attack, unspecified: Principal | ICD-10-CM | POA: Diagnosis present

## 2016-04-03 DIAGNOSIS — Z9079 Acquired absence of other genital organ(s): Secondary | ICD-10-CM | POA: Diagnosis not present

## 2016-04-03 DIAGNOSIS — I672 Cerebral atherosclerosis: Secondary | ICD-10-CM | POA: Diagnosis present

## 2016-04-03 DIAGNOSIS — Z923 Personal history of irradiation: Secondary | ICD-10-CM | POA: Diagnosis not present

## 2016-04-03 DIAGNOSIS — G8191 Hemiplegia, unspecified affecting right dominant side: Secondary | ICD-10-CM | POA: Diagnosis not present

## 2016-04-03 DIAGNOSIS — R404 Transient alteration of awareness: Secondary | ICD-10-CM | POA: Diagnosis not present

## 2016-04-03 DIAGNOSIS — K627 Radiation proctitis: Secondary | ICD-10-CM | POA: Diagnosis present

## 2016-04-03 DIAGNOSIS — C61 Malignant neoplasm of prostate: Secondary | ICD-10-CM | POA: Diagnosis not present

## 2016-04-03 DIAGNOSIS — G451 Carotid artery syndrome (hemispheric): Secondary | ICD-10-CM

## 2016-04-03 DIAGNOSIS — R4701 Aphasia: Secondary | ICD-10-CM | POA: Diagnosis not present

## 2016-04-03 DIAGNOSIS — Z91018 Allergy to other foods: Secondary | ICD-10-CM

## 2016-04-03 DIAGNOSIS — Z820 Family history of epilepsy and other diseases of the nervous system: Secondary | ICD-10-CM

## 2016-04-03 DIAGNOSIS — K5790 Diverticulosis of intestine, part unspecified, without perforation or abscess without bleeding: Secondary | ICD-10-CM | POA: Diagnosis present

## 2016-04-03 DIAGNOSIS — Z841 Family history of disorders of kidney and ureter: Secondary | ICD-10-CM

## 2016-04-03 DIAGNOSIS — I638 Other cerebral infarction: Secondary | ICD-10-CM | POA: Diagnosis not present

## 2016-04-03 HISTORY — DX: Unspecified urinary incontinence: R32

## 2016-04-03 LAB — I-STAT TROPONIN, ED: Troponin i, poc: 0.01 ng/mL (ref 0.00–0.08)

## 2016-04-03 LAB — CBC
HCT: 45.7 % (ref 39.0–52.0)
Hemoglobin: 16 g/dL (ref 13.0–17.0)
MCH: 33 pg (ref 26.0–34.0)
MCHC: 35 g/dL (ref 30.0–36.0)
MCV: 94.2 fL (ref 78.0–100.0)
PLATELETS: 230 10*3/uL (ref 150–400)
RBC: 4.85 MIL/uL (ref 4.22–5.81)
RDW: 13.1 % (ref 11.5–15.5)
WBC: 6.9 10*3/uL (ref 4.0–10.5)

## 2016-04-03 LAB — COMPREHENSIVE METABOLIC PANEL
ALBUMIN: 4.3 g/dL (ref 3.5–5.0)
ALK PHOS: 58 U/L (ref 38–126)
ALT: 12 U/L — ABNORMAL LOW (ref 17–63)
ANION GAP: 8 (ref 5–15)
AST: 19 U/L (ref 15–41)
BUN: 18 mg/dL (ref 6–20)
CALCIUM: 9.9 mg/dL (ref 8.9–10.3)
CO2: 28 mmol/L (ref 22–32)
Chloride: 103 mmol/L (ref 101–111)
Creatinine, Ser: 1.05 mg/dL (ref 0.61–1.24)
GFR calc non Af Amer: >60 mL/min (ref >60)
GLUCOSE: 103 mg/dL — AB (ref 65–99)
POTASSIUM: 4.4 mmol/L (ref 3.5–5.1)
SODIUM: 139 mmol/L (ref 135–145)
TOTAL PROTEIN: 7.3 g/dL (ref 6.5–8.1)
Total Bilirubin: 0.5 mg/dL (ref 0.3–1.2)

## 2016-04-03 LAB — I-STAT CHEM 8, ED
BUN: 19 mg/dL (ref 6–20)
Calcium, Ion: 1.2 mmol/L (ref 1.15–1.40)
Chloride: 103 mmol/L (ref 101–111)
Creatinine, Ser: 1.1 mg/dL (ref 0.61–1.24)
GLUCOSE: 100 mg/dL — AB (ref 65–99)
HCT: 42 % (ref 39.0–52.0)
HEMOGLOBIN: 14.3 g/dL (ref 13.0–17.0)
POTASSIUM: 4.7 mmol/L (ref 3.5–5.1)
SODIUM: 138 mmol/L (ref 135–145)
TCO2: 28 mmol/L (ref 0–100)

## 2016-04-03 LAB — APTT: aPTT: 28 seconds (ref 24–36)

## 2016-04-03 LAB — DIFFERENTIAL
Basophils Absolute: 0 10*3/uL (ref 0.0–0.1)
Basophils Relative: 0 %
EOS ABS: 0.2 10*3/uL (ref 0.0–0.7)
EOS PCT: 3 %
LYMPHS PCT: 31 %
Lymphs Abs: 2.1 10*3/uL (ref 0.7–4.0)
MONO ABS: 0.7 10*3/uL (ref 0.1–1.0)
Monocytes Relative: 9 %
NEUTROS PCT: 57 %
Neutro Abs: 4 10*3/uL (ref 1.7–7.7)

## 2016-04-03 LAB — CBG MONITORING, ED: GLUCOSE-CAPILLARY: 88 mg/dL (ref 65–99)

## 2016-04-03 LAB — PROTIME-INR
INR: 1.01
PROTHROMBIN TIME: 13.3 s (ref 11.4–15.2)

## 2016-04-03 MED ORDER — IOPAMIDOL (ISOVUE-370) INJECTION 76%
75.0000 mL | Freq: Once | INTRAVENOUS | Status: AC | PRN
  Administered 2016-04-03: 75 mL via INTRAVENOUS

## 2016-04-03 MED ORDER — ASPIRIN 81 MG PO CHEW
324.0000 mg | CHEWABLE_TABLET | Freq: Once | ORAL | Status: AC
  Administered 2016-04-03: 324 mg via ORAL

## 2016-04-03 MED ORDER — ASPIRIN 81 MG PO CHEW
CHEWABLE_TABLET | ORAL | Status: AC
  Filled 2016-04-03: qty 4

## 2016-04-03 MED ORDER — ATORVASTATIN CALCIUM 40 MG PO TABS
40.0000 mg | ORAL_TABLET | Freq: Every day | ORAL | Status: DC
  Administered 2016-04-03 – 2016-04-04 (×2): 40 mg via ORAL
  Filled 2016-04-03 (×4): qty 1

## 2016-04-03 MED ORDER — SODIUM CHLORIDE 0.9 % IV SOLN
INTRAVENOUS | Status: DC
  Administered 2016-04-03: 14:00:00 via INTRAVENOUS

## 2016-04-03 NOTE — ED Notes (Signed)
EDP at bedside. Lab at bedside obtaining lab specimen.

## 2016-04-03 NOTE — ED Notes (Signed)
Report called to Sharyn Lull, [place].

## 2016-04-03 NOTE — ED Triage Notes (Addendum)
Per EMS, pt reports family brought pt in to PCP for expressive aphasia, RLE weakness. At time of arrival to pcp office, symptoms resolved. Pt denies any symptoms at this time. Pt denies headache, unilateral weakness, speech clear, alert and oriented x4. nad noted. EMS reports symptoms last several minutes and report x2 episodes prior to there arrival at pcp office. cbg 115 en route.   Pt reports had aneurysm behind right eye x6 months ago. cbg en ED 88.

## 2016-04-03 NOTE — ED Notes (Signed)
AC called to bring medication.

## 2016-04-03 NOTE — ED Notes (Addendum)
Call pt's son with pt is being transferred.  [name] [phone]

## 2016-04-03 NOTE — ED Provider Notes (Addendum)
[facility] DEPT Provider Note   CSN: [id number] Arrival date & time: 04/03/16  1238     History   Chief Complaint Chief Complaint  Patient presents with  . Aphasia    HPI NICKOLIS DIEL is a 83 y.o. male.  Patient sent in from [facility] for concerns for stroke or recurrent TIAs. Patient at 9:00 this morning as witnessed by family developed right leg weakness and had speech problems. Speech problems waxed and waned. Apparently resolved and then reoccurred again. Now has resolved again. Patient without any specific complaints. Patient without any known history of strokes in the past. Patient was told something about an aneurysm behind the right eye sometime in the past few weeks. Patient denies any headaches.      Past Medical History:  Diagnosis Date  . Arthritis   . BNC (bladder neck contracture)   . BPPV (benign paroxysmal positional vertigo)   . Cancer [facility])    prostate  . Cataract    right  . Diverticulosis   . Foley catheter in place   . Foley catheter in place   . H/O diarrhea SECONDARY TO RADIATION THERAPY --  INTERMITANT  DIARRHEA  . Hematuria   . History of hemorrhagic cystitis    SECONDARY RADIATION THERAPY 1997  . History of prostate cancer CURRENTLY  ELEVATED PSA--  LUPRON INJECTIONS   S/P PROSTATECTOMY AND RADIATION THERAPY  1997  . Hyperlipidemia   . Hypertension   . Hypothyroidism   . Radiation cystitis   . Radiation proctitis   . Urinary retention   . Vitamin D deficiency     Patient Active Problem List   Diagnosis Date Noted  . Metabolic syndrome 60/63/0160  . Osteopenia of neck of left femur 01/15/2016  . H/O prostate cancer 06/27/2015  . BMI 34.0-34.9,adult 12/27/2014  . Hypertension 07/04/2012  . Hyperlipidemia 07/04/2012  . Hypothyroidism 07/04/2012  . Bladder neck contracture 06/22/2012  . Radiation proctitis 08/11/2010  . Anal stenosis 08/11/2010  . Personal history of colonic polyps 08/11/2010     Past Surgical History:  Procedure Laterality Date  . CATARACT EXTRACTION W/ INTRAOCULAR LENS IMPLANT Right   . COLONOSCOPY W/ POLYPECTOMY  2005; 08/11/2010   2005: 1 cm hyperplastic sigmoid polyp, Dr. Wynetta Emery 2012: hyperplastic splenic flexure polyp -1 cm, diverticulosis, radiation proctitis, anal stenosis  . CYSTO/ FULGERATION OF BLEEDERS/ RESECTION BLADDER NECK CONTRACTURE  07-06-2004   BNC AND RADIATION CYSTITIS  . EYE SURGERY    . KNEE ARTHROSCOPY Right   . LUMBAR DISC SURGERY  1996   L4 -- L5  . PROSTATECTOMY  1997  . SPINE SURGERY    . TRANSURETHRAL RESECTION OF BLADDER NECK N/A 06/23/2012   Procedure: TRANSURETHRAL RESECTION OF BLADDER NECK CONTRACTURE WITH GYRUS;  Surgeon: Claybon Jabs, MD;  Location: [facility];  Service: Urology;  Laterality: N/A;  . TRANSURETHRAL RESECTION OF BLADDER TUMOR WITH GYRUS (TURBT-GYRUS) N/A 06/01/2013   Procedure: TRANSURETHRAL RESECTION OF BLADDER NECK CONTRACTURE WITH GYRUS (TURBT-GYRUS) AND INJECTION OF KENALOG;  Surgeon: Claybon Jabs, MD;  Location: [place];  Service: Urology;  Laterality: N/A;       Home Medications    Prior to Admission medications   Medication Sig Start Date End Date Taking? Authorizing Provider  amLODipine (NORVASC) 10 MG tablet Take 1 tablet (10 mg total) by mouth daily. 03/30/16  Yes Mary-Margaret Hassell Done, FNP  Calcium Carb-Cholecalciferol (CALCIUM 600 + D PO) Take 1 tablet by mouth daily  after lunch.    Yes Historical Provider, MD  Cholecalciferol (VITAMIN D3) 5000 UNITS CAPS Take 1 capsule by mouth daily.   Yes Historical Provider, MD  Coenzyme Q10-Red Yeast Rice 60-600 MG CAPS Take 2 capsules by mouth daily. THIS PILL ALSO HAS '100MG'$  NIACIN   Yes Historical Provider, MD  diphenhydrAMINE (BENADRYL) 25 MG tablet Take 25 mg by mouth every 6 (six) hours as needed for itching.   Yes Historical Provider, MD  enzalutamide Gillermina Phy) 40 MG capsule Take 160 mg by mouth daily.   Yes Historical  Provider, MD  FLAXSEED, LINSEED, PO Take 30 mLs by mouth every morning. GRINDS FLAX SEEDS AND PUTS IN 1/4 CUP OF WATER   Yes Historical Provider, MD  ibuprofen (ADVIL,MOTRIN) 200 MG tablet Take 400 mg by mouth every 6 (six) hours as needed for moderate pain.   Yes Historical Provider, MD  Leuprolide Acetate, 6 Month, (LUPRON DEPOT) 45 MG injection Inject 45 mg into the muscle every 6 (six) months.    Yes Historical Provider, MD  Levothyroxine Sodium 88 MCG CAPS Take 1 capsule (88 mcg total) by mouth daily before breakfast. 04/01/16  Yes Mary-Margaret Hassell Done, FNP  Magnesium 300 MG CAPS Take 1 capsule by mouth daily.   Yes Historical Provider, MD  Probiotic Product (PROBIOTIC DAILY PO) Take 1 capsule by mouth daily.   Yes Historical Provider, MD    Family History Family History  Problem Relation Age of Onset  . Prostate cancer Brother   . Kidney disease Brother   . Congestive Heart Failure Mother   . Pancreatitis Father   . Pneumonia Father   . Cancer Sister     breast  . Obesity Sister   . Early death Sister     appendicitis  . Cancer Sister     breast  . Alzheimer's disease Brother   . Hip fracture Brother     Social History Social History  Substance Use Topics  . Smoking status: Former Smoker    Years: 20.00    Types: Cigarettes    Quit date: 07/28/1979  . Smokeless tobacco: Former Systems developer    Types: [place] date: 07/28/1979     Comment: SMOKED AND CHEW TOBACCO FOR 20 YRS QUIT AGE 31  . Alcohol use No     Allergies   Caffeine and Codeine   Review of Systems Review of Systems  Constitutional: Negative for fever.  HENT: Negative for congestion.   Eyes: Negative for visual disturbance.  Respiratory: Negative for shortness of breath.   Cardiovascular: Negative for chest pain.  Gastrointestinal: Negative for abdominal pain.  Genitourinary: Negative for dysuria.  Musculoskeletal: Negative for back pain.  Skin: Negative for rash.  Neurological: Positive for speech  difficulty and weakness. Negative for syncope and headaches.  Hematological: Does not bruise/bleed easily.  Psychiatric/Behavioral: Negative for confusion.     Physical Exam Updated Vital Signs BP 181/76   Pulse 83   Temp 98.1 F (36.7 C) (Oral)   Resp 13   Ht '6\' 1"'$  (1.854 m)   Wt 112 kg   SpO2 96%   BMI 32.59 kg/m   Physical Exam  Constitutional: He is oriented to person, place, and time. He appears well-developed and well-nourished. No distress.  HENT:  Head: Normocephalic and atraumatic.  Mouth/Throat: Oropharynx is clear and moist.  Eyes: Conjunctivae and EOM are normal. Pupils are equal, round, and reactive to light.  Neck: Normal range of motion. Neck supple.  Cardiovascular: Normal rate, regular  rhythm and normal heart sounds.   Pulmonary/Chest: Effort normal and breath sounds normal. No respiratory distress.  Abdominal: Soft. Bowel sounds are normal. There is no tenderness.  Musculoskeletal: Normal range of motion. He exhibits no edema.  Neurological: He is alert and oriented to person, place, and time. No cranial nerve deficit or sensory deficit. He exhibits normal muscle tone. Coordination normal.  Skin: Skin is warm.  Nursing note and vitals reviewed.    ED Treatments / Results  Labs (all labs ordered are listed, but only abnormal results are displayed) Labs Reviewed  COMPREHENSIVE METABOLIC PANEL - Abnormal; Notable for the following:       Result Value   Glucose, Bld 103 (*)    ALT 12 (*)    All other components within normal limits  I-STAT CHEM 8, ED - Abnormal; Notable for the following:    Glucose, Bld 100 (*)    All other components within normal limits  PROTIME-INR  APTT  CBC  DIFFERENTIAL  I-STAT TROPOININ, ED  CBG MONITORING, ED    EKG  EKG Interpretation  Date/Time:  Saturday April 03 2016 12:45:40 EST Ventricular Rate:  80 PR Interval:    QRS Duration: 96 QT Interval:  387 QTC Calculation: 447 R Axis:   58 Text Interpretation:   Sinus rhythm Ventricular premature complex Borderline prolonged PR interval Nonspecific T abnormalities, lateral leads No significant change since Confirmed by CAMPOS  MD, KEVIN ([id number]) on 04/03/2016 12:57:07 PM       Radiology Ct Angio Head W/cm &/or Wo Cm  Result Date: 04/03/2016 CLINICAL DATA:  Acute onset of expressive aphasia and right lower extremity weakness which subsequently resolved. Cerebral aneurysm. EXAM: CT ANGIOGRAPHY HEAD TECHNIQUE: Multidetector CT imaging of the head was performed using the standard protocol during bolus administration of intravenous contrast. Multiplanar CT image reconstructions and MIPs were obtained to evaluate the vascular anatomy. CONTRAST:  75 mL Isovue 370 COMPARISON:  None. FINDINGS: CT HEAD Brain: Noncontrast imaging of the brain demonstrates mild generalized atrophy and white matter disease. The there are remote lacunar infarcts involving the basal ganglia bilaterally, more prominent on the right. The brainstem is normal. Remote lacunar infarcts are present in the inferior cerebellum bilaterally. No acute cortical infarct is present. There is no acute hemorrhage or mass lesion. The ventricles are of normal size. No significant extra-axial fluid collection is present. Vascular: Atherosclerotic calcifications are present within the cavernous internal carotid arteries and at the dural margin of the left vertebral artery. Skull: Calvarium is intact. No focal lytic or blastic lesions are present. Sinuses: Posterior ethmoid air cells are opacified on the right. The remaining paranasal sinuses cannot mastoid air cells are clear. Orbits: The patient is status post bilateral lens replacements. The globes and orbits are otherwise within normal limits. CTA HEAD Anterior circulation: Atherosclerotic calcifications are present in the cavernous internal carotid arteries bilaterally without significant stenosis. There is moderate tortuosity of the cervical internal carotid  artery's is below the skullbase. There is no focal stenosis or aneurysm through the ICA termini bilaterally. There is a moderate stenosis of the proximal right A1 segment. The left A1 segment is normal. The anterior communicating artery is patent. There is moderate segmental narrowing throughout the ACA branch vessels bilaterally, worse on the right. Mild narrowing is present in the proximal superior right M2 branch, just beyond the bifurcation. There is some attenuation of distal MCA branch vessels bilaterally. Posterior circulation: The left vertebral artery is slightly dominant to the right. The PICA  origins are visualized and normal. The vertebrobasilar junction is normal. The basilar artery is within normal limits. Right posterior cerebral artery originates from the basilar tip. The left posterior cerebral artery is of fetal type. There is some attenuation of PCA branch vessels bilaterally. Venous sinuses: The dural sinuses are patent. The right transverse sinus is dominant. The straight sinus and deep cerebral veins are intact. Cortical veins are unremarkable. Anatomic variants: Fetal type left posterior cerebral artery. Delayed phase: The postcontrast images exaggerate the basal ganglia infarcts. There is no pathologic enhancement. IMPRESSION: 1. Atherosclerotic calcifications within the cavernous internal carotid arteries bilaterally without significant stenosis. 2. No focal aneurysm. 3. Moderate narrowing of the proximal right A1 segment and proximal superior right M2 segment. 4. Mild diffuse the medium and distal small vessel disease. 5. Segmental disease is more significant in the right than left anterior cerebral artery. 6. Tortuosity of the high cervical internal carotid arteries bilaterally is likely related to chronic hypertension. No significant extracranial stenosis is evident. Electronically Signed   By: San Morelle M.D.   On: 04/03/2016 14:59    Procedures Procedures (including  critical care time)  CRITICAL CARE Performed by: Fredia Sorrow Total critical care time: 30 minutes Critical care time was exclusive of separately billable procedures and treating other patients. Critical care was necessary to treat or prevent imminent or life-threatening deterioration. Critical care was time spent personally by me on the following activities: development of treatment plan with patient and/or surrogate as well as nursing, discussions with consultants, evaluation of patient's response to treatment, examination of patient, obtaining history from patient or surrogate, ordering and performing treatments and interventions, ordering and review of laboratory studies, ordering and review of radiographic studies, pulse oximetry and re-evaluation of patient's condition.   Medications Ordered in ED Medications  0.9 %  sodium chloride infusion ( Intravenous New Bag/Given 04/03/16 1344)  iopamidol (ISOVUE-370) 76 % injection 75 mL (75 mLs Intravenous Contrast Given 04/03/16 1407)     Initial Impression / Assessment and Plan / ED Course  I have reviewed the triage vital signs and the nursing notes.  Pertinent labs & imaging results that were available during my care of the patient were reviewed by me and considered in my medical decision making (see chart for details).   patient sent in from primary care office in [facility] for acute onset of a aphasia and right leg weakness. Apparently onset was around 9 in the morning. Then resolved and then there was a recurrent episode reportedly in the doctor's office. Patient has remained asymptomatic since arrival here. CT head and CT angios without any acute findings. Patient clearly with TIA type symptoms. Admission required. We'll discuss with hospitalist whether they wanted to admission here or have patient transferred to the hospitalist service at [facility] where there is a neuro hospitalist.  CT of the head does show evidence of remote  infarcts. Nothing acute.  Discussed with the hospitalist they are planning to transfer patient to [facility] where there is a neuro hospitalist.  Final Clinical Impressions(s) / ED Diagnoses   Final diagnoses:  Other specified transient cerebral ischemias    New Prescriptions New Prescriptions   No medications on file     Fredia Sorrow, MD 04/03/16 [place], MD 04/03/16 1545

## 2016-04-03 NOTE — ED Notes (Signed)
Report given to [facility]. 

## 2016-04-03 NOTE — ED Notes (Signed)
[facility] contacted at this time for transport. Aretha Levi

## 2016-04-03 NOTE — ED Notes (Signed)
MD Lorin Mercy at bedside.

## 2016-04-03 NOTE — H&P (Signed)
History and Physical    [name]:[id number] DOB: 07-21-1932 DOA: 04/03/2016  PCP: Chevis Pretty, FNP Consultants:  Zigmund Daniel - retina; [facility]; [facility] - orthopedics Patient coming from: home - lives with wife; NOK: wife, [phone]  Chief Complaint:  TIA  HPI: [name] is a 83 y.o. male with medical history significant of prostate cancer with radiation proctitis, cystitis; hypothyroidism; HTN; and HLD presenting because "they (the family) seem to think I was having a mini-stroke".  This Am after breakfast, maybe 830, he first had symptoms.  They were in the car and his wife was trying to talk to him and he couldn't answer her.  They arrived at their destination and he was unable to move right leg.  Arm was okay at that time.  He was unable to get out of the car and unable to speak and this persisted until he was at his daughter's house later.  Sat for a while and eventually got out and was able to walk.  Then they left and went to his daughter's house about 930-945 and he couldn't get out again and both right arm and leg were weak, unable to grip, little strength; they eventually helped him out and into her house and he didn't get any better after that.  Daughter reports that he had to lift his right leg with his hand, but his right arm was also weak.  He reported a concern that his mind was not telling his body what to do.  Difficulty moving right foot over the curb.  Required 2-person assist to heklp him stand.  He could barely move his right leg with instruction, daughter lifted it and it felt like dead weight.  Unable to lift arm.  He could understand everything they were saying but he could not speak.  He did laugh.  Eventually he finally was able to talk again.  He was very coherent.  Then he again developed aphasia at least 2 more times.  Took BP there and it was high, 180/60 (138/72 earlier in the week).  They convinced him to go to the doctor and he had ongoing  intermittent speech difficulties while driving to the doctor.  They arrived at [facility], where the nurse checked his BP in the lobby and they sent him to ER by EMS.     Occasional chronic intermittent dizziness, none today.  No headache.    Was told he had an aneurysm in the back of his eye about maybe 3-6 months ago and it was bleeding.  They thought it might be related to an aneurysm.  He was taken off ASA at that time.  He saw the eye doctor again in February and it was getting better.   He will f/u in 3 months.    ED Course:  CT and CTA without acute findings but remote infarcts. Plan for transfer to [facility].  Review of Systems: As per HPI; otherwise 10 point review of systems reviewed and negative.   Ambulatory Status:  Ambulates with a cane  Past Medical History:  Diagnosis Date  . Arthritis   . BNC (bladder neck contracture)   . BPPV (benign paroxysmal positional vertigo)   . Cataract    right  . Diverticulosis   . H/O diarrhea SECONDARY TO RADIATION THERAPY --  INTERMITANT  DIARRHEA  . Hematuria   . History of hemorrhagic cystitis    SECONDARY RADIATION THERAPY 1997  . History of prostate cancer CURRENTLY  ELEVATED PSA--  LUPRON INJECTIONS   S/P PROSTATECTOMY AND RADIATION THERAPY  1997  . Hyperlipidemia   . Hypertension   . Hypothyroidism   . Radiation cystitis   . Radiation proctitis   . Urinary incontinence   . Urinary retention   . Vitamin D deficiency     Past Surgical History:  Procedure Laterality Date  . CATARACT EXTRACTION W/ INTRAOCULAR LENS IMPLANT Right   . COLONOSCOPY W/ POLYPECTOMY  2005; 08/11/2010   2005: 1 cm hyperplastic sigmoid polyp, Dr. Wynetta Emery 2012: hyperplastic splenic flexure polyp -1 cm, diverticulosis, radiation proctitis, anal stenosis  . CYSTO/ FULGERATION OF BLEEDERS/ RESECTION BLADDER NECK CONTRACTURE  07-06-2004   BNC AND RADIATION CYSTITIS  . EYE SURGERY    . KNEE ARTHROSCOPY Right   . LUMBAR DISC SURGERY  1996   L4 -- L5  . PROSTATECTOMY   1997  . SPINE SURGERY    . TRANSURETHRAL RESECTION OF BLADDER NECK N/A 06/23/2012   Procedure: TRANSURETHRAL RESECTION OF BLADDER NECK CONTRACTURE WITH GYRUS;  Surgeon: Claybon Jabs, MD;  Location: [facility];  Service: Urology;  Laterality: N/A;  . TRANSURETHRAL RESECTION OF BLADDER TUMOR WITH GYRUS (TURBT-GYRUS) N/A 06/01/2013   Procedure: TRANSURETHRAL RESECTION OF BLADDER NECK CONTRACTURE WITH GYRUS (TURBT-GYRUS) AND INJECTION OF KENALOG;  Surgeon: Claybon Jabs, MD;  Location: [facility];  Service: Urology;  Laterality: N/A;    Social History   Social History  . Marital status: Married    Spouse name: N/A  . Number of children: N/A  . Years of education: N/A   Occupational History  . Not on file.   Social History Main Topics  . Smoking status: Former Smoker    Years: 20.00    Types: Cigarettes    Quit date: 1965  . Smokeless tobacco: Former Systems developer    Types: [place] date: 1965     Comment: SMOKED AND CHEW TOBACCO FOR 20 YRS QUIT AGE 63  . Alcohol use No  . Drug use: No  . Sexual activity: No   Other Topics Concern  . Not on file   Social History Narrative  . No narrative on file    Allergies  Allergen Reactions  . Caffeine Palpitations  . Codeine Palpitations    Family History  Problem Relation Age of Onset  . Prostate cancer Brother   . Kidney disease Brother   . Congestive Heart Failure Mother   . CVA Mother 82  . Pancreatitis Father   . Pneumonia Father   . Cancer Sister     breast  . Obesity Sister   . Early death Sister     appendicitis  . Cancer Sister     breast  . Alzheimer's disease Brother   . Hip fracture Brother     Prior to Admission medications   Medication Sig Start Date End Date Taking? Authorizing Provider  amLODipine (NORVASC) 10 MG tablet Take 1 tablet (10 mg total) by mouth daily. 03/30/16  Yes Mary-Margaret Hassell Done, FNP  Calcium Carb-Cholecalciferol (CALCIUM 600 + D PO) Take 1 tablet by mouth  daily after lunch.    Yes Historical Provider, MD  Cholecalciferol (VITAMIN D3) 5000 UNITS CAPS Take 1 capsule by mouth daily.   Yes Historical Provider, MD  Coenzyme Q10-Red Yeast Rice 60-600 MG CAPS Take 2 capsules by mouth daily. THIS PILL ALSO HAS '100MG'$  NIACIN   Yes Historical Provider, MD  diphenhydrAMINE (BENADRYL) 25 MG tablet Take 25 mg by mouth every 6 (  six) hours as needed for itching.   Yes Historical Provider, MD  enzalutamide Gillermina Phy) 40 MG capsule Take 160 mg by mouth daily.   Yes Historical Provider, MD  FLAXSEED, LINSEED, PO Take 30 mLs by mouth every morning. GRINDS FLAX SEEDS AND PUTS IN 1/4 CUP OF WATER   Yes Historical Provider, MD  ibuprofen (ADVIL,MOTRIN) 200 MG tablet Take 400 mg by mouth every 6 (six) hours as needed for moderate pain.   Yes Historical Provider, MD  Leuprolide Acetate, 6 Month, (LUPRON DEPOT) 45 MG injection Inject 45 mg into the muscle every 6 (six) months.    Yes Historical Provider, MD  Levothyroxine Sodium 88 MCG CAPS Take 1 capsule (88 mcg total) by mouth daily before breakfast. 04/01/16  Yes Mary-Margaret Hassell Done, FNP  Magnesium 300 MG CAPS Take 1 capsule by mouth daily.   Yes Historical Provider, MD  Probiotic Product (PROBIOTIC DAILY PO) Take 1 capsule by mouth daily.   Yes Historical Provider, MD    Physical Exam: Vitals:   04/03/16 1800 04/03/16 1830 04/03/16 1930 04/03/16 2000  BP: 161/76 189/72 147/72 166/74  Pulse: 75 72 67 65  Resp: '13 17 19 17  '$ Temp:      TempSrc:      SpO2: 97% 95% 95% 94%  Weight:      Height:         General:  Appears calm and comfortable and is NAD Eyes:  PERRL, EOMI, normal lids, iris ENT:  grossly normal hearing, lips & tongue, mmm Neck:  no LAD, masses or thyromegaly Cardiovascular:  RRR, no m/r/g. No LE edema.  Respiratory:  CTA bilaterally, no w/r/r. Normal respiratory effort. Abdomen:  soft, ntnd, NABS Skin:  no rash or induration seen on limited exam Musculoskeletal:  grossly normal tone BUE/BLE, good  ROM, no bony abnormality Psychiatric:  grossly normal mood and affect, speech fluent and appropriate, AOx3 Neurologic:  CN 2-12 grossly intact, moves all extremities in coordinated fashion, sensation intact.  Possible right LE weakness, but this appears to be related to his chronic knee issues rather than to today's events.  Labs on Admission: I have personally reviewed following labs and imaging studies  CBC:  Recent Labs Lab 04/03/16 1249 04/03/16 1310  WBC 6.9  --   NEUTROABS 4.0  --   HGB 16.0 14.3  HCT 45.7 42.0  MCV 94.2  --   PLT 230  --    Basic Metabolic Panel:  Recent Labs Lab 03/30/16 1044 04/03/16 1249 04/03/16 1310  NA 138 139 138  K 5.1 4.4 4.7  CL 98 103 103  CO2 25 28  --   GLUCOSE 100* 103* 100*  BUN '12 18 19  '$ CREATININE 0.87 1.05 1.10  CALCIUM 9.9 9.9  --    GFR: Estimated Creatinine Clearance: 69.1 mL/min (by C-G formula based on SCr of 1.1 mg/dL). Liver Function Tests:  Recent Labs Lab 03/30/16 1044 04/03/16 1249  AST 15 19  ALT 10 12*  ALKPHOS 55 58  BILITOT 0.5 0.5  PROT 6.8 7.3  ALBUMIN 4.3 4.3   No results for input(s): LIPASE, AMYLASE in the last 168 hours. No results for input(s): AMMONIA in the last 168 hours. Coagulation Profile:  Recent Labs Lab 04/03/16 1249  INR 1.01   Cardiac Enzymes: No results for input(s): CKTOTAL, CKMB, CKMBINDEX, TROPONINI in the last 168 hours. BNP (last 3 results) No results for input(s): PROBNP in the last 8760 hours. HbA1C: No results for input(s): HGBA1C in the last  72 hours. CBG:  Recent Labs Lab 04/03/16 1244  GLUCAP 88   Lipid Profile: No results for input(s): CHOL, HDL, LDLCALC, TRIG, CHOLHDL, LDLDIRECT in the last 72 hours. Thyroid Function Tests: No results for input(s): TSH, T4TOTAL, FREET4, T3FREE, THYROIDAB in the last 72 hours. Anemia Panel: No results for input(s): VITAMINB12, FOLATE, FERRITIN, TIBC, IRON, RETICCTPCT in the last 72 hours. Urine analysis:    Component  Value Date/Time   COLORURINE BROWN (A) 05/16/2013 1917   APPEARANCEUR HAZY (A) 05/16/2013 1917   LABSPEC 1.020 05/16/2013 1917   PHURINE 6.5 05/16/2013 1917   GLUCOSEU NEGATIVE 05/16/2013 1917   HGBUR LARGE (A) 05/16/2013 1917   BILIRUBINUR SMALL (A) 05/16/2013 1917   KETONESUR TRACE (A) 05/16/2013 1917   PROTEINUR >300 (A) 05/16/2013 1917   UROBILINOGEN 1.0 05/16/2013 1917   NITRITE POSITIVE (A) 05/16/2013 1917   LEUKOCYTESUR TRACE (A) 05/16/2013 1917    Creatinine Clearance: Estimated Creatinine Clearance: 69.1 mL/min (by C-G formula based on SCr of 1.1 mg/dL).  Sepsis Labs: '@LABRCNTIP'$ (procalcitonin:4,lacticidven:4) )No results found for this or any previous visit (from the past 240 hour(s)).   Radiological Exams on Admission: Ct Angio Head W/cm &/or Wo Cm  Result Date: 04/03/2016 CLINICAL DATA:  Acute onset of expressive aphasia and right lower extremity weakness which subsequently resolved. Cerebral aneurysm. EXAM: CT ANGIOGRAPHY HEAD TECHNIQUE: Multidetector CT imaging of the head was performed using the standard protocol during bolus administration of intravenous contrast. Multiplanar CT image reconstructions and MIPs were obtained to evaluate the vascular anatomy. CONTRAST:  75 mL Isovue 370 COMPARISON:  None. FINDINGS: CT HEAD Brain: Noncontrast imaging of the brain demonstrates mild generalized atrophy and white matter disease. The there are remote lacunar infarcts involving the basal ganglia bilaterally, more prominent on the right. The brainstem is normal. Remote lacunar infarcts are present in the inferior cerebellum bilaterally. No acute cortical infarct is present. There is no acute hemorrhage or mass lesion. The ventricles are of normal size. No significant extra-axial fluid collection is present. Vascular: Atherosclerotic calcifications are present within the cavernous internal carotid arteries and at the dural margin of the left vertebral artery. Skull: Calvarium is intact.  No focal lytic or blastic lesions are present. Sinuses: Posterior ethmoid air cells are opacified on the right. The remaining paranasal sinuses cannot mastoid air cells are clear. Orbits: The patient is status post bilateral lens replacements. The globes and orbits are otherwise within normal limits. CTA HEAD Anterior circulation: Atherosclerotic calcifications are present in the cavernous internal carotid arteries bilaterally without significant stenosis. There is moderate tortuosity of the cervical internal carotid artery's is below the skullbase. There is no focal stenosis or aneurysm through the ICA termini bilaterally. There is a moderate stenosis of the proximal right A1 segment. The left A1 segment is normal. The anterior communicating artery is patent. There is moderate segmental narrowing throughout the ACA branch vessels bilaterally, worse on the right. Mild narrowing is present in the proximal superior right M2 branch, just beyond the bifurcation. There is some attenuation of distal MCA branch vessels bilaterally. Posterior circulation: The left vertebral artery is slightly dominant to the right. The PICA origins are visualized and normal. The vertebrobasilar junction is normal. The basilar artery is within normal limits. Right posterior cerebral artery originates from the basilar tip. The left posterior cerebral artery is of fetal type. There is some attenuation of PCA branch vessels bilaterally. Venous sinuses: The dural sinuses are patent. The right transverse sinus is dominant. The straight sinus and deep  cerebral veins are intact. Cortical veins are unremarkable. Anatomic variants: Fetal type left posterior cerebral artery. Delayed phase: The postcontrast images exaggerate the basal ganglia infarcts. There is no pathologic enhancement. IMPRESSION: 1. Atherosclerotic calcifications within the cavernous internal carotid arteries bilaterally without significant stenosis. 2. No focal aneurysm. 3.  Moderate narrowing of the proximal right A1 segment and proximal superior right M2 segment. 4. Mild diffuse the medium and distal small vessel disease. 5. Segmental disease is more significant in the right than left anterior cerebral artery. 6. Tortuosity of the high cervical internal carotid arteries bilaterally is likely related to chronic hypertension. No significant extracranial stenosis is evident. Electronically Signed   By: San Morelle M.D.   On: 04/03/2016 14:59    EKG: Independently reviewed.  NSR with rate 80; nonspecific ST changes with no evidence of acute ischemia  Assessment/Plan Principal Problem:   TIA (transient ischemic attack) Active Problems:   Hypertension   Hyperlipidemia   Hypothyroidism   H/O prostate cancer   TIA -Symptoms of intermittent RUE/RLE weakness and expressive aphasia -Concerning for TIA/CVA -Will place in observation status for CVA/TIA evaluation -Will need to transfer to [facility] for neurology consult (spoke with Dr. Shon Hale, who agrees) and expedited work-up -Telemetry monitoring -MRI/MRA -Carotid dopplers -Echo -Risk stratification with A1c given elevated very mildly elevated glucose on admission (103) -ASA daily (has not been taking so no primary ASA failure) -PT/OT/ST/Nutrition Consults  HTN -Allow permissive HTN -Treat BP only if >220/120, and then with goal of 15% reduction -Hold CCB and plan to restart in 48-72 hours  HLD -Recent lipid panel, 03/30/16: TC 181, HDL 49, LDL 93, TG 195 -LDL goal is currently <70, will start Lipitor 40 mg qhs for now  Hypothyroidism -TSH 03/30/16: TSH 5.820, normal T4/free T4/T3 uptake ratio -Will defer whether this warrants dosage adjustment to PCP  Prostate cancer -He is on Lupron and Xtandi -The family voices concerns about the [place] and reports that the patient has not felt well since starting this medication -In review of the serious side effects of this medication, seizures and posterior  reversible encephalopathy syndrome are listed as possible serious side effects.  While his symptoms do not appear to be specifically consistent with either of these conditions and are more consistent with TIA, will hold this medication for now.   DVT prophylaxis: Lovenox  Code Status: Full - confirmed with patient/family Family Communication: Wife, son, and daughter present throughout encounter  Disposition Plan: Home once clinically improved Consults called: Neurology; PT/OT/ST/Nutrition Admission status: It is my clinical opinion that referral for OBSERVATION is reasonable and necessary in this patient based on the above information provided. The aforementioned taken together are felt to place the patient at high risk for further clinical deterioration. However it is anticipated that the patient may be medically stable for discharge from the hospital within 24 to 48 hours.    Karmen Bongo MD [facility]  If 7PM-7AM, please contact night-coverage [email or web address] Password [facility]  04/03/2016, 8:27 PM

## 2016-04-04 ENCOUNTER — Encounter (HOSPITAL_COMMUNITY): Payer: Self-pay | Admitting: Radiology

## 2016-04-04 ENCOUNTER — Observation Stay (HOSPITAL_COMMUNITY): Payer: Medicare Other

## 2016-04-04 DIAGNOSIS — C61 Malignant neoplasm of prostate: Secondary | ICD-10-CM | POA: Diagnosis present

## 2016-04-04 DIAGNOSIS — Z6833 Body mass index (BMI) 33.0-33.9, adult: Secondary | ICD-10-CM | POA: Diagnosis not present

## 2016-04-04 DIAGNOSIS — H538 Other visual disturbances: Secondary | ICD-10-CM

## 2016-04-04 DIAGNOSIS — K627 Radiation proctitis: Secondary | ICD-10-CM | POA: Diagnosis present

## 2016-04-04 DIAGNOSIS — E669 Obesity, unspecified: Secondary | ICD-10-CM | POA: Diagnosis present

## 2016-04-04 DIAGNOSIS — Z8042 Family history of malignant neoplasm of prostate: Secondary | ICD-10-CM | POA: Diagnosis not present

## 2016-04-04 DIAGNOSIS — Z9079 Acquired absence of other genital organ(s): Secondary | ICD-10-CM | POA: Diagnosis not present

## 2016-04-04 DIAGNOSIS — Z8673 Personal history of transient ischemic attack (TIA), and cerebral infarction without residual deficits: Secondary | ICD-10-CM | POA: Diagnosis not present

## 2016-04-04 DIAGNOSIS — I672 Cerebral atherosclerosis: Secondary | ICD-10-CM | POA: Diagnosis present

## 2016-04-04 DIAGNOSIS — R4701 Aphasia: Secondary | ICD-10-CM | POA: Diagnosis not present

## 2016-04-04 DIAGNOSIS — G451 Carotid artery syndrome (hemispheric): Secondary | ICD-10-CM | POA: Diagnosis not present

## 2016-04-04 DIAGNOSIS — E785 Hyperlipidemia, unspecified: Secondary | ICD-10-CM | POA: Diagnosis present

## 2016-04-04 DIAGNOSIS — E039 Hypothyroidism, unspecified: Secondary | ICD-10-CM | POA: Diagnosis not present

## 2016-04-04 DIAGNOSIS — R930 Abnormal findings on diagnostic imaging of skull and head, not elsewhere classified: Secondary | ICD-10-CM | POA: Diagnosis not present

## 2016-04-04 DIAGNOSIS — G459 Transient cerebral ischemic attack, unspecified: Secondary | ICD-10-CM | POA: Diagnosis not present

## 2016-04-04 DIAGNOSIS — Z961 Presence of intraocular lens: Secondary | ICD-10-CM | POA: Diagnosis present

## 2016-04-04 DIAGNOSIS — Z923 Personal history of irradiation: Secondary | ICD-10-CM | POA: Diagnosis not present

## 2016-04-04 DIAGNOSIS — E559 Vitamin D deficiency, unspecified: Secondary | ICD-10-CM | POA: Diagnosis present

## 2016-04-04 DIAGNOSIS — H8110 Benign paroxysmal vertigo, unspecified ear: Secondary | ICD-10-CM | POA: Diagnosis present

## 2016-04-04 DIAGNOSIS — Z8546 Personal history of malignant neoplasm of prostate: Secondary | ICD-10-CM | POA: Diagnosis not present

## 2016-04-04 DIAGNOSIS — I10 Essential (primary) hypertension: Secondary | ICD-10-CM | POA: Diagnosis not present

## 2016-04-04 DIAGNOSIS — E782 Mixed hyperlipidemia: Secondary | ICD-10-CM | POA: Diagnosis not present

## 2016-04-04 DIAGNOSIS — M8588 Other specified disorders of bone density and structure, other site: Secondary | ICD-10-CM | POA: Diagnosis present

## 2016-04-04 DIAGNOSIS — Z87891 Personal history of nicotine dependence: Secondary | ICD-10-CM | POA: Diagnosis not present

## 2016-04-04 DIAGNOSIS — Z885 Allergy status to narcotic agent status: Secondary | ICD-10-CM | POA: Diagnosis not present

## 2016-04-04 DIAGNOSIS — G8191 Hemiplegia, unspecified affecting right dominant side: Secondary | ICD-10-CM | POA: Diagnosis present

## 2016-04-04 DIAGNOSIS — Z91018 Allergy to other foods: Secondary | ICD-10-CM | POA: Diagnosis not present

## 2016-04-04 DIAGNOSIS — Z9841 Cataract extraction status, right eye: Secondary | ICD-10-CM | POA: Diagnosis not present

## 2016-04-04 MED ORDER — SODIUM CHLORIDE 0.9 % IV SOLN
INTRAVENOUS | Status: DC
  Administered 2016-04-04: 01:00:00 via INTRAVENOUS

## 2016-04-04 MED ORDER — ASPIRIN 300 MG RE SUPP: 300.0000 mg | Freq: Every day | RECTAL | Status: DC

## 2016-04-04 MED ORDER — SENNOSIDES-DOCUSATE SODIUM 8.6-50 MG PO TABS: 1.0000 | ORAL_TABLET | Freq: Every evening | ORAL | Status: DC | PRN

## 2016-04-04 MED ORDER — ATORVASTATIN CALCIUM 10 MG PO TABS
20.0000 mg | ORAL_TABLET | Freq: Every day | ORAL | Status: DC
  Administered 2016-04-05: 20 mg via ORAL
  Filled 2016-04-04: qty 2

## 2016-04-04 MED ORDER — ENOXAPARIN SODIUM 40 MG/0.4ML ~~LOC~~ SOLN
40.0000 mg | SUBCUTANEOUS | Status: DC
  Administered 2016-04-04 – 2016-04-05 (×2): 40 mg via SUBCUTANEOUS
  Filled 2016-04-04 (×2): qty 0.4

## 2016-04-04 MED ORDER — STROKE: EARLY STAGES OF RECOVERY BOOK
Freq: Once | Status: AC
  Administered 2016-04-04: 01:00:00
  Filled 2016-04-04: qty 1

## 2016-04-04 MED ORDER — ACETAMINOPHEN 160 MG/5ML PO SOLN: 650.0000 mg | ORAL | Status: DC | PRN

## 2016-04-04 MED ORDER — ASPIRIN 325 MG PO TABS
325.0000 mg | ORAL_TABLET | Freq: Every day | ORAL | Status: DC
  Administered 2016-04-04 – 2016-04-05 (×2): 325 mg via ORAL
  Filled 2016-04-04 (×2): qty 1

## 2016-04-04 MED ORDER — ACETAMINOPHEN 325 MG PO TABS: 650.0000 mg | ORAL_TABLET | ORAL | Status: DC | PRN

## 2016-04-04 MED ORDER — LEVOTHYROXINE SODIUM 88 MCG PO TABS
88.0000 ug | ORAL_TABLET | Freq: Every day | ORAL | Status: DC
  Administered 2016-04-04 – 2016-04-05 (×2): 88 ug via ORAL
  Filled 2016-04-04 (×2): qty 1

## 2016-04-04 MED ORDER — ACETAMINOPHEN 650 MG RE SUPP: 650.0000 mg | RECTAL | Status: DC | PRN

## 2016-04-04 NOTE — Consult Note (Signed)
Referring Physician: Dr. Lorin Mercy    Chief Complaint: Expressive aphasia and RLE weakness  HPI: [name] is an 83 y.o. male who initially presented to his PCP on Saturday for expressive aphasia and RUE/RLE weakness. The first episode consisted of aphasia with RLE weakness. The second episode was as above, but also included RUE weakness. He states that he could understand everything that was said to him, but could not respond. He may have had one or more additional episodes afterwards, consisting of primarily aphasia. At time of arrival to his PCP's office, the symptoms had resolved. BP was elevated at 180/60 relative to a baseline of 138/72 earlier in the week. EMS reported 2 additional episodes PTA at PCP's office, with symptoms lasting several minutes each. His CBG was 115 en route.   The patient states that the episodes lasted about 30 minutes each, with complete resolution. He states that he has had some vertigo as well. Denies vision loss, headache, chest pain, abdominal pain, SOB or limb pain.   Per patient, he was taking ASA until about 2 months ago, when it was stopped by his ophthalmologist due to concern about possible rupture of an aneurysm "at the back of my right eye". The patient states that the aneurysm "was burned with a laser" and that he was to return to the ophthalmologist in about 3 months for follow up. Another note in EPIC describes the following: "Was told he had an aneurysm in the back of his eye about maybe 3-6 months ago and it was bleeding.  They thought it might be related to an aneurysm.  He was taken off ASA at that time.  He saw the eye doctor again in February and it was getting better. He will f/u in 3 months."    His PMHx includes prostate cancer with radiation proctitis, cystitis; hypothyroidism; HTN and HLD.  At baseline he ambulates with a cane.   CT and CTA at [facility] showed remote infarcts but no acute findings.   The patient's PMHx includes arthritis,  BPPV, right sided cataract, prostate CA, HLD, HTN,    Past Medical History:  Diagnosis Date  . Arthritis   . BNC (bladder neck contracture)   . BPPV (benign paroxysmal positional vertigo)   . Cataract    right  . Diverticulosis   . H/O diarrhea SECONDARY TO RADIATION THERAPY --  INTERMITANT  DIARRHEA  . Hematuria   . History of hemorrhagic cystitis    SECONDARY RADIATION THERAPY 1997  . History of prostate cancer CURRENTLY  ELEVATED PSA--  LUPRON INJECTIONS   S/P PROSTATECTOMY AND RADIATION THERAPY  1997  . Hyperlipidemia   . Hypertension   . Hypothyroidism   . Radiation cystitis   . Radiation proctitis   . Urinary incontinence   . Urinary retention   . Vitamin D deficiency     Past Surgical History:  Procedure Laterality Date  . CATARACT EXTRACTION W/ INTRAOCULAR LENS IMPLANT Right   . COLONOSCOPY W/ POLYPECTOMY  2005; 08/11/2010   2005: 1 cm hyperplastic sigmoid polyp, Dr. Wynetta Emery 2012: hyperplastic splenic flexure polyp -1 cm, diverticulosis, radiation proctitis, anal stenosis  . CYSTO/ FULGERATION OF BLEEDERS/ RESECTION BLADDER NECK CONTRACTURE  07-06-2004   BNC AND RADIATION CYSTITIS  . EYE SURGERY    . KNEE ARTHROSCOPY Right   . LUMBAR DISC SURGERY  1996   L4 -- L5  . PROSTATECTOMY  1997  . SPINE SURGERY    . TRANSURETHRAL RESECTION OF BLADDER NECK N/A 06/23/2012  Procedure: TRANSURETHRAL RESECTION OF BLADDER NECK CONTRACTURE WITH GYRUS;  Surgeon: Claybon Jabs, MD;  Location: [facility];  Service: Urology;  Laterality: N/A;  . TRANSURETHRAL RESECTION OF BLADDER TUMOR WITH GYRUS (TURBT-GYRUS) N/A 06/01/2013   Procedure: TRANSURETHRAL RESECTION OF BLADDER NECK CONTRACTURE WITH GYRUS (TURBT-GYRUS) AND INJECTION OF KENALOG;  Surgeon: Claybon Jabs, MD;  Location: [facility];  Service: Urology;  Laterality: N/A;    Family History  Problem Relation Age of Onset  . Prostate cancer Brother   . Kidney disease Brother   . Congestive Heart  Failure Mother   . CVA Mother 85  . Pancreatitis Father   . Pneumonia Father   . Cancer Sister     breast  . Obesity Sister   . Early death Sister     appendicitis  . Cancer Sister     breast  . Alzheimer's disease Brother   . Hip fracture Brother    Social History:  reports that he quit smoking about 53 years ago. His smoking use included Cigarettes. He quit after 20.00 years of use. He quit smokeless tobacco use about 53 years ago. His smokeless tobacco use included Chew. He reports that he does not drink alcohol or use drugs.  Allergies:  Allergies  Allergen Reactions  . Caffeine Palpitations  . Codeine Palpitations    Medications:  Scheduled: . aspirin  300 mg Rectal Daily   Or  . aspirin  325 mg Oral Daily  . atorvastatin  40 mg Oral q1800  . enoxaparin (LOVENOX) injection  40 mg Subcutaneous Q24H  . levothyroxine  88 mcg Oral QAC breakfast    ROS: As per HPI.   Physical Examination: Blood pressure (!) 199/76, pulse 66, temperature 97.5 F (36.4 C), temperature source Oral, resp. rate 12, height _0  (1.854 m), weight 113.8 kg (250 lb 14.4 oz), SpO2 97 %.  HEENT: [place]/AT Lungs: Respirations unlabored.  Ext: Warm and well-perfused  Neurologic Examination: Mental Status: Alert, oriented, thought content appropriate.  Speech fluent without evidence of aphasia; repetition and naming intact. Able to follow all commands without difficulty. Cranial Nerves: II:  Visual fields intact, PERRL III,IV, VI: ptosis not present, EOMI without nystagmus V,VII: smile symmetric, facial temp sensation normal bilaterally VIII: hearing intact to conversation IX,X: palate rises symmetrically XI: No asymmetry noted XII: midline tongue extension  Motor: Right : Upper extremity   5/5    Left:     Upper extremity   5/5  Lower extremity   5/5     Lower extremity   5/5 Sensory: Temperature and light touch intact x 4 without extinction Deep Tendon Reflexes: Trace brachioradialis and  biceps bilaterally. 0 right patella, trace left patella. 0 achilles bilaterally. Toes equivocal.  Cerebellar: No ataxia with FNF bilaterally Gait: Deferred  Results for orders placed or performed during the hospital encounter of 04/03/16 (from the past 48 hour(s))  CBG monitoring, ED     Status: None   Collection Time: 04/03/16 12:44 PM  Result Value Ref Range   Glucose-Capillary 88 65 - 99 mg/dL  Protime-INR     Status: None   Collection Time: 04/03/16 12:49 PM  Result Value Ref Range   Prothrombin Time 13.3 11.4 - 15.2 seconds   INR 1.01   APTT     Status: None   Collection Time: 04/03/16 12:49 PM  Result Value Ref Range   aPTT 28 24 - 36 seconds  CBC     Status: None  Collection Time: 04/03/16 12:49 PM  Result Value Ref Range   WBC 6.9 4.0 - 10.5 K/uL   RBC 4.85 4.22 - 5.81 MIL/uL   Hemoglobin 16.0 13.0 - 17.0 g/dL   HCT 45.7 39.0 - 52.0 %   MCV 94.2 78.0 - 100.0 fL   MCH 33.0 26.0 - 34.0 pg   MCHC 35.0 30.0 - 36.0 g/dL   RDW 13.1 11.5 - 15.5 %   Platelets 230 150 - 400 K/uL  Differential     Status: None   Collection Time: 04/03/16 12:49 PM  Result Value Ref Range   Neutrophils Relative % 57 %   Neutro Abs 4.0 1.7 - 7.7 K/uL   Lymphocytes Relative 31 %   Lymphs Abs 2.1 0.7 - 4.0 K/uL   Monocytes Relative 9 %   Monocytes Absolute 0.7 0.1 - 1.0 K/uL   Eosinophils Relative 3 %   Eosinophils Absolute 0.2 0.0 - 0.7 K/uL   Basophils Relative 0 %   Basophils Absolute 0.0 0.0 - 0.1 K/uL  Comprehensive metabolic panel     Status: Abnormal   Collection Time: 04/03/16 12:49 PM  Result Value Ref Range   Sodium 139 135 - 145 mmol/L   Potassium 4.4 3.5 - 5.1 mmol/L   Chloride 103 101 - 111 mmol/L   CO2 28 22 - 32 mmol/L   Glucose, Bld 103 (H) 65 - 99 mg/dL   BUN 18 6 - 20 mg/dL   Creatinine, Ser 1.05 0.61 - 1.24 mg/dL   Calcium 9.9 8.9 - 10.3 mg/dL   Total Protein 7.3 6.5 - 8.1 g/dL   Albumin 4.3 3.5 - 5.0 g/dL   AST 19 15 - 41 U/L   ALT 12 (L) 17 - 63 U/L   Alkaline  Phosphatase 58 38 - 126 U/L   Total Bilirubin 0.5 0.3 - 1.2 mg/dL   GFR calc non Af Amer >60 >60 mL/min   GFR calc Af Amer >60 >60 mL/min    Comment: (NOTE) The eGFR has been calculated using the CKD EPI equation. This calculation has not been validated in all clinical situations. eGFR's persistently <60 mL/min signify possible Chronic Kidney Disease.    Anion gap 8 5 - 15  I-stat troponin, ED     Status: None   Collection Time: 04/03/16  1:09 PM  Result Value Ref Range   Troponin i, poc 0.01 0.00 - 0.08 ng/mL   Comment 3            Comment: Due to the release kinetics of cTnI, a negative result within the first hours of the onset of symptoms does not rule out myocardial infarction with certainty. If myocardial infarction is still suspected, repeat the test at appropriate intervals.   I-Stat Chem 8, ED     Status: Abnormal   Collection Time: 04/03/16  1:10 PM  Result Value Ref Range   Sodium 138 135 - 145 mmol/L   Potassium 4.7 3.5 - 5.1 mmol/L   Chloride 103 101 - 111 mmol/L   BUN 19 6 - 20 mg/dL   Creatinine, Ser 1.10 0.61 - 1.24 mg/dL   Glucose, Bld 100 (H) 65 - 99 mg/dL   Calcium, Ion 1.20 1.15 - 1.40 mmol/L   TCO2 28 0 - 100 mmol/L   Hemoglobin 14.3 13.0 - 17.0 g/dL   HCT 42.0 39.0 - 52.0 %   Ct Angio Head W/cm &/or Wo Cm  Result Date: 04/03/2016 CLINICAL DATA:  Acute onset of expressive aphasia  and right lower extremity weakness which subsequently resolved. Cerebral aneurysm. EXAM: CT ANGIOGRAPHY HEAD TECHNIQUE: Multidetector CT imaging of the head was performed using the standard protocol during bolus administration of intravenous contrast. Multiplanar CT image reconstructions and MIPs were obtained to evaluate the vascular anatomy. CONTRAST:  75 mL Isovue 370 COMPARISON:  None. FINDINGS: CT HEAD Brain: Noncontrast imaging of the brain demonstrates mild generalized atrophy and white matter disease. The there are remote lacunar infarcts involving the basal ganglia  bilaterally, more prominent on the right. The brainstem is normal. Remote lacunar infarcts are present in the inferior cerebellum bilaterally. No acute cortical infarct is present. There is no acute hemorrhage or mass lesion. The ventricles are of normal size. No significant extra-axial fluid collection is present. Vascular: Atherosclerotic calcifications are present within the cavernous internal carotid arteries and at the dural margin of the left vertebral artery. Skull: Calvarium is intact. No focal lytic or blastic lesions are present. Sinuses: Posterior ethmoid air cells are opacified on the right. The remaining paranasal sinuses cannot mastoid air cells are clear. Orbits: The patient is status post bilateral lens replacements. The globes and orbits are otherwise within normal limits. CTA HEAD Anterior circulation: Atherosclerotic calcifications are present in the cavernous internal carotid arteries bilaterally without significant stenosis. There is moderate tortuosity of the cervical internal carotid artery's is below the skullbase. There is no focal stenosis or aneurysm through the ICA termini bilaterally. There is a moderate stenosis of the proximal right A1 segment. The left A1 segment is normal. The anterior communicating artery is patent. There is moderate segmental narrowing throughout the ACA branch vessels bilaterally, worse on the right. Mild narrowing is present in the proximal superior right M2 branch, just beyond the bifurcation. There is some attenuation of distal MCA branch vessels bilaterally. Posterior circulation: The left vertebral artery is slightly dominant to the right. The PICA origins are visualized and normal. The vertebrobasilar junction is normal. The basilar artery is within normal limits. Right posterior cerebral artery originates from the basilar tip. The left posterior cerebral artery is of fetal type. There is some attenuation of PCA branch vessels bilaterally. Venous sinuses:  The dural sinuses are patent. The right transverse sinus is dominant. The straight sinus and deep cerebral veins are intact. Cortical veins are unremarkable. Anatomic variants: Fetal type left posterior cerebral artery. Delayed phase: The postcontrast images exaggerate the basal ganglia infarcts. There is no pathologic enhancement. IMPRESSION: 1. Atherosclerotic calcifications within the cavernous internal carotid arteries bilaterally without significant stenosis. 2. No focal aneurysm. 3. Moderate narrowing of the proximal right A1 segment and proximal superior right M2 segment. 4. Mild diffuse the medium and distal small vessel disease. 5. Segmental disease is more significant in the right than left anterior cerebral artery. 6. Tortuosity of the high cervical internal carotid arteries bilaterally is likely related to chronic hypertension. No significant extracranial stenosis is evident. Electronically Signed   By: Marin Roberts M.D.   On: 04/03/2016 14:59    Assessment: 83 y.o. male with stuttering TIA 1. Currently back to baseline. ASA has been restarted.  2. CTA head reveals findings consistent with intracranial atherosclerotic disease. There are atherosclerotic calcifications within the cavernous internal carotid arteries bilaterally without significant stenosis. There is no focal aneurysm. Moderate narrowing of the proximal right A1 segment and proximal superior right M2 segment is noted. There is mild diffuse medium and distal small vessel disease. Segmental disease is more significant in the right than left anterior cerebral artery.  3. MRI  brain reveals no acute intracranial infarction. There are scattered remote lacunar infarcts involving the bilateral basal ganglia and periventricular white matter. Mild chronic microvascular ischemic disease is also noted. 4. EKG shows no atrial fibrillation.  5. Stroke Risk Factors - HLD, HTN, age and prior infarcts seen on CT head  Plan: 1.  Echocardiogram 2. Carotid ultrasound and CTA of neck to assess for possible critical stenosis.  3. HgbA1c, fasting lipid panel 4. PT consult, OT consult, Speech consult 5. Continue ASA. 6. Ophthalmology consult for dilated retinal exam to assess for resolution of possible right retinal artery aneurysm 7. Continue atorvastatin 8. Telemetry monitoring 9. Frequent neuro checks 10. Permissive HTN x 24 hours  _0  signed: Dr. Kerney Elbe  04/04/2016, 1:15 AM

## 2016-04-04 NOTE — Progress Notes (Signed)
Patient's BP was 199/76.  ED H&P doctor's order states to allow permissive HTN and treat only if > 220/120 however Nursing orders on file state to contact provider for SBP>180.  RN contacted provider on call who advised was okay to allow permissive up to 220/120.  RN will continue to monitor

## 2016-04-04 NOTE — Progress Notes (Signed)
Progress Note    [name]  OMB:[id number] DOB: March 21, 1932  DOA: 04/03/2016 PCP: Chevis Pretty, FNP    Brief Narrative:   Chief complaint: Follow-up expressive aphasia and right lower extremity weakness  Medical records reviewed and are as summarized below:  [name] is an 83 y.o. male the PMH of hypertension, hyperlipidemia, hypothyroidism, and prostate cancer diagnosed 1996 and treated with pubic radical prostatectomy/radiation therapy who presented to his PCP 04/03/16 for evaluation of expressive aphasia and right hemiparesis. Tums resolved after approximately 30 minutes. He has not been on aspirin for the past 2 months at the direction of his ophthalmologist due to concerns for bleeding aneurysm in his eye. He has had laser therapy for this. CT of the brain showed mode infarcts but nothing acute. MRI was also negative for acute intracranial infarct, but did show mild chronic microvascular ischemic disease and remote lacunar infarcts involving the bilateral basal ganglia and periventricular white matter. He also had moderate right A1 stenosis and severe distal left P2 stenosis.  Assessment/Plan:   Principal Problem:   TIA (transient ischemic attack) Neurology consulted. Full stroke workup in progress including echocardiogram, carotid Dopplers, CTA of the neck, hemoglobin A1c, FLP, PT/OT/ST consultations. Aspirin has been restarted. Continue risk factor modification with statin therapy. Permissive hypertension 24 hours recommended by neurologist.MRI/MRA personally reviewed:Mild to moderate intracranial atherosclerotic disease involving the anterior and posterior circulations as above. Most notable findings include a moderate right A1 stenosis and severe distal left P2 stenosis.  Active Problems:   Hypertension Will allow permissive hypertension for now.    Hyperlipidemia Continue Lipitor.    Hypothyroidism Continue Synthroid.    H/O prostate cancer Status  post prostatectomy/radiation treatment and hormone therapy.    Obesity (BMI 30-39.9) Body mass index is 33.1 kg/m.     H/O Retinal hemorrhage Status post laser treatment. Close follow-up with outpatient ophthalmologist at discharge.   Family Communication/Anticipated D/C date and plan/Code Status   DVT prophylaxis: Lovenox ordered. Code Status: Full Code.  Family Communication: Family friend at bedside. Disposition Plan: Home 04/05/16.   Medical Consultants:    Neurology   Procedures:    Echocardiogram  Anti-Infectives:    None  Subjective:   Patient denies any focal neurological deficits are present including visual changes, speech changes, focal weakness. Ambulated with a walker with physical therapy earlier today. No dysphasia.  Objective:    Vitals:   04/04/16 0024 04/04/16 0230 04/04/16 0430 04/04/16 0630  BP: (!) 199/76 (!) 183/73 (!) 199/76 (!) 186/80  Pulse: 66 62 60 63  Resp: '16 16 16 16  '$ Temp: 97.5 F (36.4 C) 97.9 F (36.6 C) 97.9 F (36.6 C) 97.5 F (36.4 C)  TempSrc: Oral Oral Oral Oral  SpO2: 97% 96% 99% 97%  Weight: 113.8 kg (250 lb 14.4 oz)     Height: '6\' 1"'$  (1.854 m)       Intake/Output Summary (Last 24 hours) at 04/04/16 0842 Last data filed at 04/04/16 4536  Gross per 24 hour  Intake              250 ml  Output               50 ml  Net              200 ml   Filed Weights   04/03/16 1251 04/04/16 0024  Weight: 112 kg (247 lb) 113.8 kg (250 lb 14.4 oz)    Exam: General exam: Appears  calm and comfortable.  Respiratory system: Clear to auscultation. Respiratory effort normal. Cardiovascular system: S1 & S2 heard, RRR. No JVD,  rubs, gallops or clicks. No murmurs. Gastrointestinal system: Abdomen is nondistended, soft and nontender. No organomegaly or masses felt. Normal bowel sounds heard. Central nervous system: Alert and oriented. No focal neurological deficits. Extremities: No clubbing,  or cyanosis. No edema. Skin: No  rashes, lesions or ulcers. Psychiatry: Judgement and insight appear normal. Mood & affect appropriate.   Data Reviewed:   I have personally reviewed following labs and imaging studies:  Labs: Basic Metabolic Panel:  Recent Labs Lab 03/30/16 1044 04/03/16 1249 04/03/16 1310  NA 138 139 138  K 5.1 4.4 4.7  CL 98 103 103  CO2 25 28  --   GLUCOSE 100* 103* 100*  BUN '12 18 19  '$ CREATININE 0.87 1.05 1.10  CALCIUM 9.9 9.9  --    GFR Estimated Creatinine Clearance: 69.7 mL/min (by C-G formula based on SCr of 1.1 mg/dL). Liver Function Tests:  Recent Labs Lab 03/30/16 1044 04/03/16 1249  AST 15 19  ALT 10 12*  ALKPHOS 55 58  BILITOT 0.5 0.5  PROT 6.8 7.3  ALBUMIN 4.3 4.3   Coagulation profile  Recent Labs Lab 04/03/16 1249  INR 1.01    CBC:  Recent Labs Lab 04/03/16 1249 04/03/16 1310  WBC 6.9  --   NEUTROABS 4.0  --   HGB 16.0 14.3  HCT 45.7 42.0  MCV 94.2  --   PLT 230  --    CBG:  Recent Labs Lab 04/03/16 1244  GLUCAP 88   Hgb A1c: No results for input(s): HGBA1C in the last 72 hours. Lipid Profile: No results for input(s): CHOL, HDL, LDLCALC, TRIG, CHOLHDL, LDLDIRECT in the last 72 hours.  Microbiology No results found for this or any previous visit (from the past 240 hour(s)).  Radiology:  All radiographic tests personally reviewed Ct Angio Head W/cm &/or Wo Cm  Result Date: 04/03/2016 CLINICAL DATA:  Acute onset of expressive aphasia and right lower extremity weakness which subsequently resolved. Cerebral aneurysm. EXAM: CT ANGIOGRAPHY HEAD TECHNIQUE: Multidetector CT imaging of the head was performed using the standard protocol during bolus administration of intravenous contrast. Multiplanar CT image reconstructions and MIPs were obtained to evaluate the vascular anatomy. CONTRAST:  75 mL Isovue 370 COMPARISON:  None. FINDINGS: CT HEAD Brain: Noncontrast imaging of the brain demonstrates mild generalized atrophy and white matter disease.  The there are remote lacunar infarcts involving the basal ganglia bilaterally, more prominent on the right. The brainstem is normal. Remote lacunar infarcts are present in the inferior cerebellum bilaterally. No acute cortical infarct is present. There is no acute hemorrhage or mass lesion. The ventricles are of normal size. No significant extra-axial fluid collection is present. Vascular: Atherosclerotic calcifications are present within the cavernous internal carotid arteries and at the dural margin of the left vertebral artery. Skull: Calvarium is intact. No focal lytic or blastic lesions are present. Sinuses: Posterior ethmoid air cells are opacified on the right. The remaining paranasal sinuses cannot mastoid air cells are clear. Orbits: The patient is status post bilateral lens replacements. The globes and orbits are otherwise within normal limits. CTA HEAD Anterior circulation: Atherosclerotic calcifications are present in the cavernous internal carotid arteries bilaterally without significant stenosis. There is moderate tortuosity of the cervical internal carotid artery's is below the skullbase. There is no focal stenosis or aneurysm through the ICA termini bilaterally. There is a moderate stenosis of  the proximal right A1 segment. The left A1 segment is normal. The anterior communicating artery is patent. There is moderate segmental narrowing throughout the ACA branch vessels bilaterally, worse on the right. Mild narrowing is present in the proximal superior right M2 branch, just beyond the bifurcation. There is some attenuation of distal MCA branch vessels bilaterally. Posterior circulation: The left vertebral artery is slightly dominant to the right. The PICA origins are visualized and normal. The vertebrobasilar junction is normal. The basilar artery is within normal limits. Right posterior cerebral artery originates from the basilar tip. The left posterior cerebral artery is of fetal type. There is some  attenuation of PCA branch vessels bilaterally. Venous sinuses: The dural sinuses are patent. The right transverse sinus is dominant. The straight sinus and deep cerebral veins are intact. Cortical veins are unremarkable. Anatomic variants: Fetal type left posterior cerebral artery. Delayed phase: The postcontrast images exaggerate the basal ganglia infarcts. There is no pathologic enhancement. IMPRESSION: 1. Atherosclerotic calcifications within the cavernous internal carotid arteries bilaterally without significant stenosis. 2. No focal aneurysm. 3. Moderate narrowing of the proximal right A1 segment and proximal superior right M2 segment. 4. Mild diffuse the medium and distal small vessel disease. 5. Segmental disease is more significant in the right than left anterior cerebral artery. 6. Tortuosity of the high cervical internal carotid arteries bilaterally is likely related to chronic hypertension. No significant extracranial stenosis is evident. Electronically Signed   By: San Morelle M.D.   On: 04/03/2016 14:59   Mr Brain Wo Contrast  Result Date: 04/04/2016 EXAM: MRI HEAD WITHOUT CONTRAST MRA HEAD WITHOUT CONTRAST TECHNIQUE: Multiplanar, multiecho pulse sequences of the brain and surrounding structures were obtained without intravenous contrast. Angiographic images of the head were obtained using MRA technique without contrast. COMPARISON:  Prior CT from 04/03/2016. FINDINGS: MRI HEAD FINDINGS Brain: Diffuse prominence of the CSF containing spaces is compatible with generalized cerebral atrophy. Patchy and confluent T2/FLAIR hyperintensity within the periventricular and deep white matter both cerebral hemispheres most consistent with chronic microvascular ischemic disease. Scattered remote lacunar infarcts present within the bilateral basal ganglia and periventricular white matter. No abnormal foci of restricted diffusion to suggest acute or subacute ischemia. Minimal patchy diffusion abnormality  within the left periatrial white matter demonstrates no associated ADC correlate, and felt to reflect T2 shine through. Gray-white matter differentiation maintained. No evidence for acute or chronic intracranial hemorrhage. No mass lesion, midline shift, or mass effect. No hydrocephalus. No extra-axial fluid collection. Major dural sinuses are grossly patent. Incidental note made of an empty sella. Vascular: Major intracranial vascular flow voids maintained. Skull and upper cervical spine: Craniocervical junction normal. Visualized upper cervical spine unremarkable. Bone marrow signal intensity within normal limits. No scalp soft tissue abnormality. Sinuses/Orbits: Globes and orbital soft tissues within normal limits. Patient is status post lens extraction bilaterally. Paranasal sinuses are largely clear. No mastoid effusion. Inner ear structures normal. Other: None MRA HEAD FINDINGS ANTERIOR CIRCULATION: Distal cervical segments of the internal carotid arteries are patent with antegrade flow. Petrous segments patent bilaterally. Atheromatous regularity within the cavernous/ supraclinoid ICAs without flow-limiting stenosis. Short-segment moderate stenosis proximal right A1 segment (series 5, image 131). A1 segments otherwise patent. Anterior cerebral arteries demonstrate mild atheromatous irregularity but are patent to their distal aspects. Multifocal atheromatous irregularity within the M1 segments bilaterally without flow limiting stenosis. No proximal M2 occlusion distal small vessel atheromatous irregularity within the MCA branches bilaterally. POSTERIOR CIRCULATION: Vertebral arteries patent to the vertebrobasilar junction. Posterior inferior cerebral  arteries patent proximally. Basilar artery widely patent without stenosis. Superior cerebral arteries patent bilaterally. Right PCA supplied via the basilar artery and is widely patent to its distal aspect. Fetal type left PCA supplied via a widely patent left  posterior communicating artery. Atheromatous regularity with distal severe left P2 stenosis (series 555, image 12). No aneurysm or vascular malformation. IMPRESSION: MRI HEAD IMPRESSION: 1. No acute intracranial infarct or other process identified. 2. Scattered remote lacunar infarcts involving the bilateral basal ganglia and periventricular white matter. 3. Mild chronic microvascular ischemic disease. MRA HEAD IMPRESSION: 1. Negative MRA for large or proximal branch occlusion. No correctable stenosis. 2. Mild to moderate intracranial atherosclerotic disease involving the anterior and posterior circulations as above. Most notable findings include a moderate right A1 stenosis and severe distal left P2 stenosis. Electronically Signed   By: Jeannine Boga M.D.   On: 04/04/2016 05:50   Mr Jodene Nam Head/brain ON Cm  Result Date: 04/04/2016 EXAM: MRI HEAD WITHOUT CONTRAST MRA HEAD WITHOUT CONTRAST TECHNIQUE: Multiplanar, multiecho pulse sequences of the brain and surrounding structures were obtained without intravenous contrast. Angiographic images of the head were obtained using MRA technique without contrast. COMPARISON:  Prior CT from 04/03/2016. FINDINGS: MRI HEAD FINDINGS Brain: Diffuse prominence of the CSF containing spaces is compatible with generalized cerebral atrophy. Patchy and confluent T2/FLAIR hyperintensity within the periventricular and deep white matter both cerebral hemispheres most consistent with chronic microvascular ischemic disease. Scattered remote lacunar infarcts present within the bilateral basal ganglia and periventricular white matter. No abnormal foci of restricted diffusion to suggest acute or subacute ischemia. Minimal patchy diffusion abnormality within the left periatrial white matter demonstrates no associated ADC correlate, and felt to reflect T2 shine through. Gray-white matter differentiation maintained. No evidence for acute or chronic intracranial hemorrhage. No mass lesion,  midline shift, or mass effect. No hydrocephalus. No extra-axial fluid collection. Major dural sinuses are grossly patent. Incidental note made of an empty sella. Vascular: Major intracranial vascular flow voids maintained. Skull and upper cervical spine: Craniocervical junction normal. Visualized upper cervical spine unremarkable. Bone marrow signal intensity within normal limits. No scalp soft tissue abnormality. Sinuses/Orbits: Globes and orbital soft tissues within normal limits. Patient is status post lens extraction bilaterally. Paranasal sinuses are largely clear. No mastoid effusion. Inner ear structures normal. Other: None MRA HEAD FINDINGS ANTERIOR CIRCULATION: Distal cervical segments of the internal carotid arteries are patent with antegrade flow. Petrous segments patent bilaterally. Atheromatous regularity within the cavernous/ supraclinoid ICAs without flow-limiting stenosis. Short-segment moderate stenosis proximal right A1 segment (series 5, image 131). A1 segments otherwise patent. Anterior cerebral arteries demonstrate mild atheromatous irregularity but are patent to their distal aspects. Multifocal atheromatous irregularity within the M1 segments bilaterally without flow limiting stenosis. No proximal M2 occlusion distal small vessel atheromatous irregularity within the MCA branches bilaterally. POSTERIOR CIRCULATION: Vertebral arteries patent to the vertebrobasilar junction. Posterior inferior cerebral arteries patent proximally. Basilar artery widely patent without stenosis. Superior cerebral arteries patent bilaterally. Right PCA supplied via the basilar artery and is widely patent to its distal aspect. Fetal type left PCA supplied via a widely patent left posterior communicating artery. Atheromatous regularity with distal severe left P2 stenosis (series 555, image 12). No aneurysm or vascular malformation. IMPRESSION: MRI HEAD IMPRESSION: 1. No acute intracranial infarct or other process  identified. 2. Scattered remote lacunar infarcts involving the bilateral basal ganglia and periventricular white matter. 3. Mild chronic microvascular ischemic disease. MRA HEAD IMPRESSION: 1. Negative MRA for large or proximal branch occlusion.  No correctable stenosis. 2. Mild to moderate intracranial atherosclerotic disease involving the anterior and posterior circulations as above. Most notable findings include a moderate right A1 stenosis and severe distal left P2 stenosis. Electronically Signed   By: Jeannine Boga M.D.   On: 04/04/2016 05:50    Medications:   . aspirin  300 mg Rectal Daily   Or  . aspirin  325 mg Oral Daily  . atorvastatin  40 mg Oral q1800  . enoxaparin (LOVENOX) injection  40 mg Subcutaneous Q24H  . levothyroxine  88 mcg Oral QAC breakfast   Continuous Infusions: . sodium chloride 50 mL/hr at 04/04/16 0450    Medical decision making is of high complexity and this patient is at high risk of deterioration, therefore this is a level 3 visit.  (> 4 problem points, >4 data points, high risk: Recent change in neuro status)    LOS: 0 days   Yitzchok Carriger  [facility] Pager [phone]. If unable to reach me by pager, please call my cell phone at [phone].  *Please refer to [email or web address], password [email or web address] to get updated schedule on who will round on this patient, as hospitalists switch teams weekly. If 7PM-7AM, please contact night-coverage at [email or web address], password [email or web address] for any overnight needs.  04/04/2016, 8:42 AM

## 2016-04-04 NOTE — Evaluation (Signed)
Occupational Therapy Evaluation and Discharge Patient Details Name: [name] MRN: [id number] DOB: 1932-06-08 Today's Date: 04/04/2016    History of Present Illness [name] is an 83 y.o. male who initially presented to his PCP on Saturday for expressive aphasia and RUE/RLE weakness. Episodic in nature, and typically lasts 30 minutes; His PMHx includes prostate cancer with radiation proctitis, cystitis; hypothyroidism; HTN and HLD.  At baseline he ambulates with a cane.   Clinical Impression   PTA Pt with min A for LB ADL and using SPC for mobility. Pt currently back to baseline. OT discussed shower safety and AE for bathing at length with Pt and family as it is a high fall risk area. Education complete. No questions or concerns at the end of the session from Pt or family members. OT to sign off at this point, thank you for the referral.     Follow Up Recommendations  No OT follow up;Supervision/Assistance - 24 hour    Equipment Recommendations  None recommended by OT    Recommendations for Other Services       Precautions / Restrictions Precautions Precautions: Fall Precaution Comments: Fall risk greatly reduced with use of assistive device Restrictions Weight Bearing Restrictions: No      Mobility Bed Mobility Overal bed mobility: Modified Independent             General bed mobility comments: dependent on momentum to get up  Transfers Overall transfer level: Needs assistance Equipment used: Rolling walker (2 wheeled) Transfers: Sit to/from Stand Sit to Stand: Supervision         General transfer comment: Slow to rise and heavy dependence on UE support    Balance Overall balance assessment: Needs assistance Sitting-balance support: No upper extremity supported;Feet supported Sitting balance-Leahy Scale: Normal Sitting balance - Comments: sitting EOB performing leaning balance activities, LB dressing   Standing balance support: Bilateral upper  extremity supported;During functional activity;No upper extremity supported Standing balance-Leahy Scale: Fair Standing balance comment: standing at sink for grooming, use of RW during mobility for balance                            ADL Overall ADL's : At baseline                                       General ADL Comments: supervision for safety this session, Pt able to don/doff socks sitting EOB (although wife typically does this at home) and perform sink level grooming with no problems     Vision Baseline Vision/History: Wears glasses Wears Glasses: At all times Patient Visual Report: No change from baseline Vision Assessment?: Yes Additional Comments: WFL during functional testing both near and far within room, Pt able to read off menu and find objects in a bucket for ADL     Perception     Praxis      Pertinent Vitals/Pain Pain Assessment: No/denies pain     Hand Dominance Right   Extremity/Trunk Assessment Upper Extremity Assessment Upper Extremity Assessment: Overall WFL for tasks assessed;Generalized weakness   Lower Extremity Assessment Lower Extremity Assessment: Defer to PT evaluation       Communication Communication Communication: No difficulties   Cognition Arousal/Alertness: Awake/alert Behavior During Therapy: WFL for tasks assessed/performed Overall Cognitive Status: Within Functional Limits for tasks assessed  General Comments  wife and son in room during session    Exercises       Shoulder [place] expects to be discharged to:: Private residence Living Arrangements: Spouse/significant other Available Help at Discharge: Family;Available 24 hours/day Type of Home: House Home Access: Stairs to enter [employer] of Steps: 3 Entrance Stairs-Rails: Right Home Layout: One level     Bathroom Shower/Tub: Occupational psychologist:  Handicapped height Bathroom Accessibility: Yes How Accessible: Accessible via walker Home Equipment: [place] - single point;Shower seat - built in;Grab bars - toilet;Grab bars - tub/shower;Hand held shower head          Prior Functioning/Environment Level of Independence: Independent with assistive device(s)        Comments: uses a cane primarily        OT Problem List: Impaired balance (sitting and/or standing)      OT Treatment/Interventions:      OT Goals(Current goals can be found in the care plan section) Acute Rehab OT Goals Patient Stated Goal: Get tests done and go home OT Goal Formulation: With patient/family Time For Goal Achievement: 04/18/16 Potential to Achieve Goals: Good  OT Frequency:     Barriers to D/C:            Co-evaluation              End of Session Equipment Utilized During Treatment: Gait belt;Rolling walker Nurse Communication: Mobility status  Activity Tolerance: Patient tolerated treatment well Patient left: in bed;with bed alarm set;with call bell/phone within reach;with family/visitor present  OT Visit Diagnosis: Unsteadiness on feet (R26.81);Other symptoms and signs involving the nervous system (R29.898)                ADL either performed or assessed with clinical judgement  Time: 0370-4888 OT Time Calculation (min): 45 min Charges:  OT General Charges $OT Visit: 1 Procedure OT Evaluation $OT Eval Low Complexity: 1 Procedure OT Treatments $Self Care/Home Management : 8-22 mins $Therapeutic Activity: 8-22 mins G-Codes: OT G-codes **NOT FOR INPATIENT CLASS** Functional Assessment Tool Used: AM-PAC 6 Clicks Daily Activity Functional Limitation: Self care Self Care Current Status (B1694): At least 20 percent but less than 40 percent impaired, limited or restricted Self Care Goal Status (H0388): At least 1 percent but less than 20 percent impaired, limited or restricted Self Care Discharge Status [phone]): At least 20 percent  but less than 40 percent impaired, limited or restricted   Hulda Humphrey OTR/L [place] 04/04/2016, 4:32 PM

## 2016-04-04 NOTE — Progress Notes (Signed)
STROKE TEAM PROGRESS NOTE   HISTORY OF PRESENT ILLNESS (per record) [name] is an 83 y.o. male who initially presented to his PCP on Saturday for expressive aphasia and RUE/RLE weakness. The first episode consisted of aphasia with RLE weakness. The second episode was as above, but also included RUE weakness. He states that he could understand everything that was said to him, but could not respond. He may have had one or more additional episodes afterwards, consisting of primarily aphasia. At time of arrival to his PCP's office, the symptoms had resolved. BP was elevated at 180/60 relative to a baseline of 138/72 earlier in the week. EMS reported 2 additional episodes PTA at PCP's office, with symptoms lasting several minutes each. His CBG was 115 en route.   The patient states that the episodes lasted about 30 minutes each, with complete resolution. He states that he has had some vertigo as well. Denies vision loss, headache, chest pain, abdominal pain, SOB or limb pain.   Per patient, he was taking ASA until about 2 months ago, when it was stopped by his ophthalmologist due to concern about possible rupture of an aneurysm "at the back of my right eye". The patient states that the aneurysm "was burned with a laser" and that he was to return to the ophthalmologist in about 3 months for follow up. Another note in EPIC describes the following: "Was told he had an aneurysm in the back of his eye about maybe 3-3month ago and it was bleeding. They thought it might be related to an aneurysm. He was taken off ASA at that time. He saw the eye doctoragain in February and it was getting better. He will f/u in 3 months."    SUBJECTIVE (INTERVAL HISTORY) His son and wife are at the bedside.  Pt is now back to baseline. Had two episodes of aphasia and right hemiparesis yesterday. Denies heat palpitation or headache. Pt also had sudden right eye blurry vision 2 months ago and was treated for retinal  bleeding by Dr. MZigmund Daniel No hx of DM.    OBJECTIVE Temp:  [97.5 F (36.4 C)-98.1 F (36.7 C)] 97.5 F (36.4 C) (03/11 0630) Pulse Rate:  [60-83] 63 (03/11 0630) Cardiac Rhythm: Heart block (03/11 0027) Resp:  [8-19] 16 (03/11 0630) BP: (147-202)/(60-87) 186/80 (03/11 0630) SpO2:  [94 %-99 %] 97 % (03/11 0630) Weight:  [112 kg (247 lb)-113.8 kg (250 lb 14.4 oz)] 113.8 kg (250 lb 14.4 oz) (03/11 0024)  CBC:  Recent Labs Lab 04/03/16 1249 04/03/16 1310  WBC 6.9  --   NEUTROABS 4.0  --   HGB 16.0 14.3  HCT 45.7 42.0  MCV 94.2  --   PLT 230  --     Basic Metabolic Panel:  Recent Labs Lab 03/30/16 1044 04/03/16 1249 04/03/16 1310  NA 138 139 138  K 5.1 4.4 4.7  CL 98 103 103  CO2 25 28  --   GLUCOSE 100* 103* 100*  BUN '12 18 19  '$ CREATININE 0.87 1.05 1.10  CALCIUM 9.9 9.9  --     Lipid Panel:    Component Value Date/Time   CHOL 181 03/30/2016 1044   CHOL 172 07/04/2012 1518   TRIG 195 (H) 03/30/2016 1044   TRIG 167 (H) 06/27/2014 0921   TRIG 205 (H) 07/04/2012 1518   HDL 49 03/30/2016 1044   HDL 49 06/27/2014 0921   HDL 50 07/04/2012 1518   CHOLHDL 3.7 03/30/2016 1044   LDLCALC 93  03/30/2016 1044   [place] 95 07/09/2013 0915   LDLCALC 81 07/04/2012 1518   HgbA1c: No results found for: HGBA1C Urine Drug Screen: No results found for: LABOPIA, COCAINSCRNUR, LABBENZ, AMPHETMU, THCU, LABBARB    IMAGING I have personally reviewed the radiological images below and agree with the radiology interpretations.  Ct Angio Head W/cm &/or Wo Cm 04/03/2016 1. Atherosclerotic calcifications within the cavernous internal carotid arteries bilaterally without significant stenosis.  2. No focal aneurysm.  3. Moderate narrowing of the proximal right A1 segment and proximal superior right M2 segment.  4. Mild diffuse the medium and distal small vessel disease.  5. Segmental disease is more significant in the right than left anterior cerebral artery.  6. Tortuosity of the  high cervical internal carotid arteries bilaterally is likely related to chronic hypertension. No significant extracranial stenosis is evident.   Mr Jodene Nam Head/brain Wo Cm 04/04/2016 MRI HEAD  1. No acute intracranial infarct or other process identified.  2. Scattered remote lacunar infarcts involving the bilateral basal ganglia and periventricular white matter.  3. Mild chronic microvascular ischemic disease.  MRA HEAD  1. Negative MRA for large or proximal branch occlusion. No correctable stenosis.  2. Mild to moderate intracranial atherosclerotic disease involving the anterior and posterior circulations as above. Most notable findings include a moderate right A1 stenosis and severe distal left P2 stenosis.   CUS pending  TTE pending  EEG pending   PHYSICAL EXAM  Temp:  [97.5 F (36.4 C)-98 F (36.7 C)] 98 F (36.7 C) (03/11 1343) Pulse Rate:  [60-72] 66 (03/11 1343) Resp:  [8-19] 18 (03/11 1343) BP: (147-202)/(60-81) 152/60 (03/11 1343) SpO2:  [94 %-99 %] 96 % (03/11 1343) Weight:  [250 lb 14.4 oz (113.8 kg)] 250 lb 14.4 oz (113.8 kg) (03/11 0024)  General - Well nourished, well developed, in no apparent distress.  Ophthalmologic - Fundi not visualized due to noncooperation.  Cardiovascular - Regular rate and rhythm.  Mental Status -  Level of arousal and orientation to time, place, and person were intact. Language including expression, naming, repetition, comprehension was assessed and found intact. Attention span and concentration were normal. Fund of Knowledge was assessed and was intact.  Cranial Nerves II - XII - II - Visual field intact OU. III, IV, VI - Extraocular movements intact. V - Facial sensation intact bilaterally. VII - Facial movement intact bilaterally. VIII - Hearing & vestibular intact bilaterally. X - Palate elevates symmetrically. XI - Chin turning & shoulder shrug intact bilaterally. XII - Tongue protrusion intact.  Motor Strength - The  patient's strength was normal in all extremities and pronator drift was absent.  Bulk was normal and fasciculations were absent.   Motor Tone - Muscle tone was assessed at the neck and appendages and was normal.  Reflexes - The patient's reflexes were 1+ in all extremities and he had no pathological reflexes.  Sensory - Light touch, temperature/pinprick were assessed and were symmetrical.    Coordination - The patient had normal movements in the hands and feet with no ataxia or dysmetria.  Tremor was absent.  Gait and Station - deferred   ASSESSMENT/PLAN [name] is a 83 y.o. male with history of hypothyroidism, previous lacunar infarcts by imaging, hypertension, benign paroxysmal positional vertigo, hyperlipidemia, history of an ophthalmic aneurysm, and history of prostate cancer presenting with expressive aphasia, vertigo, and right-sided weakness. He did not receive IV t-PA due to resolution of deficits.  TIA with aphasia and right hemiparesis  Resultant  deficit resolved  MRI - No acute intracranial infarct   MRA - moderate right A1 stenosis and severe distal left P2 stenosis.   Carotid Doppler - pending  2D Echo - pending  EEG pending  Recommend 30 day cardiac event monitoring as outpt to rule out afib  LDL - 93  HgbA1c - pending  VTE prophylaxis - Lovenox  Diet heart healthy/carb modified Room service appropriate? Yes; Fluid consistency: Thin  No antithrombotic prior to admission, now on aspirin 325 mg daily. Continue ASA on discharge.  Patient counseled to be compliant with his antithrombotic medications  Ongoing aggressive stroke risk factor management  Therapy recommendations: Outpatient physical therapy recommended  Disposition:  Pending  Sudden right eye blurry vision  Happened 2 months ago  Treated for retinal bleeding by Dr. Zigmund Daniel   No hx of DM  Suspicious for CRAO  ASA was on hold for the last 2 months  Now ASA  resumed.  Hypertension  Blood pressure running high (Norvasc 10 mg daily prior to admission)  Long-term BP goal normotensive  Hyperlipidemia  Home meds: No statin medications prior to admission  LDL 93, goal < 70  Now on Lipitor 20 mg daily  Continue statin at discharge  Other Stroke Risk Factors  Advanced age  quit smoking more than 50 years ago.  Obesity, Body mass index is 33.1 kg/m., recommend weight loss, diet and exercise as appropriate   Family hx stroke (mother)  Previous strokes by imaging  Other Active Problems  History of benign paroxysmal positional vertigo  Hx of prostate cancer - s/p resection - on lupron and [facility] day # 0  Rosalin Hawking, MD PhD Stroke Neurology 04/04/2016 6:23 PM   To contact Stroke Continuity provider, please refer to [email or web address] After hours, contact General Neurology

## 2016-04-04 NOTE — Evaluation (Signed)
Physical Therapy Evaluation Patient Details Name: [name] MRN: [id number] DOB: Mar 30, 1932 Today's Date: 04/04/2016   History of Present Illness  [name] is an 83 y.o. male who initially presented to his PCP on Saturday for expressive aphasia and RUE/RLE weakness. Episodic in nature, and typically lasts 30 minutes; His PMHx includes prostate cancer with radiation proctitis, cystitis; hypothyroidism; HTN and HLD.  At baseline he ambulates with a cane.  Clinical Impression   Pt admitted with above diagnosis. Pt currently with functional limitations due to the deficits listed below (see PT Problem List). Upon today's PT eval, [name] symptoms have resolved; He is close to, if not at, baseline; We walked with unilateral UE support to approximate cane, and with bilateral support from RW; much steadier with RW, and he is considering getting one; He describes some chronic R knee issues -- worth considering Outpt PT to address;  Pt will benefit from skilled PT to increase their independence and safety with mobility to allow discharge to the venue listed below.       Follow Up Recommendations Outpatient PT;Supervision - Intermittent    Equipment Recommendations  Rolling walker with 5" wheels    Recommendations for Other Services OT consult     Precautions / Restrictions Precautions Precautions: Fall Precaution Comments: Fall risk greatly reduced with use of assistive device Restrictions Weight Bearing Restrictions: No      Mobility  Bed Mobility Overal bed mobility: Modified Independent             General bed mobility comments: dependent on momentum to get up  Transfers Overall transfer level: Needs assistance Equipment used: None Transfers: Sit to/from Stand Sit to Stand: Supervision         General transfer comment: Slow to rise and heavy dependence on UE support  Ambulation/Gait Ambulation/Gait assistance: Min guard;Supervision   Assistive  device: None;Rolling walker (2 wheeled) Gait Pattern/deviations: Step-through pattern;Decreased stride length     General Gait Details: Initially ambulated with pushing IV pole and using hallway rail for unilateral support; less steady, and requiring minguard assist; more steady with RW, walked with Supervision  Stairs            Wheelchair Mobility    Modified Rankin (Stroke Patients Only) Modified Rankin (Stroke Patients Only) Pre-Morbid Rankin Score: No significant disability Modified Rankin: No significant disability     Balance                                             Pertinent Vitals/Pain Pain Assessment: No/denies pain (but he tells me his right knee has pain "flare ups")    Home Living Family/patient expects to be discharged to:: Private residence Living Arrangements: Spouse/significant other Available Help at Discharge: Family;Available 24 hours/day Type of Home: House Home Access: Stairs to enter Entrance Stairs-Rails: Right Entrance Stairs-Number of Steps: 3 Home Layout: One level Home Equipment: Cane - single point;Shower seat - built in;Hand held shower head      Prior Function Level of Independence: Independent with assistive device(s)         Comments: uses a cane primarily     Hand Dominance        Extremity/Trunk Assessment   Upper Extremity Assessment Upper Extremity Assessment: Defer to OT evaluation    Lower Extremity Assessment Lower Extremity Assessment: Generalized weakness  Communication   Communication: No difficulties  Cognition Arousal/Alertness: Awake/alert Behavior During Therapy: WFL for tasks assessed/performed Overall Cognitive Status: Within Functional Limits for tasks assessed                      General Comments      Exercises     Assessment/Plan    PT Assessment Patient needs continued PT services  PT Problem List Decreased strength;Decreased range of  motion;Decreased activity tolerance;Decreased balance;Decreased mobility;Decreased coordination;Decreased knowledge of use of DME       PT Treatment Interventions DME instruction;Gait training;Stair training;Functional mobility training;Therapeutic activities;Therapeutic exercise;Balance training;Patient/family education    PT Goals (Current goals can be found in the Care Plan section)  Acute Rehab PT Goals Patient Stated Goal: Get tests done and go home    Frequency Min 4X/week   Barriers to discharge        Co-evaluation               End of Session Equipment Utilized During Treatment: Gait belt Activity Tolerance: Patient tolerated treatment well Patient left: Other (comment) (going to Radiology) Nurse Communication: Mobility status PT Visit Diagnosis: Unsteadiness on feet (R26.81);Muscle weakness (generalized) (M62.81)    Functional Assessment Tool Used: Clinical judgement Functional Limitation: Mobility: Walking and moving around Mobility: Walking and Moving Around Current Status (G9201): At least 20 percent but less than 40 percent impaired, limited or restricted Mobility: Walking and Moving Around Goal Status [phone]): At least 1 percent but less than 20 percent impaired, limited or restricted    Time: 0820-0846 PT Time Calculation (min) (ACUTE ONLY): 26 min   Charges:   PT Evaluation $PT Eval Low Complexity: 1 Procedure PT Treatments $Gait Training: 8-22 mins   PT G Codes:   PT G-Codes **NOT FOR INPATIENT CLASS** Functional Assessment Tool Used: Clinical judgement Functional Limitation: Mobility: Walking and moving around Mobility: Walking and Moving Around Current Status (R9758): At least 20 percent but less than 40 percent impaired, limited or restricted Mobility: Walking and Moving Around Goal Status [phone]): At least 1 percent but less than 20 percent impaired, limited or restricted     Colletta Maryland 04/04/2016, 9:04 AM  [name], [place] Pager [phone] Office [phone]

## 2016-04-05 ENCOUNTER — Inpatient Hospital Stay (HOSPITAL_COMMUNITY): Payer: Medicare Other

## 2016-04-05 ENCOUNTER — Inpatient Hospital Stay (HOSPITAL_COMMUNITY)
Admit: 2016-04-05 | Discharge: 2016-04-05 | Disposition: A | Discharge status: Home / Self Care | Payer: Medicare Other | Attending: Neurology | Admitting: Neurology

## 2016-04-05 DIAGNOSIS — G451 Carotid artery syndrome (hemispheric): Secondary | ICD-10-CM

## 2016-04-05 DIAGNOSIS — G459 Transient cerebral ischemic attack, unspecified: Secondary | ICD-10-CM

## 2016-04-05 DIAGNOSIS — R4701 Aphasia: Secondary | ICD-10-CM | POA: Insufficient documentation

## 2016-04-05 LAB — ECHOCARDIOGRAM COMPLETE
HEIGHTINCHES: 73 in
WEIGHTICAEL: 4014.4 [oz_av]

## 2016-04-05 LAB — HEMOGLOBIN A1C
Hgb A1c MFr Bld: 5.5 % (ref 4.8–5.6)
MEAN PLASMA GLUCOSE: 111 mg/dL

## 2016-04-05 LAB — VAS US CAROTID
LCCAPSYS: 81 cm/s
LEFT ECA DIAS: 0 cm/s
LEFT VERTEBRAL DIAS: -11 cm/s
Left CCA dist dias: -8 cm/s
Left CCA dist sys: -67 cm/s
Left CCA prox dias: 8 cm/s
Left ICA dist dias: -14 cm/s
Left ICA dist sys: -55 cm/s
Left ICA prox dias: 12 cm/s
Left ICA prox sys: 60 cm/s
RCCADSYS: -43 cm/s
RCCAPSYS: -52 cm/s
RIGHT ECA DIAS: 0 cm/s
RIGHT VERTEBRAL DIAS: -8 cm/s
Right CCA prox dias: -6 cm/s

## 2016-04-05 MED ORDER — ASPIRIN 325 MG PO TABS: 325.0000 mg | ORAL_TABLET | Freq: Every day | ORAL | Status: DC

## 2016-04-05 MED ORDER — ATORVASTATIN CALCIUM 20 MG PO TABS: 20.0000 mg | ORAL_TABLET | Freq: Every day | ORAL | 2 refills | Status: DC

## 2016-04-05 NOTE — Progress Notes (Signed)
EEG completed, results pending. 

## 2016-04-05 NOTE — Progress Notes (Signed)
Echocardiogram 2D Echocardiogram has been performed.  Aggie Cosier 04/05/2016, 2:11 PM

## 2016-04-05 NOTE — Progress Notes (Signed)
STROKE TEAM PROGRESS NOTE   SUBJECTIVE (INTERVAL HISTORY) His son and wife are at the bedside. No acute neuro changes overnight. Had TTE, EEG and CUS all unremarkable. Ok from stroke standpoint to be discharged, but need 30 day cardiac event monitoring setup.     OBJECTIVE Temp:  [97.7 F (36.5 C)-98.4 F (36.9 C)] 97.7 F (36.5 C) (03/12 0531) Pulse Rate:  [58-62] 58 (03/12 0531) Cardiac Rhythm: Normal sinus rhythm;Heart block (03/12 0700) Resp:  [18-20] 20 (03/12 0531) BP: (164-185)/(62-73) 175/63 (03/12 0531) SpO2:  [97 %-98 %] 98 % (03/12 0531)  CBC:   Recent Labs Lab 04/03/16 1249 04/03/16 1310  WBC 6.9  --   NEUTROABS 4.0  --   HGB 16.0 14.3  HCT 45.7 42.0  MCV 94.2  --   PLT 230  --     Basic Metabolic Panel:   Recent Labs Lab 03/30/16 1044 04/03/16 1249 04/03/16 1310  NA 138 139 138  K 5.1 4.4 4.7  CL 98 103 103  CO2 25 28  --   GLUCOSE 100* 103* 100*  BUN '12 18 19  '$ CREATININE 0.87 1.05 1.10  CALCIUM 9.9 9.9  --     Lipid Panel:     Component Value Date/Time   CHOL 181 03/30/2016 1044   CHOL 172 07/04/2012 1518   TRIG 195 (H) 03/30/2016 1044   TRIG 167 (H) 06/27/2014 0921   TRIG 205 (H) 07/04/2012 1518   HDL 49 03/30/2016 1044   HDL 49 06/27/2014 0921   HDL 50 07/04/2012 1518   CHOLHDL 3.7 03/30/2016 1044   LDLCALC 93 03/30/2016 1044   LDLCALC 95 07/09/2013 0915   LDLCALC 81 07/04/2012 1518   HgbA1c:  Lab Results  Component Value Date   HGBA1C 5.5 04/04/2016   Urine Drug Screen: No results found for: LABOPIA, COCAINSCRNUR, LABBENZ, AMPHETMU, THCU, LABBARB    IMAGING I have personally reviewed the radiological images below and agree with the radiology interpretations.  Ct Angio Head W/cm &/or Wo Cm 04/03/2016 1. Atherosclerotic calcifications within the cavernous internal carotid arteries bilaterally without significant stenosis.  2. No focal aneurysm.  3. Moderate narrowing of the proximal right A1 segment and proximal superior  right M2 segment.  4. Mild diffuse the medium and distal small vessel disease.  5. Segmental disease is more significant in the right than left anterior cerebral artery.  6. Tortuosity of the high cervical internal carotid arteries bilaterally is likely related to chronic hypertension. No significant extracranial stenosis is evident.   Mr Jodene Nam Head/brain Wo Cm 04/04/2016 MRI HEAD  1. No acute intracranial infarct or other process identified.  2. Scattered remote lacunar infarcts involving the bilateral basal ganglia and periventricular white matter.  3. Mild chronic microvascular ischemic disease.  MRA HEAD  1. Negative MRA for large or proximal branch occlusion. No correctable stenosis.  2. Mild to moderate intracranial atherosclerotic disease involving the anterior and posterior circulations as above. Most notable findings include a moderate right A1 stenosis and severe distal left P2 stenosis.   CUS Bilateral: 1-39% ICA stenosis. Vertebral artery flow is antegrade.  TTE - Left ventricle: The cavity size was normal. Systolic function was   mildly reduced. The estimated ejection fraction was in the range   of 45% to 50%. Diffuse hypokinesis. Doppler parameters are   consistent with abnormal left ventricular relaxation (grade 1   diastolic dysfunction). - Ventricular septum: Septal motion showed abnormal function and   dyssynergy. - Aortic valve: Trileaflet; mildly thickened,  mildly calcified   leaflets. There was mild regurgitation. Impressions: - No cardiac source of emboli was indentified.  EEG This awake and asleep EEG is normal.    PHYSICAL EXAM  Temp:  [97.7 F (36.5 C)-98.4 F (36.9 C)] 97.7 F (36.5 C) (03/12 0531) Pulse Rate:  [58-62] 58 (03/12 0531) Resp:  [18-20] 20 (03/12 0531) BP: (164-185)/(62-73) 175/63 (03/12 0531) SpO2:  [97 %-98 %] 98 % (03/12 0531)  General - Well nourished, well developed, in no apparent distress.  Ophthalmologic - Fundi not visualized  due to noncooperation.  Cardiovascular - Regular rate and rhythm.  Mental Status -  Level of arousal and orientation to time, place, and person were intact. Language including expression, naming, repetition, comprehension was assessed and found intact. Attention span and concentration were normal. Fund of Knowledge was assessed and was intact.  Cranial Nerves II - XII - II - Visual field intact OU. III, IV, VI - Extraocular movements intact. V - Facial sensation intact bilaterally. VII - Facial movement intact bilaterally. VIII - Hearing & vestibular intact bilaterally. X - Palate elevates symmetrically. XI - Chin turning & shoulder shrug intact bilaterally. XII - Tongue protrusion intact.  Motor Strength - The patient's strength was normal in all extremities and pronator drift was absent.  Bulk was normal and fasciculations were absent.   Motor Tone - Muscle tone was assessed at the neck and appendages and was normal.  Reflexes - The patient's reflexes were 1+ in all extremities and he had no pathological reflexes.  Sensory - Light touch, temperature/pinprick were assessed and were symmetrical.    Coordination - The patient had normal movements in the hands and feet with no ataxia or dysmetria.  Tremor was absent.  Gait and Station - deferred   ASSESSMENT/PLAN [name] is a 83 y.o. male with history of hypothyroidism, previous lacunar infarcts by imaging, hypertension, benign paroxysmal positional vertigo, hyperlipidemia, history of an ophthalmic aneurysm, and history of prostate cancer presenting with expressive aphasia, vertigo, and right-sided weakness. He did not receive IV t-PA due to resolution of deficits.  TIA with aphasia and right hemiparesis   Resultant  deficit resolved  MRI - No acute intracranial infarct   MRA - moderate right A1 stenosis and severe distal left P2 stenosis.   Carotid Doppler unremarkable  2D Echo - EF 45-50%  EEG  normal  Recommend 30 day cardiac event monitoring as outpt to rule out afib  LDL - 93  HgbA1c - 5.5  VTE prophylaxis - Lovenox Diet heart healthy/carb modified Room service appropriate? Yes; Fluid consistency: Thin Diet - low sodium heart healthy  No antithrombotic prior to admission, now on aspirin 325 mg daily. Continue ASA 325 mg on discharge.  Patient counseled to be compliant with his antithrombotic medications  Ongoing aggressive stroke risk factor management  Therapy recommendations: Outpatient PT recommended.  Disposition:  Pending  Sudden right eye blurry vision  Happened 2 months ago  Treated for retinal bleeding by Dr. Zigmund Daniel   No hx of DM  CUS unremarkable, low suspicious for CRAO  ASA was on hold for the last 2 months  Now ASA resumed.  Hypertension  Blood pressure running high (Norvasc 10 mg daily prior to admission) Long-term BP goal normotensive  Hyperlipidemia  Home meds: No statin medications prior to admission  LDL 93, goal < 70  Now on Lipitor 20 mg daily  Continue statin at discharge  Other Stroke Risk Factors  Advanced age  quit smoking  more than 50 years ago.  Obesity, Body mass index is 33.1 kg/m., recommend weight loss, diet and exercise as appropriate   Family hx stroke (mother)  Previous strokes by imaging  Other Active Problems  History of benign paroxysmal positional vertigo  Hx of prostate cancer - s/p resection - on lupron and [facility] day # 1  Neurology will sign off. Please call with questions. Pt will follow up with Cecille Rubin NP at [facility] in about 6 weeks. Thanks for the consult.   Rosalin Hawking, MD PhD Stroke Neurology 04/05/2016 4:21 PM   To contact Stroke Continuity provider, please refer to [email or web address] After hours, contact General Neurology

## 2016-04-05 NOTE — Consult Note (Signed)
           [facility] CM Primary Care Navigator  04/05/2016  [name] 06-13-1932 [id number]   Went to see patientat the bedside to identify possible discharge needsbut staff reports that patient is off the unit for a procedure (Echo).  Will attempt to see patient at another time, when available in room.   For questions, please contact:  Dannielle Huh, BSN, RN- [facility] Primary Care Navigator  Telephone: [phone] [place]

## 2016-04-05 NOTE — Progress Notes (Signed)
**  Preliminary report by tech**  Carotid artery duplex complete. Findings are consistent with a 1-39 percent stenosis involving the right internal carotid artery and the left internal carotid artery. The vertebral artery demonstrates antegrade flow.  04/05/16 4:23 PM [name] RVT

## 2016-04-05 NOTE — Progress Notes (Signed)
Pt. Has had EEG and Echo; await carotid doppler for discharge, have called Vascular for imminent discharge.

## 2016-04-05 NOTE — Progress Notes (Signed)
Pt. Discharged at this time; discharge instructions completed and pt. Verbalized understanding. Discharged via wheelchair.

## 2016-04-05 NOTE — Progress Notes (Signed)
Per pt. And family dopplers completed; called [facility] to notify that results have to be posted before Dr. Erlinda Hong leaves so that pt. Can discharge.

## 2016-04-05 NOTE — Care Management Note (Signed)
Case Management Note  Patient Details  Name: [name] MRN: [id number] Date of Birth: Sep 15, 1932  Subjective/Objective:             Patient presented with intermittent RUE/RLE weakness and expressive aphasia. Lives at home with spouse. CM will follow for discharge needs pending therapy evals and physician orders.   Action/Plan:   Expected Discharge Date:                  Expected Discharge Plan:     In-House Referral:     Discharge planning Services     Post Acute Care Choice:    Choice offered to:     DME Arranged:    DME Agency:     HH Arranged:    HH Agency:     Status of Service:     If discussed at [employer] of Stay Meetings, dates discussed:    Additional Comments:  Rolm Baptise, RN 04/05/2016, 9:36 AM

## 2016-04-05 NOTE — Progress Notes (Signed)
Spoke w/ [name], NP from Neuro regarding patient being cleared to discharge. Per Neuro the pt. is not cleared to discharge as EEG, Carotid Doppler and Echo results are still pending. Notified pt.'s family as they are anxiously requesting discharge and are concerned that they will not be able to pick up the patient later due to the weather. The pt.'s wife verbalized understanding.

## 2016-04-05 NOTE — Discharge Summary (Signed)
Physician Discharge Summary  [name]:[id number] DOB: 11/20/1932 DOA: 04/03/2016  PCP: Chevis Pretty, FNP  Admit date: 04/03/2016 Discharge date: 04/05/2016  Admitted From: Home Discharge disposition: Home   Recommendations for Outpatient Follow-Up:   1. Will arrange 30 day event monitor.  2. Outpatient PT recommended. 3. Check LFTs in 4-6 weeks, discharged on statin therapy.   Discharge Diagnosis:   Principal Problem:    TIA (transient ischemic attack) Active Problems:    Hypertension    Hyperlipidemia    Hypothyroidism    H/O prostate cancer   Discharge Condition: Improved.  Diet recommendation: Low sodium, heart healthy.  C  History of Present Illness:   [name] is an 83 y.o. male the PMH of hypertension, hyperlipidemia, hypothyroidism, and prostate cancer diagnosed 1996 and treated with pubic radical prostatectomy/radiation therapy who presented to his PCP 04/03/16 for evaluation of expressive aphasia and right hemiparesis. Tums resolved after approximately 30 minutes. He has not been on aspirin for the past 2 months at the direction of his ophthalmologist due to concerns for bleeding aneurysm in his eye. He has had laser therapy for this. CT of the brain showed mode infarcts but nothing acute. MRI was also negative for acute intracranial infarct, but did show mild chronic microvascular ischemic disease and remote lacunar infarcts involving the bilateral basal ganglia and periventricular white matter. He also had moderate right A1 stenosis and severe distal left P2 stenosis.   Hospital Course by Problem:   Principal Problem:   TIA (transient ischemic attack) MRI negative for acute intracranial infarct. Neurology following. Full stroke workup completed: echocardiogram, carotid Dopplers, CTA of the neck, hemoglobin A1c, FLP, PT/OT/ST consultations. Aspirin has been restarted at 325 mg daily. Permissive hypertension 24 hours recommended by  neurologist.MRI/MRA personally reviewed:Mild to moderate intracranial atherosclerotic disease involving the anterior and posterior circulations as above. Most notable findings include a moderate right A1 stenosis and severe distal left P2 stenosis. 2 D Echo negative for an embolic source, and carotid Dopplers negative for significant stenosis. Cholesterol 181, LDL 93--- Lipitor started. Hemoglobin A1c 5.5%. Outpatient PT recommended. No further OT follow-up. Will arrange 30 day outpatient event monitor.   A  Active Problems:   Hypertension Resume Norvasc at D/C.    Hyperlipidemia Continue Lipitor.    Hypothyroidism Continue Synthroid.    H/O prostate cancer Status post prostatectomy/radiation treatment and hormone therapy. Continue Lupron and Xtandi.    Obesity (BMI 30-39.9) Body mass index is 33.1 kg/m.     H/O Retinal hemorrhage Status post laser treatment. Close follow-up with outpatient ophthalmologist at discharge.  Medical Consultants:    Neurology   Discharge Exam:   Vitals:   04/05/16 0145 04/05/16 0531  BP: (!) 185/65 (!) 175/63  Pulse: (!) 59 (!) 58  Resp: 18 20  Temp: 97.8 F (36.6 C) 97.7 F (36.5 C)   Vitals:   04/04/16 1740 04/04/16 2058 04/05/16 0145 04/05/16 0531  BP: (!) 179/62 (!) 164/73 (!) 185/65 (!) 175/63  Pulse: 62 61 (!) 59 (!) 58  Resp: '18 18 18 20  '$ Temp: 97.8 F (36.6 C) 98.4 F (36.9 C) 97.8 F (36.6 C) 97.7 F (36.5 C)  TempSrc: Oral Oral Oral Oral  SpO2: 97% 98% 98% 98%  Weight:      Height:        General exam: Appears calm and comfortable. Obese. Respiratory system: Clear to auscultation. Respiratory effort normal. Cardiovascular system: S1 & S2 heard, RRR. No JVD,  rubs, gallops  or clicks. No murmurs. Gastrointestinal system: Abdomen is nondistended, soft and nontender. No organomegaly or masses felt. Normal bowel sounds heard. Central nervous system: Alert and oriented. No focal neurological deficits. Extremities:  No clubbing,  or cyanosis. No edema. Skin: No rashes, lesions or ulcers. Psychiatry: Judgement and insight appear normal. Mood & affect appropriate.    The results of significant diagnostics from this hospitalization (including imaging, microbiology, ancillary and laboratory) are listed below for reference.     Procedures and Diagnostic Studies:   No results found.   Labs:   Basic Metabolic Panel:  Recent Labs Lab 03/30/16 1044 04/03/16 1249 04/03/16 1310  NA 138 139 138  K 5.1 4.4 4.7  CL 98 103 103  CO2 25 28  --   GLUCOSE 100* 103* 100*  BUN '12 18 19  '$ CREATININE 0.87 1.05 1.10  CALCIUM 9.9 9.9  --    GFR Estimated Creatinine Clearance: 69.7 mL/min (by C-G formula based on SCr of 1.1 mg/dL). Liver Function Tests:  Recent Labs Lab 03/30/16 1044 04/03/16 1249  AST 15 19  ALT 10 12*  ALKPHOS 55 58  BILITOT 0.5 0.5  PROT 6.8 7.3  ALBUMIN 4.3 4.3   Coagulation profile  Recent Labs Lab 04/03/16 1249  INR 1.01    CBC:  Recent Labs Lab 04/03/16 1249 04/03/16 1310  WBC 6.9  --   NEUTROABS 4.0  --   HGB 16.0 14.3  HCT 45.7 42.0  MCV 94.2  --   PLT 230  --    CBG:  Recent Labs Lab 04/03/16 1244  GLUCAP 88   Hgb A1c  Recent Labs  04/04/16 0305  HGBA1C 5.5     Discharge Instructions:   Discharge Instructions    Ambulatory referral to Physical Therapy    Complete by:  As directed    Call MD for:    Complete by:  As directed    Loss of power or weakness on one side of the body versus the other, a change in sensation, visual changes, speech or language difficulties.   Call MD for:  extreme fatigue    Complete by:  As directed    Call MD for:  persistant dizziness or light-headedness    Complete by:  As directed    Diet - low sodium heart healthy    Complete by:  As directed    Discharge instructions    Complete by:  As directed    We recommend you see your eye doctor in the next few days to re-examine your eyes to ensure the aspirin  is not causing bleeding.   Increase activity slowly    Complete by:  As directed      Allergies as of 04/05/2016      Reactions   Caffeine Palpitations   Codeine Palpitations      Medication List    TAKE these medications   amLODipine 10 MG tablet Commonly known as:  NORVASC Take 1 tablet (10 mg total) by mouth daily.   aspirin 325 MG tablet Take 1 tablet (325 mg total) by mouth daily.   atorvastatin 20 MG tablet Commonly known as:  LIPITOR Take 1 tablet (20 mg total) by mouth daily at 6 PM.   CALCIUM 600 + D PO Take 1 tablet by mouth daily after lunch.   Coenzyme Q10-Red Yeast Rice 60-600 MG Caps Take 2 capsules by mouth daily. THIS PILL ALSO HAS '100MG'$  NIACIN   diphenhydrAMINE 25 MG tablet Commonly known as:  BENADRYL Take 25 mg by mouth every 6 (six) hours as needed for itching.   enzalutamide 40 MG capsule Commonly known as:  XTANDI Take 160 mg by mouth daily.   FLAXSEED (LINSEED) PO Take 30 mLs by mouth every morning. GRINDS FLAX SEEDS AND PUTS IN 1/4 CUP OF WATER   ibuprofen 200 MG tablet Commonly known as:  ADVIL,MOTRIN Take 400 mg by mouth every 6 (six) hours as needed for moderate pain.   Levothyroxine Sodium 88 MCG Caps Take 1 capsule (88 mcg total) by mouth daily before breakfast.   LUPRON DEPOT (57-MONTH) 45 MG injection Generic drug:  Leuprolide Acetate (6 Month) Inject 45 mg into the muscle every 6 (six) months.   Magnesium 300 MG Caps Take 1 capsule by mouth daily.   PROBIOTIC DAILY PO Take 1 capsule by mouth daily.   Vitamin D3 5000 units Caps Take 1 capsule by mouth daily.            Durable Medical Equipment        Start     Ordered   04/05/16 1334  For home use only DME Walker rolling  Once    Question:  Patient needs a walker to treat with the following condition  Answer:  TIA (transient ischemic attack)   04/05/16 1334     Follow-up Information    [facility] Follow up.   Specialty:   Rehabilitation Why:  They will contact you for the first visit. Contact information: [address] [id number] [place] [employer] [place], NP. Schedule an appointment as soon as possible for a visit in 6 week(s).   Specialty:  Family Medicine Why:  stroke/TIA follow up Contact information: [address] [place] [address] [phone]            Time coordinating discharge: 35 minutes.  Signed:  Arletha Marschke  Pager [phone] [facility] 04/05/2016, 4:33 PM

## 2016-04-05 NOTE — Progress Notes (Signed)
Physical Therapy Treatment Patient Details Name: [name] MRN: [id number] DOB: 11-Aug-1932 Today's Date: 04/05/2016    History of Present Illness [name] is an 83 y.o. male who initially presented to his PCP on Saturday for expressive aphasia and RUE/RLE weakness. Episodic in nature, and typically lasts 30 minutes; His PMHx includes prostate cancer with radiation proctitis, cystitis; hypothyroidism; HTN and HLD.  At baseline he ambulates with a cane.    PT Comments    Patient able to ambulate with cane, but still mildly unsteady so encouraged to use walker at home especially getting up at night, etc.  Feel safe for d/c home with outpatient PT.  Discussed plans with pt.    Follow Up Recommendations  Outpatient PT;Supervision - Intermittent     Equipment Recommendations  Rolling walker with 5" wheels    Recommendations for Other Services       Precautions / Restrictions Precautions Precautions: Fall    Mobility  Bed Mobility Overal bed mobility: Modified Independent                Transfers Overall transfer level: Needs assistance Equipment used: None Transfers: Sit to/from Stand Sit to Stand: Supervision         General transfer comment: slow to rise with legs braced against bed; assist for safety  Ambulation/Gait Ambulation/Gait assistance: Supervision Ambulation Distance (Feet): 175 Feet Assistive device: Straight cane Gait Pattern/deviations: Step-through pattern;Decreased stride length;Trunk flexed     General Gait Details: keeps cane next to R LE due to reported he broke it three times when he was younger; mild unsteadiness, but no LOB with cane in wide open hallway   Stairs Stairs: Yes   Stair Management: One rail Left;Step to pattern;Forwards;With cane Number of Stairs: 4 General stair comments: safe and no physical help needed  Wheelchair Mobility    Modified Rankin (Stroke Patients Only) Modified Rankin (Stroke Patients  Only) Pre-Morbid Rankin Score: No significant disability Modified Rankin: No significant disability     Balance Overall balance assessment: Needs assistance   Sitting balance-Leahy Scale: Good       Standing balance-Leahy Scale: Fair Standing balance comment: stood and donned briefs from toilet with S, washes hands without UE support                    Cognition Arousal/Alertness: Awake/alert Behavior During Therapy: WFL for tasks assessed/performed Overall Cognitive Status: Within Functional Limits for tasks assessed                      Exercises      General Comments        Pertinent Vitals/Pain Pain Assessment: No/denies pain    Home Living                      Prior Function            PT Goals (current goals can now be found in the care plan section) Progress towards PT goals: Progressing toward goals    Frequency    Min 4X/week      PT Plan      Co-evaluation             End of Session Equipment Utilized During Treatment: Gait belt Activity Tolerance: Patient tolerated treatment well Patient left: in chair   PT Visit Diagnosis: Unsteadiness on feet (R26.81);Muscle weakness (generalized) (M62.81)     Time: 8546-2703 PT Time Calculation (min) (ACUTE ONLY): 25 min  Charges:  $Gait Training: 8-22 mins $Therapeutic Activity: 8-22 mins                    G Codes:       Reginia Naas 04/28/16, 12:47 PM  Magda Kiel, [place] 04-28-2016

## 2016-04-05 NOTE — Progress Notes (Signed)
Nutrition Brief Note  Nutrition Consult received for TIA.   Wt Readings from Last 15 Encounters:  04/04/16 250 lb 14.4 oz (113.8 kg)  03/30/16 255 lb (115.7 kg)  01/15/16 262 lb (118.8 kg)  12/29/15 262 lb (118.8 kg)  10/23/15 264 lb (119.7 kg)  07/18/15 261 lb 12.8 oz (118.8 kg)  06/27/15 262 lb (118.8 kg)  02/18/15 255 lb (115.7 kg)  01/16/15 254 lb (115.2 kg)  12/27/14 253 lb (114.8 kg)  06/27/14 250 lb (113.4 kg)  02/25/14 249 lb 8 oz (113.2 kg)  01/14/14 250 lb (113.4 kg)  01/07/14 251 lb (113.9 kg)  07/09/13 246 lb 6.4 oz (111.8 kg)    Body mass index is 33.1 kg/m. Patient meets criteria for Obesity based on current BMI.   Current diet order is Heart Healthy/Carb Modified, patient is consuming approximately 95-100% of meals at this time. Pt out of room at time of visit. Wife and son at bedside state that patient has a good appetite and has been eating well. Wife reports that they eat mostly at home and pt has been using less salt as instructed by MD. She states that pt has been eating better and has lost 7 or 8 lbs in the past few months. RD discussed ways to decrease sodium in the diet. Family hopes to bring pt home later today. Labs and medications reviewed.   No further nutrition interventions warranted at this time. If nutrition issues arise, please re-consult RD.   Scarlette Ar RD, LDN, CSP Inpatient Clinical Dietitian Pager: [phone] After Hours Pager: [phone]

## 2016-04-05 NOTE — Procedures (Signed)
ELECTROENCEPHALOGRAM REPORT  Date of Study: 04/05/2016  Patient's Name: [name] MRN: [id number] Date of Birth: Mar 31, 1932  Referring Provider: Dr. Rosalin Hawking  Clinical History: This is an 83 year old man with expressive aphasia and right-sided weakness.  Medications: acetaminophen (TYLENOL) tablet 650 mg  aspirin tablet 325 mg  atorvastatin (LIPITOR) tablet 20 mg  enoxaparin (LOVENOX) injection 40 mg  levothyroxine (SYNTHROID, LEVOTHROID) tablet 88 mcg  enna-docusate (Senokot-S) tablet 1 tablet   Technical Summary: A multichannel digital EEG recording measured by the international 10-20 system with electrodes applied with paste and impedances below 5000 ohms performed in our laboratory with EKG monitoring in an awake and asleep patient.  Hyperventilation and photic stimulation were not performed.  The digital EEG was referentially recorded, reformatted, and digitally filtered in a variety of bipolar and referential montages for optimal display.    Description: The patient is awake and asleep during the recording.  During maximal wakefulness, there is a symmetric, medium voltage 8 Hz posterior dominant rhythm that attenuates with eye opening.  The record is symmetric.  During drowsiness and sleep, there is an increase in theta slowing of the background.  Vertex waves and symmetric sleep spindles were seen.  Hyperventilation and photic stimulation were not performed. There were no epileptiform discharges or electrographic seizures seen.    EKG lead showed frequent extrasystolic beats.  Impression: This awake and asleep EEG is normal.    Clinical Correlation: A normal EEG does not exclude a clinical diagnosis of epilepsy. Clinical correlation is advised.   Ellouise Newer, M.D.

## 2016-04-05 NOTE — Care Management Note (Addendum)
Case Management Note  Patient Details  Name: [name] MRN: [id number] Date of Birth: March 28, 1932  Subjective/Objective:                    Action/Plan: Pt discharging home with self care. CM consulted for outpatient therapy. CM spoke with the patient and he was interested in attending rehab at [facility] in [place]. Orders in EPIC and information on the AVS.   Addendum: PT recommending walker: Santiago Glad with [facility] DME notified and will have walker delivered to the room.   Expected Discharge Date:  04/05/16               Expected Discharge Plan:  Home/Self Care  In-House Referral:     Discharge planning Services  CM Consult  Post Acute Care Choice:    Choice offered to:     DME Arranged:    DME Agency:     HH Arranged:    HH Agency:     Status of Service:  Completed, signed off  If discussed at [employer] of Stay Meetings, dates discussed:    Additional Comments:  Pollie Friar, RN 04/05/2016, 10:42 AM

## 2016-04-06 ENCOUNTER — Telehealth: Payer: Self-pay | Admitting: Neurology

## 2016-04-06 ENCOUNTER — Other Ambulatory Visit: Payer: Self-pay | Admitting: Student

## 2016-04-06 ENCOUNTER — Telehealth: Payer: Self-pay | Admitting: Nurse Practitioner

## 2016-04-06 DIAGNOSIS — R55 Syncope and collapse: Secondary | ICD-10-CM

## 2016-04-06 NOTE — Telephone Encounter (Signed)
Rn consulted Dr. Erlinda Hong via telephone about pt asking about heart monitor. Per. Dr. Erlinda Hong the cardiology dept will contact pt from the hospital concerning heart monitor.Pt was just discharge from hospital on 04/03/2016. Dr. Erlinda Hong stated pt may get a call in 5 to 7 days for the set up. Rn will call patient about the cardiac monitor.

## 2016-04-06 NOTE — Telephone Encounter (Signed)
Patient calling to find out who he is to contact to get a heart monitor.

## 2016-04-06 NOTE — Telephone Encounter (Signed)
Patient schedule for heart monitor per appts for patient for Friday.Pt was call by the cardiology office.

## 2016-04-06 NOTE — Consult Note (Signed)
            [facility] CM Primary Care Navigator  04/06/2016  JAVYON FONTAN 1932-11-13 [id number]   Went back to see patient earlier today at the bedside to identify possible discharge needs but he was already discharged home per staff.  Primary care provider's office called Caryl Pina) to notify of patient's discharge and need for post hospital follow-up and transition of care. Notified that patient had been recommended out patient PT and need to check LFTs (liver function test) in 4-6 weeks since he was discharged on statin therapy.  Made aware to refer patient to [facility] care management if deemed appropriate for services.  For additional questions please contact:  Edwena Felty A. Bowie Doiron, BSN, RN-BC [facility] PRIMARY CARE Navigator Cell: [phone]

## 2016-04-06 NOTE — Telephone Encounter (Signed)
This encounter was created in error - please disregard.

## 2016-04-06 NOTE — Telephone Encounter (Signed)
Rn tried to call patients home number. Someone pick up phone and did not say anything. Rn said hello three times and the phone disconnect. Rn will try later.

## 2016-04-07 NOTE — Telephone Encounter (Signed)
Call Completed and Appointment Scheduled: Yes, Date: 04/08/2016  with [place] INFORMATION Date of Discharge:04/05/2016  [facility]  Principal Discharge Diagnosis: TIA  Patient and/or caregiver is knowledgeable of his/her condition(s) and treatment: Yes  MEDICATION RECONCILIATION Current medication list reviewed with patient:Yes  Outpatient Encounter Prescriptions as of 04/06/2016  Medication Sig  . amLODipine (NORVASC) 10 MG tablet Take 1 tablet (10 mg total) by mouth daily.  Marland Kitchen aspirin 325 MG tablet Take 1 tablet (325 mg total) by mouth daily.  Marland Kitchen atorvastatin (LIPITOR) 20 MG tablet Take 1 tablet (20 mg total) by mouth daily at 6 PM.  . Calcium Carb-Cholecalciferol (CALCIUM 600 + D PO) Take 1 tablet by mouth daily after lunch.   . Cholecalciferol (VITAMIN D3) 5000 UNITS CAPS Take 1 capsule by mouth daily.  . Coenzyme Q10-Red Yeast Rice 60-600 MG CAPS Take 2 capsules by mouth daily. THIS PILL ALSO HAS '100MG'$  NIACIN  . diphenhydrAMINE (BENADRYL) 25 MG tablet Take 25 mg by mouth every 6 (six) hours as needed for itching.  . enzalutamide (XTANDI) 40 MG capsule Take 160 mg by mouth daily.  Marland Kitchen FLAXSEED, LINSEED, PO Take 30 mLs by mouth every morning. GRINDS FLAX SEEDS AND PUTS IN 1/4 CUP OF WATER  . ibuprofen (ADVIL,MOTRIN) 200 MG tablet Take 400 mg by mouth every 6 (six) hours as needed for moderate pain.  Marland Kitchen Leuprolide Acetate, 6 Month, (LUPRON DEPOT) 45 MG injection Inject 45 mg into the muscle every 6 (six) months.   . Levothyroxine Sodium 88 MCG CAPS Take 1 capsule (88 mcg total) by mouth daily before breakfast.  . Magnesium 300 MG CAPS Take 1 capsule by mouth daily.  . Probiotic Product (PROBIOTIC DAILY PO) Take 1 capsule by mouth daily.   No facility-administered encounter medications on file as of 04/06/2016.     Discharge Medications reviewed and reconciled with current medications.yes  Patient is able to obtain needed  medications:Yes  ACTIVITIES OF DAILY LIVING  Is the patient able to perform his/her own ADLs: Yes.    Patient is receiving home health services: No.  PATIENT EDUCATION Questions/Concerns Discussed: none at this time

## 2016-04-08 ENCOUNTER — Ambulatory Visit (INDEPENDENT_AMBULATORY_CARE_PROVIDER_SITE_OTHER): Payer: Medicare Other | Admitting: Nurse Practitioner

## 2016-04-08 ENCOUNTER — Encounter: Payer: Self-pay | Admitting: Nurse Practitioner

## 2016-04-08 VITALS — BP 132/74 | HR 68 | Temp 96.7°F | Ht 73.0 in | Wt 251.0 lb

## 2016-04-08 DIAGNOSIS — Z09 Encounter for follow-up examination after completed treatment for conditions other than malignant neoplasm: Secondary | ICD-10-CM

## 2016-04-08 DIAGNOSIS — Z8673 Personal history of transient ischemic attack (TIA), and cerebral infarction without residual deficits: Secondary | ICD-10-CM | POA: Diagnosis not present

## 2016-04-08 NOTE — Progress Notes (Signed)
   Subjective:    Patient ID: [name], male    DOB: 10/31/1932, 83 y.o.   MRN: [id number]  HPI Today's visit is for Transitional Care Management.  The patient was discharged from [facility] on 04/05/16 with a primary diagnosis of TIA.   Contact with the patient and/or caregiver, by a clinical staff member, was made on 04/07/16 and was documented as a telephone encounter within the EMR.  Through chart review and discussion with the patient I have determined that management of their condition is of moderate complexity.   Patient came into the office on Saturday with right sided weakness with aphasia. EMS called and transportation to hospital provided.  CT/MRI/EEG performed. TIA diagnosed.  Patient has no complaints at this time.   Patient now has no complaints.  Patient will begin going to therapy as suggested by the hospital upon discharge.  Patient will go tomorrow to get a holster monitor to wear for 30 days for surveillance.  Patient started on Lipitor and 325 aspirin and will continue to take these medications daily.   Review of Systems  Constitutional: Negative.  Negative for activity change and fatigue.  HENT: Negative.   Respiratory: Negative.  Negative for shortness of breath.   Cardiovascular: Negative.  Negative for chest pain.  Gastrointestinal: Negative.   Musculoskeletal: Negative.   Skin: Negative.   Neurological: Positive for dizziness (reports dizziness for the last 3-4 years). Negative for speech difficulty, weakness, numbness and headaches.       Objective:   Physical Exam  Constitutional: He is oriented to person, place, and time. He appears well-developed and well-nourished.  HENT:  Head: Normocephalic.  Right Ear: External ear normal.  Left Ear: External ear normal.  Eyes: EOM are normal. Pupils are equal, round, and reactive to light.  Neck: Normal range of motion. Neck supple.  Cardiovascular: Normal rate and regular rhythm.   Pulmonary/Chest:  Effort normal and breath sounds normal.  Abdominal: Soft. Bowel sounds are normal.  Musculoskeletal: Normal range of motion.  Neurological: He is alert and oriented to person, place, and time.  Skin: Skin is warm and dry.  Psychiatric: He has a normal mood and affect. His behavior is normal. Judgment and thought content normal.   BP 132/74   Pulse 68   Temp (!) 96.7 F (35.9 C) (Oral)   Ht '6\' 1"'$  (1.854 m)   Wt 251 lb (113.9 kg)   BMI 33.12 kg/m      Assessment & Plan:  1. Hospital discharge follow-up Patient to continue with treatment recommendations.  2. Hx of transient ischemic attack (TIA) Patient to continue taking Lipitor and aspirin daily. Patient to follow up with PT and Cards. Patient to schedule an appointment with opthalmology.    Mary-Margaret Hassell Done, FNP

## 2016-04-08 NOTE — Patient Instructions (Signed)
Transient Ischemic Attack A transient ischemic attack (TIA) is a "warning stroke" that causes stroke-like symptoms. A TIA does not cause lasting damage to the brain. The symptoms of a TIA can happen fast and do not last long. It is important to know the symptoms of a TIA and what to do. This can help prevent stroke or death. Follow these instructions at home:  Take medicines only as told by your doctor. Make sure you understand all of the instructions.  You may need to take aspirin or warfarin medicine. Warfarin needs to be taken exactly as told.  Taking too much or too little warfarin is dangerous. Blood tests must be done as often as told by your doctor. A PT blood test measures how long it takes for blood to clot. Your PT is used to calculate another value called an INR. Your PT and INR help your doctor adjust your warfarin dosage. He or she will make sure you are taking the right amount.  Food can cause problems with warfarin and affect the results of your blood tests. This is true for foods high in vitamin K. Eat the same amount of foods high in vitamin K each day. Foods high in vitamin K include spinach, kale, broccoli, cabbage, collard and turnip greens, Brussels sprouts, peas, cauliflower, seaweed, and parsley. Other foods high in vitamin K include beef and pork liver, green tea, and soybean oil. Eat the same amount of foods high in vitamin K each day. Avoid big changes in your diet. Tell your doctor before changing your diet. Talk to a food specialist (dietitian) if you have questions.  Many medicines can cause problems with warfarin and affect your PT and INR. Tell your doctor about all medicines you take. This includes vitamins and dietary pills (supplements). Do not take or stop taking any prescribed or over-the-counter medicines unless your doctor tells you to.  Warfarin can cause more bruising or bleeding. Hold pressure over any cuts for longer than normal. Talk to your doctor about other  side effects of warfarin.  Avoid sports or activities that may cause injury or bleeding.  Be careful when you shave, floss, or use sharp objects.  Avoid or drink very little alcohol while taking warfarin. Tell your doctor if you change how much alcohol you drink.  Tell your dentist and other doctors that you take warfarin before any procedures.  Follow your diet program as told, if you are given one.  Keep a healthy weight.  Stay active. Try to get at least 30 minutes of activity on all or most days.  Do not use any tobacco products, including cigarettes, chewing tobacco, or electronic cigarettes. If you need help quitting, ask your doctor.  Limit alcohol intake to no more than 1 drink per day for nonpregnant women and 2 drinks per day for men. One drink equals 12 ounces of beer, 5 ounces of wine, or 1 ounces of hard liquor.  Do not abuse drugs.  Keep your home safe so you do not fall. You can do this by:  Putting grab bars in the bedroom and bathroom.  Raising toilet seats.  Putting a seat in the shower.  Keep all follow-up visits as told by your doctor. This is important. Contact a doctor if:  Your personality changes.  You have trouble swallowing.  You have double vision.  You are dizzy.  You have a fever. Get help right away if: These symptoms may be an emergency. Do not wait to see if  the symptoms will go away. Get medical help right away. Call your local emergency services (911 in the [place] Do not drive yourself to the hospital.  You have sudden weakness or lose feeling (go numb), especially on one side of the body. This can affect your:  Face.  Arm.  Leg.  You have sudden trouble walking.  You have sudden trouble moving your arms or legs.  You have sudden confusion.  You have trouble talking.  You have trouble understanding.  You have sudden trouble seeing in one or both eyes.  You lose your balance.  Your movements are not smooth.  You  have a sudden, very bad headache with no known cause.  You have new chest pain.  Your heartbeat is unsteady.  You are partly or totally unaware of what is going on around you. This information is not intended to replace advice given to you by your health care provider. Make sure you discuss any questions you have with your health care provider. Document Released: 10/21/2007 Document Revised: 09/15/2015 Document Reviewed: 04/18/2013 Elsevier Interactive Patient Education  2017 [employer].

## 2016-04-09 ENCOUNTER — Other Ambulatory Visit: Payer: Self-pay | Admitting: Student

## 2016-04-09 ENCOUNTER — Ambulatory Visit (INDEPENDENT_AMBULATORY_CARE_PROVIDER_SITE_OTHER): Payer: Medicare Other

## 2016-04-09 DIAGNOSIS — I4891 Unspecified atrial fibrillation: Secondary | ICD-10-CM

## 2016-04-09 DIAGNOSIS — G459 Transient cerebral ischemic attack, unspecified: Secondary | ICD-10-CM

## 2016-04-13 DIAGNOSIS — Z0289 Encounter for other administrative examinations: Secondary | ICD-10-CM

## 2016-04-15 DIAGNOSIS — M79673 Pain in unspecified foot: Secondary | ICD-10-CM | POA: Diagnosis not present

## 2016-04-15 DIAGNOSIS — D2371 Other benign neoplasm of skin of right lower limb, including hip: Secondary | ICD-10-CM | POA: Diagnosis not present

## 2016-04-15 DIAGNOSIS — D2372 Other benign neoplasm of skin of left lower limb, including hip: Secondary | ICD-10-CM | POA: Diagnosis not present

## 2016-05-06 ENCOUNTER — Other Ambulatory Visit: Payer: Self-pay | Admitting: *Deleted

## 2016-05-06 MED ORDER — ATORVASTATIN CALCIUM 20 MG PO TABS: 20.0000 mg | ORAL_TABLET | Freq: Every day | ORAL | 1 refills | Status: DC

## 2016-05-25 ENCOUNTER — Ambulatory Visit (INDEPENDENT_AMBULATORY_CARE_PROVIDER_SITE_OTHER): Payer: Medicare Other | Admitting: Nurse Practitioner

## 2016-05-25 ENCOUNTER — Telehealth: Payer: Self-pay

## 2016-05-25 ENCOUNTER — Encounter: Payer: Self-pay | Admitting: Nurse Practitioner

## 2016-05-25 VITALS — BP 140/80 | HR 69 | Ht 73.0 in | Wt 250.6 lb

## 2016-05-25 DIAGNOSIS — G459 Transient cerebral ischemic attack, unspecified: Secondary | ICD-10-CM

## 2016-05-25 DIAGNOSIS — E782 Mixed hyperlipidemia: Secondary | ICD-10-CM | POA: Diagnosis not present

## 2016-05-25 DIAGNOSIS — E669 Obesity, unspecified: Secondary | ICD-10-CM

## 2016-05-25 DIAGNOSIS — I10 Essential (primary) hypertension: Secondary | ICD-10-CM

## 2016-05-25 DIAGNOSIS — I638 Other cerebral infarction: Secondary | ICD-10-CM

## 2016-05-25 NOTE — Telephone Encounter (Signed)
Rn call patient that his cardiac monitor was unremarkable, no significant irregular heart beat. Continue treatment plan.Pt verbalized understanding.

## 2016-05-25 NOTE — Telephone Encounter (Signed)
-----   Message from Rosalin Hawking, MD sent at 05/24/2016  9:26 AM EDT ----- Could you please let the patient know that the cardiac monitoring test done recently was unremarkable, no significant irregular heart beat. Please continue current treatment. Thanks.  Rosalin Hawking, MD PhD Stroke Neurology 05/24/2016 9:26 AM

## 2016-05-25 NOTE — Progress Notes (Signed)
[facility]  PATIENT: BELDON NOWLING DOB: 1932-07-04   REASON FOR VISIT: hospital followup for TIA with aphasia and right hemiparesis HISTORY FROM:patient and wife    HISTORY OF PRESENT ILLNESS: Mr. Girgis, 83 year old male was admitted to the hospital in March for expressive aphasia vertigo and right-sided weakness. He did not receive IV TPA due to resolution of deficits. He has a prior history of lacunar infarcts hypertension benign paroxysmal positional vertigo hyperlipidemia, ophthalmic aneurysm and history of prostate cancer. MRI of the brain no acute intracranial infarct. MRA moderate right A1 stenosis and severe distal left P2 stenosis. Carotid Doppler unremarkable. 2-D echo EF 45-50%. EEG was normal LDL 93. Hemoglobin A1c 5.5. Patient had been off of his aspirin prior to admission due to his ophthalmic aneurysm. He returns to the stroke clinic for follow-up. He is back on his aspirin 325 daily without further TIA events. He has no bruising and bleeding. Blood pressure in the office today 140/80. He has had 30 day event monitoring, the results are not back. He remains on Lipitor for hyperlipidemia without complaints of myalgias. He ambulates with a single-point cane and denies any falls. He quit smoking 50 years ago and does not use alcohol returns for repeat evaluation   REVIEW OF SYSTEMS: Full 14 system review of systems performed and notable only for those listed, all others are neg:  Constitutional: neg  Cardiovascular: neg Ear/Nose/Throat: neg  Skin: neg Eyes: neg Respiratory: neg Gastroitestinal: neg  Hematology/Lymphatic: neg  Endocrine: neg Musculoskeletal:neg Allergy/Immunology: neg Neurological: Dizziness, speech difficulty Psychiatric: neg Sleep : neg   ALLERGIES: Allergies  Allergen Reactions  . Caffeine Palpitations  . Codeine Palpitations    HOME MEDICATIONS: Outpatient Medications Prior to Visit  Medication Sig Dispense Refill  .  amLODipine (NORVASC) 10 MG tablet Take 1 tablet (10 mg total) by mouth daily. 90 tablet 1  . aspirin 325 MG tablet Take 1 tablet (325 mg total) by mouth daily.    Marland Kitchen atorvastatin (LIPITOR) 20 MG tablet Take 1 tablet (20 mg total) by mouth daily at 6 PM. 90 tablet 1  . Calcium Carb-Cholecalciferol (CALCIUM 600 + D PO) Take 1 tablet by mouth daily after lunch.     . Cholecalciferol (VITAMIN D3) 5000 UNITS CAPS Take 1 capsule by mouth daily.    Marland Kitchen FLAXSEED, LINSEED, PO Take 30 mLs by mouth every morning. GRINDS FLAX SEEDS AND PUTS IN 1/4 CUP OF WATER    . Leuprolide Acetate, 6 Month, (LUPRON DEPOT) 45 MG injection Inject 45 mg into the muscle every 6 (six) months.     . Levothyroxine Sodium 88 MCG CAPS Take 1 capsule (88 mcg total) by mouth daily before breakfast. 30 capsule 5  . Magnesium 300 MG CAPS Take 1 capsule by mouth daily.    . Probiotic Product (PROBIOTIC DAILY PO) Take 1 capsule by mouth daily.    . diphenhydrAMINE (BENADRYL) 25 MG tablet Take 25 mg by mouth every 6 (six) hours as needed for itching.    Marland Kitchen ibuprofen (ADVIL,MOTRIN) 200 MG tablet Take 400 mg by mouth every 6 (six) hours as needed for moderate pain.     No facility-administered medications prior to visit.     PAST MEDICAL HISTORY: Past Medical History:  Diagnosis Date  . Arthritis   . BNC (bladder neck contracture)   . BPPV (benign paroxysmal positional vertigo)   . Cataract    right  . Diverticulosis   . H/O diarrhea SECONDARY TO RADIATION THERAPY --  INTERMITANT  DIARRHEA  . Hematuria   . History of hemorrhagic cystitis    SECONDARY RADIATION THERAPY 1997  . History of prostate cancer CURRENTLY  ELEVATED PSA--  LUPRON INJECTIONS   S/P PROSTATECTOMY AND RADIATION THERAPY  1997  . Hyperlipidemia   . Hypertension   . Hypothyroidism   . Radiation cystitis   . Radiation proctitis   . Urinary incontinence   . Urinary retention   . Vitamin D deficiency     PAST SURGICAL HISTORY: Past Surgical History:    Procedure Laterality Date  . CATARACT EXTRACTION W/ INTRAOCULAR LENS IMPLANT Right   . COLONOSCOPY W/ POLYPECTOMY  2005; 08/11/2010   2005: 1 cm hyperplastic sigmoid polyp, Dr. Wynetta Emery 2012: hyperplastic splenic flexure polyp -1 cm, diverticulosis, radiation proctitis, anal stenosis  . CYSTO/ FULGERATION OF BLEEDERS/ RESECTION BLADDER NECK CONTRACTURE  07-06-2004   BNC AND RADIATION CYSTITIS  . EYE SURGERY    . KNEE ARTHROSCOPY Right   . LUMBAR DISC SURGERY  1996   L4 -- L5  . PROSTATECTOMY  1997  . SPINE SURGERY    . TRANSURETHRAL RESECTION OF BLADDER NECK N/A 06/23/2012   Procedure: TRANSURETHRAL RESECTION OF BLADDER NECK CONTRACTURE WITH GYRUS;  Surgeon: Claybon Jabs, MD;  Location: [facility];  Service: Urology;  Laterality: N/A;  . TRANSURETHRAL RESECTION OF BLADDER TUMOR WITH GYRUS (TURBT-GYRUS) N/A 06/01/2013   Procedure: TRANSURETHRAL RESECTION OF BLADDER NECK CONTRACTURE WITH GYRUS (TURBT-GYRUS) AND INJECTION OF KENALOG;  Surgeon: Claybon Jabs, MD;  Location: [facility];  Service: Urology;  Laterality: N/A;    FAMILY HISTORY: Family History  Problem Relation Age of Onset  . Prostate cancer Brother   . Kidney disease Brother   . Congestive Heart Failure Mother   . CVA Mother 32  . Pancreatitis Father   . Pneumonia Father   . Cancer Sister     breast  . Obesity Sister   . Early death Sister     appendicitis  . Cancer Sister     breast  . Alzheimer's disease Brother   . Hip fracture Brother     SOCIAL HISTORY: Social History   Social History  . Marital status: Married    Spouse name: N/A  . Number of children: N/A  . Years of education: N/A   Occupational History  . Not on file.   Social History Main Topics  . Smoking status: Former Smoker    Years: 20.00    Types: Cigarettes    Quit date: 1965  . Smokeless tobacco: Former Systems developer    Types: [place] date: 1965     Comment: SMOKED AND CHEW TOBACCO FOR 20 YRS QUIT AGE 45   . Alcohol use No  . Drug use: No  . Sexual activity: No   Other Topics Concern  . Not on file   Social History Narrative  . No narrative on file     PHYSICAL EXAM  Vitals:   05/25/16 1002  BP: (!) 140/80   Pulse: 69  Weight: 250 lb 9.6 oz (113.7 kg)  Height: '6\' 1"'$  (1.854 m)   Body mass index is 33.06 kg/m.  Generalized: Well developed, Obese male in no acute distress  Head: normocephalic and atraumatic,. Oropharynx benign  Neck: Supple, no carotid bruits  Cardiac: Regular rate rhythm, no murmur  Musculoskeletal: No deformity   Neurological examination   Mentation: Alert oriented to time, place, history taking. Attention span and concentration appropriate. Recent  and remote memory intact.  Follows all commands speech and language fluent.   Cranial nerve II-XII: .Pupils were equal round reactive to light extraocular movements were full, visual field were full on confrontational test. Facial sensation and strength were normal. hearing was intact to finger rubbing bilaterally. Uvula tongue midline. head turning and shoulder shrug were normal and symmetric.Tongue protrusion into cheek strength was normal. Motor: normal bulk and tone, full strength in the BUE, BLE, fine finger movements normal, no pronator drift. No focal weakness Sensory: normal and symmetric to light touch, pinprick, and  Vibration, in the upper and lower extremities Coordination: finger-nose-finger, heel-to-shin bilaterally, no dysmetria Reflexes: 1+ upper lower and symmetric, plantar responses were flexor bilaterally. Gait and Station: Rising up from seated position without assistance, normal stance,  moderate stride, good arm swing, smooth turning, able to perform tiptoe, and heel walking without difficulty. Tandem gait is mildly unsteady. Ambulates with a single-point cane  DIAGNOSTIC DATA (LABS, IMAGING, TESTING) - I reviewed patient records, labs, notes, testing and imaging myself where  available.  Lab Results  Component Value Date   WBC 6.9 04/03/2016   HGB 14.3 04/03/2016   HCT 42.0 04/03/2016   MCV 94.2 04/03/2016   PLT 230 04/03/2016      Component Value Date/Time   NA 138 04/03/2016 1310   NA 138 03/30/2016 1044   K 4.7 04/03/2016 1310   CL 103 04/03/2016 1310   CO2 28 04/03/2016 1249   GLUCOSE 100 (H) 04/03/2016 1310   BUN 19 04/03/2016 1310   BUN 12 03/30/2016 1044   CREATININE 1.10 04/03/2016 1310   CREATININE 0.98 07/04/2012 1518   CALCIUM 9.9 04/03/2016 1249   PROT 7.3 04/03/2016 1249   PROT 6.8 03/30/2016 1044   ALBUMIN 4.3 04/03/2016 1249   ALBUMIN 4.3 03/30/2016 1044   AST 19 04/03/2016 1249   ALT 12 (L) 04/03/2016 1249   ALKPHOS 58 04/03/2016 1249   BILITOT 0.5 04/03/2016 1249   BILITOT 0.5 03/30/2016 1044   GFRNONAA >60 04/03/2016 1249   GFRNONAA 74 07/04/2012 1518   GFRAA >60 04/03/2016 1249   GFRAA 85 07/04/2012 1518   Lab Results  Component Value Date   CHOL 181 03/30/2016   HDL 49 03/30/2016   LDLCALC 93 03/30/2016   TRIG 195 (H) 03/30/2016   CHOLHDL 3.7 03/30/2016   Lab Results  Component Value Date   HGBA1C 5.5 04/04/2016    Lab Results  Component Value Date   TSH 5.820 (H) 03/30/2016      ASSESSMENT AND PLAN  83 y.o. year old male  has a past medical history of benign paroxysmal positional vertigo);  Hyperlipidemia; Hypertension;  here for hospital follow-up for TIA with symptoms of aphasia and right hemiparesis  PLAN: Stressed the importance of management of risk factors to prevent further stroke Continue Asa '325mg'$  for secondary stroke prevention Maintain strict control of hypertension with blood pressure goal below 130/90, today's reading140/80 continue antihypertensive medications Cholesterol with LDL cholesterol less than 70, followed by primary care,  most recent 93 continue statin drugs, Lipitor Exercise by walking, slowly increase , eat healthy diet with whole grains,  fresh fruits and vegetables Use cane  for safe ambulation at risk for falls Follow up 6 months Discussed risk for recurrent stroke/ TIA and answered additional questions This was a  visit requiring 25 minutes of  medical decision making of high complexity with extensive review of history, hospital chart, counseling and answering questions for patient and wife Dicky Doe  Sandie Ano, [facility], APRN  [facility] [address] [place], [place] [address] [phone]

## 2016-05-25 NOTE — Telephone Encounter (Signed)
LEft vm for patient to call back about cardiac monitor results.

## 2016-05-25 NOTE — Patient Instructions (Signed)
Stressed the importance of management of risk factors to prevent further stroke Continue Asa for secondary stroke prevention Maintain strict control of hypertension with blood pressure goal below 130/90, today's reading140/80 continue antihypertensive medications Cholesterol with LDL cholesterol less than 70, followed by primary care,  most recent 93 continue statin drugs, Lipitor Exercise by walking, slowly increase , eat healthy diet with whole grains,  fresh fruits and vegetables Use cane for safe ambulation Follow up 6 months

## 2016-05-25 NOTE — Telephone Encounter (Signed)
Patient called office returning RN's call.  Please call °

## 2016-05-26 NOTE — Progress Notes (Signed)
I reviewed above note and agree with the assessment and plan.  Rosalin Hawking, MD PhD Stroke Neurology 05/26/2016 8:20 AM

## 2016-06-22 DIAGNOSIS — C61 Malignant neoplasm of prostate: Secondary | ICD-10-CM | POA: Diagnosis not present

## 2016-06-24 DIAGNOSIS — M1711 Unilateral primary osteoarthritis, right knee: Secondary | ICD-10-CM | POA: Diagnosis not present

## 2016-06-24 DIAGNOSIS — S83203S Other tear of unspecified meniscus, current injury, right knee, sequela: Secondary | ICD-10-CM | POA: Diagnosis not present

## 2016-06-24 DIAGNOSIS — Z4789 Encounter for other orthopedic aftercare: Secondary | ICD-10-CM | POA: Diagnosis not present

## 2016-06-28 ENCOUNTER — Encounter (INDEPENDENT_AMBULATORY_CARE_PROVIDER_SITE_OTHER): Payer: Medicare Other | Admitting: Ophthalmology

## 2016-06-28 ENCOUNTER — Other Ambulatory Visit: Payer: Self-pay | Admitting: Nurse Practitioner

## 2016-06-28 DIAGNOSIS — I10 Essential (primary) hypertension: Secondary | ICD-10-CM | POA: Diagnosis not present

## 2016-06-28 DIAGNOSIS — H4311 Vitreous hemorrhage, right eye: Secondary | ICD-10-CM | POA: Diagnosis not present

## 2016-06-28 DIAGNOSIS — H43813 Vitreous degeneration, bilateral: Secondary | ICD-10-CM

## 2016-06-28 DIAGNOSIS — H35033 Hypertensive retinopathy, bilateral: Secondary | ICD-10-CM

## 2016-06-28 DIAGNOSIS — H3509 Other intraretinal microvascular abnormalities: Secondary | ICD-10-CM

## 2016-06-29 DIAGNOSIS — R3915 Urgency of urination: Secondary | ICD-10-CM | POA: Diagnosis not present

## 2016-06-29 DIAGNOSIS — R351 Nocturia: Secondary | ICD-10-CM | POA: Diagnosis not present

## 2016-06-29 DIAGNOSIS — C61 Malignant neoplasm of prostate: Secondary | ICD-10-CM | POA: Diagnosis not present

## 2016-07-01 ENCOUNTER — Other Ambulatory Visit: Payer: Self-pay | Admitting: Nurse Practitioner

## 2016-07-13 DIAGNOSIS — M79672 Pain in left foot: Secondary | ICD-10-CM | POA: Diagnosis not present

## 2016-07-13 DIAGNOSIS — D2371 Other benign neoplasm of skin of right lower limb, including hip: Secondary | ICD-10-CM | POA: Diagnosis not present

## 2016-08-23 ENCOUNTER — Other Ambulatory Visit: Payer: Self-pay | Admitting: Nurse Practitioner

## 2016-09-07 ENCOUNTER — Other Ambulatory Visit: Payer: Self-pay | Admitting: Nurse Practitioner

## 2016-09-13 ENCOUNTER — Ambulatory Visit (INDEPENDENT_AMBULATORY_CARE_PROVIDER_SITE_OTHER): Payer: Medicare Other | Admitting: Nurse Practitioner

## 2016-09-13 ENCOUNTER — Encounter: Payer: Self-pay | Admitting: Nurse Practitioner

## 2016-09-13 VITALS — BP 168/71 | HR 67 | Temp 96.9°F

## 2016-09-13 DIAGNOSIS — R609 Edema, unspecified: Secondary | ICD-10-CM | POA: Diagnosis not present

## 2016-09-13 DIAGNOSIS — I10 Essential (primary) hypertension: Secondary | ICD-10-CM | POA: Diagnosis not present

## 2016-09-13 DIAGNOSIS — I6930 Unspecified sequelae of cerebral infarction: Secondary | ICD-10-CM

## 2016-09-13 DIAGNOSIS — R600 Localized edema: Secondary | ICD-10-CM

## 2016-09-13 MED ORDER — AMLODIPINE BESYLATE 10 MG PO TABS: 10.0000 mg | ORAL_TABLET | Freq: Every day | ORAL | 1 refills | Status: DC

## 2016-09-13 MED ORDER — FUROSEMIDE 20 MG PO TABS: 20.0000 mg | ORAL_TABLET | Freq: Every day | ORAL | 3 refills | Status: DC

## 2016-09-13 MED ORDER — V-2 HIGH COMPRESSION HOSE MISC: 0 refills | Status: DC

## 2016-09-13 MED ORDER — ATORVASTATIN CALCIUM 20 MG PO TABS: 20.0000 mg | ORAL_TABLET | Freq: Every day | ORAL | 1 refills | Status: DC

## 2016-09-13 MED ORDER — LEVOTHYROXINE SODIUM 88 MCG PO TABS: ORAL_TABLET | ORAL | 1 refills | Status: DC

## 2016-09-13 NOTE — Progress Notes (Signed)
   Subjective:    Patient ID: [name], male    DOB: 06-Jan-1933, 83 y.o.   MRN: [id number]  HPI Patient comes in today c/o bil lower ext swelling. He has noticed it in the last couple of weeks. They usually are normal in the mornings and swell throughout the day. He denies any SOB.    Review of Systems  Constitutional: Negative.   Respiratory: Positive for cough and shortness of breath.   Cardiovascular: Positive for leg swelling. Negative for chest pain and palpitations.  Gastrointestinal: Negative.   Neurological: Negative.   Psychiatric/Behavioral: Negative.   All other systems reviewed and are negative.      Objective:   Physical Exam  Constitutional: He is oriented to person, place, and time. He appears well-developed and well-nourished. No distress.  Cardiovascular: Normal rate and regular rhythm.   Pulmonary/Chest: Effort normal and breath sounds normal.  Musculoskeletal: He exhibits edema (1+ bil- rings around ankles from socks).  Neurological: He is alert and oriented to person, place, and time.  Skin: Skin is warm.  Psychiatric: He has a normal mood and affect. His behavior is normal. Judgment and thought content normal.   BP (!) 168/71   Pulse 67   Temp (!) 96.9 F (36.1 C) (Oral)       Assessment & Plan:   1. Peripheral edema    Meds ordered this encounter  Medications  . furosemide (LASIX) 20 MG tablet    Sig: Take 1 tablet (20 mg total) by mouth daily.    Dispense:  30 tablet    Refill:  3    Order Specific Question:   Supervising Provider    Answer:   VINCENT, CAROL L [[id number]]  . Elastic Bandages & Supports (V-2 HIGH COMPRESSION HOSE) MISC    Sig: Wear daily when up walking around    Dispense:  1 each    Refill:  0    20-30 lbs    Order Specific Question:   Supervising Provider    Answer:   VINCENT, CAROL L [[id number]]  . amLODipine (NORVASC) 10 MG tablet    Sig: Take 1 tablet (10 mg total) by mouth daily.    Dispense:  90 tablet    Refill:  1    Order Specific Question:   Supervising Provider    Answer:   VINCENT, CAROL L [[id number]]  . levothyroxine (SYNTHROID, LEVOTHROID) 88 MCG tablet    Sig: Take 1 Tablet by mouth once daily BEFORE BREAKFAST    Dispense:  90 tablet    Refill:  1    This prescription was filled on 08/23/2016. Any refills authorized will be placed on file.    Order Specific Question:   Supervising Provider    Answer:   Eustaquio Maize [[id number]]  . atorvastatin (LIPITOR) 20 MG tablet    Sig: Take 1 tablet (20 mg total) by mouth daily.    Dispense:  90 tablet    Refill:  1    This prescription was filled on 09/07/2016. Any refills authorized will be placed on file.    Order Specific Question:   Supervising Provider    Answer:   Eustaquio Maize [[id number]]    Orders Placed This Encounter  Procedures  . CMP14+EGFR    Elevate legs when sitting Start on lasix '20mg'$  daily  Mary-Margaret Hassell Done, FNP

## 2016-09-14 LAB — CMP14+EGFR
ALK PHOS: 57 IU/L (ref 39–117)
ALT: 16 IU/L (ref 0–44)
AST: 19 IU/L (ref 0–40)
Albumin/Globulin Ratio: 1.8 (ref 1.2–2.2)
Albumin: 4.4 g/dL (ref 3.5–4.7)
BILIRUBIN TOTAL: 0.5 mg/dL (ref 0.0–1.2)
BUN/Creatinine Ratio: 15 (ref 10–24)
BUN: 14 mg/dL (ref 8–27)
CHLORIDE: 101 mmol/L (ref 96–106)
CO2: 23 mmol/L (ref 20–29)
CREATININE: 0.92 mg/dL (ref 0.76–1.27)
Calcium: 9.5 mg/dL (ref 8.6–10.2)
GFR calc Af Amer: 89 mL/min/{1.73_m2} (ref >59)
GFR calc non Af Amer: 77 mL/min/{1.73_m2} (ref >59)
GLUCOSE: 97 mg/dL (ref 65–99)
Globulin, Total: 2.4 g/dL (ref 1.5–4.5)
Potassium: 5.1 mmol/L (ref 3.5–5.2)
Sodium: 140 mmol/L (ref 134–144)
Total Protein: 6.8 g/dL (ref 6.0–8.5)

## 2016-09-20 ENCOUNTER — Telehealth: Payer: Self-pay | Admitting: Nurse Practitioner

## 2016-09-20 NOTE — Telephone Encounter (Signed)
Mailed labs to pt

## 2016-09-30 ENCOUNTER — Ambulatory Visit (INDEPENDENT_AMBULATORY_CARE_PROVIDER_SITE_OTHER): Payer: Medicare Other | Admitting: Family Medicine

## 2016-09-30 ENCOUNTER — Ambulatory Visit: Payer: Medicare Other | Admitting: Nurse Practitioner

## 2016-09-30 ENCOUNTER — Encounter: Payer: Self-pay | Admitting: Family Medicine

## 2016-09-30 VITALS — BP 173/77 | HR 74 | Temp 96.7°F | Ht 73.0 in | Wt 244.8 lb

## 2016-09-30 DIAGNOSIS — I10 Essential (primary) hypertension: Secondary | ICD-10-CM

## 2016-09-30 DIAGNOSIS — I6930 Unspecified sequelae of cerebral infarction: Secondary | ICD-10-CM

## 2016-09-30 DIAGNOSIS — E039 Hypothyroidism, unspecified: Secondary | ICD-10-CM | POA: Diagnosis not present

## 2016-09-30 DIAGNOSIS — I639 Cerebral infarction, unspecified: Secondary | ICD-10-CM | POA: Diagnosis not present

## 2016-09-30 DIAGNOSIS — I6381 Other cerebral infarction due to occlusion or stenosis of small artery: Secondary | ICD-10-CM

## 2016-09-30 NOTE — Progress Notes (Signed)
   HPI  Patient presents today here to follow-up for chronic medical conditions.  Hypertension Patient states that his blood pressures consistently in the 130s at home. Good medication compliance with amlodipine and Lasix. Reports very good diuresis with Lasix.  Patient with history of multiple lacunar infarcts He has established with neurology. Good medication compliance with statin. He has been walking as much as tolerated and watching his diet carefully for several months. He reports 20 pound weight loss.   Hypothyroidism- asymptomatic  PMH: Smoking status noted ROS: Per HPI  Objective: BP (!) 173/77   Pulse 74   Temp (!) 96.7 F (35.9 C) (Oral)   Ht '6\' 1"'$  (1.854 m)   Wt 244 lb 12.8 oz (111 kg)   BMI 32.30 kg/m  Gen: NAD, alert, cooperative with exam HEENT: NCAT CV: RRR, good S1/S2, no murmur Resp: CTABL, no wheezes, non-labored Ext: No edema, warm Neuro: Alert and oriented, No gross deficits  Assessment and plan:  # Hypertension Elevated today, however patient states that at home it is averaging 130s inconsistent. He believes there are some whitecoat hypertension. Good medication compliance, no changes  # Hypothyroidism Previous TSH slightly elevated, repeat today Asymptomatic  # Multiple lacunar infarcts Patient with history of multiple lacunar infarcts on previous MRI We discussed titrating his Lipitor as needed to reach LDL goal less than 70. He reports weight loss so we will check this today and proceed as necessary. He has established care with neurology   Orders Placed This Encounter  Procedures  . CBC with Differential/Platelet  . CMP14+EGFR  . TSH  . LDL Cholesterol, Direct     Laroy Apple, MD [place] 09/30/2016, 10:28 AM

## 2016-09-30 NOTE — Patient Instructions (Signed)
Great to meet you!  Come back in 6 months unless you need [place] sooner.

## 2016-10-01 ENCOUNTER — Encounter: Payer: Self-pay | Admitting: Family Medicine

## 2016-10-01 LAB — CMP14+EGFR
ALK PHOS: 60 IU/L (ref 39–117)
ALT: 11 IU/L (ref 0–44)
AST: 19 IU/L (ref 0–40)
Albumin/Globulin Ratio: 1.7 (ref 1.2–2.2)
Albumin: 4.3 g/dL (ref 3.5–4.7)
BUN / CREAT RATIO: 18 (ref 10–24)
BUN: 19 mg/dL (ref 8–27)
Bilirubin Total: 0.5 mg/dL (ref 0.0–1.2)
CHLORIDE: 101 mmol/L (ref 96–106)
CO2: 23 mmol/L (ref 20–29)
Calcium: 9.9 mg/dL (ref 8.6–10.2)
Creatinine, Ser: 1.04 mg/dL (ref 0.76–1.27)
GFR calc Af Amer: 77 mL/min/{1.73_m2} (ref >59)
GFR calc non Af Amer: 67 mL/min/{1.73_m2} (ref >59)
GLUCOSE: 95 mg/dL (ref 65–99)
Globulin, Total: 2.6 g/dL (ref 1.5–4.5)
Potassium: 4.6 mmol/L (ref 3.5–5.2)
Sodium: 141 mmol/L (ref 134–144)
Total Protein: 6.9 g/dL (ref 6.0–8.5)

## 2016-10-01 LAB — CBC WITH DIFFERENTIAL/PLATELET
BASOS ABS: 0 10*3/uL (ref 0.0–0.2)
Basos: 0 %
EOS (ABSOLUTE): 0.2 10*3/uL (ref 0.0–0.4)
Eos: 3 %
Hematocrit: 43.1 % (ref 37.5–51.0)
Hemoglobin: 14.7 g/dL (ref 13.0–17.7)
Immature Grans (Abs): 0 10*3/uL (ref 0.0–0.1)
Immature Granulocytes: 0 %
LYMPHS ABS: 1.8 10*3/uL (ref 0.7–3.1)
LYMPHS: 32 %
MCH: 31.1 pg (ref 26.6–33.0)
MCHC: 34.1 g/dL (ref 31.5–35.7)
MCV: 91 fL (ref 79–97)
Monocytes Absolute: 0.7 10*3/uL (ref 0.1–0.9)
Monocytes: 12 %
NEUTROS ABS: 3 10*3/uL (ref 1.4–7.0)
Neutrophils: 53 %
PLATELETS: 239 10*3/uL (ref 150–379)
RBC: 4.72 x10E6/uL (ref 4.14–5.80)
RDW: 13.7 % (ref 12.3–15.4)
WBC: 5.6 10*3/uL (ref 3.4–10.8)

## 2016-10-01 LAB — LDL CHOLESTEROL, DIRECT: LDL Direct: 51 mg/dL (ref 0–99)

## 2016-10-01 LAB — TSH: TSH: 2.45 u[IU]/mL (ref 0.450–4.500)

## 2016-10-05 ENCOUNTER — Encounter (HOSPITAL_COMMUNITY): Payer: Self-pay | Admitting: Obstetrics and Gynecology

## 2016-10-05 ENCOUNTER — Emergency Department (HOSPITAL_COMMUNITY)
Admission: EM | Admit: 2016-10-05 | Discharge: 2016-10-06 | Disposition: A | Discharge status: Home / Self Care | Payer: Medicare Other | Attending: Emergency Medicine | Admitting: Emergency Medicine

## 2016-10-05 ENCOUNTER — Emergency Department (HOSPITAL_COMMUNITY): Payer: Medicare Other

## 2016-10-05 DIAGNOSIS — L570 Actinic keratosis: Secondary | ICD-10-CM | POA: Diagnosis not present

## 2016-10-05 DIAGNOSIS — D1800 Hemangioma unspecified site: Secondary | ICD-10-CM | POA: Diagnosis not present

## 2016-10-05 DIAGNOSIS — Z8546 Personal history of malignant neoplasm of prostate: Secondary | ICD-10-CM | POA: Insufficient documentation

## 2016-10-05 DIAGNOSIS — M62830 Muscle spasm of back: Secondary | ICD-10-CM | POA: Diagnosis not present

## 2016-10-05 DIAGNOSIS — Z87891 Personal history of nicotine dependence: Secondary | ICD-10-CM | POA: Insufficient documentation

## 2016-10-05 DIAGNOSIS — M546 Pain in thoracic spine: Secondary | ICD-10-CM | POA: Diagnosis not present

## 2016-10-05 DIAGNOSIS — I10 Essential (primary) hypertension: Secondary | ICD-10-CM | POA: Insufficient documentation

## 2016-10-05 DIAGNOSIS — M545 Low back pain, unspecified: Secondary | ICD-10-CM

## 2016-10-05 DIAGNOSIS — Z79899 Other long term (current) drug therapy: Secondary | ICD-10-CM | POA: Diagnosis not present

## 2016-10-05 DIAGNOSIS — L821 Other seborrheic keratosis: Secondary | ICD-10-CM | POA: Diagnosis not present

## 2016-10-05 DIAGNOSIS — Z7982 Long term (current) use of aspirin: Secondary | ICD-10-CM | POA: Insufficient documentation

## 2016-10-05 DIAGNOSIS — R52 Pain, unspecified: Secondary | ICD-10-CM | POA: Diagnosis not present

## 2016-10-05 LAB — URINALYSIS, ROUTINE W REFLEX MICROSCOPIC
Bilirubin Urine: NEGATIVE
GLUCOSE, UA: NEGATIVE mg/dL
Hgb urine dipstick: NEGATIVE
KETONES UR: NEGATIVE mg/dL
LEUKOCYTES UA: NEGATIVE
NITRITE: NEGATIVE
PROTEIN: NEGATIVE mg/dL
Specific Gravity, Urine: 1.002 — ABNORMAL LOW (ref 1.005–1.030)
pH: 7 (ref 5.0–8.0)

## 2016-10-05 MED ORDER — OXYCODONE-ACETAMINOPHEN 5-325 MG PO TABS
1.0000 | ORAL_TABLET | Freq: Once | ORAL | Status: AC
  Administered 2016-10-05: 1 via ORAL
  Filled 2016-10-05: qty 1

## 2016-10-05 MED ORDER — ONDANSETRON 4 MG PO TBDP
4.0000 mg | ORAL_TABLET | Freq: Once | ORAL | Status: AC
  Administered 2016-10-05: 4 mg via ORAL
  Filled 2016-10-05: qty 1

## 2016-10-05 NOTE — ED Triage Notes (Signed)
Per EMS: Pt is coming from home. Pt has lower back pain going down to his right leg. Pt reports it is hurting at a 10/10. Pt also reports swelling to his feet.  Last Vitals: BP: 158/82 HR: 69 O2: 96% on RA RR 16 CBG 110

## 2016-10-05 NOTE — ED Notes (Signed)
Bed: WA01 Expected date:  Expected time:  Means of arrival:  Comments: Back pain

## 2016-10-05 NOTE — ED Provider Notes (Signed)
TIME SEEN: 11:23 PM  CHIEF COMPLAINT: back pain  HPI: patient is an 83 year old male with previous history of prostate cancer status post radiation therapy, arthritis who presents the emergency department with back pain that started today. Pain is worse with movement and he feels like it is a "spasm" and catches him when he tries to move. He has previously had 2 lumbar disc surgeries many years ago. No other recent lumbar surgery, epidural injection. No fever. Not on antiplatelets or anticoagulants. Denies numbness, weakness. Has chronic urinary incontinence after his prostate cancer and he had a bladder neck contracture. No fecal incontinence. No urinary retention. No known injury to the back. No history of kidney stones.  ROS: See HPI Constitutional: no fever  Eyes: no drainage  ENT: no runny nose   Cardiovascular:  no chest pain  Resp: no SOB  GI: no vomiting GU: no dysuria Integumentary: no rash  Allergy: no hives  Musculoskeletal: no leg swelling  Neurological: no slurred speech ROS otherwise negative  PAST MEDICAL HISTORY/PAST SURGICAL HISTORY:  Past Medical History:  Diagnosis Date  . Arthritis   . BNC (bladder neck contracture)   . BPPV (benign paroxysmal positional vertigo)   . Cataract    right  . Diverticulosis   . H/O diarrhea SECONDARY TO RADIATION THERAPY --  INTERMITANT  DIARRHEA  . Hematuria   . History of hemorrhagic cystitis    SECONDARY RADIATION THERAPY 1997  . History of prostate cancer CURRENTLY  ELEVATED PSA--  LUPRON INJECTIONS   S/P PROSTATECTOMY AND RADIATION THERAPY  1997  . Hyperlipidemia   . Hypertension   . Hypothyroidism   . Radiation cystitis   . Radiation proctitis   . Urinary incontinence   . Urinary retention   . Vitamin D deficiency     MEDICATIONS:  Prior to Admission medications   Medication Sig Start Date End Date Taking? Authorizing Provider  amLODipine (NORVASC) 10 MG tablet Take 1 tablet (10 mg total) by mouth daily. 09/13/16    Hassell Done Mary-Margaret, FNP  aspirin 325 MG tablet Take 1 tablet (325 mg total) by mouth daily. 04/05/16   Rama, Venetia Maxon, MD  atorvastatin (LIPITOR) 20 MG tablet Take 1 tablet (20 mg total) by mouth daily. 09/13/16   Hassell Done, Mary-Margaret, FNP  Calcium Carb-Cholecalciferol (CALCIUM 600 + D PO) Take 1 tablet by mouth daily after lunch.     [provider]  Cholecalciferol (VITAMIN D3) 5000 UNITS CAPS Take 1 capsule by mouth daily.    [provider]  Elastic Bandages & Supports (V-2 HIGH COMPRESSION HOSE) MISC Wear daily when up walking around Patient not taking: Reported on 09/30/2016 09/13/16   Hassell Done, Mary-Margaret, FNP  FLAXSEED, LINSEED, PO Take 30 mLs by mouth every morning. GRINDS FLAX SEEDS AND PUTS IN 1/4 CUP OF WATER    [provider]  furosemide (LASIX) 20 MG tablet Take 1 tablet (20 mg total) by mouth daily. 09/13/16   Hassell Done Mary-Margaret, FNP  Leuprolide Acetate, 6 Month, (LUPRON DEPOT) 45 MG injection Inject 45 mg into the muscle every 6 (six) months.     [provider]  levothyroxine (SYNTHROID, LEVOTHROID) 88 MCG tablet Take 1 Tablet by mouth once daily BEFORE BREAKFAST 09/13/16   Hassell Done, Mary-Margaret, FNP  Magnesium 300 MG CAPS Take 1 capsule by mouth daily.    [provider]  Probiotic Product (PROBIOTIC DAILY PO) Take 1 capsule by mouth daily.    [provider]    ALLERGIES:  Allergies  Allergen  Reactions  . Caffeine Palpitations  . Codeine Palpitations    SOCIAL HISTORY:  Social History  Substance Use Topics  . Smoking status: Former Smoker    Years: 20.00    Types: Cigarettes    Quit date: 1965  . Smokeless tobacco: Former Systems developer    Types: [place] date: 1965     Comment: SMOKED AND CHEW TOBACCO FOR 20 YRS QUIT AGE 75  . Alcohol use No    FAMILY HISTORY: Family History  Problem Relation Age of Onset  . Prostate cancer Brother   . Kidney disease Brother   . Congestive Heart Failure Mother   . CVA  Mother 60  . Pancreatitis Father   . Pneumonia Father   . Cancer Sister        breast  . Obesity Sister   . Early death Sister        appendicitis  . Cancer Sister        breast  . Alzheimer's disease Brother   . Hip fracture Brother     EXAM: BP (!) 162/74 (BP Location: Right Arm)   Pulse 67   Temp 98.2 F (36.8 C) (Oral)   Resp 14   Ht 6\' 1"  (1.854 m)   Wt 109.8 kg (242 lb)   SpO2 95%   BMI 31.93 kg/m  CONSTITUTIONAL: Alert and oriented and responds appropriately to questions. Well-appearing; well-nourished, elderly, in no significant distress, afebrile HEAD: Normocephalic EYES: Conjunctivae clear, pupils appear equal, EOMI ENT: normal nose; moist mucous membranes NECK: Supple, no meningismus, no nuchal rigidity, no LAD  CARD: RRR; S1 and S2 appreciated; no murmurs, no clicks, no rubs, no gallops RESP: Normal chest excursion without splinting or tachypnea; breath sounds clear and equal bilaterally; no wheezes, no rhonchi, no rales, no hypoxia or respiratory distress, speaking full sentences ABD/GI: Normal bowel sounds; non-distended; soft, non-tender, no rebound, no guarding, no peritoneal signs, no hepatosplenomegaly BACK:  The back appears normal and is tender over the lower lumbar paraspinal musculature without erythema, warmth, rash or other abnormality. There is no midline spinal tenderness or step-off or deformity, there is no CVA tenderness EXT: Normal ROM in all joints; non-tender to palpation; no edema; normal capillary refill; no cyanosis, no calf tenderness or swelling    SKIN: Normal color for age and race; warm; no rash NEURO: Moves all extremities equally, strength 5/5 in all 4 extremities, 2+ deep tendon reflexes in bilateral upper and lower extremity, no clonus, no saddle anesthesia, normal sensation diffusely PSYCH: The patient's mood and manner are appropriate. Grooming and personal hygiene are appropriate.  MEDICAL DECISION MAKING: patient here with back  pain. Seems to be more tender over the paraspinal muscles. Suspect muscle spasm. Given he is elderly, will obtain an x-ray of the lumbar spine. He has no red flag symptoms at this time to suggest cauda equina, spinal stenosis, epidural abscess or hematoma, discitis, transverse myelitis. We'll also check a urinalysis below suspicion for UTI, palate nephritis or kidney stone. We will give him Percocet for pain control and reassess.  ED PROGRESS: patient's x-ray shows no acute abnormality. There is no metastatic lesion, compression fracture. He has degenerative changes. His urine shows no blood or sign of infection. He feels better after Percocet and has been able to angulate with a walker which is what he uses at baseline. I feel he is safe to be discharged home. Family comfortable with this plan and they will follow-up with her primary care provider.  We will discharge him with Percocet for pain control at home.  At this time, I do not feel there is any life-threatening condition present. I have reviewed and discussed all results (EKG, imaging, lab, urine as appropriate) and exam findings with patient/family. I have reviewed nursing notes and appropriate previous records.  I feel the patient is safe to be discharged home without further emergent workup and can continue workup as an outpatient as needed. Discussed usual and customary return precautions. Patient/family verbalize understanding and are comfortable with this plan.  Outpatient follow-up has been provided if needed. All questions have been answered.      Shedrick Sarli, Delice Bison, DO 10/06/16 [phone]

## 2016-10-06 DIAGNOSIS — M545 Low back pain: Secondary | ICD-10-CM | POA: Diagnosis not present

## 2016-10-06 DIAGNOSIS — M549 Dorsalgia, unspecified: Secondary | ICD-10-CM | POA: Diagnosis not present

## 2016-10-06 DIAGNOSIS — I10 Essential (primary) hypertension: Secondary | ICD-10-CM | POA: Diagnosis not present

## 2016-10-06 MED ORDER — ONDANSETRON 4 MG PO TBDP: 4.0000 mg | ORAL_TABLET | Freq: Three times a day (TID) | ORAL | 0 refills | Status: DC | PRN

## 2016-10-06 MED ORDER — DOCUSATE SODIUM 100 MG PO CAPS: 100.0000 mg | ORAL_CAPSULE | Freq: Two times a day (BID) | ORAL | 0 refills | Status: DC

## 2016-10-06 MED ORDER — OXYCODONE-ACETAMINOPHEN 5-325 MG PO TABS: 1.0000 | ORAL_TABLET | Freq: Four times a day (QID) | ORAL | 0 refills | Status: DC | PRN

## 2016-10-06 MED ORDER — OXYCODONE-ACETAMINOPHEN 5-325 MG PO TABS
1.0000 | ORAL_TABLET | Freq: Once | ORAL | Status: AC
  Administered 2016-10-06: 1 via ORAL
  Filled 2016-10-06: qty 1

## 2016-10-06 NOTE — ED Notes (Signed)
[facility] called  

## 2016-10-06 NOTE — ED Notes (Signed)
Ambulated no assist with walker. Reports that he does have a walker at home

## 2016-10-12 DIAGNOSIS — D2371 Other benign neoplasm of skin of right lower limb, including hip: Secondary | ICD-10-CM | POA: Diagnosis not present

## 2016-10-12 DIAGNOSIS — M79671 Pain in right foot: Secondary | ICD-10-CM | POA: Diagnosis not present

## 2016-10-15 ENCOUNTER — Ambulatory Visit (INDEPENDENT_AMBULATORY_CARE_PROVIDER_SITE_OTHER): Payer: Medicare Other | Admitting: Family Medicine

## 2016-10-15 ENCOUNTER — Encounter: Payer: Self-pay | Admitting: Family Medicine

## 2016-10-15 ENCOUNTER — Encounter: Payer: Self-pay | Admitting: *Deleted

## 2016-10-15 VITALS — BP 147/66 | HR 72 | Temp 97.5°F | Ht 73.0 in | Wt 246.2 lb

## 2016-10-15 DIAGNOSIS — M545 Low back pain, unspecified: Secondary | ICD-10-CM

## 2016-10-15 DIAGNOSIS — I639 Cerebral infarction, unspecified: Secondary | ICD-10-CM | POA: Diagnosis not present

## 2016-10-15 NOTE — Progress Notes (Signed)
   HPI  Patient presents today here for follow-up back pain.  Patient states he feels much better. X-rays were collect to the emergency room showing spondylosis at L4-S1. Patient states that he did well with Percocet and began to feel better about 2 days later. He stopped taking medication today. He is 3-1/2 Percocet left.  He does have a history of prostate cancer, prostate 20 years ago. He denies any sciatica symptoms or leg symptoms.   PMH: Smoking status noted ROS: Per HPI  Objective: BP (!) 147/66   Pulse 72   Temp (!) 97.5 F (36.4 C) (Oral)   Ht 6\' 1"  (1.854 m)   Wt 246 lb 3.2 oz (111.7 kg)   BMI 32.48 kg/m  Gen: NAD, alert, cooperative with exam HEENT: NCAT CV: RRR, good S1/S2, no murmur Resp: CTABL, no wheezes, non-labored Ext: No edema, warm Neuro: Alert and oriented, strength 5/5 and sensation intact in bilateral lower extremities, 1+ symmetric patellar tendon reflexes MSK Mild tenderness to palpation of left-sided lower back and midline spine in the lumbar area. Negative modified straight leg  Assessment and plan:  # Low back pain Improved, almost her salt Patient has spondylolysis on x-ray, discussed usual course of illness for this. Recommended physical therapy Patient has stopped taking narcotics.     Orders Placed This Encounter  Procedures  . Ambulatory referral to Physical Therapy    Referral Priority:   Routine    Referral Type:   Physical Medicine    Referral Reason:   Specialty Services Required    Requested Specialty:   Physical Therapy    Number of Visits Requested:   [place], MD [facility] 10/15/2016, 5:18 PM

## 2016-10-20 ENCOUNTER — Other Ambulatory Visit: Payer: Self-pay

## 2016-10-20 ENCOUNTER — Ambulatory Visit: Payer: Medicare Other | Attending: Family Medicine | Admitting: Physical Therapy

## 2016-10-20 DIAGNOSIS — M545 Low back pain, unspecified: Secondary | ICD-10-CM

## 2016-10-20 DIAGNOSIS — R293 Abnormal posture: Secondary | ICD-10-CM | POA: Diagnosis not present

## 2016-10-20 NOTE — Therapy (Signed)
[facility] [place], [place], [address] Phone: [phone]   Fax:  [phone]  Physical Therapy Evaluation  Patient Details  Name: [name] MRN: [id number] Date of Birth: 04-05-1932 Referring Provider: Kenn File MD.  Encounter Date: 10/20/2016      PT End of Session - 10/20/16 1247    Visit Number 1   Number of Visits 16   Date for PT Re-Evaluation 12/19/16   PT Start Time 1115   PT Stop Time 1207   PT Time Calculation (min) 52 min   Activity Tolerance Patient tolerated treatment well   Behavior During Therapy [facility] for tasks assessed/performed      Past Medical History:  Diagnosis Date  . Arthritis   . BNC (bladder neck contracture)   . BPPV (benign paroxysmal positional vertigo)   . Cataract    right  . Diverticulosis   . H/O diarrhea SECONDARY TO RADIATION THERAPY --  INTERMITANT  DIARRHEA  . Hematuria   . History of hemorrhagic cystitis    SECONDARY RADIATION THERAPY 1997  . History of prostate cancer CURRENTLY  ELEVATED PSA--  LUPRON INJECTIONS   S/P PROSTATECTOMY AND RADIATION THERAPY  1997  . Hyperlipidemia   . Hypertension   . Hypothyroidism   . Radiation cystitis   . Radiation proctitis   . Urinary incontinence   . Urinary retention   . Vitamin D deficiency     Past Surgical History:  Procedure Laterality Date  . CATARACT EXTRACTION W/ INTRAOCULAR LENS IMPLANT Right   . COLONOSCOPY W/ POLYPECTOMY  2005; 08/11/2010   2005: 1 cm hyperplastic sigmoid polyp, Dr. Wynetta Emery 2012: hyperplastic splenic flexure polyp -1 cm, diverticulosis, radiation proctitis, anal stenosis  . CYSTO/ FULGERATION OF BLEEDERS/ RESECTION BLADDER NECK CONTRACTURE  07-06-2004   BNC AND RADIATION CYSTITIS  . EYE SURGERY    . KNEE ARTHROSCOPY Right   . LUMBAR DISC SURGERY  1996   L4 -- L5  . PROSTATECTOMY  1997  . SPINE SURGERY    . TRANSURETHRAL RESECTION OF BLADDER NECK N/A 06/23/2012   Procedure: TRANSURETHRAL  RESECTION OF BLADDER NECK CONTRACTURE WITH GYRUS;  Surgeon: Claybon Jabs, MD;  Location: [facility];  Service: Urology;  Laterality: N/A;  . TRANSURETHRAL RESECTION OF BLADDER TUMOR WITH GYRUS (TURBT-GYRUS) N/A 06/01/2013   Procedure: TRANSURETHRAL RESECTION OF BLADDER NECK CONTRACTURE WITH GYRUS (TURBT-GYRUS) AND INJECTION OF KENALOG;  Surgeon: Claybon Jabs, MD;  Location: [facility];  Service: Urology;  Laterality: N/A;    There were no vitals filed for this visit.       Subjective Assessment - 10/20/16 1248    Subjective The patient reports that he was shelling peas a couple of weeks ago and got up and was in severe pain she that he went to an ED.  He rates his pain at a 6/10 today.  Movement makes his back feel better  and sitting for prolonged periods of time increases his pain.   Pertinent History Lumbar surgery.   Limitations Sitting   How long can you sit comfortably? 10 minutes.   Patient Stated Goals Get back to where I was with less pain in my low back.   Currently in Pain? Yes   Pain Score 6    Pain Location Back   Pain Orientation Left;Lower   Pain Descriptors / Indicators Aching;Spasm   Pain Type Acute pain   Pain Onset 1 to 4 weeks ago   Pain Frequency  Constant   Aggravating Factors  See above.   Pain Relieving Factors See above.            [facility] PT Assessment - 10/20/16 0001      Assessment   Medical Diagnosis Bilateral low back pain.   Referring Provider Kenn File MD.   Onset Date/Surgical Date --  2 weeks.     Precautions   Precautions None     Restrictions   Weight Bearing Restrictions No     Balance Screen   Has the patient fallen in the past 6 months No   Has the patient had a decrease in activity level because of a fear of falling?  No   Is the patient reluctant to leave their home because of a fear of falling?  No     Home Environment   Living Environment Private residence     Prior Function   Level  of Independence Independent     Posture/Postural Control   Posture/Postural Control Postural limitations   Postural Limitations Rounded Shoulders;Forward head;Decreased lumbar lordosis;Increased thoracic kyphosis;Flexed trunk  Patient stands in 25 degrees of spinal flexion.     ROM / Strength   AROM / PROM / Strength AROM;Strength     AROM   Overall AROM Comments Bilateral hip flexion is WFL.  -5 degrees of right knee extension due to multiple traumas years ago.  lumbar flexion limited by 50% and lumbar extension -10 degrees from neutral.       Strength   Overall Strength Comments Bilateral hip strength= 4-/5; bilateral knee and ankle strength is normal.     Palpation   Palpation comment Tender to pal;pation over left SIJ region.     Special Tests    Special Tests Lumbar;Leg LengthTest  Absent LE DTR's.   Lumbar Tests --  (-) SLR and FABER testing.   Leg length test  --  (=) leg lengths.     Transfers   Transfers --  Slow and purposeful but independent.     Ambulation/Gait   Gait Pattern Trunk flexed   Gait Comments Patient ambulates with decreased cadence using a straight cane.            Objective measurements completed on examination: See above findings.          [facility] Adult PT Treatment/Exercise - 10/20/16 0001      Modalities   Modalities Electrical Stimulation;Moist Heat     Moist Heat Therapy   Number Minutes Moist Heat 20 Minutes   Moist Heat Location --  Left low back/SIJ.     Acupuncturist Location Left SIJ.   Electrical Stimulation Action Pre-mod.   Electrical Stimulation Parameters 80-150 Hz x 20 minutes.   Electrical Stimulation Goals Pain                  PT Short Term Goals - 10/20/16 1402      PT SHORT TERM GOAL #1   Title STG's=LTG's.           PT Long Term Goals - 10/20/16 1402      PT LONG TERM GOAL #1   Title Independent with a HEP.   Time 8   Period Weeks   Status New      PT LONG TERM GOAL #2   Title Achieve 10 degrees of lumbar extension.   Time 8   Period Weeks   Status New     PT LONG TERM GOAL #3  Title Perform ADL's with pain not > 3/10.   Time 8   Period Weeks   Status New     PT LONG TERM GOAL #4   Title Sit 30 minutes with pain not > 3-4/10.   Time 8   Period Weeks   Status New                Plan - 10/20/16 1356    Clinical Impression Statement The patient presents to [facility] with a severe onset of left sided low back pain after sgetting up from shelling peas.  He is tender over his left SIJ region.  He cannot achieve an upright posture at this time.  His pain and deficits are impairing his functional mobility.  Patient will benefit from skilled physical therapy.   History and Personal Factors relevant to plan of care: Lumbar surgery.   Clinical Presentation Stable   Clinical Presentation due to: Pain less than onset.   Clinical Decision Making Low   Rehab Potential Good   PT Frequency 2x / week   PT Duration 8 weeks   PT Treatment/Interventions ADLs/Self Care Home Management;Cryotherapy;Electrical Stimulation;Therapeutic activities;Therapeutic exercise;Patient/family education;Manual techniques;Moist Heat;Ultrasound   PT Next Visit Plan STW/M to left SIJ region; modalities PRN; exercises to improve posture.   Consulted and Agree with Plan of Care Patient      Patient will benefit from skilled therapeutic intervention in order to improve the following deficits and impairments:  Abnormal gait, Decreased activity tolerance, Decreased range of motion, Decreased mobility, Postural dysfunction, Pain  Visit Diagnosis: Acute left-sided low back pain without sciatica - Plan: PT plan of care cert/re-cert  Abnormal posture - Plan: PT plan of care cert/re-cert      G-Codes - 24/23/53 1245    Functional Assessment Tool Used (Outpatient Only) FOTO.Marland [email or web address]% limitation.   Functional Limitation Mobility: Walking and moving around    Mobility: Walking and Moving Around Current Status [phone]) At least 20 percent but less than 40 percent impaired, limited or restricted   Mobility: Walking and Moving Around Goal Status [phone]) At least 1 percent but less than 20 percent impaired, limited or restricted       Problem List Patient Active Problem List   Diagnosis Date Noted  . Multiple lacunar infarcts ([place]) 09/30/2016  . Aphasia   . TIA (transient ischemic attack) 04/03/2016  . Metabolic syndrome 86/76/1950  . Osteopenia of neck of left femur 01/15/2016  . H/O prostate cancer 06/27/2015  . Obesity (BMI 30-39.9) 12/27/2014  . Hypertension 07/04/2012  . Hyperlipidemia 07/04/2012  . Hypothyroidism 07/04/2012  . Bladder neck contracture 06/22/2012  . Radiation proctitis 08/11/2010  . Anal stenosis 08/11/2010  . Personal history of colonic polyps 08/11/2010    Valmai Vandenberghe, [place] MPT 10/20/2016, 2:11 PM  [facility] [address] [place], [place], [address] Phone: [phone]   Fax:  [phone]  Name: [name] MRN: [id number] Date of Birth: 08-Dec-1932

## 2016-10-26 ENCOUNTER — Ambulatory Visit: Payer: Medicare Other | Attending: Family Medicine | Admitting: *Deleted

## 2016-10-26 DIAGNOSIS — M545 Low back pain, unspecified: Secondary | ICD-10-CM

## 2016-10-26 DIAGNOSIS — R293 Abnormal posture: Secondary | ICD-10-CM

## 2016-10-26 NOTE — Therapy (Signed)
[facility] [place], [place], [address] Phone: [phone]   Fax:  [phone]  Physical Therapy Treatment  Patient Details  Name: [name] MRN: [id number] Date of Birth: 1932-06-25 Referring Provider: Kenn File MD.  Encounter Date: 10/26/2016      PT End of Session - 10/26/16 0902    Visit Number 2   Number of Visits 16   Date for PT Re-Evaluation 12/19/16   PT Start Time 0900   PT Stop Time 0950   PT Time Calculation (min) 50 min      Past Medical History:  Diagnosis Date  . Arthritis   . BNC (bladder neck contracture)   . BPPV (benign paroxysmal positional vertigo)   . Cataract    right  . Diverticulosis   . H/O diarrhea SECONDARY TO RADIATION THERAPY --  INTERMITANT  DIARRHEA  . Hematuria   . History of hemorrhagic cystitis    SECONDARY RADIATION THERAPY 1997  . History of prostate cancer CURRENTLY  ELEVATED PSA--  LUPRON INJECTIONS   S/P PROSTATECTOMY AND RADIATION THERAPY  1997  . Hyperlipidemia   . Hypertension   . Hypothyroidism   . Radiation cystitis   . Radiation proctitis   . Urinary incontinence   . Urinary retention   . Vitamin D deficiency     Past Surgical History:  Procedure Laterality Date  . CATARACT EXTRACTION W/ INTRAOCULAR LENS IMPLANT Right   . COLONOSCOPY W/ POLYPECTOMY  2005; 08/11/2010   2005: 1 cm hyperplastic sigmoid polyp, Dr. Wynetta Emery 2012: hyperplastic splenic flexure polyp -1 cm, diverticulosis, radiation proctitis, anal stenosis  . CYSTO/ FULGERATION OF BLEEDERS/ RESECTION BLADDER NECK CONTRACTURE  07-06-2004   BNC AND RADIATION CYSTITIS  . EYE SURGERY    . KNEE ARTHROSCOPY Right   . LUMBAR DISC SURGERY  1996   L4 -- L5  . PROSTATECTOMY  1997  . SPINE SURGERY    . TRANSURETHRAL RESECTION OF BLADDER NECK N/A 06/23/2012   Procedure: TRANSURETHRAL RESECTION OF BLADDER NECK CONTRACTURE WITH GYRUS;  Surgeon: Claybon Jabs, MD;  Location: [facility];  Service: Urology;  Laterality: N/A;  . TRANSURETHRAL RESECTION OF BLADDER TUMOR WITH GYRUS (TURBT-GYRUS) N/A 06/01/2013   Procedure: TRANSURETHRAL RESECTION OF BLADDER NECK CONTRACTURE WITH GYRUS (TURBT-GYRUS) AND INJECTION OF KENALOG;  Surgeon: Claybon Jabs, MD;  Location: [facility];  Service: Urology;  Laterality: N/A;    There were no vitals filed for this visit.      Subjective Assessment - 10/26/16 0901    Subjective The patient reports that he was shelling peas a couple of weeks ago and got up and was in severe pain she that he went to an ED.  He rates his pain at a 6/10 today.  Movement makes his back feel better  and sitting for prolonged periods of time increases his pain.                         [place] Adult PT Treatment/Exercise - 10/26/16 0001      Exercises   Exercises Lumbar     Lumbar Exercises: Stretches   Standing Extension --  10 reps hold 2-3 seconds     Modalities   Modalities Electrical Stimulation;Moist Heat;Ultrasound     Moist Heat Therapy   Number Minutes Moist Heat 15 Minutes   Moist Heat Location Elbow     Electrical Stimulation   Electrical Stimulation Location Left SIJ.  and LB paras premod 80-150hz  x 15 mins   Electrical Stimulation Goals Pain     Ultrasound   Ultrasound Location LT SIJ /LB paras   Ultrasound Parameters 1.5 w/cm2 x 10 mins  RT sidelying   Ultrasound Goals Pain     Manual Therapy   Manual Therapy Soft tissue mobilization                PT Education - 10/26/16 1332    Education provided Yes   Education Details EIS   Person(s) Educated Patient;Spouse   Methods Explanation;Demonstration;Handout   Comprehension Verbalized understanding;Returned demonstration          PT Short Term Goals - 10/20/16 1402      PT SHORT TERM GOAL #1   Title STG's=LTG's.           PT Long Term Goals - 10/20/16 1402      PT LONG TERM GOAL #1   Title Independent with a HEP.   Time 8    Period Weeks   Status New     PT LONG TERM GOAL #2   Title Achieve 10 degrees of lumbar extension.   Time 8   Period Weeks   Status New     PT LONG TERM GOAL #3   Title Perform ADL's with pain not > 3/10.   Time 8   Period Weeks   Status New     PT LONG TERM GOAL #4   Title Sit 30 minutes with pain not > 3-4/10.   Time 8   Period Weeks   Status New               Plan - 10/26/16 6579    Clinical Impression Statement Pt presents today doing fairly well and is ambulating with a SPC with flexed posture. He was instructed in EIS for HEP and did well. Rx focused [place] and STW to LT side SIJ and LB pras with Pt in RT sidelying.. Normal response with heat and Estim.   Rehab Potential Good   PT Frequency 2x / week   PT Duration 8 weeks   PT Treatment/Interventions ADLs/Self Care Home Management;Cryotherapy;Electrical Stimulation;Therapeutic activities;Therapeutic exercise;Patient/family education;Manual techniques;Moist Heat;Ultrasound   PT Next Visit Plan STW/M to left SIJ region; modalities PRN; exercises to improve posture.   Consulted and Agree with Plan of Care Patient      Patient will benefit from skilled therapeutic intervention in order to improve the following deficits and impairments:  Abnormal gait, Decreased activity tolerance, Decreased range of motion, Decreased mobility, Postural dysfunction, Pain  Visit Diagnosis: Acute left-sided low back pain without sciatica  Abnormal posture     Problem List Patient Active Problem List   Diagnosis Date Noted  . Multiple lacunar infarcts 09/30/2016  . Aphasia   . TIA (transient ischemic attack) 04/03/2016  . Metabolic syndrome 03/83/3383  . Osteopenia of neck of left femur 01/15/2016  . H/O prostate cancer 06/27/2015  . Obesity (BMI 30-39.9) 12/27/2014  . Hypertension 07/04/2012  . Hyperlipidemia 07/04/2012  . Hypothyroidism 07/04/2012  . Bladder neck contracture 06/22/2012  . Radiation proctitis 08/11/2010   . Anal stenosis 08/11/2010  . Personal history of colonic polyps 08/11/2010    RAMSEUR,CHRIS, PTA 10/26/2016, 1:38 PM  [facility] [address] [place], [place], [address] Phone: [phone]   Fax:  [phone]  Name: [name] MRN: [id number] Date of Birth: 1932-03-03

## 2016-10-26 NOTE — Patient Instructions (Addendum)
.      EIS x10 3-4x daily  Start by standing and place your hands on your hips with your thumbs grasping your low back. Lean back to arch your back then return to starting position. Use your thumbs to help isolate where you want to bend

## 2016-10-28 ENCOUNTER — Ambulatory Visit: Payer: Medicare Other | Admitting: *Deleted

## 2016-10-28 DIAGNOSIS — M545 Low back pain, unspecified: Secondary | ICD-10-CM

## 2016-10-28 DIAGNOSIS — R293 Abnormal posture: Secondary | ICD-10-CM | POA: Diagnosis not present

## 2016-10-28 NOTE — Therapy (Signed)
[facility] [place], [place], [address] Phone: [phone]   Fax:  [phone]  Physical Therapy Treatment  Patient Details  Name: [name] MRN: [id number] Date of Birth: 1932/09/10 Referring Provider: Kenn File MD.  Encounter Date: 10/28/2016      PT End of Session - 10/28/16 0940    Visit Number 3   Date for PT Re-Evaluation 12/19/16   PT Start Time 0900   PT Stop Time 0951   PT Time Calculation (min) 51 min      Past Medical History:  Diagnosis Date  . Arthritis   . BNC (bladder neck contracture)   . BPPV (benign paroxysmal positional vertigo)   . Cataract    right  . Diverticulosis   . H/O diarrhea SECONDARY TO RADIATION THERAPY --  INTERMITANT  DIARRHEA  . Hematuria   . History of hemorrhagic cystitis    SECONDARY RADIATION THERAPY 1997  . History of prostate cancer CURRENTLY  ELEVATED PSA--  LUPRON INJECTIONS   S/P PROSTATECTOMY AND RADIATION THERAPY  1997  . Hyperlipidemia   . Hypertension   . Hypothyroidism   . Radiation cystitis   . Radiation proctitis   . Urinary incontinence   . Urinary retention   . Vitamin D deficiency     Past Surgical History:  Procedure Laterality Date  . CATARACT EXTRACTION W/ INTRAOCULAR LENS IMPLANT Right   . COLONOSCOPY W/ POLYPECTOMY  2005; 08/11/2010   2005: 1 cm hyperplastic sigmoid polyp, Dr. Wynetta Emery 2012: hyperplastic splenic flexure polyp -1 cm, diverticulosis, radiation proctitis, anal stenosis  . CYSTO/ FULGERATION OF BLEEDERS/ RESECTION BLADDER NECK CONTRACTURE  07-06-2004   BNC AND RADIATION CYSTITIS  . EYE SURGERY    . KNEE ARTHROSCOPY Right   . LUMBAR DISC SURGERY  1996   L4 -- L5  . PROSTATECTOMY  1997  . SPINE SURGERY    . TRANSURETHRAL RESECTION OF BLADDER NECK N/A 06/23/2012   Procedure: TRANSURETHRAL RESECTION OF BLADDER NECK CONTRACTURE WITH GYRUS;  Surgeon: Claybon Jabs, MD;  Location: [facility];  Service: Urology;   Laterality: N/A;  . TRANSURETHRAL RESECTION OF BLADDER TUMOR WITH GYRUS (TURBT-GYRUS) N/A 06/01/2013   Procedure: TRANSURETHRAL RESECTION OF BLADDER NECK CONTRACTURE WITH GYRUS (TURBT-GYRUS) AND INJECTION OF KENALOG;  Surgeon: Claybon Jabs, MD;  Location: [facility];  Service: Urology;  Laterality: N/A;    There were no vitals filed for this visit.                       [facility] Adult PT Treatment/Exercise - 10/28/16 0001      Modalities   Modalities Electrical Stimulation;Moist Heat;Ultrasound     Moist Heat Therapy   Number Minutes Moist Heat 15 Minutes   Moist Heat Location Lumbar Spine     Electrical Stimulation   Electrical Stimulation Location Left SIJ. and LB paras premod 80-150hz  x 15 mins   Electrical Stimulation Goals Pain     Ultrasound   Ultrasound Location LT SIJ/LB paras   Ultrasound Parameters 1.5 w/cm2 x 10 mins   Ultrasound Goals Pain     Manual Therapy   Manual Therapy Soft tissue mobilization   Soft tissue mobilization STW/IASTM to LT SIJ and LB paras in RT sidelying                  PT Short Term Goals - 10/20/16 1402      PT SHORT TERM GOAL #1  Title STG's=LTG's.           PT Long Term Goals - 10/20/16 1402      PT LONG TERM GOAL #1   Title Independent with a HEP.   Time 8   Period Weeks   Status New     PT LONG TERM GOAL #2   Title Achieve 10 degrees of lumbar extension.   Time 8   Period Weeks   Status New     PT LONG TERM GOAL #3   Title Perform ADL's with pain not > 3/10.   Time 8   Period Weeks   Status New     PT LONG TERM GOAL #4   Title Sit 30 minutes with pain not > 3-4/10.   Time 8   Period Weeks   Status New               Plan - 10/28/16 0941    Clinical Impression Statement Pt arrived today doing fairly well after last Rx. His LT SIJ and LB paras were still sore during STW , but responded well to RX with decreased tightness and soreness in LB paras. Overall he is  progressing and has a  more upright posture.   Clinical Presentation Stable   Clinical Decision Making Low   Rehab Potential Good   PT Frequency 2x / week   PT Duration 8 weeks   PT Treatment/Interventions ADLs/Self Care Home Management;Cryotherapy;Electrical Stimulation;Therapeutic activities;Therapeutic exercise;Patient/family education;Manual techniques;Moist Heat;Ultrasound   PT Next Visit Plan STW/M to left SIJ region; modalities PRN; exercises to improve posture.   Consulted and Agree with Plan of Care Patient      Patient will benefit from skilled therapeutic intervention in order to improve the following deficits and impairments:  Abnormal gait, Decreased activity tolerance, Decreased range of motion, Decreased mobility, Postural dysfunction, Pain  Visit Diagnosis: Acute left-sided low back pain without sciatica  Abnormal posture     Problem List Patient Active Problem List   Diagnosis Date Noted  . Multiple lacunar infarcts 09/30/2016  . Aphasia   . TIA (transient ischemic attack) 04/03/2016  . Metabolic syndrome 56/25/6389  . Osteopenia of neck of left femur 01/15/2016  . H/O prostate cancer 06/27/2015  . Obesity (BMI 30-39.9) 12/27/2014  . Hypertension 07/04/2012  . Hyperlipidemia 07/04/2012  . Hypothyroidism 07/04/2012  . Bladder neck contracture 06/22/2012  . Radiation proctitis 08/11/2010  . Anal stenosis 08/11/2010  . Personal history of colonic polyps 08/11/2010    RAMSEUR,CHRIS, PTA 10/28/2016, 10:31 AM  [facility] [place], [place], [address] Phone: [phone]   Fax:  [phone]  Name: QURON RUDDY MRN: [id number] Date of Birth: 12-Oct-1932

## 2016-11-02 ENCOUNTER — Ambulatory Visit: Payer: Medicare Other | Admitting: Physical Therapy

## 2016-11-02 DIAGNOSIS — M545 Low back pain, unspecified: Secondary | ICD-10-CM

## 2016-11-02 DIAGNOSIS — R293 Abnormal posture: Secondary | ICD-10-CM

## 2016-11-02 NOTE — Therapy (Signed)
[facility] [place], [place], [address] Phone: [phone]   Fax:  [phone]  Physical Therapy Treatment  Patient Details  Name: [name] MRN: [id number] Date of Birth: 1932-12-24 Referring Provider: Kenn File MD.  Encounter Date: 11/02/2016      PT End of Session - 11/02/16 1055    Visit Number 4   Number of Visits 16   Date for PT Re-Evaluation 12/19/16   PT Start Time 0900   PT Stop Time 0950   PT Time Calculation (min) 50 min      Past Medical History:  Diagnosis Date  . Arthritis   . BNC (bladder neck contracture)   . BPPV (benign paroxysmal positional vertigo)   . Cataract    right  . Diverticulosis   . H/O diarrhea SECONDARY TO RADIATION THERAPY --  INTERMITANT  DIARRHEA  . Hematuria   . History of hemorrhagic cystitis    SECONDARY RADIATION THERAPY 1997  . History of prostate cancer CURRENTLY  ELEVATED PSA--  LUPRON INJECTIONS   S/P PROSTATECTOMY AND RADIATION THERAPY  1997  . Hyperlipidemia   . Hypertension   . Hypothyroidism   . Radiation cystitis   . Radiation proctitis   . Urinary incontinence   . Urinary retention   . Vitamin D deficiency     Past Surgical History:  Procedure Laterality Date  . CATARACT EXTRACTION W/ INTRAOCULAR LENS IMPLANT Right   . COLONOSCOPY W/ POLYPECTOMY  2005; 08/11/2010   2005: 1 cm hyperplastic sigmoid polyp, Dr. Wynetta Emery 2012: hyperplastic splenic flexure polyp -1 cm, diverticulosis, radiation proctitis, anal stenosis  . CYSTO/ FULGERATION OF BLEEDERS/ RESECTION BLADDER NECK CONTRACTURE  07-06-2004   BNC AND RADIATION CYSTITIS  . EYE SURGERY    . KNEE ARTHROSCOPY Right   . LUMBAR DISC SURGERY  1996   L4 -- L5  . PROSTATECTOMY  1997  . SPINE SURGERY    . TRANSURETHRAL RESECTION OF BLADDER NECK N/A 06/23/2012   Procedure: TRANSURETHRAL RESECTION OF BLADDER NECK CONTRACTURE WITH GYRUS;  Surgeon: Claybon Jabs, MD;  Location: [facility];  Service: Urology;  Laterality: N/A;  . TRANSURETHRAL RESECTION OF BLADDER TUMOR WITH GYRUS (TURBT-GYRUS) N/A 06/01/2013   Procedure: TRANSURETHRAL RESECTION OF BLADDER NECK CONTRACTURE WITH GYRUS (TURBT-GYRUS) AND INJECTION OF KENALOG;  Surgeon: Claybon Jabs, MD;  Location: [facility];  Service: Urology;  Laterality: N/A;    There were no vitals filed for this visit.      Subjective Assessment - 11/02/16 1056    Subjective i'm doing better.   Patient Stated Goals Get back to where I was with less pain in my low back.   Pain Score 2    Pain Location Back   Pain Orientation Left;Lower   Pain Descriptors / Indicators Aching;Spasm   Pain Onset 1 to 4 weeks ago                         [facility] Adult PT Treatment/Exercise - 11/02/16 0001      Exercises   Exercises Knee/Hip     Lumbar Exercises: Aerobic   Stationary Bike Nustep level 3 x 15 minutes.     Lumbar Exercises: Standing   Other Standing Lumbar Exercises Standing on Rockerboard x 5 minutes in parallel bars with draw-in for core activation/neuro re-education.     Lumbar Exercises: Supine   Other Supine Lumbar Exercises 3 sets to fatigue of hip briidges.  Modalities   Modalities Electrical Stimulation     Moist Heat Therapy   Number Minutes Moist Heat 20 Minutes   Moist Heat Location Lumbar Spine     Electrical Stimulation   Electrical Stimulation Location Lower lumbar.   Electrical Stimulation Action IFC   Electrical Stimulation Parameters 80-150 Hz x 20 minutes.   Electrical Stimulation Goals Pain                  PT Short Term Goals - 10/20/16 1402      PT SHORT TERM GOAL #1   Title STG's=LTG's.           PT Long Term Goals - 10/20/16 1402      PT LONG TERM GOAL #1   Title Independent with a HEP.   Time 8   Period Weeks   Status New     PT LONG TERM GOAL #2   Title Achieve 10 degrees of lumbar extension.   Time 8   Period Weeks   Status New      PT LONG TERM GOAL #3   Title Perform ADL's with pain not > 3/10.   Time 8   Period Weeks   Status New     PT LONG TERM GOAL #4   Title Sit 30 minutes with pain not > 3-4/10.   Time 8   Period Weeks   Status New               Plan - 11/02/16 1102    Clinical Impression Statement Excellent response to treatments with a low pain-level upon presentation to the clinic today.      Patient will benefit from skilled therapeutic intervention in order to improve the following deficits and impairments:     Visit Diagnosis: Acute left-sided low back pain without sciatica  Abnormal posture     Problem List Patient Active Problem List   Diagnosis Date Noted  . Multiple lacunar infarcts 09/30/2016  . Aphasia   . TIA (transient ischemic attack) 04/03/2016  . Metabolic syndrome 41/63/8453  . Osteopenia of neck of left femur 01/15/2016  . H/O prostate cancer 06/27/2015  . Obesity (BMI 30-39.9) 12/27/2014  . Hypertension 07/04/2012  . Hyperlipidemia 07/04/2012  . Hypothyroidism 07/04/2012  . Bladder neck contracture 06/22/2012  . Radiation proctitis 08/11/2010  . Anal stenosis 08/11/2010  . Personal history of colonic polyps 08/11/2010    Zacaria Pousson, [place]  MPT 11/02/2016, 11:04 AM  [facility] [address] [place], [place], [address] Phone: [phone]   Fax:  [phone]  Name: [name] MRN: [id number] Date of Birth: 1932-10-02

## 2016-11-04 ENCOUNTER — Ambulatory Visit: Payer: Medicare Other | Admitting: Physical Therapy

## 2016-11-04 ENCOUNTER — Encounter: Payer: Self-pay | Admitting: Physical Therapy

## 2016-11-04 DIAGNOSIS — M545 Low back pain, unspecified: Secondary | ICD-10-CM

## 2016-11-04 DIAGNOSIS — R293 Abnormal posture: Secondary | ICD-10-CM | POA: Diagnosis not present

## 2016-11-04 NOTE — Therapy (Signed)
[facility] [place], [place], [address] Phone: [phone]   Fax:  [phone]  Physical Therapy Treatment  Patient Details  Name: [name] MRN: [id number] Date of Birth: October 18, 1932 Referring Provider: Kenn File MD.  Encounter Date: 11/04/2016      PT End of Session - 11/04/16 0806    Visit Number 5   Number of Visits 16   Date for PT Re-Evaluation 12/19/16   PT Start Time 0728   PT Stop Time 0823   PT Time Calculation (min) 55 min   Activity Tolerance Patient tolerated treatment well   Behavior During Therapy [facility] for tasks assessed/performed      Past Medical History:  Diagnosis Date  . Arthritis   . BNC (bladder neck contracture)   . BPPV (benign paroxysmal positional vertigo)   . Cataract    right  . Diverticulosis   . H/O diarrhea SECONDARY TO RADIATION THERAPY --  INTERMITANT  DIARRHEA  . Hematuria   . History of hemorrhagic cystitis    SECONDARY RADIATION THERAPY 1997  . History of prostate cancer CURRENTLY  ELEVATED PSA--  LUPRON INJECTIONS   S/P PROSTATECTOMY AND RADIATION THERAPY  1997  . Hyperlipidemia   . Hypertension   . Hypothyroidism   . Radiation cystitis   . Radiation proctitis   . Urinary incontinence   . Urinary retention   . Vitamin D deficiency     Past Surgical History:  Procedure Laterality Date  . CATARACT EXTRACTION W/ INTRAOCULAR LENS IMPLANT Right   . COLONOSCOPY W/ POLYPECTOMY  2005; 08/11/2010   2005: 1 cm hyperplastic sigmoid polyp, Dr. Wynetta Emery 2012: hyperplastic splenic flexure polyp -1 cm, diverticulosis, radiation proctitis, anal stenosis  . CYSTO/ FULGERATION OF BLEEDERS/ RESECTION BLADDER NECK CONTRACTURE  07-06-2004   BNC AND RADIATION CYSTITIS  . EYE SURGERY    . KNEE ARTHROSCOPY Right   . LUMBAR DISC SURGERY  1996   L4 -- L5  . PROSTATECTOMY  1997  . SPINE SURGERY    . TRANSURETHRAL RESECTION OF BLADDER NECK N/A 06/23/2012   Procedure: TRANSURETHRAL  RESECTION OF BLADDER NECK CONTRACTURE WITH GYRUS;  Surgeon: Claybon Jabs, MD;  Location: [facility];  Service: Urology;  Laterality: N/A;  . TRANSURETHRAL RESECTION OF BLADDER TUMOR WITH GYRUS (TURBT-GYRUS) N/A 06/01/2013   Procedure: TRANSURETHRAL RESECTION OF BLADDER NECK CONTRACTURE WITH GYRUS (TURBT-GYRUS) AND INJECTION OF KENALOG;  Surgeon: Claybon Jabs, MD;  Location: [facility];  Service: Urology;  Laterality: N/A;    There were no vitals filed for this visit.      Subjective Assessment - 11/04/16 0735    Subjective Patient reported doing well after last treatment and just a "soreness" felt in low back today   Pertinent History Lumbar surgery.   Limitations Sitting   How long can you sit comfortably? 10 minutes.   Patient Stated Goals Get back to where I was with less pain in my low back.   Currently in Pain? Yes   Pain Score 2    Pain Location Back   Pain Orientation Left;Lower   Pain Descriptors / Indicators Sore   Pain Type Acute pain   Pain Onset 1 to 4 weeks ago   Pain Frequency Intermittent   Aggravating Factors  prolong activity or certain movements   Pain Relieving Factors at rest  [facility] Adult PT Treatment/Exercise - 11/04/16 0001      Lumbar Exercises: Stretches   Standing Extension 3 reps;10 seconds  with hip flexion stretch (each side)     Lumbar Exercises: Aerobic   Stationary Bike Nustep level 3 x 15 minutes, monitored for progression     Lumbar Exercises: Supine   Ab Set 20 reps;3 seconds   Glut Set 3 seconds;20 reps   Bridge 20 reps;3 seconds   Bridge Limitations with ball squeeze   Other Supine Lumbar Exercises seated for scap retraction and ext with grey boulster for educational cues 2x10 each   Other Supine Lumbar Exercises seated pink XTS for scap retraction and lat pull 2x10 each     Moist Heat Therapy   Number Minutes Moist Heat 15 Minutes   Moist Heat Location Lumbar  Spine     Electrical Stimulation   Electrical Stimulation Location Lower lumbar.   Electrical Stimulation Action IFC   Electrical Stimulation Parameters 80-150hz x72mn   Electrical Stimulation Goals Pain                  PT Short Term Goals - 10/20/16 1402      PT SHORT TERM GOAL #1   Title STG's=LTG's.           PT Long Term Goals - 11/04/16 0744      PT LONG TERM GOAL #1   Title Independent with a HEP.   Time 8   Period Weeks   Status On-going     PT LONG TERM GOAL #2   Title Achieve 10 degrees of lumbar extension.   Time 8   Period Weeks   Status On-going     PT LONG TERM GOAL #3   Title Perform ADL's with pain not > 3/10.   Time 8   Period Weeks   Status On-going     PT LONG TERM GOAL #4   Title Sit 30 minutes with pain not > 3-4/10.   Time 8   Period Weeks   Status Achieved  11/04/16               Plan - 11/04/16 0808    Clinical Impression Statement Patient tolerated treatment well today. Patient reported overall progress and was able to progress with core/posture strengthening. Patient reported being able to sit for over 30 min with no discomfort. Patient understands improratance of posture awareness techniques and core activations. Patient has limitations due to off balance at times and forward flexed posture. LTG#4 met today.   Rehab Potential Good   PT Frequency 2x / week   PT Duration 8 weeks   PT Treatment/Interventions ADLs/Self Care Home Management;Cryotherapy;Electrical Stimulation;Therapeutic activities;Therapeutic exercise;Patient/family education;Manual techniques;Moist Heat;Ultrasound   PT Next Visit Plan cont with POC for core/posture strengthening and modalities PRN   Consulted and Agree with Plan of Care Patient      Patient will benefit from skilled therapeutic intervention in order to improve the following deficits and impairments:  Abnormal gait, Decreased activity tolerance, Decreased range of motion, Decreased  mobility, Postural dysfunction, Pain  Visit Diagnosis: Acute left-sided low back pain without sciatica  Abnormal posture     Problem List Patient Active Problem List   Diagnosis Date Noted  . Multiple lacunar infarcts 09/30/2016  . Aphasia   . TIA (transient ischemic attack) 04/03/2016  . Metabolic syndrome 176/22/6333 . Osteopenia of neck of left femur 01/15/2016  . H/O prostate cancer 06/27/2015  . Obesity (BMI 30-39.9) 12/27/2014  .  Hypertension 07/04/2012  . Hyperlipidemia 07/04/2012  . Hypothyroidism 07/04/2012  . Bladder neck contracture 06/22/2012  . Radiation proctitis 08/11/2010  . Anal stenosis 08/11/2010  . Personal history of colonic polyps 08/11/2010    Phillips Climes, PTA 11/04/2016, 8:26 AM  [facility] [place], [place], [address] Phone: [phone]   Fax:  [phone]  Name: [name] MRN: [id number] Date of Birth: [birth year]

## 2016-11-09 ENCOUNTER — Ambulatory Visit: Payer: Medicare Other | Admitting: *Deleted

## 2016-11-09 DIAGNOSIS — R293 Abnormal posture: Secondary | ICD-10-CM | POA: Diagnosis not present

## 2016-11-09 DIAGNOSIS — M545 Low back pain, unspecified: Secondary | ICD-10-CM

## 2016-11-09 NOTE — Therapy (Signed)
[facility] [place], [place], [address] Phone: [phone]   Fax:  [phone]  Physical Therapy Treatment  Patient Details  Name: [name] MRN: [id number] Date of Birth: 11-08-1932 Referring Provider: Kenn File MD.  Encounter Date: 11/09/2016      PT End of Session - 11/09/16 0912    Visit Number 6   Number of Visits 16   Date for PT Re-Evaluation 12/19/16   PT Start Time 0900   PT Stop Time 0950   PT Time Calculation (min) 50 min      Past Medical History:  Diagnosis Date  . Arthritis   . BNC (bladder neck contracture)   . BPPV (benign paroxysmal positional vertigo)   . Cataract    right  . Diverticulosis   . H/O diarrhea SECONDARY TO RADIATION THERAPY --  INTERMITANT  DIARRHEA  . Hematuria   . History of hemorrhagic cystitis    SECONDARY RADIATION THERAPY 1997  . History of prostate cancer CURRENTLY  ELEVATED PSA--  LUPRON INJECTIONS   S/P PROSTATECTOMY AND RADIATION THERAPY  1997  . Hyperlipidemia   . Hypertension   . Hypothyroidism   . Radiation cystitis   . Radiation proctitis   . Urinary incontinence   . Urinary retention   . Vitamin D deficiency     Past Surgical History:  Procedure Laterality Date  . CATARACT EXTRACTION W/ INTRAOCULAR LENS IMPLANT Right   . COLONOSCOPY W/ POLYPECTOMY  2005; 08/11/2010   2005: 1 cm hyperplastic sigmoid polyp, Dr. Wynetta Emery 2012: hyperplastic splenic flexure polyp -1 cm, diverticulosis, radiation proctitis, anal stenosis  . CYSTO/ FULGERATION OF BLEEDERS/ RESECTION BLADDER NECK CONTRACTURE  07-06-2004   BNC AND RADIATION CYSTITIS  . EYE SURGERY    . KNEE ARTHROSCOPY Right   . LUMBAR DISC SURGERY  1996   L4 -- L5  . PROSTATECTOMY  1997  . SPINE SURGERY    . TRANSURETHRAL RESECTION OF BLADDER NECK N/A 06/23/2012   Procedure: TRANSURETHRAL RESECTION OF BLADDER NECK CONTRACTURE WITH GYRUS;  Surgeon: Claybon Jabs, MD;  Location: [facility];  Service: Urology;  Laterality: N/A;  . TRANSURETHRAL RESECTION OF BLADDER TUMOR WITH GYRUS (TURBT-GYRUS) N/A 06/01/2013   Procedure: TRANSURETHRAL RESECTION OF BLADDER NECK CONTRACTURE WITH GYRUS (TURBT-GYRUS) AND INJECTION OF KENALOG;  Surgeon: Claybon Jabs, MD;  Location: [facility];  Service: Urology;  Laterality: N/A;    There were no vitals filed for this visit.      Subjective Assessment - 11/09/16 0910    Subjective Patient reported doing well after last treatment and just a "soreness" felt in low back today   Pertinent History Lumbar surgery.   Limitations Sitting   How long can you sit comfortably? 10 minutes.   Patient Stated Goals Get back to where I was with less pain in my low back.   Currently in Pain? Yes   Pain Score 2    Pain Location Back   Pain Orientation Left;Lower   Pain Onset 1 to 4 weeks ago                         [facility] Adult PT Treatment/Exercise - 11/09/16 0001      Exercises   Exercises Knee/Hip     Lumbar Exercises: Aerobic   Stationary Bike Nustep level 5 x 15 minutes, monitored for progression     Lumbar Exercises: Standing   Row --  XTS  pink cord lat pulldowns 4x10, Rows 4x 10     Modalities   Modalities Electrical Stimulation     Moist Heat Therapy   Number Minutes Moist Heat 15 Minutes   Moist Heat Location Lumbar Spine     Electrical Stimulation   Electrical Stimulation Location Lower lumbar.  IFC x 15 mins 80-150hz    Electrical Stimulation Goals Pain                  PT Short Term Goals - 10/20/16 1402      PT SHORT TERM GOAL #1   Title STG's=LTG's.           PT Long Term Goals - 11/04/16 0744      PT LONG TERM GOAL #1   Title Independent with a HEP.   Time 8   Period Weeks   Status On-going     PT LONG TERM GOAL #2   Title Achieve 10 degrees of lumbar extension.   Time 8   Period Weeks   Status On-going     PT LONG TERM GOAL #3   Title Perform ADL's with  pain not > 3/10.   Time 8   Period Weeks   Status On-going     PT LONG TERM GOAL #4   Title Sit 30 minutes with pain not > 3-4/10.   Time 8   Period Weeks   Status Achieved  11/04/16               Plan - 11/09/16 0945    Clinical Impression Statement Pt arrived today doing better with decreased LBP and was able to do some yard work yesterday. He feels that he is 70-75% better. Pt did well with core and posture exs and normal responses to modalities      Patient will benefit from skilled therapeutic intervention in order to improve the following deficits and impairments:     Visit Diagnosis: Acute left-sided low back pain without sciatica  Abnormal posture     Problem List Patient Active Problem List   Diagnosis Date Noted  . Multiple lacunar infarcts 09/30/2016  . Aphasia   . TIA (transient ischemic attack) 04/03/2016  . Metabolic syndrome 16/10/9602  . Osteopenia of neck of left femur 01/15/2016  . H/O prostate cancer 06/27/2015  . Obesity (BMI 30-39.9) 12/27/2014  . Hypertension 07/04/2012  . Hyperlipidemia 07/04/2012  . Hypothyroidism 07/04/2012  . Bladder neck contracture 06/22/2012  . Radiation proctitis 08/11/2010  . Anal stenosis 08/11/2010  . Personal history of colonic polyps 08/11/2010    Zahra Peffley,CHRIS, PTA 11/09/2016, 10:03 AM  [facility] [address] [place], [place], [address] Phone: [phone]   Fax:  [phone]  Name: [name] MRN: [id number] Date of Birth: 1932-10-27

## 2016-11-11 ENCOUNTER — Ambulatory Visit: Payer: Medicare Other | Admitting: *Deleted

## 2016-11-11 DIAGNOSIS — M545 Low back pain, unspecified: Secondary | ICD-10-CM

## 2016-11-11 DIAGNOSIS — R293 Abnormal posture: Secondary | ICD-10-CM | POA: Diagnosis not present

## 2016-11-11 NOTE — Therapy (Signed)
[facility] [place], [place], [address] Phone: [phone]   Fax:  [phone]  Physical Therapy Treatment  Patient Details  Name: [name] MRN: [id number] Date of Birth: [birth year] Referring Provider: Kenn File MD.  Encounter Date: 11/11/2016      PT End of Session - 11/11/16 0928    Visit Number 7   Number of Visits 16   Date for PT Re-Evaluation 12/19/16   PT Start Time 0900   PT Stop Time 0950   PT Time Calculation (min) 50 min      Past Medical History:  Diagnosis Date  . Arthritis   . BNC (bladder neck contracture)   . BPPV (benign paroxysmal positional vertigo)   . Cataract    right  . Diverticulosis   . H/O diarrhea SECONDARY TO RADIATION THERAPY --  INTERMITANT  DIARRHEA  . Hematuria   . History of hemorrhagic cystitis    SECONDARY RADIATION THERAPY 1997  . History of prostate cancer CURRENTLY  ELEVATED PSA--  LUPRON INJECTIONS   S/P PROSTATECTOMY AND RADIATION THERAPY  1997  . Hyperlipidemia   . Hypertension   . Hypothyroidism   . Radiation cystitis   . Radiation proctitis   . Urinary incontinence   . Urinary retention   . Vitamin D deficiency     Past Surgical History:  Procedure Laterality Date  . CATARACT EXTRACTION W/ INTRAOCULAR LENS IMPLANT Right   . COLONOSCOPY W/ POLYPECTOMY  2005; 08/11/2010   2005: 1 cm hyperplastic sigmoid polyp, Dr. Wynetta Emery 2012: hyperplastic splenic flexure polyp -1 cm, diverticulosis, radiation proctitis, anal stenosis  . CYSTO/ FULGERATION OF BLEEDERS/ RESECTION BLADDER NECK CONTRACTURE  07-06-2004   BNC AND RADIATION CYSTITIS  . EYE SURGERY    . KNEE ARTHROSCOPY Right   . LUMBAR DISC SURGERY  1996   L4 -- L5  . PROSTATECTOMY  1997  . SPINE SURGERY    . TRANSURETHRAL RESECTION OF BLADDER NECK N/A 06/23/2012   Procedure: TRANSURETHRAL RESECTION OF BLADDER NECK CONTRACTURE WITH GYRUS;  Surgeon: Claybon Jabs, MD;  Location: [facility];  Service: Urology;  Laterality: N/A;  . TRANSURETHRAL RESECTION OF BLADDER TUMOR WITH GYRUS (TURBT-GYRUS) N/A 06/01/2013   Procedure: TRANSURETHRAL RESECTION OF BLADDER NECK CONTRACTURE WITH GYRUS (TURBT-GYRUS) AND INJECTION OF KENALOG;  Surgeon: Claybon Jabs, MD;  Location: [facility];  Service: Urology;  Laterality: N/A;    There were no vitals filed for this visit.      Subjective Assessment - 11/11/16 0926    Subjective I strained my back yesterday lifting some pine straw bails. My RT knee is still bothering me   Pertinent History Lumbar surgery.   Limitations Sitting   How long can you sit comfortably? 10 minutes.   Patient Stated Goals Get back to where I was with less pain in my low back.   Currently in Pain? Yes   Pain Score 3    Pain Location Back   Pain Orientation Left   Pain Type Acute pain   Pain Onset 1 to 4 weeks ago                         [facility] Adult PT Treatment/Exercise - 11/11/16 0001      Lumbar Exercises: Aerobic   Stationary Bike Nustep level 5 x 15 minutes, monitored for progression     Lumbar Exercises: Standing   Row --  XTS pink cord  lat pulldowns 4x10, Rows 4x 10     Modalities   Modalities Electrical Stimulation     Moist Heat Therapy   Number Minutes Moist Heat 15 Minutes   Moist Heat Location Lumbar Spine     Electrical Stimulation   Electrical Stimulation Location Lower lumbar.  IFC x 15 mins 80-'150hz'$    Electrical Stimulation Goals Pain     Manual Therapy   Manual Therapy Soft tissue mobilization   Soft tissue mobilization STW/IASTM to LT SIJ and thoracolumbar paras on RT side                  PT Short Term Goals - 10/20/16 1402      PT SHORT TERM GOAL #1   Title STG's=LTG's.           PT Long Term Goals - 11/04/16 0744      PT LONG TERM GOAL #1   Title Independent with a HEP.   Time 8   Period Weeks   Status On-going     PT LONG TERM GOAL #2   Title Achieve 10  degrees of lumbar extension.   Time 8   Period Weeks   Status On-going     PT LONG TERM GOAL #3   Title Perform ADL's with pain not > 3/10.   Time 8   Period Weeks   Status On-going     PT LONG TERM GOAL #4   Title Sit 30 minutes with pain not > 3-4/10.   Time 8   Period Weeks   Status Achieved  11/04/16               Plan - 11/11/16 1030    Clinical Impression Statement Pt arrived today having a little more back pain due to working some in the yard. He was lifting a pine straw bail and felt increased tightness and pain in thoracolumbar paras RT side. He did well with Rx today and was able to perform Posture and core activities with minimal increase in pain. Pt had decreased pain after Rx and decreased taughtness in RT side paras. No LTGs met today due to increased pain   Clinical Decision Making Low   Rehab Potential Good   PT Frequency 2x / week   PT Duration 8 weeks   PT Treatment/Interventions ADLs/Self Care Home Management;Cryotherapy;Electrical Stimulation;Therapeutic activities;Therapeutic exercise;Patient/family education;Manual techniques;Moist Heat;Ultrasound   PT Next Visit Plan cont with POC for core/posture strengthening and modalities PRN   Consulted and Agree with Plan of Care Patient      Patient will benefit from skilled therapeutic intervention in order to improve the following deficits and impairments:  Abnormal gait, Decreased activity tolerance, Decreased range of motion, Decreased mobility, Postural dysfunction, Pain  Visit Diagnosis: Acute left-sided low back pain without sciatica  Abnormal posture     Problem List Patient Active Problem List   Diagnosis Date Noted  . Multiple lacunar infarcts 09/30/2016  . Aphasia   . TIA (transient ischemic attack) 04/03/2016  . Metabolic syndrome 23/55/7322  . Osteopenia of neck of left femur 01/15/2016  . H/O prostate cancer 06/27/2015  . Obesity (BMI 30-39.9) 12/27/2014  . Hypertension 07/04/2012   . Hyperlipidemia 07/04/2012  . Hypothyroidism 07/04/2012  . Bladder neck contracture 06/22/2012  . Radiation proctitis 08/11/2010  . Anal stenosis 08/11/2010  . Personal history of colonic polyps 08/11/2010    Kristalynn Coddington,CHRIS, PTA 11/11/2016, 1:24 PM  [facility] [address] [place], [place], [address] Phone: [phone]  Fax:  [phone]  Name: BJ MORLOCK MRN: [id number] Date of Birth: 1932/12/10

## 2016-11-16 ENCOUNTER — Encounter: Payer: Medicare Other | Admitting: Physical Therapy

## 2016-11-17 ENCOUNTER — Encounter: Payer: Self-pay | Admitting: Physical Therapy

## 2016-11-17 ENCOUNTER — Ambulatory Visit: Payer: Medicare Other | Admitting: Physical Therapy

## 2016-11-17 DIAGNOSIS — M545 Low back pain, unspecified: Secondary | ICD-10-CM

## 2016-11-17 DIAGNOSIS — R293 Abnormal posture: Secondary | ICD-10-CM

## 2016-11-17 NOTE — Therapy (Signed)
[facility] [place], [place], [address] Phone: [phone]   Fax:  [phone]  Physical Therapy Treatment  Patient Details  Name: [name] MRN: [id number] Date of Birth: [birth year] Referring Provider: Kenn File MD.  Encounter Date: 11/17/2016      PT End of Session - 11/17/16 0949    Visit Number 8   Number of Visits 16   Date for PT Re-Evaluation 12/19/16   PT Start Time 0946   PT Stop Time 1035   PT Time Calculation (min) 49 min   Activity Tolerance Patient tolerated treatment well   Behavior During Therapy [facility] for tasks assessed/performed      Past Medical History:  Diagnosis Date  . Arthritis   . BNC (bladder neck contracture)   . BPPV (benign paroxysmal positional vertigo)   . Cataract    right  . Diverticulosis   . H/O diarrhea SECONDARY TO RADIATION THERAPY --  INTERMITANT  DIARRHEA  . Hematuria   . History of hemorrhagic cystitis    SECONDARY RADIATION THERAPY 1997  . History of prostate cancer CURRENTLY  ELEVATED PSA--  LUPRON INJECTIONS   S/P PROSTATECTOMY AND RADIATION THERAPY  1997  . Hyperlipidemia   . Hypertension   . Hypothyroidism   . Radiation cystitis   . Radiation proctitis   . Urinary incontinence   . Urinary retention   . Vitamin D deficiency     Past Surgical History:  Procedure Laterality Date  . CATARACT EXTRACTION W/ INTRAOCULAR LENS IMPLANT Right   . COLONOSCOPY W/ POLYPECTOMY  2005; 08/11/2010   2005: 1 cm hyperplastic sigmoid polyp, Dr. Wynetta Emery 2012: hyperplastic splenic flexure polyp -1 cm, diverticulosis, radiation proctitis, anal stenosis  . CYSTO/ FULGERATION OF BLEEDERS/ RESECTION BLADDER NECK CONTRACTURE  07-06-2004   BNC AND RADIATION CYSTITIS  . EYE SURGERY    . KNEE ARTHROSCOPY Right   . LUMBAR DISC SURGERY  1996   L4 -- L5  . PROSTATECTOMY  1997  . SPINE SURGERY    . TRANSURETHRAL RESECTION OF BLADDER NECK N/A 06/23/2012   Procedure: TRANSURETHRAL  RESECTION OF BLADDER NECK CONTRACTURE WITH GYRUS;  Surgeon: Claybon Jabs, MD;  Location: [facility];  Service: Urology;  Laterality: N/A;  . TRANSURETHRAL RESECTION OF BLADDER TUMOR WITH GYRUS (TURBT-GYRUS) N/A 06/01/2013   Procedure: TRANSURETHRAL RESECTION OF BLADDER NECK CONTRACTURE WITH GYRUS (TURBT-GYRUS) AND INJECTION OF KENALOG;  Surgeon: Claybon Jabs, MD;  Location: [facility];  Service: Urology;  Laterality: N/A;    There were no vitals filed for this visit.      Subjective Assessment - 11/17/16 0948    Subjective Reports that his back feels good and denies any pain.   Pertinent History Lumbar surgery.   Limitations Sitting   How long can you sit comfortably? 10 minutes.   Patient Stated Goals Get back to where I was with less pain in my low back.   Currently in Pain? No/denies            [facility] PT Assessment - 11/17/16 0001      Assessment   Medical Diagnosis Bilateral low back pain.     Precautions   Precautions None     Restrictions   Weight Bearing Restrictions No                     [facility] Adult PT Treatment/Exercise - 11/17/16 0001      Lumbar Exercises: Aerobic  Stationary Bike Nustep level 5 x 18 minutes, monitored for progression     Lumbar Exercises: Chief Strategy Officer;Both   Row Limitations 3x10 reps with Pink XTS   Other Standing Lumbar Exercises Lat pulldown pink XTS x20 reps BLE in staggered stance   Other Standing Lumbar Exercises B hip abduction x15 reps each     Lumbar Exercises: Seated   Sit to Stand 10 reps     Modalities   Modalities Electrical Stimulation;Moist Heat     Moist Heat Therapy   Number Minutes Moist Heat 15 Minutes   Moist Heat Location Lumbar Spine     Electrical Stimulation   Electrical Stimulation Location B low back   Electrical Stimulation Action Pre-Mod   Electrical Stimulation Parameters 80-150 hz x15 min   Electrical Stimulation Goals Other (comment)   per patient report                  PT Short Term Goals - 10/20/16 1402      PT SHORT TERM GOAL #1   Title STG's=LTG's.           PT Long Term Goals - 11/04/16 0744      PT LONG TERM GOAL #1   Title Independent with a HEP.   Time 8   Period Weeks   Status On-going     PT LONG TERM GOAL #2   Title Achieve 10 degrees of lumbar extension.   Time 8   Period Weeks   Status On-going     PT LONG TERM GOAL #3   Title Perform ADL's with pain not > 3/10.   Time 8   Period Weeks   Status On-going     PT LONG TERM GOAL #4   Title Sit 30 minutes with pain not > 3-4/10.   Time 8   Period Weeks   Status Achieved  11/04/16               Plan - 11/17/16 1110    Clinical Impression Statement Patient arrived in clinic today with no reports of LBP. Patient able to tolerate and complete more standing exercises for LE and postural strengthening. No complaints of any increased pain with exercises today. Patient also guided through sit to stands with VCs and demo for proper technique without UE assist. Normal modalities response noted following removal of the modalities per patient request.   Rehab Potential Good   PT Frequency 2x / week   PT Duration 8 weeks   PT Treatment/Interventions ADLs/Self Care Home Management;Cryotherapy;Electrical Stimulation;Therapeutic activities;Therapeutic exercise;Patient/family education;Manual techniques;Moist Heat;Ultrasound   PT Next Visit Plan cont with POC for core/posture strengthening and modalities PRN   Consulted and Agree with Plan of Care Patient      Patient will benefit from skilled therapeutic intervention in order to improve the following deficits and impairments:  Abnormal gait, Decreased activity tolerance, Decreased range of motion, Decreased mobility, Postural dysfunction, Pain  Visit Diagnosis: Acute left-sided low back pain without sciatica  Abnormal posture     Problem List Patient Active Problem List    Diagnosis Date Noted  . Multiple lacunar infarcts 09/30/2016  . Aphasia   . TIA (transient ischemic attack) 04/03/2016  . Metabolic syndrome 93/79/0240  . Osteopenia of neck of left femur 01/15/2016  . H/O prostate cancer 06/27/2015  . Obesity (BMI 30-39.9) 12/27/2014  . Hypertension 07/04/2012  . Hyperlipidemia 07/04/2012  . Hypothyroidism 07/04/2012  . Bladder neck contracture 06/22/2012  . Radiation proctitis 08/11/2010  .  Anal stenosis 08/11/2010  . Personal history of colonic polyps 08/11/2010    Wynelle Fanny, PTA 11/17/2016, 11:16 AM  [facility] [address] [place], [place], [address] Phone: [phone]   Fax:  [phone]  Name: [name] MRN: [id number] Date of Birth: [birth year]

## 2016-11-18 ENCOUNTER — Ambulatory Visit: Payer: Medicare Other | Admitting: *Deleted

## 2016-11-18 DIAGNOSIS — R293 Abnormal posture: Secondary | ICD-10-CM

## 2016-11-18 DIAGNOSIS — M545 Low back pain, unspecified: Secondary | ICD-10-CM

## 2016-11-18 NOTE — Therapy (Signed)
[facility] [place], [place], [address] Phone: [phone]   Fax:  [phone]  Physical Therapy Treatment  Patient Details  Name: [name] MRN: [id number] Date of Birth: 1932-07-29 Referring Provider: Kenn File MD.  Encounter Date: 11/18/2016      PT End of Session - 11/18/16 0831    Visit Number 9   Number of Visits 16   Date for PT Re-Evaluation 12/19/16   PT Start Time 0815   PT Stop Time 0905   PT Time Calculation (min) 50 min      Past Medical History:  Diagnosis Date  . Arthritis   . BNC (bladder neck contracture)   . BPPV (benign paroxysmal positional vertigo)   . Cataract    right  . Diverticulosis   . H/O diarrhea SECONDARY TO RADIATION THERAPY --  INTERMITANT  DIARRHEA  . Hematuria   . History of hemorrhagic cystitis    SECONDARY RADIATION THERAPY 1997  . History of prostate cancer CURRENTLY  ELEVATED PSA--  LUPRON INJECTIONS   S/P PROSTATECTOMY AND RADIATION THERAPY  1997  . Hyperlipidemia   . Hypertension   . Hypothyroidism   . Radiation cystitis   . Radiation proctitis   . Urinary incontinence   . Urinary retention   . Vitamin D deficiency     Past Surgical History:  Procedure Laterality Date  . CATARACT EXTRACTION W/ INTRAOCULAR LENS IMPLANT Right   . COLONOSCOPY W/ POLYPECTOMY  2005; 08/11/2010   2005: 1 cm hyperplastic sigmoid polyp, Dr. Wynetta Emery 2012: hyperplastic splenic flexure polyp -1 cm, diverticulosis, radiation proctitis, anal stenosis  . CYSTO/ FULGERATION OF BLEEDERS/ RESECTION BLADDER NECK CONTRACTURE  07-06-2004   BNC AND RADIATION CYSTITIS  . EYE SURGERY    . KNEE ARTHROSCOPY Right   . LUMBAR DISC SURGERY  1996   L4 -- L5  . PROSTATECTOMY  1997  . SPINE SURGERY    . TRANSURETHRAL RESECTION OF BLADDER NECK N/A 06/23/2012   Procedure: TRANSURETHRAL RESECTION OF BLADDER NECK CONTRACTURE WITH GYRUS;  Surgeon: Claybon Jabs, MD;  Location: [facility];  Service: Urology;  Laterality: N/A;  . TRANSURETHRAL RESECTION OF BLADDER TUMOR WITH GYRUS (TURBT-GYRUS) N/A 06/01/2013   Procedure: TRANSURETHRAL RESECTION OF BLADDER NECK CONTRACTURE WITH GYRUS (TURBT-GYRUS) AND INJECTION OF KENALOG;  Surgeon: Claybon Jabs, MD;  Location: [facility];  Service: Urology;  Laterality: N/A;    There were no vitals filed for this visit.      Subjective Assessment - 11/18/16 0806    Subjective Reports that his back feels good and denies any pain.   Pertinent History Lumbar surgery.   Limitations Sitting   How long can you sit comfortably? 10 minutes.   Patient Stated Goals Get back to where I was with less pain in my low back.   Currently in Pain? Yes   Pain Score 3    Pain Location Back   Pain Orientation Left   Pain Descriptors / Indicators Sore   Pain Type Acute pain   Pain Onset 1 to 4 weeks ago                         [facility] Adult PT Treatment/Exercise - 11/18/16 0001      Lumbar Exercises: Aerobic   Stationary Bike Nustep level 5 x20 minutes, monitored for progression     Lumbar Exercises: Chief Strategy Officer;Both  x40   Other  Standing Lumbar Exercises Lat pulldown pink XTS 2 x20 reps BLE in staggered stance   Other Standing Lumbar Exercises B hip abduction x15 reps each     Lumbar Exercises: Seated   Sit to Stand 10 reps     Modalities   Modalities Electrical Stimulation;Moist Heat     Moist Heat Therapy   Number Minutes Moist Heat 15 Minutes   Moist Heat Location Lumbar Spine     Electrical Stimulation   Electrical Stimulation Location Lower lumbar.  IFC x 15 mins 80-150hz    Electrical Stimulation Goals Other (comment)                  PT Short Term Goals - 10/20/16 1402      PT SHORT TERM GOAL #1   Title STG's=LTG's.           PT Long Term Goals - 11/04/16 0744      PT LONG TERM GOAL #1   Title Independent with a HEP.   Time 8   Period Weeks   Status  On-going     PT LONG TERM GOAL #2   Title Achieve 10 degrees of lumbar extension.   Time 8   Period Weeks   Status On-going     PT LONG TERM GOAL #3   Title Perform ADL's with pain not > 3/10.   Time 8   Period Weeks   Status On-going     PT LONG TERM GOAL #4   Title Sit 30 minutes with pain not > 3-4/10.   Time 8   Period Weeks   Status Achieved  11/04/16               Plan - 11/18/16 9937    Clinical Impression Statement Pt arrived to clinic doing better with decreased LBP. He reports being able to perform things easier at home now, but LBP can still get to 5/10 so LTG is still ongoing due to pain. He was able to complete all therex and stabilization exs with pain below 3/10.   Clinical Decision Making Low   Rehab Potential Good   PT Frequency 2x / week   PT Duration 8 weeks   PT Treatment/Interventions ADLs/Self Care Home Management;Cryotherapy;Electrical Stimulation;Therapeutic activities;Therapeutic exercise;Patient/family education;Manual techniques;Moist Heat;Ultrasound   PT Next Visit Plan cont with POC for core/posture strengthening and modalities PRN   Consulted and Agree with Plan of Care Patient      Patient will benefit from skilled therapeutic intervention in order to improve the following deficits and impairments:  Abnormal gait, Decreased activity tolerance, Decreased range of motion, Decreased mobility, Postural dysfunction, Pain  Visit Diagnosis: Acute left-sided low back pain without sciatica  Abnormal posture     Problem List Patient Active Problem List   Diagnosis Date Noted  . Multiple lacunar infarcts 09/30/2016  . Aphasia   . TIA (transient ischemic attack) 04/03/2016  . Metabolic syndrome 16/96/7893  . Osteopenia of neck of left femur 01/15/2016  . H/O prostate cancer 06/27/2015  . Obesity (BMI 30-39.9) 12/27/2014  . Hypertension 07/04/2012  . Hyperlipidemia 07/04/2012  . Hypothyroidism 07/04/2012  . Bladder neck contracture  06/22/2012  . Radiation proctitis 08/11/2010  . Anal stenosis 08/11/2010  . Personal history of colonic polyps 08/11/2010    Emaly Boschert,CHRIS, PTA 11/18/2016, 9:16 AM  [facility] [place], [place], [address] Phone: [phone]   Fax:  [phone]  Name: [name] MRN: [id number] Date of Birth: [birth year]

## 2016-11-22 ENCOUNTER — Encounter: Payer: Self-pay | Admitting: Physical Therapy

## 2016-11-22 ENCOUNTER — Ambulatory Visit: Payer: Medicare Other | Admitting: Physical Therapy

## 2016-11-22 DIAGNOSIS — M545 Low back pain, unspecified: Secondary | ICD-10-CM

## 2016-11-22 DIAGNOSIS — R293 Abnormal posture: Secondary | ICD-10-CM | POA: Diagnosis not present

## 2016-11-22 NOTE — Therapy (Signed)
[facility] [place], [place], [address] Phone: [phone]   Fax:  [phone]  Physical Therapy Treatment  Patient Details  Name: [name] MRN: [id number] Date of Birth: 06-09-1932 Referring Provider: Kenn File MD.  Encounter Date: 11/22/2016      PT End of Session - 11/22/16 1241    Visit Number 10   Number of Visits 16   Date for PT Re-Evaluation 12/19/16   PT Start Time 1200   PT Stop Time 1253   PT Time Calculation (min) 53 min   Activity Tolerance Patient tolerated treatment well   Behavior During Therapy [facility] for tasks assessed/performed      Past Medical History:  Diagnosis Date  . Arthritis   . BNC (bladder neck contracture)   . BPPV (benign paroxysmal positional vertigo)   . Cataract    right  . Diverticulosis   . H/O diarrhea SECONDARY TO RADIATION THERAPY --  INTERMITANT  DIARRHEA  . Hematuria   . History of hemorrhagic cystitis    SECONDARY RADIATION THERAPY 1997  . History of prostate cancer CURRENTLY  ELEVATED PSA--  LUPRON INJECTIONS   S/P PROSTATECTOMY AND RADIATION THERAPY  1997  . Hyperlipidemia   . Hypertension   . Hypothyroidism   . Radiation cystitis   . Radiation proctitis   . Urinary incontinence   . Urinary retention   . Vitamin D deficiency     Past Surgical History:  Procedure Laterality Date  . CATARACT EXTRACTION W/ INTRAOCULAR LENS IMPLANT Right   . COLONOSCOPY W/ POLYPECTOMY  2005; 08/11/2010   2005: 1 cm hyperplastic sigmoid polyp, Dr. Wynetta Emery 2012: hyperplastic splenic flexure polyp -1 cm, diverticulosis, radiation proctitis, anal stenosis  . CYSTO/ FULGERATION OF BLEEDERS/ RESECTION BLADDER NECK CONTRACTURE  07-06-2004   BNC AND RADIATION CYSTITIS  . EYE SURGERY    . KNEE ARTHROSCOPY Right   . LUMBAR DISC SURGERY  1996   L4 -- L5  . PROSTATECTOMY  1997  . SPINE SURGERY    . TRANSURETHRAL RESECTION OF BLADDER NECK N/A 06/23/2012   Procedure:  TRANSURETHRAL RESECTION OF BLADDER NECK CONTRACTURE WITH GYRUS;  Surgeon: Claybon Jabs, MD;  Location: [facility];  Service: Urology;  Laterality: N/A;  . TRANSURETHRAL RESECTION OF BLADDER TUMOR WITH GYRUS (TURBT-GYRUS) N/A 06/01/2013   Procedure: TRANSURETHRAL RESECTION OF BLADDER NECK CONTRACTURE WITH GYRUS (TURBT-GYRUS) AND INJECTION OF KENALOG;  Surgeon: Claybon Jabs, MD;  Location: [place];  Service: Urology;  Laterality: N/A;    There were no vitals filed for this visit.      Subjective Assessment - 11/22/16 1215    Subjective no complaints today   Pertinent History Lumbar surgery.   Limitations Sitting   How long can you sit comfortably? 10 minutes.   Patient Stated Goals Get back to where I was with less pain in my low back.   Currently in Pain? No/denies                         [facility] Adult PT Treatment/Exercise - 11/22/16 0001      Lumbar Exercises: Aerobic   Stationary Bike Nustep level 5 x20 minutes, monitored for progression     Lumbar Exercises: Chief Strategy Officer;Both   Row Limitations 2x20 with pink XTS   Other Standing Lumbar Exercises Lat pulldown pink XTS 2 x20 reps BLE in staggered stance   Other Standing Lumbar Exercises standing hip  flexor stretch with UE flexion x5 each , standing with red swiss ball for stabilization upper back with "W", "V" 2x10 each     Moist Heat Therapy   Number Minutes Moist Heat 15 Minutes   Moist Heat Location Lumbar Spine     Electrical Stimulation   Electrical Stimulation Location Lower lumbar.  IFC x 15 mins 80-150hz                   PT Short Term Goals - 10/20/16 1402      PT SHORT TERM GOAL #1   Title STG's=LTG's.           PT Long Term Goals - 11/04/16 0744      PT LONG TERM GOAL #1   Title Independent with a HEP.   Time 8   Period Weeks   Status On-going     PT LONG TERM GOAL #2   Title Achieve 10 degrees of lumbar extension.   Time  8   Period Weeks   Status On-going     PT LONG TERM GOAL #3   Title Perform ADL's with pain not > 3/10.   Time 8   Period Weeks   Status On-going     PT LONG TERM GOAL #4   Title Sit 30 minutes with pain not > 3-4/10.   Time 8   Period Weeks   Status Achieved  11/04/16               Plan - 11/22/16 1246    Clinical Impression Statement Patient tolerated treatment well today. Patient able to progress with core activation and strengthening activities today. Patient reported no pain today yet has increased discomfort with any leaning forward or prolong activity. Today adjusted patient SPC to height to promote safe and uprite position with demo of proper use of step to and step through. Patient requested modalities today. Goals progressing. FOTO 28% limitation (initial 39%)   Rehab Potential Good   PT Frequency 2x / week   PT Duration 8 weeks   PT Treatment/Interventions ADLs/Self Care Home Management;Cryotherapy;Electrical Stimulation;Therapeutic activities;Therapeutic exercise;Patient/family education;Manual techniques;Moist Heat;Ultrasound   PT Next Visit Plan cont with POC for core/posture strengthening and modalities PRN   Consulted and Agree with Plan of Care Patient      Patient will benefit from skilled therapeutic intervention in order to improve the following deficits and impairments:  Abnormal gait, Decreased activity tolerance, Decreased range of motion, Decreased mobility, Postural dysfunction, Pain  Visit Diagnosis: Acute left-sided low back pain without sciatica  Abnormal posture     Problem List Patient Active Problem List   Diagnosis Date Noted  . Multiple lacunar infarcts 09/30/2016  . Aphasia   . TIA (transient ischemic attack) 04/03/2016  . Metabolic syndrome 01/77/9390  . Osteopenia of neck of left femur 01/15/2016  . H/O prostate cancer 06/27/2015  . Obesity (BMI 30-39.9) 12/27/2014  . Hypertension 07/04/2012  . Hyperlipidemia 07/04/2012  .  Hypothyroidism 07/04/2012  . Bladder neck contracture 06/22/2012  . Radiation proctitis 08/11/2010  . Anal stenosis 08/11/2010  . Personal history of colonic polyps 08/11/2010   Ladean Raya, PTA 11/22/16 12:55 PM  [facility] [address] [place], [place], [address] Phone: [phone]   Fax:  [phone]  Name: CHANZ CAHALL MRN: [id number] Date of Birth: 12-08-1932

## 2016-11-24 ENCOUNTER — Ambulatory Visit: Payer: Medicare Other | Admitting: Physical Therapy

## 2016-11-24 DIAGNOSIS — R293 Abnormal posture: Secondary | ICD-10-CM

## 2016-11-24 DIAGNOSIS — M545 Low back pain, unspecified: Secondary | ICD-10-CM

## 2016-11-24 NOTE — Therapy (Signed)
[facility] [place], [place], [address] Phone: [phone]   Fax:  [phone]  Physical Therapy Treatment  Patient Details  Name: [name] MRN: [id number] Date of Birth: 01-04-1933 Referring Provider: Kenn File MD.  Encounter Date: 11/24/2016      PT End of Session - 11/24/16 1112    Visit Number 10   Number of Visits 16   Date for PT Re-Evaluation 12/19/16   PT Start Time 0945   PT Stop Time 1042   PT Time Calculation (min) 57 min   Activity Tolerance Patient tolerated treatment well   Behavior During Therapy [facility] for tasks assessed/performed      Past Medical History:  Diagnosis Date  . Arthritis   . BNC (bladder neck contracture)   . BPPV (benign paroxysmal positional vertigo)   . Cataract    right  . Diverticulosis   . H/O diarrhea SECONDARY TO RADIATION THERAPY --  INTERMITANT  DIARRHEA  . Hematuria   . History of hemorrhagic cystitis    SECONDARY RADIATION THERAPY 1997  . History of prostate cancer CURRENTLY  ELEVATED PSA--  LUPRON INJECTIONS   S/P PROSTATECTOMY AND RADIATION THERAPY  1997  . Hyperlipidemia   . Hypertension   . Hypothyroidism   . Radiation cystitis   . Radiation proctitis   . Urinary incontinence   . Urinary retention   . Vitamin D deficiency     Past Surgical History:  Procedure Laterality Date  . CATARACT EXTRACTION W/ INTRAOCULAR LENS IMPLANT Right   . COLONOSCOPY W/ POLYPECTOMY  2005; 08/11/2010   2005: 1 cm hyperplastic sigmoid polyp, Dr. Wynetta Emery 2012: hyperplastic splenic flexure polyp -1 cm, diverticulosis, radiation proctitis, anal stenosis  . CYSTO/ FULGERATION OF BLEEDERS/ RESECTION BLADDER NECK CONTRACTURE  07-06-2004   BNC AND RADIATION CYSTITIS  . EYE SURGERY    . KNEE ARTHROSCOPY Right   . LUMBAR DISC SURGERY  1996   L4 -- L5  . PROSTATECTOMY  1997  . SPINE SURGERY    . TRANSURETHRAL RESECTION OF BLADDER NECK N/A 06/23/2012   Procedure:  TRANSURETHRAL RESECTION OF BLADDER NECK CONTRACTURE WITH GYRUS;  Surgeon: Claybon Jabs, MD;  Location: [facility];  Service: Urology;  Laterality: N/A;  . TRANSURETHRAL RESECTION OF BLADDER TUMOR WITH GYRUS (TURBT-GYRUS) N/A 06/01/2013   Procedure: TRANSURETHRAL RESECTION OF BLADDER NECK CONTRACTURE WITH GYRUS (TURBT-GYRUS) AND INJECTION OF KENALOG;  Surgeon: Claybon Jabs, MD;  Location: [place];  Service: Urology;  Laterality: N/A;    There were no vitals filed for this visit.      Subjective Assessment - 11/24/16 1026    Subjective No new complaints.   Patient Stated Goals Get back to where I was with less pain in my low back.   Pain Score 3    Pain Location Back   Pain Type Acute pain   Pain Onset 1 to 4 weeks ago                         [facility] Adult PT Treatment/Exercise - 11/24/16 0001      Exercises   Exercises Knee/Hip     Lumbar Exercises: Aerobic   Stationary Bike Nustep level 5 x 20 minutes.     Lumbar Exercises: Standing   Other Standing Lumbar Exercises On Rockerboard x 6 minutes with core activation/draw-in and promotion of upright posture x 6 minutes.     Modalities   Modalities  Electrical Stimulation;Moist Heat     Moist Heat Therapy   Number Minutes Moist Heat 20 Minutes   Moist Heat Location Lumbar Spine     Electrical Stimulation   Electrical Stimulation Location Lower lumbar.   Electrical Stimulation Action IFC   Electrical Stimulation Parameters 80-150 Hz x 20 minutes.   Electrical Stimulation Goals Pain                  PT Short Term Goals - 10/20/16 1402      PT SHORT TERM GOAL #1   Title STG's=LTG's.           PT Long Term Goals - 11/04/16 0744      PT LONG TERM GOAL #1   Title Independent with a HEP.   Time 8   Period Weeks   Status On-going     PT LONG TERM GOAL #2   Title Achieve 10 degrees of lumbar extension.   Time 8   Period Weeks   Status On-going     PT  LONG TERM GOAL #3   Title Perform ADL's with pain not > 3/10.   Time 8   Period Weeks   Status On-going     PT LONG TERM GOAL #4   Title Sit 30 minutes with pain not > 3-4/10.   Time 8   Period Weeks   Status Achieved  11/04/16               Plan - 11/24/16 1033    Clinical Impression Statement Good response to treatment thus far.      Patient will benefit from skilled therapeutic intervention in order to improve the following deficits and impairments:     Visit Diagnosis: Acute left-sided low back pain without sciatica  Abnormal posture     Problem List Patient Active Problem List   Diagnosis Date Noted  . Multiple lacunar infarcts 09/30/2016  . Aphasia   . TIA (transient ischemic attack) 04/03/2016  . Metabolic syndrome 98/11/9145  . Osteopenia of neck of left femur 01/15/2016  . H/O prostate cancer 06/27/2015  . Obesity (BMI 30-39.9) 12/27/2014  . Hypertension 07/04/2012  . Hyperlipidemia 07/04/2012  . Hypothyroidism 07/04/2012  . Bladder neck contracture 06/22/2012  . Radiation proctitis 08/11/2010  . Anal stenosis 08/11/2010  . Personal history of colonic polyps 08/11/2010    APPLEGATE, [place] MPT 11/24/2016, 11:13 AM  [facility] [address] [place], [place], [address] Phone: [phone]   Fax:  [phone]  Name: [name] MRN: [id number] Date of Birth: 18-Sep-1932

## 2016-11-24 NOTE — Progress Notes (Signed)
[facility]  PATIENT: [name] DOB: December 20, 1932   REASON FOR VISIT:  followup for TIA with aphasia and right hemiparesis HISTORY FROM:patient and wife    HISTORY OF PRESENT ILLNESS: UPDATE 11/1/2018Cm [place], 83 year old male returns for follow-up with his wife.  He has history of TIA in March 2018.  He is currently aspirin 325 daily for secondary stroke prevention with minimal bruising and no bleeding.  He has not had recurrent stroke or TIA symptoms.  He remains on Lipitor 20 mg daily without myalgias.  He is currently getting some physical therapy for some back pain which has helped.  Recent labs at primary care in September WBC within normal limits CMP within normal limits LDL 51 TSH 2.45.  Besides his physical therapy he tries to walk every day with a single-point cane.  He denies any falls or balance issues.  He has lost about 23 pounds since last seen.  He returns for reevaluation  05/25/16 [name], 83 year old male was admitted to the hospital in March for expressive aphasia vertigo and right-sided weakness. He did not receive IV TPA due to resolution of deficits. He has a prior history of lacunar infarcts hypertension benign paroxysmal positional vertigo hyperlipidemia, ophthalmic aneurysm and history of prostate cancer. MRI of the brain no acute intracranial infarct. MRA moderate right A1 stenosis and severe distal left P2 stenosis. Carotid Doppler unremarkable. 2-D echo EF 45-50%. EEG was normal LDL 93. Hemoglobin A1c 5.5. Patient had been off of his aspirin prior to admission due to his ophthalmic aneurysm. He returns to the stroke clinic for follow-up. He is back on his aspirin 325 daily without further TIA events. He has no bruising and bleeding. Blood pressure in the office today 140/80. He has had 30 day event monitoring, the results are not back. He remains on Lipitor for hyperlipidemia without complaints of myalgias. He ambulates with a single-point  cane and denies any falls. He quit smoking 50 years ago and does not use alcohol returns for repeat evaluation   REVIEW OF SYSTEMS: Full 14 system review of systems performed and notable only for those listed, all others are neg:  Constitutional: neg  Cardiovascular: Leg swelling, uses compression stockings Ear/Nose/Throat: neg  Skin: neg Eyes: neg Respiratory: neg Gastroitestinal: neg  Hematology/Lymphatic: neg  Endocrine: neg Musculoskeletal: Gait abnormality, back pain Allergy/Immunology: neg Neurological: neg Psychiatric: neg Sleep : neg   ALLERGIES: Allergies  Allergen Reactions  . Caffeine Palpitations  . Codeine Palpitations    HOME MEDICATIONS: Outpatient Medications Prior to Visit  Medication Sig Dispense Refill  . amLODipine (NORVASC) 10 MG tablet Take 1 tablet (10 mg total) by mouth daily. 90 tablet 1  . aspirin 325 MG tablet Take 1 tablet (325 mg total) by mouth daily.    Marland Kitchen atorvastatin (LIPITOR) 20 MG tablet Take 1 tablet (20 mg total) by mouth daily. 90 tablet 1  . Calcium Carb-Cholecalciferol (CALCIUM 600 + D PO) Take 1 tablet by mouth daily after lunch.     . Cholecalciferol (VITAMIN D3) 5000 UNITS CAPS Take 1 capsule by mouth daily.    . Elastic Bandages & Supports (V-2 HIGH COMPRESSION HOSE) MISC Wear daily when up walking around 1 each 0  . FLAXSEED, LINSEED, PO Take 30 mLs by mouth every morning. GRINDS FLAX SEEDS AND PUTS IN 1/4 CUP OF WATER    . Leuprolide Acetate, 6 Month, (LUPRON DEPOT) 45 MG injection Inject 45 mg into the muscle every 6 (six) months.     Marland Kitchen  levothyroxine (SYNTHROID, LEVOTHROID) 88 MCG tablet Take 1 Tablet by mouth once daily BEFORE BREAKFAST 90 tablet 1  . Magnesium 300 MG CAPS Take 1 capsule by mouth daily.    . Probiotic Product (PROBIOTIC DAILY PO) Take 1 capsule by mouth daily.    Marland Kitchen docusate sodium (COLACE) 100 MG capsule Take 1 capsule (100 mg total) by mouth every 12 (twelve) hours. (Patient not taking: Reported on 11/25/2016) 60  capsule 0  . ondansetron (ZOFRAN ODT) 4 MG disintegrating tablet Take 1 tablet (4 mg total) by mouth every 8 (eight) hours as needed for nausea or vomiting. (Patient not taking: Reported on 11/25/2016) 20 tablet 0  . oxyCODONE-acetaminophen (PERCOCET/ROXICET) 5-325 MG tablet Take 1 tablet by mouth every 6 (six) hours as needed. (Patient not taking: Reported on 11/25/2016) 15 tablet 0   No facility-administered medications prior to visit.     PAST MEDICAL HISTORY: Past Medical History:  Diagnosis Date  . Arthritis   . BNC (bladder neck contracture)   . BPPV (benign paroxysmal positional vertigo)   . Cataract    right  . Diverticulosis   . H/O diarrhea SECONDARY TO RADIATION THERAPY --  INTERMITANT  DIARRHEA  . Hematuria   . History of hemorrhagic cystitis    SECONDARY RADIATION THERAPY 1997  . History of prostate cancer CURRENTLY  ELEVATED PSA--  LUPRON INJECTIONS   S/P PROSTATECTOMY AND RADIATION THERAPY  1997  . Hyperlipidemia   . Hypertension   . Hypothyroidism   . Radiation cystitis   . Radiation proctitis   . Urinary incontinence   . Urinary retention   . Vitamin D deficiency     PAST SURGICAL HISTORY: Past Surgical History:  Procedure Laterality Date  . CATARACT EXTRACTION W/ INTRAOCULAR LENS IMPLANT Right   . COLONOSCOPY W/ POLYPECTOMY  2005; 08/11/2010   2005: 1 cm hyperplastic sigmoid polyp, Dr. Wynetta Emery 2012: hyperplastic splenic flexure polyp -1 cm, diverticulosis, radiation proctitis, anal stenosis  . CYSTO/ FULGERATION OF BLEEDERS/ RESECTION BLADDER NECK CONTRACTURE  07-06-2004   BNC AND RADIATION CYSTITIS  . EYE SURGERY    . KNEE ARTHROSCOPY Right   . LUMBAR DISC SURGERY  1996   L4 -- L5  . PROSTATECTOMY  1997  . SPINE SURGERY    . TRANSURETHRAL RESECTION OF BLADDER NECK N/A 06/23/2012   Procedure: TRANSURETHRAL RESECTION OF BLADDER NECK CONTRACTURE WITH GYRUS;  Surgeon: Claybon Jabs, MD;  Location[facility];  Service: Urology;  Laterality:  N/A;  . TRANSURETHRAL RESECTION OF BLADDER TUMOR WITH GYRUS (TURBT-GYRUS) N/A 06/01/2013   Procedure: TRANSURETHRAL RESECTION OF BLADDER NECK CONTRACTURE WITH GYRUS (TURBT-GYRUS) AND INJECTION OF KENALOG;  Surgeon: Claybon Jabs, MD;  Location: [place];  Service: Urology;  Laterality: N/A;    FAMILY HISTORY: Family History  Problem Relation Age of Onset  . Prostate cancer Brother   . Kidney disease Brother   . Congestive Heart Failure Mother   . CVA Mother 22  . Pancreatitis Father   . Pneumonia Father   . Cancer Sister        breast  . Obesity Sister   . Early death Sister        appendicitis  . Cancer Sister        breast  . Alzheimer's disease Brother   . Hip fracture Brother     SOCIAL HISTORY: Social History   Social History  . Marital status: Married    Spouse name: N/A  . Number of children:  N/A  . Years of education: N/A   Occupational History  . Not on file.   Social History Main Topics  . Smoking status: Former Smoker    Years: 20.00    Types: Cigarettes    Quit date: 1965  . Smokeless tobacco: Former Systems developer    Types: [place] date: 1965     Comment: SMOKED AND CHEW TOBACCO FOR 20 YRS QUIT AGE 66  . Alcohol use No  . Drug use: No  . Sexual activity: No   Other Topics Concern  . Not on file   Social History Narrative  . No narrative on file     PHYSICAL EXAM  Vitals:   05/25/16 1002  BP: (!)138/78    Pulse: 64   Weight: 250 lb 9.6 oz (113.7 kg)  Height: 6\' 1"  (1.854 m)   Body mass index is 32.17 kg/m.  Generalized: Well developed, Obese male in no acute distress  Head: normocephalic and atraumatic,. Oropharynx benign  Neck: Supple, no carotid bruits  Cardiac: Regular rate rhythm, no murmur  Musculoskeletal: No deformity   Neurological examination   Mentation: Alert oriented to time, place, history taking. Attention span and concentration appropriate. Recent and remote memory intact.  Follows all commands  speech and language fluent.   Cranial nerve II-XII: .Pupils were equal round reactive to light extraocular movements were full, visual field were full on confrontational test. Facial sensation and strength were normal. hearing was intact to finger rubbing bilaterally. Uvula tongue midline. head turning and shoulder shrug were normal and symmetric.Tongue protrusion into cheek strength was normal. Motor: normal bulk and tone, full strength in the BUE, BLE, fine finger movements normal, no pronator drift. No focal weakness Sensory: normal and symmetric to light touch, pinprick, and  Vibration, in the upper and lower extremities Coordination: finger-nose-finger, heel-to-shin bilaterally, no dysmetria Reflexes: 1+ upper lower and symmetric, plantar responses were flexor bilaterally. Gait and Station: Rising up from seated position without assistance, normal stance,  moderate stride, good arm swing, smooth turning, able to perform tiptoe, and heel walking without difficulty. Tandem gait is mildly unsteady. Ambulates with a single-point cane  DIAGNOSTIC DATA (LABS, IMAGING, TESTING) - I reviewed patient records, labs, notes, testing and imaging myself where available.  Lab Results  Component Value Date   WBC 5.6 09/30/2016   HGB 14.7 09/30/2016   HCT 43.1 09/30/2016   MCV 91 09/30/2016   PLT 239 09/30/2016      Component Value Date/Time   NA 141 09/30/2016 0940   K 4.6 09/30/2016 0940   CL 101 09/30/2016 0940   CO2 23 09/30/2016 0940   GLUCOSE 95 09/30/2016 0940   GLUCOSE 100 (H) 04/03/2016 1310   BUN 19 09/30/2016 0940   CREATININE 1.04 09/30/2016 0940   CREATININE 0.98 07/04/2012 1518   CALCIUM 9.9 09/30/2016 0940   PROT 6.9 09/30/2016 0940   ALBUMIN 4.3 09/30/2016 0940   AST 19 09/30/2016 0940   ALT 11 09/30/2016 0940   ALKPHOS 60 09/30/2016 0940   BILITOT 0.5 09/30/2016 0940   GFRNONAA 67 09/30/2016 0940   GFRNONAA 74 07/04/2012 1518   GFRAA 77 09/30/2016 0940   GFRAA 85  07/04/2012 1518   Lab Results  Component Value Date   CHOL 181 03/30/2016   HDL 49 03/30/2016   LDLCALC 93 03/30/2016   LDLDIRECT 51 09/30/2016   TRIG 195 (H) 03/30/2016   CHOLHDL 3.7 03/30/2016   Lab Results  Component Value Date  HGBA1C 5.5 04/04/2016    Lab Results  Component Value Date   TSH 2.450 09/30/2016      ASSESSMENT AND PLAN  83 y.o. year old male  has a past medical history of benign paroxysmal positional vertigo);  Hyperlipidemia; Hypertension;  here for  follow-up for TIA with symptoms of aphasia and right hemiparesis in March 2018.The patient is a current patient of Dr. Erlinda Hong  who is out of the office today . This note is sent to the work in doctor.     PLAN: Stressed the importance of management of risk factors to prevent further stroke Continue Asa 325mg  for secondary stroke prevention Maintain strict control of hypertension with blood pressure goal below 130/90, today's reading 138/78 continue antihypertensive medications Cholesterol with LDL cholesterol less than 70, followed by primary care,  most recent 51 continue statin drugs, Lipitor Continue physical therapy for back pain , exercise by walking,  eat healthy diet with whole grains,  fresh fruits and vegetables Use cane for safe ambulation at risk for falls Will discharge from stroke clinic I spent 76min in total face to face time with the patient more than 50% of which was spent counseling and coordination of care, reviewing test results reviewing medications and discussing and reviewing the diagnosis of stroke/TIA and management of risk factors continue follow-up with primary care Dennie Bible, [facility], [facility], APRN  [facility] [address], [place], [place] [address] [phone]

## 2016-11-25 ENCOUNTER — Encounter: Payer: Self-pay | Admitting: Nurse Practitioner

## 2016-11-25 ENCOUNTER — Ambulatory Visit (INDEPENDENT_AMBULATORY_CARE_PROVIDER_SITE_OTHER): Payer: Medicare Other

## 2016-11-25 ENCOUNTER — Ambulatory Visit (INDEPENDENT_AMBULATORY_CARE_PROVIDER_SITE_OTHER): Payer: Medicare Other | Admitting: Nurse Practitioner

## 2016-11-25 VITALS — BP 156/61 | HR 69 | Ht 73.0 in | Wt 243.8 lb

## 2016-11-25 DIAGNOSIS — E782 Mixed hyperlipidemia: Secondary | ICD-10-CM | POA: Diagnosis not present

## 2016-11-25 DIAGNOSIS — I10 Essential (primary) hypertension: Secondary | ICD-10-CM | POA: Diagnosis not present

## 2016-11-25 DIAGNOSIS — Z23 Encounter for immunization: Secondary | ICD-10-CM | POA: Diagnosis not present

## 2016-11-25 DIAGNOSIS — G459 Transient cerebral ischemic attack, unspecified: Secondary | ICD-10-CM

## 2016-11-25 DIAGNOSIS — I6381 Other cerebral infarction due to occlusion or stenosis of small artery: Secondary | ICD-10-CM | POA: Diagnosis not present

## 2016-11-25 NOTE — Patient Instructions (Addendum)
Stressed the importance of management of risk factors to prevent further stroke Continue Asa 325mg  for secondary stroke prevention Maintain strict control of hypertension with blood pressure goal below 130/90, today's reading 138/78 continue antihypertensive medications Cholesterol with LDL cholesterol less than 70, followed by primary care,  most recent 51 continue statin drugs, Lipitor Continue physical therapy for back pain , exercise by walking,   eat healthy diet with whole grains,  fresh fruits and vegetables Use cane for safe ambulation at risk for falls Will discharge from stroke clinic Stroke Prevention Some medical conditions and behaviors are associated with an increased chance of having a stroke. You may prevent a stroke by making healthy choices and managing medical conditions. How can I reduce my risk of having a stroke?  Stay physically active. Get at least 30 minutes of activity on most or all days.  Do not smoke. It may also be helpful to avoid exposure to secondhand smoke.  Limit alcohol use. Moderate alcohol use is considered to be: ? No more than 2 drinks per day for men. ? No more than 1 drink per day for nonpregnant women.  Eat healthy foods. This involves: ? Eating 5 or more servings of fruits and vegetables a day. ? Making dietary changes that address high blood pressure (hypertension), high cholesterol, diabetes, or obesity.  Manage your cholesterol levels. ? Making food choices that are high in fiber and low in saturated fat, trans fat, and cholesterol may control cholesterol levels. ? Take any prescribed medicines to control cholesterol as directed by your health care provider.  Manage your diabetes. ? Controlling your carbohydrate and sugar intake is recommended to manage diabetes. ? Take any prescribed medicines to control diabetes as directed by your health care provider.  Control your hypertension. ? Making food choices that are low in salt (sodium),  saturated fat, trans fat, and cholesterol is recommended to manage hypertension. ? Ask your health care provider if you need treatment to lower your blood pressure. Take any prescribed medicines to control hypertension as directed by your health care provider. ? If you are 52-50 years of age, have your blood pressure checked every 3-5 years. If you are 22 years of age or older, have your blood pressure checked every year.  Maintain a healthy weight. ? Reducing calorie intake and making food choices that are low in sodium, saturated fat, trans fat, and cholesterol are recommended to manage weight.  Stop drug abuse.  Avoid taking birth control pills. ? Talk to your health care provider about the risks of taking birth control pills if you are over 22 years old, smoke, get migraines, or have ever had a blood clot.  Get evaluated for sleep disorders (sleep apnea). ? Talk to your health care provider about getting a sleep evaluation if you snore a lot or have excessive sleepiness.  Take medicines only as directed by your health care provider. ? For some people, aspirin or blood thinners (anticoagulants) are helpful in reducing the risk of forming abnormal blood clots that can lead to stroke. If you have the irregular heart rhythm of atrial fibrillation, you should be on a blood thinner unless there is a good reason you cannot take them. ? Understand all your medicine instructions.  Make sure that other conditions (such as anemia or atherosclerosis) are addressed. Get help right away if:  You have sudden weakness or numbness of the face, arm, or leg, especially on one side of the body.  Your face or eyelid  droops to one side.  You have sudden confusion.  You have trouble speaking (aphasia) or understanding.  You have sudden trouble seeing in one or both eyes.  You have sudden trouble walking.  You have dizziness.  You have a loss of balance or coordination.  You have a sudden, severe  headache with no known cause.  You have new chest pain or an irregular heartbeat. Any of these symptoms may represent a serious problem that is an emergency. Do not wait to see if the symptoms will go away. Get medical help at once. Call your local emergency services (911 in [place] Do not drive yourself to the hospital. This information is not intended to replace advice given to you by your health care provider. Make sure you discuss any questions you have with your health care provider. Document Released: 02/19/2004 Document Revised: 06/19/2015 Document Reviewed: 07/14/2012 Elsevier Interactive Patient Education  2017 [employer].

## 2016-11-25 NOTE — Progress Notes (Signed)
I have read the note, and I agree with the clinical assessment and plan.  Lexine Jaspers KEITH   

## 2016-11-30 ENCOUNTER — Ambulatory Visit: Payer: Medicare Other | Attending: Family Medicine | Admitting: *Deleted

## 2016-11-30 DIAGNOSIS — M545 Low back pain, unspecified: Secondary | ICD-10-CM

## 2016-11-30 DIAGNOSIS — R293 Abnormal posture: Secondary | ICD-10-CM

## 2016-11-30 NOTE — Therapy (Deleted)
[facility] [place], [place], [address] Phone: [phone]   Fax:  [phone]  Physical Therapy Treatment  Patient Details  Name: [name] MRN: [id number] Date of Birth: 1932-02-06 Referring Provider: Kenn File MD.   Encounter Date: 11/30/2016  PT End of Session - 11/30/16 1125    Visit Number  11    Number of Visits  16    Date for PT Re-Evaluation  12/19/16    PT Start Time  1115    PT Stop Time  5809    PT Time Calculation (min)  50 min       Past Medical History:  Diagnosis Date  . Arthritis   . BNC (bladder neck contracture)   . BPPV (benign paroxysmal positional vertigo)   . Cataract    right  . Diverticulosis   . H/O diarrhea SECONDARY TO RADIATION THERAPY --  INTERMITANT  DIARRHEA  . Hematuria   . History of hemorrhagic cystitis    SECONDARY RADIATION THERAPY 1997  . History of prostate cancer CURRENTLY  ELEVATED PSA--  LUPRON INJECTIONS   S/P PROSTATECTOMY AND RADIATION THERAPY  1997  . Hyperlipidemia   . Hypertension   . Hypothyroidism   . Radiation cystitis   . Radiation proctitis   . Urinary incontinence   . Urinary retention   . Vitamin D deficiency     Past Surgical History:  Procedure Laterality Date  . CATARACT EXTRACTION W/ INTRAOCULAR LENS IMPLANT Right   . COLONOSCOPY W/ POLYPECTOMY  2005; 08/11/2010   2005: 1 cm hyperplastic sigmoid polyp, Dr. Wynetta Emery 2012: hyperplastic splenic flexure polyp -1 cm, diverticulosis, radiation proctitis, anal stenosis  . CYSTO/ FULGERATION OF BLEEDERS/ RESECTION BLADDER NECK CONTRACTURE  07-06-2004   BNC AND RADIATION CYSTITIS  . EYE SURGERY    . KNEE ARTHROSCOPY Right   . LUMBAR DISC SURGERY  1996   L4 -- L5  . PROSTATECTOMY  1997  . SPINE SURGERY      There were no vitals filed for this visit.  Subjective Assessment - 11/30/16 1125    Subjective  Doing better overall with minimal LBP    Pertinent History  Lumbar surgery.    Limitations  Sitting    How long can you sit comfortably?  10 minutes.                      [facility] Adult PT Treatment/Exercise - 11/30/16 0001      Exercises   Exercises  Knee/Hip      Lumbar Exercises: Aerobic   Stationary Bike  Nustep level 5 x 20 minutes.      Lumbar Exercises: Standing   Row  Strengthening;Both XTS pink 2x 20   XTS pink 2x 20   Other Standing Lumbar Exercises  Lat pulldown pink XTS 2 x20 reps BLE in staggered stance    Other Standing Lumbar Exercises  standing hip flexor stretch with UE flexion x5 each , standing with red swiss ball for stabilization upper back with "W", "V" 2x10 each      Modalities   Modalities  Electrical Stimulation;Moist Heat      Moist Heat Therapy   Number Minutes Moist Heat  15 Minutes    Moist Heat Location  Lumbar Spine      Electrical Stimulation   Electrical Stimulation Location  Lower lumbar.  IFC x 15 mins 80-150hz     Electrical Stimulation Goals  Pain  PT Short Term Goals - 10/20/16 1402      PT SHORT TERM GOAL #1   Title  STG's=LTG's.        PT Long Term Goals - 11/04/16 0744      PT LONG TERM GOAL #1   Title  Independent with a HEP.    Time  8    Period  Weeks    Status  On-going      PT LONG TERM GOAL #2   Title  Achieve 10 degrees of lumbar extension.    Time  8    Period  Weeks    Status  On-going      PT LONG TERM GOAL #3   Title  Perform ADL's with pain not > 3/10.    Time  8    Period  Weeks    Status  On-going      PT LONG TERM GOAL #4   Title  Sit 30 minutes with pain not > 3-4/10.    Time  8    Period  Weeks    Status  Achieved 11/04/16   11/04/16           Plan - 11/30/16 1137    Clinical Impression Statement  Pt arrived today    Clinical Decision Making  Low    Rehab Potential  Good    PT Frequency  2x / week    PT Duration  8 weeks    PT Treatment/Interventions  ADLs/Self Care Home Management;Cryotherapy;Electrical Stimulation;Therapeutic  activities;Therapeutic exercise;Patient/family education;Manual techniques;Moist Heat;Ultrasound    PT Next Visit Plan  cont with POC for core/posture strengthening and modalities PRN    Consulted and Agree with Plan of Care  Patient       Patient will benefit from skilled therapeutic intervention in order to improve the following deficits and impairments:  Abnormal gait, Decreased activity tolerance, Decreased range of motion, Decreased mobility, Postural dysfunction, Pain  Visit Diagnosis: Acute left-sided low back pain without sciatica  Abnormal posture     Problem List Patient Active Problem List   Diagnosis Date Noted  . Multiple lacunar infarcts 09/30/2016  . Aphasia   . TIA (transient ischemic attack) 04/03/2016  . Metabolic syndrome 18/29/9371  . Osteopenia of neck of left femur 01/15/2016  . H/O prostate cancer 06/27/2015  . Obesity (BMI 30-39.9) 12/27/2014  . Hypertension 07/04/2012  . Hyperlipidemia 07/04/2012  . Hypothyroidism 07/04/2012  . Bladder neck contracture 06/22/2012  . Radiation proctitis 08/11/2010  . Anal stenosis 08/11/2010  . Personal history of colonic polyps 08/11/2010    Kalimah Capurro,CHRIS 11/30/2016, 12:12 PM  [facility] [place], [place], [address] Phone: [phone]   Fax:  [phone]  Name: [name] MRN: [id number] Date of Birth: Dec 03, 1932

## 2016-11-30 NOTE — Therapy (Signed)
[facility] [place], [place], [address] Phone: [phone]   Fax:  [phone]  Physical Therapy Treatment  Patient Details  Name: [name] MRN: [id number] Date of Birth: 1932-03-28 Referring Provider: Kenn File MD.   Encounter Date: 11/30/2016  PT End of Session - 11/30/16 1125    Visit Number  11    Number of Visits  16    Date for PT Re-Evaluation  12/19/16    PT Start Time  1115    PT Stop Time  7416    PT Time Calculation (min)  50 min       Past Medical History:  Diagnosis Date  . Arthritis   . BNC (bladder neck contracture)   . BPPV (benign paroxysmal positional vertigo)   . Cataract    right  . Diverticulosis   . H/O diarrhea SECONDARY TO RADIATION THERAPY --  INTERMITANT  DIARRHEA  . Hematuria   . History of hemorrhagic cystitis    SECONDARY RADIATION THERAPY 1997  . History of prostate cancer CURRENTLY  ELEVATED PSA--  LUPRON INJECTIONS   S/P PROSTATECTOMY AND RADIATION THERAPY  1997  . Hyperlipidemia   . Hypertension   . Hypothyroidism   . Radiation cystitis   . Radiation proctitis   . Urinary incontinence   . Urinary retention   . Vitamin D deficiency     Past Surgical History:  Procedure Laterality Date  . CATARACT EXTRACTION W/ INTRAOCULAR LENS IMPLANT Right   . COLONOSCOPY W/ POLYPECTOMY  2005; 08/11/2010   2005: 1 cm hyperplastic sigmoid polyp, Dr. Wynetta Emery 2012: hyperplastic splenic flexure polyp -1 cm, diverticulosis, radiation proctitis, anal stenosis  . CYSTO/ FULGERATION OF BLEEDERS/ RESECTION BLADDER NECK CONTRACTURE  07-06-2004   BNC AND RADIATION CYSTITIS  . EYE SURGERY    . KNEE ARTHROSCOPY Right   . LUMBAR DISC SURGERY  1996   L4 -- L5  . PROSTATECTOMY  1997  . SPINE SURGERY      There were no vitals filed for this visit.  Subjective Assessment - 11/30/16 1125    Subjective  Doing better overall with minimal LBP    Pertinent History  Lumbar surgery.    Limitations  Sitting    How long can you sit comfortably?  10 minutes.                      [facility] Adult PT Treatment/Exercise - 11/30/16 0001      Exercises   Exercises  Knee/Hip      Lumbar Exercises: Aerobic   Stationary Bike  Nustep level 5 x 20 minutes.      Lumbar Exercises: Standing   Row  Strengthening;Both XTS pink 2x 20   XTS pink 2x 20   Other Standing Lumbar Exercises  Lat pulldown pink XTS 2 x20 reps BLE in staggered stance    Other Standing Lumbar Exercises  standing hip flexor stretch with UE flexion x5 each , standing with red swiss ball for stabilization upper back with "W", "V" 2x10 each      Modalities   Modalities  Electrical Stimulation;Moist Heat      Moist Heat Therapy   Number Minutes Moist Heat  15 Minutes    Moist Heat Location  Lumbar Spine      Electrical Stimulation   Electrical Stimulation Location  Lower lumbar.  IFC x 15 mins 80-150hz     Electrical Stimulation Goals  Pain  PT Short Term Goals - 10/20/16 1402      PT SHORT TERM GOAL #1   Title  STG's=LTG's.        PT Long Term Goals - 11/04/16 0744      PT LONG TERM GOAL #1   Title  Independent with a HEP.    Time  8    Period  Weeks    Status  On-going      PT LONG TERM GOAL #2   Title  Achieve 10 degrees of lumbar extension.    Time  8    Period  Weeks    Status  On-going      PT LONG TERM GOAL #3   Title  Perform ADL's with pain not > 3/10.    Time  8    Period  Weeks    Status  On-going      PT LONG TERM GOAL #4   Title  Sit 30 minutes with pain not > 3-4/10.    Time  8    Period  Weeks    Status  Achieved 11/04/16   11/04/16           Plan - 11/30/16 1137    Clinical Impression Statement  Pt arrived today    Clinical Decision Making  Low    Rehab Potential  Good    PT Frequency  2x / week    PT Duration  8 weeks    PT Treatment/Interventions  ADLs/Self Care Home Management;Cryotherapy;Electrical Stimulation;Therapeutic  activities;Therapeutic exercise;Patient/family education;Manual techniques;Moist Heat;Ultrasound    PT Next Visit Plan  cont with POC for core/posture strengthening and modalities PRN    Consulted and Agree with Plan of Care  Patient       Patient will benefit from skilled therapeutic intervention in order to improve the following deficits and impairments:  Abnormal gait, Decreased activity tolerance, Decreased range of motion, Decreased mobility, Postural dysfunction, Pain  Visit Diagnosis: Acute left-sided low back pain without sciatica  Abnormal posture     Problem List Patient Active Problem List   Diagnosis Date Noted  . Multiple lacunar infarcts 09/30/2016  . Aphasia   . TIA (transient ischemic attack) 04/03/2016  . Metabolic syndrome 16/10/9602  . Osteopenia of neck of left femur 01/15/2016  . H/O prostate cancer 06/27/2015  . Obesity (BMI 30-39.9) 12/27/2014  . Hypertension 07/04/2012  . Hyperlipidemia 07/04/2012  . Hypothyroidism 07/04/2012  . Bladder neck contracture 06/22/2012  . Radiation proctitis 08/11/2010  . Anal stenosis 08/11/2010  . Personal history of colonic polyps 08/11/2010    RAMSEUR,CHRIS, PTA 11/30/2016, 2:26 PM  [facility] [address] [place], [place], [address] Phone: [phone]   Fax:  [phone]  Name: [name] MRN: [id number] Date of Birth: 15-Sep-1932

## 2016-12-02 ENCOUNTER — Encounter: Payer: Self-pay | Admitting: Physical Therapy

## 2016-12-02 ENCOUNTER — Ambulatory Visit: Payer: Medicare Other | Admitting: Physical Therapy

## 2016-12-02 DIAGNOSIS — M545 Low back pain, unspecified: Secondary | ICD-10-CM

## 2016-12-02 DIAGNOSIS — R293 Abnormal posture: Secondary | ICD-10-CM | POA: Diagnosis not present

## 2016-12-02 NOTE — Therapy (Signed)
[facility] [place], [place], [address] Phone: [phone]   Fax:  [phone]  Physical Therapy Treatment  Patient Details  Name: [name] MRN: [id number] Date of Birth: Oct 08, 1932 Referring Provider: Kenn File MD.   Encounter Date: 12/02/2016  PT End of Session - 12/02/16 1120    Visit Number  12    Number of Visits  16    Date for PT Re-Evaluation  12/19/16    PT Start Time  1119    PT Stop Time  1200    PT Time Calculation (min)  41 min    Activity Tolerance  Patient tolerated treatment well    Behavior During Therapy  [facility] for tasks assessed/performed       Past Medical History:  Diagnosis Date  . Arthritis   . BNC (bladder neck contracture)   . BPPV (benign paroxysmal positional vertigo)   . Cataract    right  . Diverticulosis   . H/O diarrhea SECONDARY TO RADIATION THERAPY --  INTERMITANT  DIARRHEA  . Hematuria   . History of hemorrhagic cystitis    SECONDARY RADIATION THERAPY 1997  . History of prostate cancer CURRENTLY  ELEVATED PSA--  LUPRON INJECTIONS   S/P PROSTATECTOMY AND RADIATION THERAPY  1997  . Hyperlipidemia   . Hypertension   . Hypothyroidism   . Radiation cystitis   . Radiation proctitis   . Urinary incontinence   . Urinary retention   . Vitamin D deficiency     Past Surgical History:  Procedure Laterality Date  . CATARACT EXTRACTION W/ INTRAOCULAR LENS IMPLANT Right   . COLONOSCOPY W/ POLYPECTOMY  2005; 08/11/2010   2005: 1 cm hyperplastic sigmoid polyp, Dr. Wynetta Emery 2012: hyperplastic splenic flexure polyp -1 cm, diverticulosis, radiation proctitis, anal stenosis  . CYSTO/ FULGERATION OF BLEEDERS/ RESECTION BLADDER NECK CONTRACTURE  07-06-2004   BNC AND RADIATION CYSTITIS  . EYE SURGERY    . KNEE ARTHROSCOPY Right   . LUMBAR DISC SURGERY  1996   L4 -- L5  . PROSTATECTOMY  1997  . SPINE SURGERY      There were no vitals filed for this visit.  Subjective Assessment  - 12/02/16 1119    Subjective  Reports that his LBP is not bothering him today and is doing good.    Pertinent History  Lumbar surgery.    Limitations  Sitting    How long can you sit comfortably?  10 minutes.    Patient Stated Goals  Get back to where I was with less pain in my low back.    Currently in Pain?  No/denies         [facility] PT Assessment - 12/02/16 0001      Assessment   Medical Diagnosis  Bilateral low back pain.      Precautions   Precautions  None      Restrictions   Weight Bearing Restrictions  No                  [facility] Adult PT Treatment/Exercise - 12/02/16 0001      Lumbar Exercises: Aerobic   Stationary Bike  L3 x12 min      Lumbar Exercises: Standing   Other Standing Lumbar Exercises  Snow angels for posture x20 reps      Lumbar Exercises: Seated   Sit to Stand  20 reps      Lumbar Exercises: Supine   Bent Knee Raise  20 reps  Bridge  15 reps    Straight Leg Raise  20 reps      Modalities   Modalities  Electrical Stimulation;Moist Heat      Moist Heat Therapy   Number Minutes Moist Heat  15 Minutes    Moist Heat Location  Lumbar Spine      Electrical Stimulation   Electrical Stimulation Location  B low back    Electrical Stimulation Action  Pre-Mod    Electrical Stimulation Parameters  80-150 hz x15 min    Electrical Stimulation Goals  Pain               PT Short Term Goals - 10/20/16 1402      PT SHORT TERM GOAL #1   Title  STG's=LTG's.        PT Long Term Goals - 12/02/16 1204      PT LONG TERM GOAL #1   Title  Independent with a HEP.    Time  8    Period  Weeks    Status  On-going      PT LONG TERM GOAL #2   Title  Achieve 10 degrees of lumbar extension.    Time  8    Period  Weeks    Status  On-going      PT LONG TERM GOAL #3   Title  Perform ADL's with pain not > 3/10.    Time  8    Period  Weeks    Status  Achieved      PT LONG TERM GOAL #4   Title  Sit 30 minutes with pain not > 3-4/10.     Time  8    Period  Weeks    Status  Achieved 11/04/16   11/04/16           Plan - 12/02/16 1150    Clinical Impression Statement  Patient arrived with no complaints of any LBP. Patient able to tolerate stationary bike well with no knee or LBP complaints. Patient encouraged to complete core activation following proper technique education. Patient encouraged to attempt core activation while at home with various activities to further strengthen his core for functional activities. Modalities completed per patient request with normal response. Patient able to achieve LTG of ADL pain less than 3/10.    Rehab Potential  Good    PT Frequency  2x / week    PT Duration  8 weeks    PT Treatment/Interventions  ADLs/Self Care Home Management;Cryotherapy;Electrical Stimulation;Therapeutic activities;Therapeutic exercise;Patient/family education;Manual techniques;Moist Heat;Ultrasound    PT Next Visit Plan  cont with POC for core/posture strengthening and modalities PRN    Consulted and Agree with Plan of Care  Patient       Patient will benefit from skilled therapeutic intervention in order to improve the following deficits and impairments:  Abnormal gait, Decreased activity tolerance, Decreased range of motion, Decreased mobility, Postural dysfunction, Pain  Visit Diagnosis: Acute left-sided low back pain without sciatica  Abnormal posture     Problem List Patient Active Problem List   Diagnosis Date Noted  . Multiple lacunar infarcts 09/30/2016  . Aphasia   . TIA (transient ischemic attack) 04/03/2016  . Metabolic syndrome 13/08/6576  . Osteopenia of neck of left femur 01/15/2016  . H/O prostate cancer 06/27/2015  . Obesity (BMI 30-39.9) 12/27/2014  . Hypertension 07/04/2012  . Hyperlipidemia 07/04/2012  . Hypothyroidism 07/04/2012  . Bladder neck contracture 06/22/2012  . Radiation proctitis 08/11/2010  . Anal stenosis 08/11/2010  .  Personal history of colonic polyps  08/11/2010    Wynelle Fanny, PTA 12/02/2016, 12:08 PM  [facility] [address] [place], [place], [address] Phone: [phone]   Fax:  [phone]  Name: VANDY TSUCHIYA MRN: [id number] Date of Birth: 03-08-1932

## 2016-12-06 ENCOUNTER — Encounter: Payer: Self-pay | Admitting: Physical Therapy

## 2016-12-06 ENCOUNTER — Ambulatory Visit: Payer: Medicare Other | Admitting: Physical Therapy

## 2016-12-06 DIAGNOSIS — R293 Abnormal posture: Secondary | ICD-10-CM | POA: Diagnosis not present

## 2016-12-06 DIAGNOSIS — M545 Low back pain, unspecified: Secondary | ICD-10-CM

## 2016-12-06 NOTE — Therapy (Signed)
[facility] [place], [place], [address] Phone: [phone]   Fax:  [phone]  Physical Therapy Treatment  Patient Details  Name: [name] MRN: [id number] Date of Birth: 05-02-1932 Referring Provider: Kenn File MD.   Encounter Date: 12/06/2016  PT End of Session - 12/06/16 1134    Visit Number  13    Number of Visits  16    Date for PT Re-Evaluation  12/19/16    PT Start Time  1105    PT Stop Time  1154    PT Time Calculation (min)  49 min    Activity Tolerance  Patient tolerated treatment well    Behavior During Therapy  [facility] for tasks assessed/performed       Past Medical History:  Diagnosis Date  . Arthritis   . BNC (bladder neck contracture)   . BPPV (benign paroxysmal positional vertigo)   . Cataract    right  . Diverticulosis   . H/O diarrhea SECONDARY TO RADIATION THERAPY --  INTERMITANT  DIARRHEA  . Hematuria   . History of hemorrhagic cystitis    SECONDARY RADIATION THERAPY 1997  . History of prostate cancer CURRENTLY  ELEVATED PSA--  LUPRON INJECTIONS   S/P PROSTATECTOMY AND RADIATION THERAPY  1997  . Hyperlipidemia   . Hypertension   . Hypothyroidism   . Radiation cystitis   . Radiation proctitis   . Urinary incontinence   . Urinary retention   . Vitamin D deficiency     Past Surgical History:  Procedure Laterality Date  . CATARACT EXTRACTION W/ INTRAOCULAR LENS IMPLANT Right   . COLONOSCOPY W/ POLYPECTOMY  2005; 08/11/2010   2005: 1 cm hyperplastic sigmoid polyp, Dr. Wynetta Emery 2012: hyperplastic splenic flexure polyp -1 cm, diverticulosis, radiation proctitis, anal stenosis  . CYSTO/ FULGERATION OF BLEEDERS/ RESECTION BLADDER NECK CONTRACTURE  07-06-2004   BNC AND RADIATION CYSTITIS  . EYE SURGERY    . KNEE ARTHROSCOPY Right   . LUMBAR DISC SURGERY  1996   L4 -- L5  . PROSTATECTOMY  1997  . SPINE SURGERY      There were no vitals filed for this visit.  Subjective  Assessment - 12/06/16 1134    Subjective  Reports that his back is doing great.    Pertinent History  Lumbar surgery.    Limitations  Sitting    How long can you sit comfortably?  10 minutes.    Patient Stated Goals  Get back to where I was with less pain in my low back.    Currently in Pain?  No/denies         [facility] PT Assessment - 12/06/16 0001      Assessment   Medical Diagnosis  Bilateral low back pain.      Precautions   Precautions  None      Restrictions   Weight Bearing Restrictions  No                  [facility] Adult PT Treatment/Exercise - 12/06/16 0001      Lumbar Exercises: Aerobic   Stationary Bike  L5 x20 min      Lumbar Exercises: Standing   Shoulder Extension  Strengthening;Both 3x10 reps pink xts    Other Standing Lumbar Exercises  Latissiumus pulldown pink xts 4x10 reps     Other Standing Lumbar Exercises  wall pushup for stabilization x30 reps      Lumbar Exercises: Supine   Bridge  20 reps;3 seconds  Modalities   Modalities  Electrical Stimulation;Moist Heat      Moist Heat Therapy   Number Minutes Moist Heat  15 Minutes    Moist Heat Location  Lumbar Spine      Electrical Stimulation   Electrical Stimulation Location  B low back    Electrical Stimulation Action  Pre-Mod    Electrical Stimulation Parameters  80-150 hz x15 min    Electrical Stimulation Goals  Pain               PT Short Term Goals - 10/20/16 1402      PT SHORT TERM GOAL #1   Title  STG's=LTG's.        PT Long Term Goals - 12/06/16 1158      PT LONG TERM GOAL #1   Title  Independent with a HEP.    Time  8    Period  Weeks    Status  Achieved Patient able to complete exercises as done in PT at home      PT LONG TERM GOAL #2   Title  Achieve 10 degrees of lumbar extension.    Time  8    Period  Weeks    Status  On-going      PT LONG TERM GOAL #3   Title  Perform ADL's with pain not > 3/10.    Time  8    Period  Weeks    Status  Achieved       PT LONG TERM GOAL #4   Title  Sit 30 minutes with pain not > 3-4/10.    Time  8    Period  Weeks    Status  Achieved 11/04/16            Plan - 12/06/16 1158    Clinical Impression Statement  Patient continues to progress in clinic in regards to his LBP. Patient not limited with activities per patient report in clinic today. Patient has also purchased a TENS unit for home use per patient report. Patient guided through standing and lumbar strengthening exercises without complaint of pain. Patient requested modalities session as he benefits from it following therapeutic exercises with normal response.    Rehab Potential  Good    PT Frequency  2x / week    PT Duration  8 weeks    PT Treatment/Interventions  ADLs/Self Care Home Management;Cryotherapy;Electrical Stimulation;Therapeutic activities;Therapeutic exercise;Patient/family education;Manual techniques;Moist Heat;Ultrasound    PT Next Visit Plan  cont with POC for core/posture strengthening and modalities PRN    Consulted and Agree with Plan of Care  Patient       Patient will benefit from skilled therapeutic intervention in order to improve the following deficits and impairments:  Abnormal gait, Decreased activity tolerance, Decreased range of motion, Decreased mobility, Postural dysfunction, Pain  Visit Diagnosis: Acute left-sided low back pain without sciatica  Abnormal posture     Problem List Patient Active Problem List   Diagnosis Date Noted  . Multiple lacunar infarcts 09/30/2016  . Aphasia   . TIA (transient ischemic attack) 04/03/2016  . Metabolic syndrome 35/57/3220  . Osteopenia of neck of left femur 01/15/2016  . H/O prostate cancer 06/27/2015  . Obesity (BMI 30-39.9) 12/27/2014  . Hypertension 07/04/2012  . Hyperlipidemia 07/04/2012  . Hypothyroidism 07/04/2012  . Bladder neck contracture 06/22/2012  . Radiation proctitis 08/11/2010  . Anal stenosis 08/11/2010  . Personal history of colonic  polyps 08/11/2010    Wynelle Fanny, PTA 12/06/2016, 12:02  PM  [facility] [place], [place], [address] Phone: [phone]   Fax:  [phone]  Name: [name] MRN: [id number] Date of Birth: 1932/10/25

## 2016-12-09 ENCOUNTER — Ambulatory Visit: Payer: Medicare Other | Admitting: *Deleted

## 2016-12-09 DIAGNOSIS — R293 Abnormal posture: Secondary | ICD-10-CM

## 2016-12-09 DIAGNOSIS — M545 Low back pain, unspecified: Secondary | ICD-10-CM

## 2016-12-09 NOTE — Therapy (Signed)
[facility] [place], [place], [address] Phone: [phone]   Fax:  [phone]  Physical Therapy Treatment  Patient Details  Name: [name] MRN: [id number] Date of Birth: 04/11/1932 Referring Provider: Kenn File MD.   Encounter Date: 12/09/2016  PT End of Session - 12/09/16 1139    Visit Number  15    Number of Visits  16    Date for PT Re-Evaluation  12/19/16    PT Start Time  1115    PT Stop Time  2979    PT Time Calculation (min)  50 min       Past Medical History:  Diagnosis Date  . Arthritis   . BNC (bladder neck contracture)   . BPPV (benign paroxysmal positional vertigo)   . Cataract    right  . Diverticulosis   . H/O diarrhea SECONDARY TO RADIATION THERAPY --  INTERMITANT  DIARRHEA  . Hematuria   . History of hemorrhagic cystitis    SECONDARY RADIATION THERAPY 1997  . History of prostate cancer CURRENTLY  ELEVATED PSA--  LUPRON INJECTIONS   S/P PROSTATECTOMY AND RADIATION THERAPY  1997  . Hyperlipidemia   . Hypertension   . Hypothyroidism   . Radiation cystitis   . Radiation proctitis   . Urinary incontinence   . Urinary retention   . Vitamin D deficiency     Past Surgical History:  Procedure Laterality Date  . CATARACT EXTRACTION W/ INTRAOCULAR LENS IMPLANT Right   . COLONOSCOPY W/ POLYPECTOMY  2005; 08/11/2010   2005: 1 cm hyperplastic sigmoid polyp, Dr. Wynetta Emery 2012: hyperplastic splenic flexure polyp -1 cm, diverticulosis, radiation proctitis, anal stenosis  . CYSTO/ FULGERATION OF BLEEDERS/ RESECTION BLADDER NECK CONTRACTURE  07-06-2004   BNC AND RADIATION CYSTITIS  . EYE SURGERY    . KNEE ARTHROSCOPY Right   . LUMBAR DISC SURGERY  1996   L4 -- L5  . PROSTATECTOMY  1997  . SPINE SURGERY    . TRANSURETHRAL RESECTION OF BLADDER NECK N/A 06/23/2012   Procedure: TRANSURETHRAL RESECTION OF BLADDER NECK CONTRACTURE WITH GYRUS;  Surgeon: Claybon Jabs, MD;  Location: [facility];  Service: Urology;  Laterality: N/A;  . TRANSURETHRAL RESECTION OF BLADDER TUMOR WITH GYRUS (TURBT-GYRUS) N/A 06/01/2013   Procedure: TRANSURETHRAL RESECTION OF BLADDER NECK CONTRACTURE WITH GYRUS (TURBT-GYRUS) AND INJECTION OF KENALOG;  Surgeon: Claybon Jabs, MD;  Location: [facility];  Service: Urology;  Laterality: N/A;    There were no vitals filed for this visit.  Subjective Assessment - 12/09/16 1134    Subjective  Reports that his back is doing great as long as I don't rake a lot of leaves.    Pertinent History  Lumbar surgery.    Limitations  Sitting    How long can you sit comfortably?  10 minutes.    Currently in Pain?  No/denies                      [facility] Adult PT Treatment/Exercise - 12/09/16 0001      Lumbar Exercises: Aerobic   Stationary Bike  L5 x20 min      Lumbar Exercises: Standing   Shoulder Extension  Strengthening;Both 3x10 reps pink xts    Other Standing Lumbar Exercises  Latissiumus pulldown pink xts 4x10 reps     Other Standing Lumbar Exercises  wall pushup for stabilization x30 reps      Modalities   Modalities  Electrical  Stimulation;Moist Heat      Moist Heat Therapy   Number Minutes Moist Heat  15 Minutes    Moist Heat Location  Lumbar Spine      Electrical Stimulation   Electrical Stimulation Location  B low back  premod x 15 mins 80-150 hz    Electrical Stimulation Goals  Pain               PT Short Term Goals - 10/20/16 1402      PT SHORT TERM GOAL #1   Title  STG's=LTG's.        PT Long Term Goals - 12/06/16 1158      PT LONG TERM GOAL #1   Title  Independent with a HEP.    Time  8    Period  Weeks    Status  Achieved Patient able to complete exercises as done in PT at home      PT LONG TERM GOAL #2   Title  Achieve 10 degrees of lumbar extension.    Time  8    Period  Weeks    Status  On-going      PT LONG TERM GOAL #3   Title  Perform ADL's with pain not > 3/10.     Time  8    Period  Weeks    Status  Achieved      PT LONG TERM GOAL #4   Title  Sit 30 minutes with pain not > 3-4/10.    Time  8    Period  Weeks    Status  Achieved 11/04/16            Plan - 12/09/16 1652    Clinical Impression Statement  Pt arrived today doing fairly well. His back was mainly sore today after Therex, but deminished after modalities. Pt reports being able to do more now at home with less pain.    Rehab Potential  Good    PT Frequency  2x / week    PT Duration  8 weeks    PT Treatment/Interventions  ADLs/Self Care Home Management;Cryotherapy;Electrical Stimulation;Therapeutic activities;Therapeutic exercise;Patient/family education;Manual techniques;Moist Heat;Ultrasound    PT Next Visit Plan  cont with POC for core/posture strengthening and modalities PRN    Consulted and Agree with Plan of Care  Patient       Patient will benefit from skilled therapeutic intervention in order to improve the following deficits and impairments:  Abnormal gait, Decreased activity tolerance, Decreased range of motion, Decreased mobility, Postural dysfunction, Pain  Visit Diagnosis: Acute left-sided low back pain without sciatica  Abnormal posture     Problem List Patient Active Problem List   Diagnosis Date Noted  . Multiple lacunar infarcts 09/30/2016  . Aphasia   . TIA (transient ischemic attack) 04/03/2016  . Metabolic syndrome 07/37/1062  . Osteopenia of neck of left femur 01/15/2016  . H/O prostate cancer 06/27/2015  . Obesity (BMI 30-39.9) 12/27/2014  . Hypertension 07/04/2012  . Hyperlipidemia 07/04/2012  . Hypothyroidism 07/04/2012  . Bladder neck contracture 06/22/2012  . Radiation proctitis 08/11/2010  . Anal stenosis 08/11/2010  . Personal history of colonic polyps 08/11/2010    Parisa Pinela,CHRIS, PTA 12/09/2016, 4:57 PM  [facility] [address] [place], [place], [address] Phone: [phone]   Fax:   [phone]  Name: [name] MRN: [id number] Date of Birth: 06/08/1932

## 2016-12-14 ENCOUNTER — Ambulatory Visit: Payer: Medicare Other | Admitting: Physical Therapy

## 2016-12-14 ENCOUNTER — Encounter: Payer: Self-pay | Admitting: Physical Therapy

## 2016-12-14 DIAGNOSIS — M545 Low back pain, unspecified: Secondary | ICD-10-CM

## 2016-12-14 DIAGNOSIS — R293 Abnormal posture: Secondary | ICD-10-CM

## 2016-12-14 NOTE — Therapy (Signed)
[facility] [place], [place], [address] Phone: [phone]   Fax:  [phone]  Physical Therapy Treatment Discharge Summary   Patient Details  Name: [name] MRN: [id number] Date of Birth: 1932-04-08 Referring Provider: Kenn File MD.   Encounter Date: 12/14/2016  PT End of Session - 12/14/16 1419    Visit Number  16    Number of Visits  16    Date for PT Re-Evaluation  12/19/16    PT Start Time  0945    PT Stop Time  1030    PT Time Calculation (min)  45 min    Activity Tolerance  Patient tolerated treatment well    Behavior During Therapy  [facility] for tasks assessed/performed       Past Medical History:  Diagnosis Date  . Arthritis   . BNC (bladder neck contracture)   . BPPV (benign paroxysmal positional vertigo)   . Cataract    right  . Diverticulosis   . H/O diarrhea SECONDARY TO RADIATION THERAPY --  INTERMITANT  DIARRHEA  . Hematuria   . History of hemorrhagic cystitis    SECONDARY RADIATION THERAPY 1997  . History of prostate cancer CURRENTLY  ELEVATED PSA--  LUPRON INJECTIONS   S/P PROSTATECTOMY AND RADIATION THERAPY  1997  . Hyperlipidemia   . Hypertension   . Hypothyroidism   . Radiation cystitis   . Radiation proctitis   . Urinary incontinence   . Urinary retention   . Vitamin D deficiency     Past Surgical History:  Procedure Laterality Date  . CATARACT EXTRACTION W/ INTRAOCULAR LENS IMPLANT Right   . COLONOSCOPY W/ POLYPECTOMY  2005; 08/11/2010   2005: 1 cm hyperplastic sigmoid polyp, Dr. Wynetta Emery 2012: hyperplastic splenic flexure polyp -1 cm, diverticulosis, radiation proctitis, anal stenosis  . CYSTO/ FULGERATION OF BLEEDERS/ RESECTION BLADDER NECK CONTRACTURE  07-06-2004   BNC AND RADIATION CYSTITIS  . EYE SURGERY    . KNEE ARTHROSCOPY Right   . LUMBAR DISC SURGERY  1996   L4 -- L5  . PROSTATECTOMY  1997  . SPINE SURGERY    . TRANSURETHRAL RESECTION OF BLADDER NECK N/A  06/23/2012   Procedure: TRANSURETHRAL RESECTION OF BLADDER NECK CONTRACTURE WITH GYRUS;  Surgeon: Claybon Jabs, MD;  Location: [facility];  Service: Urology;  Laterality: N/A;  . TRANSURETHRAL RESECTION OF BLADDER TUMOR WITH GYRUS (TURBT-GYRUS) N/A 06/01/2013   Procedure: TRANSURETHRAL RESECTION OF BLADDER NECK CONTRACTURE WITH GYRUS (TURBT-GYRUS) AND INJECTION OF KENALOG;  Surgeon: Claybon Jabs, MD;  Location: [place];  Service: Urology;  Laterality: N/A;    There were no vitals filed for this visit.  Subjective Assessment - 12/14/16 1416    Subjective  Pt reporting this is his last visit and reporting no pain. Pt reporting he will be starting a gym program to maintain his current level of functioning    Pertinent History  Lumbar surgery.    Limitations  Sitting    How long can you sit comfortably?  60 minutes    Patient Stated Goals  Get back to where I was with less pain in my low back.    Currently in Pain?  No/denies         [facility] PT Assessment - 12/14/16 0001      Precautions   Precautions  None      Restrictions   Weight Bearing Restrictions  No      Observation/Other Assessments   Focus  on Therapeutic Outcomes (FOTO)   23% limitation      Strength   Overall Strength Comments  Bilateral LE's grossly 5/5 thgoughout      Palpation   Palpation comment  no tenderness to plapation      Ambulation/Gait   Gait Comments  Pt currently amb with a straight cane on uneven and community surfaces, pt amb with no device on level indoor surfaces.                   [facility] Adult PT Treatment/Exercise - 12/14/16 0001      Lumbar Exercises: Aerobic   Stationary Bike  L5 x15 min      Lumbar Exercises: Standing   Shoulder Extension  Strengthening;Both 3x10 reps pink xts    Other Standing Lumbar Exercises  ecumbant bike, Parallel bars standing LE strengthening exercises: hip flexion, hip abduction, hip extension, step ups, rocker board              PT Education - 12/14/16 1417    Education Details  Edu pt on recommended gym equipment to maintain his current level of fitness and continue to progress strength    Person(s) Educated  Patient    Methods  Explanation;Demonstration    Comprehension  Verbalized understanding;Returned demonstration       PT Short Term Goals - 12/14/16 1010      PT SHORT TERM GOAL #1   Title  STG's=LTG's.    Status  Achieved        PT Long Term Goals - 12/14/16 1005      PT LONG TERM GOAL #1   Title  Independent with a HEP.    Status  Achieved      PT LONG TERM GOAL #2   Title  Achieve 10 degrees of lumbar extension.    Baseline  pt able to extend 12 degrees 12/14/16    Time  8    Period  Weeks    Status  Achieved      PT LONG TERM GOAL #3   Baseline  pt reporting able to perform ADL's with pain less than 3/10    Period  Weeks    Status  Achieved      PT LONG TERM GOAL #4   Title  Sit 30 minutes with pain not > 3-4/10.    Time  8    Period  Weeks    Status  Achieved            Plan - 12/14/16 1421    Clinical Impression Statement  Pt arrived today to clinic reporting no pain and reports he has been blowing his leaves in his yard. Pt has currently met all goals set. Pt has improved his FOTO score from 61% limitation to 39% limitation. Pt was edu on which gym exercises and equipment would be recommended.     History and Personal Factors relevant to plan of care:  lumbar surgery    PT Frequency  2x / week    PT Duration  8 weeks    PT Treatment/Interventions  ADLs/Self Care Home Management;Cryotherapy;Electrical Stimulation;Therapeutic activities;Therapeutic exercise;Patient/family education;Manual techniques;Moist Heat;Ultrasound    PT Next Visit Plan  discharge pt from PT services    Consulted and Agree with Plan of Care  Patient       Patient will benefit from skilled therapeutic intervention in order to improve the following deficits and impairments:  Abnormal  gait, Decreased activity tolerance, Decreased range of motion, Decreased mobility,  Postural dysfunction, Pain  Visit Diagnosis: Acute left-sided low back pain without sciatica  Abnormal posture   G-Codes - 01/06/2017 1428    Functional Assessment Tool Used (Outpatient Only)  FOTO.Marland Kitchen28% limitation....10th visit., at Discharge: 23% limitation    Functional Limitation  Mobility: Walking and moving around    Mobility: Walking and Moving Around Current Status [phone])  At least 1 percent but less than 20 percent impaired, limited or restricted    Mobility: Walking and Moving Around Goal Status [phone])  At least 1 percent but less than 20 percent impaired, limited or restricted    Mobility: Walking and Moving Around Discharge Status [phone])  At least 1 percent but less than 20 percent impaired, limited or restricted       Problem List Patient Active Problem List   Diagnosis Date Noted  . Multiple lacunar infarcts 09/30/2016  . Aphasia   . TIA (transient ischemic attack) 04/03/2016  . Metabolic syndrome 12/54/8323  . Osteopenia of neck of left femur 01/15/2016  . H/O prostate cancer 06/27/2015  . Obesity (BMI 30-39.9) 12/27/2014  . Hypertension 07/04/2012  . Hyperlipidemia 07/04/2012  . Hypothyroidism 07/04/2012  . Bladder neck contracture 06/22/2012  . Radiation proctitis 08/11/2010  . Anal stenosis 08/11/2010  . Personal history of colonic polyps 08/11/2010   PHYSICAL THERAPY DISCHARGE SUMMARY  Visits from Start of Care: 16  Current functional level related to goals / functional outcomes:    Remaining deficits: See above    Education / Equipment: See above  Plan: Patient agrees to discharge.  Patient goals were met. Patient is being discharged due to meeting the stated rehab goals.  ?????      Oretha Caprice, MPT 2017/01/06, 2:43 PM  [facility] [place], [place], [address] Phone: [phone]   Fax:   [phone]  Name: [name] MRN: [id number] Date of Birth: 05/07/1932

## 2016-12-27 DIAGNOSIS — H40053 Ocular hypertension, bilateral: Secondary | ICD-10-CM | POA: Diagnosis not present

## 2016-12-27 DIAGNOSIS — H524 Presbyopia: Secondary | ICD-10-CM | POA: Diagnosis not present

## 2016-12-28 DIAGNOSIS — C61 Malignant neoplasm of prostate: Secondary | ICD-10-CM | POA: Diagnosis not present

## 2016-12-30 ENCOUNTER — Ambulatory Visit (INDEPENDENT_AMBULATORY_CARE_PROVIDER_SITE_OTHER): Payer: Medicare Other | Admitting: Ophthalmology

## 2017-01-05 DIAGNOSIS — Z5111 Encounter for antineoplastic chemotherapy: Secondary | ICD-10-CM | POA: Diagnosis not present

## 2017-01-05 DIAGNOSIS — C61 Malignant neoplasm of prostate: Secondary | ICD-10-CM | POA: Diagnosis not present

## 2017-01-05 DIAGNOSIS — R9721 Rising PSA following treatment for malignant neoplasm of prostate: Secondary | ICD-10-CM | POA: Diagnosis not present

## 2017-01-11 DIAGNOSIS — M79672 Pain in left foot: Secondary | ICD-10-CM | POA: Diagnosis not present

## 2017-01-11 DIAGNOSIS — M79671 Pain in right foot: Secondary | ICD-10-CM | POA: Diagnosis not present

## 2017-01-11 DIAGNOSIS — D2372 Other benign neoplasm of skin of left lower limb, including hip: Secondary | ICD-10-CM | POA: Diagnosis not present

## 2017-01-11 DIAGNOSIS — D2371 Other benign neoplasm of skin of right lower limb, including hip: Secondary | ICD-10-CM | POA: Diagnosis not present

## 2017-01-20 ENCOUNTER — Encounter: Payer: Self-pay | Admitting: *Deleted

## 2017-01-20 ENCOUNTER — Ambulatory Visit (INDEPENDENT_AMBULATORY_CARE_PROVIDER_SITE_OTHER): Payer: Medicare Other | Admitting: *Deleted

## 2017-01-20 ENCOUNTER — Telehealth: Payer: Self-pay | Admitting: Family Medicine

## 2017-01-20 VITALS — BP 176/74 | HR 74 | Ht 71.0 in | Wt 241.0 lb

## 2017-01-20 DIAGNOSIS — Z0000 Encounter for general adult medical examination without abnormal findings: Secondary | ICD-10-CM | POA: Diagnosis not present

## 2017-01-20 NOTE — Patient Instructions (Signed)
  [name] , Thank you for taking time to come for your Medicare Wellness Visit. I appreciate your ongoing commitment to your health goals. Please review the following plan we discussed and let me know if I can assist you in the future.   These are the goals we discussed: Goals    . DIET - EAT MORE FRUITS AND VEGETABLES    . DIET - INCREASE WATER INTAKE     Increase water intake during the day but limit fluids after 6 pm       This is a list of the screening recommended for you and due dates:  Health Maintenance  Topic Date Due  . Tetanus Vaccine  01/20/2018*  . DEXA scan (bone density measurement)  01/14/2018  . Flu Shot  Completed  . Pneumonia vaccines  Completed  *Topic was postponed. The date shown is not the original due date.   Check on the price of a Tdap at your next visit.

## 2017-01-20 NOTE — Progress Notes (Signed)
Subjective:   [name] is a 83 y.o. male who presents for a subsequent Medicare Annual Wellness Visit. [name] is married and lives in a one story home with his wife. They have 2 adult children and no grandchildren. 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   Review of Systems  Reports that his health is a little better than last year.   Cardiac Risk Factors include: advanced age (>13men, >46 women);hypertension;dyslipidemia;male gender  Musculoskeletal: chronic right knee pain   Objective:    Today's Vitals   01/20/17 0839 01/20/17 0856  BP: (!) 176/74   Pulse: 74   Weight: 241 lb (109.3 kg)   Height: 5\' 11"  (1.803 m)   PainSc:  7    Body mass index is 33.61 kg/m.  Advanced Directives 01/20/2017 12/14/2016 10/05/2016 04/04/2016 04/03/2016 01/16/2015 01/14/2014  Does Patient Have a Medical Advance Directive? Yes No No No No Yes Yes  Type of Paramedic of [place];Living will - - - - Press photographer;Living will [place];Living will  Does patient want to make changes to medical advance directive? No - Patient declined - - - - No - Patient declined -  Copy of [place] in Chart? No - copy requested - - - - No - copy requested No - copy requested  Would patient like information on creating a medical advance directive? - - - No - Patient declined - - -    Current Medications (verified) Outpatient Encounter Medications as of 01/20/2017  Medication Sig  . amLODipine (NORVASC) 10 MG tablet Take 1 tablet (10 mg total) by mouth daily.  Marland Kitchen aspirin 325 MG tablet Take 1 tablet (325 mg total) by mouth daily.  Marland Kitchen atorvastatin (LIPITOR) 20 MG tablet Take 1 tablet (20 mg total) by mouth daily.  . Calcium Carb-Cholecalciferol (CALCIUM 600 + D PO) Take 1 tablet by mouth daily after lunch.   . Cholecalciferol (VITAMIN D3) 5000 UNITS CAPS Take 1 capsule by mouth  daily.  . Coenzyme Q10 (CO Q 10) 60 MG CAPS Take 60 mg by mouth.  . Elastic Bandages & Supports (V-2 HIGH COMPRESSION HOSE) MISC Wear daily when up walking around  . FLAXSEED, LINSEED, PO Take 30 mLs by mouth every morning. GRINDS FLAX SEEDS AND PUTS IN 1/4 CUP OF WATER  . Leuprolide Acetate, 6 Month, (LUPRON DEPOT) 45 MG injection Inject 45 mg into the muscle every 6 (six) months.   . levothyroxine (SYNTHROID, LEVOTHROID) 88 MCG tablet Take 1 Tablet by mouth once daily BEFORE BREAKFAST  . Magnesium 300 MG CAPS Take 1 capsule by mouth daily.  . niacin 250 MG tablet Take 250 mg by mouth at bedtime.  . Probiotic Product (PROBIOTIC DAILY PO) Take 1 capsule by mouth daily.   No facility-administered encounter medications on file as of 01/20/2017.     Allergies (verified) Caffeine and Codeine   History: Past Medical History:  Diagnosis Date  . Arthritis   . BNC (bladder neck contracture)   . BPPV (benign paroxysmal positional vertigo)   . Cataract    right  . Diverticulosis   . H/O diarrhea SECONDARY TO RADIATION THERAPY --  INTERMITANT  DIARRHEA  . Hematuria   . History of hemorrhagic cystitis    SECONDARY RADIATION THERAPY 1997  . History of prostate cancer CURRENTLY  ELEVATED PSA--  LUPRON INJECTIONS   S/P PROSTATECTOMY AND RADIATION THERAPY  1997  . Hyperlipidemia   . Hypertension   . Hypothyroidism   . Radiation cystitis   . Radiation proctitis   . Urinary incontinence   . Urinary retention   . Vitamin D deficiency    Past Surgical History:  Procedure Laterality Date  . CATARACT EXTRACTION W/ INTRAOCULAR LENS IMPLANT Right   . COLONOSCOPY W/ POLYPECTOMY  2005; 08/11/2010   2005: 1 cm hyperplastic sigmoid polyp, Dr. Wynetta Emery 2012: hyperplastic splenic flexure polyp -1 cm, diverticulosis, radiation proctitis, anal stenosis  . CYSTO/ FULGERATION OF BLEEDERS/ RESECTION BLADDER NECK CONTRACTURE  07-06-2004   BNC AND RADIATION CYSTITIS  . EYE SURGERY    . KNEE ARTHROSCOPY  Right   . LUMBAR DISC SURGERY  1996   L4 -- L5  . PROSTATECTOMY  1997  . SPINE SURGERY    . TRANSURETHRAL RESECTION OF BLADDER NECK N/A 06/23/2012   Procedure: TRANSURETHRAL RESECTION OF BLADDER NECK CONTRACTURE WITH GYRUS;  Surgeon: Claybon Jabs, MD;  Location: [facility];  Service: Urology;  Laterality: N/A;  . TRANSURETHRAL RESECTION OF BLADDER TUMOR WITH GYRUS (TURBT-GYRUS) N/A 06/01/2013   Procedure: TRANSURETHRAL RESECTION OF BLADDER NECK CONTRACTURE WITH GYRUS (TURBT-GYRUS) AND INJECTION OF KENALOG;  Surgeon: Claybon Jabs, MD;  Location: [facility];  Service: Urology;  Laterality: N/A;   Family History  Problem Relation Age of Onset  . Prostate cancer Brother   . Kidney disease Brother   . Congestive Heart Failure Mother   . CVA Mother 43  . Pancreatitis Father   . Pneumonia Father   . Cancer Sister        breast  . Obesity Sister   . Early death Sister        appendicitis  . Appendicitis Sister 20       rupture  . Cancer Sister        breast  . Alzheimer's disease Brother   . Hip fracture Brother    Social History   Socioeconomic History  . Marital status: Married    Spouse name: Not on file  . Number of children: 2  . Years of education: Not on file  . Highest education level: Some college, no degree  Social Needs  . Financial resource strain: Not hard at all  . Food insecurity - worry: Never true  . Food insecurity - inability: Never true  . Transportation needs - medical: No  . Transportation needs - non-medical: No  Occupational History  . Occupation: Retired    Comment: Administrator, arts for [employer]  . Smoking status: Former Smoker    Years: 20.00    Types: Cigarettes    Last attempt to quit: 1965    Years since quitting: 54.0  . Smokeless tobacco: Former Systems developer    Types: Chew    Quit date: 1965  . Tobacco comment: SMOKED AND CHEW TOBACCO FOR 20 YRS QUIT AGE 74  Substance and Sexual Activity  .  Alcohol use: No  . Drug use: No  . Sexual activity: No  Other Topics Concern  . Not on file  Social History Narrative  . Not on file   Tobacco Counseling No current use Counseling given: Not Answered Comment: SMOKED AND CHEW TOBACCO FOR 20 YRS QUIT AGE 42   Clinical Intake:     Pain : 0-10 Pain Score: 7  Pain Location: Knee Pain Orientation: Right Pain Descriptors / Indicators: Aching Pain Onset: More than a month ago Pain Frequency: Constant  Pain Relieving Factors: cortisone shots but they only last a week or two. Low impact exercise helps some  Pain Relieving Factors: cortisone shots but they only last a week or two. Low impact exercise helps some  Nutritional Status: BMI of 19-24  Normal Nutritional Risks: None Diabetes: No  How often do you need to have someone help you when you read instructions, pamphlets, or other written materials from your doctor or pharmacy?: 1 - Never What is the last grade level you completed in school?: some college  Interpreter Needed?: No     Activities of Daily Living In your present state of health, do you have any difficulty performing the following activities: 01/20/2017 04/04/2016  Hearing? N N  Vision? N N  Comment Last eye exam was 3 weeks ago with Dr Radford Pax in [place] -  Difficulty concentrating or making decisions? N N  Walking or climbing stairs? Y N  Comment due to right knee pain. Uses handrails. Has steps into house but house is one level -  Dressing or bathing? N N  Doing errands, shopping? N N  Preparing Food and eating ? N -  Using the Toilet? N -  In the past six months, have you accidently leaked urine? Y -  Comment s/p prostate surgeries -  Do you have problems with loss of bowel control? N -  Managing your Medications? N -  Managing your Finances? N -  Housekeeping or managing your Housekeeping? N -  Some recent data might be hidden     Immunizations and Health Maintenance Immunization History  Administered  Date(s) Administered  . Hepatitis B 07/22/1993, 08/26/1993, 04/28/1994  . Influenza, High Dose Seasonal PF 11/10/2015, 11/25/2016  . Influenza,inj,Quad PF,6+ Mos 11/07/2013  . Influenza-Unspecified 11/25/2012, 12/09/2014  . Pneumococcal Conjugate-13 01/14/2014  . Pneumococcal Polysaccharide-23 07/04/2012  . Zoster 01/05/2013   There are no preventive care reminders to display for this patient.  Patient Care Team: Timmothy Euler, MD as PCP - General (Family Medicine) Kathie Rhodes, MD as Consulting Physician (Urology) Sandford Craze, MD as Consulting Physician (Dermatology) Hayden Pedro, MD as Consulting Physician (Ophthalmology)  ED to hospital admission in March 2018 for TIA and in September for lower extremity weakness.     Assessment:   This is a routine wellness examination for [place].  Hearing/Vision screen No deficits noted during visit. Patient is up to date on his eye exam.   Dietary issues and exercise activities discussed: Current Exercise Habits: Structured exercise class, Type of exercise: strength training/weights;stretching;treadmill, Time (Minutes): 60, Frequency (Times/Week): 4, Weekly Exercise (Minutes/Week): 240, Intensity: Moderate, Exercise limited by: orthopedic condition(s)(right knee pain)  Goals    . DIET - EAT MORE FRUITS AND VEGETABLES    . DIET - INCREASE WATER INTAKE     Increase water intake during the Holmes but limit fluids after 6 pm      Depression Screen PHQ 2/9 Scores 01/20/2017 10/15/2016 09/30/2016 09/13/2016  PHQ - 2 Score 0 0 0 0    Fall Risk Fall Risk  01/20/2017 10/15/2016 09/30/2016 09/13/2016 04/08/2016  Falls in the past year? No No No No No  Comment uses a cane - - - -  Number falls in past yr: - - - - -  Injury with Fall? - - - - -  Risk for fall due to : History of fall(s) - - - -  Follow up - - - - -    Is the patient's home free of loose throw rugs  in walkways, pet beds, electrical cords, etc?   yes      Grab bars in  the bathroom? no      Handrails on the stairs?   yes      Adequate lighting?   yes   Cognitive Function: MMSE - Mini Mental State Exam 01/20/2017 01/15/2016 01/16/2015  Orientation to time 5 5 5   Orientation to Place 5 5 5   Registration 3 3 3   Attention/ Calculation 5 3 3   Recall 2 3 3   Language- name 2 objects 2 2 2   Language- repeat 1 1 1   Language- follow 3 step command 3 3 3   Language- read & follow direction 1 1 1   Write a sentence 1 1 1   Copy design 1 1 0  Total score 29 28 27         Screening Tests Health Maintenance  Topic Date Due  . TETANUS/TDAP  01/20/2018 (Originally 06/28/1951)  . DEXA SCAN  01/14/2018  . INFLUENZA VACCINE  Completed  . PNA vac Low Risk Adult  Completed    Qualifies for Shingles Vaccine? Yes. Deferred due to cost Deferred Tdap due to cost  Cancer Screenings: Lung: Low Dose CT Chest recommended if Age 21-80 years, 30 pack-year currently smoking OR have quit w/in 15years. Patient does not qualify. Colorectal: Not recommended     Plan:   increase water intake but limit intake after 6 pm.  Increase fruits and vegetable intake to help with constipation.  Continue to stay active for at least 150 min a week  Move slowly to avoid falls. Continue to use cane.  Keep f/u with PCP Check on the price of a Tdap at your next visit  I have personally reviewed and noted the following in the patient's chart:   . Medical and social history . Use of alcohol, tobacco or illicit drugs  . Current medications and supplements . Functional ability and status . Nutritional status . Physical activity . Advanced directives . List of other physicians . Hospitalizations, surgeries, and ER visits in previous 12 months . Vitals . Screenings to include cognitive, depression, and falls . Referrals and appointments  In addition, I have reviewed and discussed with patient certain preventive protocols, quality metrics, and best practice recommendations. A written  personalized care plan for preventive services as well as general preventive health recommendations were provided to patient.     Chong Sicilian, RN   01/20/2017    I have reviewed and agree with the above AWV documentation.   Evelina Dun, FNP

## 2017-01-21 ENCOUNTER — Other Ambulatory Visit: Payer: Self-pay | Admitting: *Deleted

## 2017-01-22 NOTE — Telephone Encounter (Signed)
Spoke with patient and corrected the dosage amount of niacin on his chart.

## 2017-03-01 DIAGNOSIS — M25561 Pain in right knee: Secondary | ICD-10-CM | POA: Insufficient documentation

## 2017-03-01 DIAGNOSIS — M1711 Unilateral primary osteoarthritis, right knee: Secondary | ICD-10-CM | POA: Diagnosis not present

## 2017-03-31 ENCOUNTER — Encounter: Payer: Self-pay | Admitting: Family Medicine

## 2017-03-31 ENCOUNTER — Ambulatory Visit (INDEPENDENT_AMBULATORY_CARE_PROVIDER_SITE_OTHER): Payer: Medicare Other | Admitting: Family Medicine

## 2017-03-31 VITALS — BP 155/72 | HR 68 | Temp 96.2°F | Ht 71.0 in | Wt 238.4 lb

## 2017-03-31 DIAGNOSIS — I10 Essential (primary) hypertension: Secondary | ICD-10-CM

## 2017-03-31 DIAGNOSIS — E782 Mixed hyperlipidemia: Secondary | ICD-10-CM | POA: Diagnosis not present

## 2017-03-31 DIAGNOSIS — E039 Hypothyroidism, unspecified: Secondary | ICD-10-CM

## 2017-03-31 NOTE — Progress Notes (Signed)
   HPI  Patient presents today here for follow-up.  Hyperlipidemia Good medication compliance. Good tolerance.  Hypertension Typical blood pressure readings at home are 130s over 70s. No headache or chest pain. He does have history of lacunar infarct and TIAs.  Hypothyroidism Asymptomatic Good medication compliance  PMH: Smoking status noted ROS: Per HPI  Objective: BP (!) 155/72   Pulse 68   Temp (!) 96.2 F (35.7 C) (Oral)   Ht 5' 11" (1.803 m)   Wt 238 lb 6.4 oz (108.1 kg)   BMI 33.25 kg/m  Gen: NAD, alert, cooperative with exam HEENT: NCAT CV: RRR, good S1/S2, no murmur Resp: CTABL, no wheezes, non-labored Ext: No edema, warm Neuro: Alert and oriented, No gross deficits  Assessment and plan:  #Hyperlipidemia Labs Continue Lipitor LDL goal less than 70  #Hypertension Elevated here, however controlled at home [place] hypertension Continue amlodipine  #Hypothyroidism Repeat TSH Asymptomatic No changes   Orders Placed This Encounter  Procedures  . Lipid panel  . TSH  . CMP14+EGFR     Sam Bradshaw, MD [facility] 03/31/2017, 8:26 AM     

## 2017-03-31 NOTE — Patient Instructions (Signed)
Great to see you!  Come back to see Dr. Warrick Parisian in 6 months

## 2017-04-01 LAB — CMP14+EGFR
ALT: 15 IU/L (ref 0–44)
AST: 18 IU/L (ref 0–40)
Albumin/Globulin Ratio: 1.7 (ref 1.2–2.2)
Albumin: 4.3 g/dL (ref 3.5–4.7)
Alkaline Phosphatase: 64 IU/L (ref 39–117)
BUN/Creatinine Ratio: 17 (ref 10–24)
BUN: 15 mg/dL (ref 8–27)
Bilirubin Total: 0.5 mg/dL (ref 0.0–1.2)
CALCIUM: 9.8 mg/dL (ref 8.6–10.2)
CO2: 23 mmol/L (ref 20–29)
CREATININE: 0.9 mg/dL (ref 0.76–1.27)
Chloride: 104 mmol/L (ref 96–106)
GFR, EST AFRICAN AMERICAN: 92 mL/min/{1.73_m2} (ref >59)
GFR, EST NON AFRICAN AMERICAN: 79 mL/min/{1.73_m2} (ref >59)
GLUCOSE: 93 mg/dL (ref 65–99)
Globulin, Total: 2.5 g/dL (ref 1.5–4.5)
Potassium: 5.2 mmol/L (ref 3.5–5.2)
Sodium: 140 mmol/L (ref 134–144)
TOTAL PROTEIN: 6.8 g/dL (ref 6.0–8.5)

## 2017-04-01 LAB — LIPID PANEL
CHOL/HDL RATIO: 2 ratio (ref 0.0–5.0)
Cholesterol, Total: 121 mg/dL (ref 100–199)
HDL: 61 mg/dL (ref >39)
LDL Calculated: 40 mg/dL (ref 0–99)
Triglycerides: 98 mg/dL (ref 0–149)
VLDL Cholesterol Cal: 20 mg/dL (ref 5–40)

## 2017-04-01 LAB — TSH: TSH: 2.43 u[IU]/mL (ref 0.450–4.500)

## 2017-04-12 DIAGNOSIS — M79672 Pain in left foot: Secondary | ICD-10-CM | POA: Diagnosis not present

## 2017-04-12 DIAGNOSIS — D2372 Other benign neoplasm of skin of left lower limb, including hip: Secondary | ICD-10-CM | POA: Diagnosis not present

## 2017-04-12 DIAGNOSIS — M79671 Pain in right foot: Secondary | ICD-10-CM | POA: Diagnosis not present

## 2017-04-12 DIAGNOSIS — D2371 Other benign neoplasm of skin of right lower limb, including hip: Secondary | ICD-10-CM | POA: Diagnosis not present

## 2017-04-25 ENCOUNTER — Other Ambulatory Visit: Payer: Self-pay | Admitting: Nurse Practitioner

## 2017-05-11 ENCOUNTER — Other Ambulatory Visit: Payer: Self-pay | Admitting: Nurse Practitioner

## 2017-05-26 DIAGNOSIS — M25561 Pain in right knee: Secondary | ICD-10-CM | POA: Diagnosis not present

## 2017-06-02 DIAGNOSIS — M25561 Pain in right knee: Secondary | ICD-10-CM | POA: Diagnosis not present

## 2017-06-14 DIAGNOSIS — S83281A Other tear of lateral meniscus, current injury, right knee, initial encounter: Secondary | ICD-10-CM | POA: Diagnosis not present

## 2017-06-14 DIAGNOSIS — M170 Bilateral primary osteoarthritis of knee: Secondary | ICD-10-CM | POA: Diagnosis not present

## 2017-06-14 DIAGNOSIS — S83241A Other tear of medial meniscus, current injury, right knee, initial encounter: Secondary | ICD-10-CM | POA: Diagnosis not present

## 2017-06-27 ENCOUNTER — Other Ambulatory Visit: Payer: Self-pay | Admitting: Nurse Practitioner

## 2017-06-27 DIAGNOSIS — I10 Essential (primary) hypertension: Secondary | ICD-10-CM

## 2017-06-28 DIAGNOSIS — C61 Malignant neoplasm of prostate: Secondary | ICD-10-CM | POA: Diagnosis not present

## 2017-07-05 DIAGNOSIS — M1711 Unilateral primary osteoarthritis, right knee: Secondary | ICD-10-CM | POA: Diagnosis not present

## 2017-07-05 DIAGNOSIS — C61 Malignant neoplasm of prostate: Secondary | ICD-10-CM | POA: Diagnosis not present

## 2017-07-12 DIAGNOSIS — D2372 Other benign neoplasm of skin of left lower limb, including hip: Secondary | ICD-10-CM | POA: Diagnosis not present

## 2017-07-12 DIAGNOSIS — M79672 Pain in left foot: Secondary | ICD-10-CM | POA: Diagnosis not present

## 2017-07-12 DIAGNOSIS — D2371 Other benign neoplasm of skin of right lower limb, including hip: Secondary | ICD-10-CM | POA: Diagnosis not present

## 2017-07-12 DIAGNOSIS — M1711 Unilateral primary osteoarthritis, right knee: Secondary | ICD-10-CM | POA: Diagnosis not present

## 2017-07-12 DIAGNOSIS — M79671 Pain in right foot: Secondary | ICD-10-CM | POA: Diagnosis not present

## 2017-07-19 DIAGNOSIS — M1711 Unilateral primary osteoarthritis, right knee: Secondary | ICD-10-CM | POA: Diagnosis not present

## 2017-09-26 ENCOUNTER — Other Ambulatory Visit: Payer: Self-pay | Admitting: Nurse Practitioner

## 2017-09-26 DIAGNOSIS — I10 Essential (primary) hypertension: Secondary | ICD-10-CM

## 2017-10-03 ENCOUNTER — Encounter: Payer: Self-pay | Admitting: Family Medicine

## 2017-10-03 ENCOUNTER — Ambulatory Visit (INDEPENDENT_AMBULATORY_CARE_PROVIDER_SITE_OTHER): Payer: Medicare Other | Admitting: Family Medicine

## 2017-10-03 VITALS — BP 170/77 | HR 66 | Temp 97.0°F | Ht 71.0 in | Wt 242.8 lb

## 2017-10-03 DIAGNOSIS — E669 Obesity, unspecified: Secondary | ICD-10-CM

## 2017-10-03 DIAGNOSIS — I10 Essential (primary) hypertension: Secondary | ICD-10-CM | POA: Diagnosis not present

## 2017-10-03 DIAGNOSIS — E039 Hypothyroidism, unspecified: Secondary | ICD-10-CM

## 2017-10-03 DIAGNOSIS — E782 Mixed hyperlipidemia: Secondary | ICD-10-CM

## 2017-10-03 MED ORDER — ATORVASTATIN CALCIUM 20 MG PO TABS: ORAL_TABLET | ORAL | 3 refills | Status: DC

## 2017-10-03 NOTE — Progress Notes (Signed)
BP (!) 170/77   Pulse 66   Temp (!) 97 F (36.1 C) (Oral)   Ht '5\' 11"'  (1.803 m)   Wt 242 lb 12.8 oz (110.1 kg)   BMI 33.86 kg/m    Subjective:    Patient ID: [name], male    DOB: [birth year], 84 y.o.   MRN: [id number]  HPI: [name] is a 84 y.o. male presenting on 10/03/2017 for Hypertension (6 month follow up) and Establish Care Wendi Snipes pt)   HPI Hypertension Patient is currently on amlodipine, and their blood pressure today is 170/77. Patient denies any lightheadedness or dizziness. Patient denies headaches, blurred vision, chest pains, shortness of breath, or weakness. Denies any side effects from medication and is content with current medication.  Patient will keep logs for next time  Hypothyroidism recheck Patient is coming in for thyroid recheck today as well. They deny any issues with hair changes or heat or cold problems or diarrhea or constipation. They deny any chest pain or palpitations. They are currently on levothyroxine 59mcrograms   Hyperlipidemia Patient is coming in for recheck of his hyperlipidemia. The patient is currently taking atorvastatin. They deny any issues with myalgias or history of liver damage from it. They deny any focal numbness or weakness or chest pain.   Relevant past medical, surgical, family and social history reviewed and updated as indicated. Interim medical history since our last visit reviewed. Allergies and medications reviewed and updated.  Review of Systems  Constitutional: Negative for chills and fever.  Respiratory: Negative for shortness of breath and wheezing.   Cardiovascular: Negative for chest pain and leg swelling.  Musculoskeletal: Negative for back pain and gait problem.  Skin: Negative for rash.  Neurological: Positive for dizziness (Sometimes he gets dizziness when he stands up but he describes it more as this bending and off-balance sensation, he has hesitant towards this increasing blood pressure medication  because of this) and weakness (Patient has some right-sided lower extremity weakness and attributes this to all of the falls and traumas that he had on that leg). Negative for light-headedness and numbness.  All other systems reviewed and are negative.   Per HPI unless specifically indicated above   Allergies as of 10/03/2017      Reactions   Caffeine Palpitations   Codeine Palpitations      Medication List        Accurate as of 10/03/17  8:51 AM. Always use your most recent med list.          amLODipine 10 MG tablet Commonly known as:  NORVASC TAKE 1 TABLET BY MOUTH ONCE DAILY   aspirin 325 MG tablet Take 1 tablet (325 mg total) by mouth daily.   atorvastatin 20 MG tablet Commonly known as:  LIPITOR TAKE 1 TABLET BY MOUTH ONCE DAILY 6 IN THE EVENING   CALCIUM 600 + D PO Take 1 tablet by mouth daily after lunch.   Co Q 10 60 MG Caps Take 60 mg by mouth.   FLAXSEED (LINSEED) PO Take 30 mLs by mouth every morning. GRINDS FLAX SEEDS AND PUTS IN 1/4 CUP OF WATER   levothyroxine 88 MCG tablet Commonly known as:  SYNTHROID, LEVOTHROID TAKE 1 TABLET BY MOUTH ONCE DAILY BEFORE BREAKFAST   LUPRON DEPOT (22-MONTH) 45 MG injection Generic drug:  Leuprolide Acetate (6 Month) Inject 45 mg into the muscle every 6 (six) months.   Magnesium 300 MG Caps Take 1 capsule by mouth daily.   NIACIN  PO Take 25 mg by mouth at bedtime.   PROBIOTIC DAILY PO Take 1 capsule by mouth daily.   V-2 HIGH COMPRESSION HOSE Misc Wear daily when up walking around   Vitamin D3 5000 units Caps Take 1 capsule by mouth daily.          Objective:    BP (!) 170/77   Pulse 66   Temp (!) 97 F (36.1 C) (Oral)   Ht '5\' 11"'  (1.803 m)   Wt 242 lb 12.8 oz (110.1 kg)   BMI 33.86 kg/m   Wt Readings from Last 3 Encounters:  10/03/17 242 lb 12.8 oz (110.1 kg)  03/31/17 238 lb 6.4 oz (108.1 kg)  01/20/17 241 lb (109.3 kg)    Physical Exam  Constitutional: He is oriented to person, place, and  time. He appears well-developed and well-nourished. No distress.  Eyes: Conjunctivae are normal. No scleral icterus.  Neck: Neck supple. No thyromegaly present.  Cardiovascular: Normal rate, regular rhythm, normal heart sounds and intact distal pulses.  No murmur heard. Pulmonary/Chest: Effort normal and breath sounds normal. No respiratory distress. He has no wheezes.  Musculoskeletal: Normal range of motion. He exhibits no edema.  Lymphadenopathy:    He has no cervical adenopathy.  Neurological: He is alert and oriented to person, place, and time. Coordination (Right sided limping gait, patient uses a cane) abnormal.  Skin: Skin is warm and dry. No rash noted. He is not diaphoretic.  Psychiatric: He has a normal mood and affect. His behavior is normal.  Nursing note and vitals reviewed.       Assessment & Plan:   Problem List Items Addressed This Visit      Cardiovascular and Mediastinum   Hypertension - Primary   Relevant Medications   atorvastatin (LIPITOR) 20 MG tablet   Other Relevant Orders   CMP14+EGFR     Endocrine   Hypothyroidism   Relevant Orders   CBC with Differential/Platelet   TSH     Other   Hyperlipidemia   Relevant Medications   atorvastatin (LIPITOR) 20 MG tablet   Other Relevant Orders   Lipid panel   Obesity (BMI 30.0-34.9)      Patient is resistant towards increasing blood pressure medications because of dizziness and spinning sensation and off-balance sensation.  He denies any feelings of presyncope or feelings of lightheadedness.  He thinks this is from the Norvasc but it sounds more like this could be due to his previous strokes.  He says he just finished a round of physical therapy to try and help with this earlier in the year and does not want to go back right now.  He will keep blood pressure logs at home and bring them back on his next visit.  He says his blood pressure at home today was 130s over 70s.  Follow up plan: Return in about 6  months (around 04/03/2018), or if symptoms worsen or fail to improve, for Hypertension and thyroid and cholesterol recheck.  Counseling provided for all of the vaccine components Orders Placed This Encounter  Procedures  . CMP14+EGFR  . CBC with Differential/Platelet  . Lipid panel  . TSH    Caryl Pina, MD [facility] 10/03/2017, 8:51 AM

## 2017-10-04 LAB — CMP14+EGFR
ALBUMIN: 4.1 g/dL (ref 3.5–4.7)
ALK PHOS: 58 IU/L (ref 39–117)
ALT: 15 IU/L (ref 0–44)
AST: 14 IU/L (ref 0–40)
Albumin/Globulin Ratio: 1.6 (ref 1.2–2.2)
BUN / CREAT RATIO: 16 (ref 10–24)
BUN: 14 mg/dL (ref 8–27)
Bilirubin Total: 0.5 mg/dL (ref 0.0–1.2)
CO2: 23 mmol/L (ref 20–29)
CREATININE: 0.86 mg/dL (ref 0.76–1.27)
Calcium: 9.6 mg/dL (ref 8.6–10.2)
Chloride: 106 mmol/L (ref 96–106)
GFR calc Af Amer: 93 mL/min/{1.73_m2} (ref >59)
GFR calc non Af Amer: 80 mL/min/{1.73_m2} (ref >59)
GLUCOSE: 89 mg/dL (ref 65–99)
Globulin, Total: 2.6 g/dL (ref 1.5–4.5)
Potassium: 4.8 mmol/L (ref 3.5–5.2)
Sodium: 145 mmol/L — ABNORMAL HIGH (ref 134–144)
TOTAL PROTEIN: 6.7 g/dL (ref 6.0–8.5)

## 2017-10-04 LAB — CBC WITH DIFFERENTIAL/PLATELET
Basophils Absolute: 0 10*3/uL (ref 0.0–0.2)
Basos: 1 %
EOS (ABSOLUTE): 0.4 10*3/uL (ref 0.0–0.4)
EOS: 6 %
HEMATOCRIT: 43 % (ref 37.5–51.0)
HEMOGLOBIN: 14.8 g/dL (ref 13.0–17.7)
IMMATURE GRANS (ABS): 0 10*3/uL (ref 0.0–0.1)
IMMATURE GRANULOCYTES: 0 %
LYMPHS ABS: 1.9 10*3/uL (ref 0.7–3.1)
LYMPHS: 32 %
MCH: 31.6 pg (ref 26.6–33.0)
MCHC: 34.4 g/dL (ref 31.5–35.7)
MCV: 92 fL (ref 79–97)
MONOCYTES: 10 %
Monocytes Absolute: 0.6 10*3/uL (ref 0.1–0.9)
Neutrophils Absolute: 3 10*3/uL (ref 1.4–7.0)
Neutrophils: 51 %
Platelets: 224 10*3/uL (ref 150–450)
RBC: 4.68 x10E6/uL (ref 4.14–5.80)
RDW: 12.4 % (ref 12.3–15.4)
WBC: 6 10*3/uL (ref 3.4–10.8)

## 2017-10-04 LAB — LIPID PANEL
CHOLESTEROL TOTAL: 120 mg/dL (ref 100–199)
Chol/HDL Ratio: 2.3 ratio (ref 0.0–5.0)
HDL: 53 mg/dL (ref >39)
LDL CALC: 38 mg/dL (ref 0–99)
TRIGLYCERIDES: 145 mg/dL (ref 0–149)
VLDL CHOLESTEROL CAL: 29 mg/dL (ref 5–40)

## 2017-10-04 LAB — TSH: TSH: 1.99 u[IU]/mL (ref 0.450–4.500)

## 2017-10-05 DIAGNOSIS — D1801 Hemangioma of skin and subcutaneous tissue: Secondary | ICD-10-CM | POA: Diagnosis not present

## 2017-10-05 DIAGNOSIS — L570 Actinic keratosis: Secondary | ICD-10-CM | POA: Diagnosis not present

## 2017-10-05 DIAGNOSIS — L821 Other seborrheic keratosis: Secondary | ICD-10-CM | POA: Diagnosis not present

## 2017-10-11 DIAGNOSIS — I70203 Unspecified atherosclerosis of native arteries of extremities, bilateral legs: Secondary | ICD-10-CM | POA: Diagnosis not present

## 2017-10-11 DIAGNOSIS — L84 Corns and callosities: Secondary | ICD-10-CM | POA: Diagnosis not present

## 2017-10-28 ENCOUNTER — Other Ambulatory Visit: Payer: Self-pay | Admitting: Family Medicine

## 2017-10-28 DIAGNOSIS — I10 Essential (primary) hypertension: Secondary | ICD-10-CM

## 2017-11-22 ENCOUNTER — Ambulatory Visit (INDEPENDENT_AMBULATORY_CARE_PROVIDER_SITE_OTHER): Payer: Medicare Other

## 2017-11-22 DIAGNOSIS — Z23 Encounter for immunization: Secondary | ICD-10-CM | POA: Diagnosis not present

## 2017-12-26 ENCOUNTER — Encounter: Payer: Self-pay | Admitting: Family Medicine

## 2017-12-26 DIAGNOSIS — C61 Malignant neoplasm of prostate: Secondary | ICD-10-CM | POA: Diagnosis not present

## 2017-12-29 DIAGNOSIS — M1711 Unilateral primary osteoarthritis, right knee: Secondary | ICD-10-CM | POA: Insufficient documentation

## 2018-01-03 DIAGNOSIS — Z5111 Encounter for antineoplastic chemotherapy: Secondary | ICD-10-CM | POA: Diagnosis not present

## 2018-01-03 DIAGNOSIS — C61 Malignant neoplasm of prostate: Secondary | ICD-10-CM | POA: Diagnosis not present

## 2018-01-03 DIAGNOSIS — R9721 Rising PSA following treatment for malignant neoplasm of prostate: Secondary | ICD-10-CM | POA: Diagnosis not present

## 2018-01-10 DIAGNOSIS — I70203 Unspecified atherosclerosis of native arteries of extremities, bilateral legs: Secondary | ICD-10-CM | POA: Diagnosis not present

## 2018-01-10 DIAGNOSIS — M79676 Pain in unspecified toe(s): Secondary | ICD-10-CM | POA: Diagnosis not present

## 2018-01-10 DIAGNOSIS — B351 Tinea unguium: Secondary | ICD-10-CM | POA: Diagnosis not present

## 2018-01-10 DIAGNOSIS — L84 Corns and callosities: Secondary | ICD-10-CM | POA: Diagnosis not present

## 2018-01-23 ENCOUNTER — Ambulatory Visit: Payer: Medicare Other | Admitting: *Deleted

## 2018-01-30 ENCOUNTER — Ambulatory Visit (INDEPENDENT_AMBULATORY_CARE_PROVIDER_SITE_OTHER): Payer: Medicare Other | Admitting: *Deleted

## 2018-01-30 ENCOUNTER — Encounter: Payer: Self-pay | Admitting: *Deleted

## 2018-01-30 VITALS — BP 163/81 | HR 68 | Ht 71.0 in | Wt 245.0 lb

## 2018-01-30 DIAGNOSIS — Z0000 Encounter for general adult medical examination without abnormal findings: Secondary | ICD-10-CM | POA: Diagnosis not present

## 2018-01-30 NOTE — Progress Notes (Signed)
Subjective:   [name] is a 85 y.o. male who presents for Medicare Annual/Subsequent preventive examination.  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  He enjoys walking for exercise, reading - especially the Bible, and watching television.  He lives at home with his wife.  They have 2 adult children.  [name] feels his health has improved over the past year because he has increased his walking for exercise and he feels better.  He reports no surgeries, ER visits, or hospitalizations in the past year.  He does mention chronic right knee pain that is bothering him today.  He states he is scheduled with Dr. Veverly Fells for an injection 02/01/18  Review of Systems:   Musculoskeletal - right knee pain  Cardiac Risk Factors include: dyslipidemia;hypertension;male gender     Objective:    Vitals: BP (!) 163/81   Pulse 68   Ht 5\' 11"  (1.803 m)   Wt 245 lb (111.1 kg)   BMI 34.17 kg/m   Body mass index is 34.17 kg/m.  Advanced Directives 01/30/2018 01/20/2017 12/14/2016 10/05/2016 04/04/2016 04/03/2016 01/16/2015  Does Patient Have a Medical Advance Directive? Yes Yes No No No No Yes  Type of Paramedic of [place];Living will [place];Living will - - - - Press photographer;Living will  Does patient want to make changes to medical advance directive? No - Patient declined No - Patient declined - - - - No - Patient declined  Copy of [place] in Chart? No - copy requested No - copy requested - - - - No - copy requested  Would patient like information on creating a medical advance directive? - - - - No - Patient declined - -    Tobacco Social History   Tobacco Use  Smoking Status Former Smoker  . Years: 20.00  . Types: Cigarettes  . Last attempt to quit: 1965  . Years since quitting: 55.0  Smokeless Tobacco Former Systems developer  . Types: Chew  . Quit  date: 1965  Tobacco Comment   SMOKED AND CHEW TOBACCO FOR 20 YRS QUIT AGE 60     Counseling given: No Comment: SMOKED AND CHEW TOBACCO FOR 20 YRS QUIT AGE 24   Clinical Intake:     Pain Score: 6                  Past Medical History:  Diagnosis Date  . Arthritis   . BNC (bladder neck contracture)   . BPPV (benign paroxysmal positional vertigo)   . Cataract    bilateral  . Diverticulosis   . H/O diarrhea SECONDARY TO RADIATION THERAPY --  INTERMITANT  DIARRHEA  . Hematuria   . History of hemorrhagic cystitis    SECONDARY RADIATION THERAPY 1997  . History of prostate cancer CURRENTLY  ELEVATED PSA--  LUPRON INJECTIONS   S/P PROSTATECTOMY AND RADIATION THERAPY  1997  . Hyperlipidemia   . Hypertension   . Hypothyroidism   . Radiation cystitis   . Radiation proctitis   . Urinary incontinence   . Urinary retention   . Vitamin D deficiency    Past Surgical History:  Procedure Laterality Date  . CATARACT EXTRACTION W/ INTRAOCULAR LENS IMPLANT Right   . COLONOSCOPY W/ POLYPECTOMY  2005; 08/11/2010   2005: 1 cm hyperplastic sigmoid polyp, Dr. Wynetta Emery 2012: hyperplastic splenic flexure polyp -1 cm, diverticulosis, radiation proctitis, anal  stenosis  . CYSTO/ FULGERATION OF BLEEDERS/ RESECTION BLADDER NECK CONTRACTURE  07-06-2004   BNC AND RADIATION CYSTITIS  . EYE SURGERY    . KNEE ARTHROSCOPY Right   . LUMBAR DISC SURGERY  1996   L4 -- L5  . PROSTATECTOMY  1997  . SPINE SURGERY    . TRANSURETHRAL RESECTION OF BLADDER NECK N/A 06/23/2012   Procedure: TRANSURETHRAL RESECTION OF BLADDER NECK CONTRACTURE WITH GYRUS;  Surgeon: Claybon Jabs, MD;  Location: [facility];  Service: Urology;  Laterality: N/A;  . TRANSURETHRAL RESECTION OF BLADDER TUMOR WITH GYRUS (TURBT-GYRUS) N/A 06/01/2013   Procedure: TRANSURETHRAL RESECTION OF BLADDER NECK CONTRACTURE WITH GYRUS (TURBT-GYRUS) AND INJECTION OF KENALOG;  Surgeon: Claybon Jabs, MD;  Location: [facility];  Service: Urology;  Laterality: N/A;   Family History  Problem Relation Age of Onset  . Prostate cancer Brother   . Kidney disease Brother   . Congestive Heart Failure Mother   . CVA Mother 52  . Pancreatitis Father   . Pneumonia Father   . Cancer Sister        breast  . Obesity Sister   . Early death Sister        appendicitis  . Appendicitis Sister 20       rupture  . Cancer Sister        breast  . Kidney disease Sister   . Alzheimer's disease Brother   . Hip fracture Brother    Social History   Socioeconomic History  . Marital status: Married    Spouse name: Not on file  . Number of children: 2  . Years of education: Not on file  . Highest education level: Some college, no degree  Occupational History  . Occupation: Retired    Comment: Administrator, arts for [employer]  . Financial resource strain: Not hard at all  . Food insecurity:    Worry: Never true    Inability: Never true  . Transportation needs:    Medical: No    Non-medical: No  Tobacco Use  . Smoking status: Former Smoker    Years: 20.00    Types: Cigarettes    Last attempt to quit: 1965    Years since quitting: 55.0  . Smokeless tobacco: Former Systems developer    Types: Chew    Quit date: 1965  . Tobacco comment: SMOKED AND CHEW TOBACCO FOR 20 YRS QUIT AGE 36  Substance and Sexual Activity  . Alcohol use: No  . Drug use: No  . Sexual activity: Not on file  Lifestyle  . Physical activity:    Days per week: 3 days    Minutes per session: 60 min  . Stress: Not at all  Relationships  . Social connections:    Talks on phone: More than three times a week    Gets together: More than three times a week    Attends religious service: Never    Active member of club or organization: No    Attends meetings of clubs or organizations: Never    Relationship status: Married  Other Topics Concern  . Not on file  Social History Narrative  . Not on file    Outpatient Encounter  Medications as of 01/30/2018  Medication Sig  . amLODipine (NORVASC) 10 MG tablet TAKE 1 TABLET BY MOUTH ONCE DAILY  . aspirin 325 MG tablet Take 1 tablet (325 mg total) by mouth daily.  Marland Kitchen atorvastatin (LIPITOR) 20 MG  tablet TAKE 1 TABLET BY MOUTH ONCE DAILY 6 IN THE EVENING  . Cholecalciferol (VITAMIN D3) 5000 UNITS CAPS Take 1 capsule by mouth daily.  . Coenzyme Q10 (CO Q 10) 60 MG CAPS Take 60 mg by mouth.  . Elastic Bandages & Supports (V-2 HIGH COMPRESSION HOSE) MISC Wear daily when up walking around  . FLAXSEED, LINSEED, PO Take 30 mLs by mouth every morning. GRINDS FLAX SEEDS AND PUTS IN 1/4 CUP OF WATER  . GARLIC PO Take 2 capsules by mouth daily.  Marland Kitchen Leuprolide Acetate, 6 Month, (LUPRON DEPOT) 45 MG injection Inject 45 mg into the muscle every 6 (six) months.   . levothyroxine (SYNTHROID, LEVOTHROID) 88 MCG tablet TAKE 1 TABLET BY MOUTH ONCE DAILY BEFORE BREAKFAST  . Magnesium 300 MG CAPS Take 1 capsule by mouth daily.  . Probiotic Product (PROBIOTIC DAILY PO) Take 1 capsule by mouth daily.  . Calcium Carb-Cholecalciferol (CALCIUM 600 + D PO) Take 1 tablet by mouth daily after lunch.   Marland Kitchen NIACIN PO Take 25 mg by mouth at bedtime.    No facility-administered encounter medications on file as of 01/30/2018.     Activities of Daily Living In your present state of health, do you have any difficulty performing the following activities: 01/30/2018  Hearing? Y  Comment He is noticing he is having to listen to the television louder, and his wife says he has trouble hearing.  Not interested in audiology referral at this time.  Vision? N  Difficulty concentrating or making decisions? Y  Comment Some trouble concentrating and remembering  Walking or climbing stairs? Y  Comment Uses cane for balance   Dressing or bathing? N  Doing errands, shopping? N  Preparing Food and eating ? N  Using the Toilet? N  In the past six months, have you accidently leaked urine? Y  Comment Wears depends due to  urinary incontenence   Do you have problems with loss of bowel control? N  Managing your Medications? N  Managing your Finances? N  Housekeeping or managing your Housekeeping? N  Some recent data might be hidden    Patient Care Team: Dettinger, Fransisca Kaufmann, MD as PCP - General (Family Medicine) Kathie Rhodes, MD as Consulting Physician (Urology) Sandford Craze, MD as Consulting Physician (Dermatology) Hayden Pedro, MD as Consulting Physician (Ophthalmology) Doren Custard, Robb Matar, PA-C as Physician Assistant (Physician Assistant) Netta Cedars, MD as Consulting Physician (Orthopedic Surgery) Rosalin Hawking, MD as Consulting Physician (Neurology)   Assessment:   This is a routine wellness examination for [name].  Exercise Activities and Dietary recommendations  [name] states he eats 3 meals per day.  Usually cereal or bacon and eggs, or eggs and grits for breakfast, soup or meat and vegetables for lunch, and has a small supper - peanut butter crackers or a sandwich.  He states he and his wife have recently started having much smaller evening meals.  Recommended a diet of mostly vegetables, fruits, lean proteins and whole grains.  Advised having processed meat such as bacon no more than 1-2 times per week.       Current Exercise Habits: Home exercise routine, Type of exercise: walking, Time (Minutes): 30, Frequency (Times/Week): 5, Weekly Exercise (Minutes/Week): 150, Intensity: Moderate, Exercise limited by: neurologic condition(s);orthopedic condition(s)  Goals    . DIET - EAT MORE FRUITS AND VEGETABLES    . DIET - EAT MORE FRUITS AND VEGETABLES (pt-stated)     Have 3-5 servings of fruits and vegetables per day.    Marland Kitchen  DIET - INCREASE WATER INTAKE     Increase water intake during the day but limit fluids after 6 pm       Fall Risk Fall Risk  01/30/2018 10/03/2017 03/31/2017 01/20/2017 10/15/2016  Falls in the past year? 0 No No No No  Comment - - - uses a cane -  Number falls in  past yr: - - - - -  Injury with Fall? - - - - -  Risk for fall due to : - - - History of fall(s) -  Follow up - - - - -   Is the patient's home free of loose throw rugs in walkways, pet beds, electrical cords, etc?   yes      Grab bars in the bathroom? yes      Handrails on the stairs?   yes      Adequate lighting?   yes    Depression Screen PHQ 2/9 Scores 01/30/2018 10/03/2017 03/31/2017 01/20/2017  PHQ - 2 Score 3 0 0 0  PHQ- 9 Score 3 - - -    Cognitive Function MMSE - Mini Mental State Exam 01/30/2018 01/20/2017 01/15/2016 01/16/2015  Orientation to time 5 5 5 5   Orientation to Place 5 5 5 5   Registration 3 3 3 3   Attention/ Calculation 5 5 3 3   Recall 3 2 3 3   Language- name 2 objects 2 2 2 2   Language- repeat 0 1 1 1   Language- follow 3 step command 3 3 3 3   Language- read & follow direction 1 1 1 1   Write a sentence 1 1 1 1   Copy design 1 1 1  0  Total score 29 29 28 27         Immunization History  Administered Date(s) Administered  . Hepatitis B 07/22/1993, 08/26/1993, 04/28/1994  . Influenza, High Dose Seasonal PF 11/10/2015, 11/25/2016, 11/22/2017  . Influenza,inj,Quad PF,6+ Mos 11/07/2013  . Influenza-Unspecified 11/25/2012, 12/09/2014  . Pneumococcal Conjugate-13 01/14/2014  . Pneumococcal Polysaccharide-23 07/04/2012  . Zoster 01/05/2013    Qualifies for Shingles Vaccine? Yes, declined today  Screening Tests Health Maintenance  Topic Date Due  . TETANUS/TDAP  01/31/2019 (Originally 06/28/1951)  . INFLUENZA VACCINE  Completed  . PNA vac Low Risk Adult  Completed  . DEXA SCAN  Discontinued   Cancer Screenings: Lung: Low Dose CT Chest recommended if Age 101-80 years, 30 pack-year currently smoking OR have quit w/in 15years. Patient does not qualify. Colorectal: up to date  Additional Screenings:  Hepatitis C Screening: Not indicated      Plan:     Work on your goal of increasing your vegetable and fruit intake to 3-5 servings per day.  Bring a copy of  your Living Will and [place] to our office to be filed in your medical record.  Continue to move carefully and use cane to avoid falls.    I have personally reviewed and noted the following in the patient's chart:   . Medical and social history . Use of alcohol, tobacco or illicit drugs  . Current medications and supplements . Functional ability and status . Nutritional status . Physical activity . Advanced directives . List of other physicians . Hospitalizations, surgeries, and ER visits in previous 12 months . Vitals . Screenings to include cognitive, depression, and falls . Referrals and appointments  In addition, I have reviewed and discussed with patient certain preventive protocols, quality metrics, and best practice recommendations. A written personalized care plan for preventive  services as well as general preventive health recommendations were provided to patient.     Denyce Robert, RN  01/30/2018

## 2018-01-30 NOTE — Patient Instructions (Signed)
Please work on your goal of increasing your vegetable and fruit intake to 3-5 servings per day.   At your convenience, please bring a copy of your Living Will and [place] to our office to be filed in your medical record.   Please continue to move carefully and use cane to avoid falls.   Thank you for coming in for your Annual Wellness Visit today!    Preventive Care 39 Years and Older, Male Preventive care refers to lifestyle choices and visits with your health care provider that can promote health and wellness. What does preventive care include?   A yearly physical exam. This is also called an annual well check.  Dental exams once or twice a year.  Routine eye exams. Ask your health care provider how often you should have your eyes checked.  Personal lifestyle choices, including: ? Daily care of your teeth and gums. ? Regular physical activity. ? Eating a healthy diet. ? Avoiding tobacco and drug use. ? Limiting alcohol use. ? Practicing safe sex. ? Taking low doses of aspirin every day. ? Taking vitamin and mineral supplements as recommended by your health care provider. What happens during an annual well check? The services and screenings done by your health care provider during your annual well check will depend on your age, overall health, lifestyle risk factors, and family history of disease. Counseling Your health care provider may ask you questions about your:  Alcohol use.  Tobacco use.  Drug use.  Emotional well-being.  Home and relationship well-being.  Sexual activity.  Eating habits.  History of falls.  Memory and ability to understand (cognition).  Work and work Statistician. Screening You may have the following tests or measurements:  Height, weight, and BMI.  Blood pressure.  Lipid and cholesterol levels. These may be checked every 5 years, or more frequently if you are over 67 years old.  Skin check.  Lung cancer  screening. You may have this screening every year starting at age 68 if you have a 30-pack-year history of smoking and currently smoke or have quit within the past 15 years.  Colorectal cancer screening. All adults should have this screening starting at age 35 and continuing until age 56. You will have tests every 1-10 years, depending on your results and the type of screening test. People at increased risk should start screening at an earlier age. Screening tests may include: ? Guaiac-based fecal occult blood testing. ? Fecal immunochemical test (FIT). ? Stool DNA test. ? Virtual colonoscopy. ? Sigmoidoscopy. During this test, a flexible tube with a tiny camera (sigmoidoscope) is used to examine your rectum and lower colon. The sigmoidoscope is inserted through your anus into your rectum and lower colon. ? Colonoscopy. During this test, a long, thin, flexible tube with a tiny camera (colonoscope) is used to examine your entire colon and rectum.  Prostate cancer screening. Recommendations will vary depending on your family history and other risks.  Hepatitis C blood test.  Hepatitis B blood test.  Sexually transmitted disease (STD) testing.  Diabetes screening. This is done by checking your blood sugar (glucose) after you have not eaten for a while (fasting). You may have this done every 1-3 years.  Abdominal aortic aneurysm (AAA) screening. You may need this if you are a current or former smoker.  Osteoporosis. You may be screened starting at age 87 if you are at high risk. Talk with your health care provider about your test results, treatment options,  and if necessary, the need for more tests. Vaccines Your health care provider may recommend certain vaccines, such as:  Influenza vaccine. This is recommended every year.  Tetanus, diphtheria, and acellular pertussis (Tdap, Td) vaccine. You may need a Td booster every 10 years.  Varicella vaccine. You may need this if you have not been  vaccinated.  Zoster vaccine. You may need this after age 52.  Measles, mumps, and rubella (MMR) vaccine. You may need at least one dose of MMR if you were born in 1957 or later. You may also need a second dose.  Pneumococcal 13-valent conjugate (PCV13) vaccine. One dose is recommended after age 74.  Pneumococcal polysaccharide (PPSV23) vaccine. One dose is recommended after age 57.  Meningococcal vaccine. You may need this if you have certain conditions.  Hepatitis A vaccine. You may need this if you have certain conditions or if you travel or work in places where you may be exposed to hepatitis A.  Hepatitis B vaccine. You may need this if you have certain conditions or if you travel or work in places where you may be exposed to hepatitis B.  Haemophilus influenzae type b (Hib) vaccine. You may need this if you have certain risk factors. Talk to your health care provider about which screenings and vaccines you need and how often you need them. This information is not intended to replace advice given to you by your health care provider. Make sure you discuss any questions you have with your health care provider. Document Released: 02/07/2015 Document Revised: 03/03/2017 Document Reviewed: 11/12/2014 Elsevier Interactive Patient Education  2019 [employer].

## 2018-02-01 DIAGNOSIS — M1711 Unilateral primary osteoarthritis, right knee: Secondary | ICD-10-CM | POA: Diagnosis not present

## 2018-02-08 DIAGNOSIS — M1711 Unilateral primary osteoarthritis, right knee: Secondary | ICD-10-CM | POA: Diagnosis not present

## 2018-02-15 DIAGNOSIS — M1711 Unilateral primary osteoarthritis, right knee: Secondary | ICD-10-CM | POA: Diagnosis not present

## 2018-03-28 ENCOUNTER — Other Ambulatory Visit: Payer: Self-pay | Admitting: Family Medicine

## 2018-03-28 DIAGNOSIS — I10 Essential (primary) hypertension: Secondary | ICD-10-CM

## 2018-04-05 ENCOUNTER — Encounter: Payer: Self-pay | Admitting: Family Medicine

## 2018-04-05 ENCOUNTER — Ambulatory Visit (INDEPENDENT_AMBULATORY_CARE_PROVIDER_SITE_OTHER): Payer: Medicare Other | Admitting: Family Medicine

## 2018-04-05 ENCOUNTER — Ambulatory Visit: Payer: Medicare Other | Admitting: Family Medicine

## 2018-04-05 ENCOUNTER — Other Ambulatory Visit: Payer: Self-pay

## 2018-04-05 VITALS — BP 161/68 | HR 70 | Temp 97.3°F | Ht 71.0 in | Wt 246.0 lb

## 2018-04-05 DIAGNOSIS — E782 Mixed hyperlipidemia: Secondary | ICD-10-CM

## 2018-04-05 DIAGNOSIS — Z8639 Personal history of other endocrine, nutritional and metabolic disease: Secondary | ICD-10-CM | POA: Diagnosis not present

## 2018-04-05 DIAGNOSIS — I10 Essential (primary) hypertension: Secondary | ICD-10-CM

## 2018-04-05 DIAGNOSIS — E039 Hypothyroidism, unspecified: Secondary | ICD-10-CM

## 2018-04-05 LAB — BAYER DCA HB A1C WAIVED: HB A1C (BAYER DCA - WAIVED): 5.5 % (ref <7.0)

## 2018-04-05 MED ORDER — AMLODIPINE BESYLATE 10 MG PO TABS: 10.0000 mg | ORAL_TABLET | Freq: Every day | ORAL | 5 refills | Status: DC

## 2018-04-05 MED ORDER — LISINOPRIL 10 MG PO TABS: 10.0000 mg | ORAL_TABLET | Freq: Every day | ORAL | 3 refills | Status: DC

## 2018-04-05 MED ORDER — LEVOTHYROXINE SODIUM 88 MCG PO TABS: ORAL_TABLET | ORAL | 3 refills | Status: DC

## 2018-04-05 NOTE — Patient Instructions (Signed)
Use Debrox drops in right ear x 1 week.     Heart-Healthy Eating Plan Heart-healthy meal planning includes:  Eating less unhealthy fats.  Eating more healthy fats.  Making other changes in your diet. Talk with your doctor or a diet specialist (dietitian) to create an eating plan that is right for you. What is my plan? Your doctor may recommend an eating plan that includes:  Total fat: ______% or less of total calories a day.  Saturated fat: ______% or less of total calories a day.  Cholesterol: less than _________mg a day. What are tips for following this plan? Cooking Avoid frying your food. Try to bake, boil, grill, or broil it instead. You can also reduce fat by:  Removing the skin from poultry.  Removing all visible fats from meats.  Steaming vegetables in water or broth. Meal planning   At meals, divide your plate into four equal parts: ? Fill one-half of your plate with vegetables and green salads. ? Fill one-fourth of your plate with whole grains. ? Fill one-fourth of your plate with lean protein foods.  Eat 4-5 servings of vegetables per day. A serving of vegetables is: ? 1 cup of raw or cooked vegetables. ? 2 cups of raw leafy greens.  Eat 4-5 servings of fruit per day. A serving of fruit is: ? 1 medium whole fruit. ?  cup of dried fruit. ?  cup of fresh, frozen, or canned fruit. ?  cup of 100% fruit juice.  Eat more foods that have soluble fiber. These are apples, broccoli, carrots, beans, peas, and barley. Try to get 20-30 g of fiber per day.  Eat 4-5 servings of nuts, legumes, and seeds per week: ? 1 serving of dried beans or legumes equals  cup after being cooked. ? 1 serving of nuts is  cup. ? 1 serving of seeds equals 1 tablespoon. General information  Eat more home-cooked food. Eat less restaurant, buffet, and fast food.  Limit or avoid alcohol.  Limit foods that are high in starch and sugar.  Avoid fried foods.  Lose weight if you  are overweight.  Keep track of how much salt (sodium) you eat. This is important if you have high blood pressure. Ask your doctor to tell you more about this.  Try to add vegetarian meals each week. Fats  Choose healthy fats. These include olive oil and canola oil, flaxseeds, walnuts, almonds, and seeds.  Eat more omega-3 fats. These include salmon, mackerel, sardines, tuna, flaxseed oil, and ground flaxseeds. Try to eat fish at least 2 times each week.  Check food labels. Avoid foods with trans fats or high amounts of saturated fat.  Limit saturated fats. ? These are often found in animal products, such as meats, butter, and cream. ? These are also found in plant foods, such as palm oil, palm kernel oil, and coconut oil.  Avoid foods with partially hydrogenated oils in them. These have trans fats. Examples are stick margarine, some tub margarines, cookies, crackers, and other baked goods. What foods can I eat? Fruits All fresh, canned (in natural juice), or frozen fruits. Vegetables Fresh or frozen vegetables (raw, steamed, roasted, or grilled). Green salads. Grains Most grains. Choose whole wheat and whole grains most of the time. Rice and pasta, including brown rice and pastas made with whole wheat. Meats and other proteins Lean, well-trimmed beef, veal, pork, and lamb. Chicken and [place] without skin. All fish and shellfish. Wild duck, rabbit, pheasant, and venison. Egg whites  or low-cholesterol egg substitutes. Dried beans, peas, lentils, and tofu. Seeds and most nuts. Dairy Low-fat or nonfat cheeses, including ricotta and mozzarella. Skim or 1% milk that is liquid, powdered, or evaporated. Buttermilk that is made with low-fat milk. Nonfat or low-fat yogurt. Fats and oils Non-hydrogenated (trans-free) margarines. Vegetable oils, including soybean, sesame, sunflower, olive, peanut, safflower, corn, canola, and cottonseed. Salad dressings or mayonnaise made with a vegetable oil.  Beverages Mineral water. Coffee and tea. Diet carbonated beverages. Sweets and desserts Sherbet, gelatin, and fruit ice. Small amounts of dark chocolate. Limit all sweets and desserts. Seasonings and condiments All seasonings and condiments. The items listed above may not be a complete list of foods and drinks you can eat. Contact a dietitian for more options. What foods should I avoid? Fruits Canned fruit in heavy syrup. Fruit in cream or butter sauce. Fried fruit. Limit coconut. Vegetables Vegetables cooked in cheese, cream, or butter sauce. Fried vegetables. Grains Breads that are made with saturated or trans fats, oils, or whole milk. Croissants. Sweet rolls. Donuts. High-fat crackers, such as cheese crackers. Meats and other proteins Fatty meats, such as hot dogs, ribs, sausage, bacon, rib-eye roast or steak. High-fat deli meats, such as salami and bologna. Caviar. Domestic duck and goose. Organ meats, such as liver. Dairy Cream, sour cream, cream cheese, and creamed cottage cheese. Whole-milk cheeses. Whole or 2% milk that is liquid, evaporated, or condensed. Whole buttermilk. Cream sauce or high-fat cheese sauce. Yogurt that is made from whole milk. Fats and oils Meat fat, or shortening. Cocoa butter, hydrogenated oils, palm oil, coconut oil, palm kernel oil. Solid fats and shortenings, including bacon fat, salt pork, lard, and butter. Nondairy cream substitutes. Salad dressings with cheese or sour cream. Beverages Regular sodas and juice drinks with added sugar. Sweets and desserts Frosting. Pudding. Cookies. Cakes. Pies. Milk chocolate or white chocolate. Buttered syrups. Full-fat ice cream or ice cream drinks. The items listed above may not be a complete list of foods and drinks to avoid. Contact a dietitian for more information. Summary  Heart-healthy meal planning includes eating less unhealthy fats, eating more healthy fats, and making other changes in your diet.  Eat a  balanced diet. This includes fruits and vegetables, low-fat or nonfat dairy, lean protein, nuts and legumes, whole grains, and heart-healthy oils and fats. This information is not intended to replace advice given to you by your health care provider. Make sure you discuss any questions you have with your health care provider. Document Released: 07/13/2011 Document Revised: 02/18/2017 Document Reviewed: 02/18/2017 Elsevier Interactive Patient Education  2019 [employer].

## 2018-04-05 NOTE — Progress Notes (Signed)
Subjective:  Patient ID: [name], male    DOB: [birth year]  Age: 85 y.o. MRN: [id number]  CC: Medical Management of Chronic Issues   HPI [name] presents for  follow-up of hypertension. Patient has no history of headache chest pain or shortness of breath or recent cough. Patient also denies symptoms of TIA such as focal numbness or weakness. Patient denies side effects from medication. States taking it regularly. Patient presents for follow-up on  thyroid. The patient has a history of hypothyroidism for many years. It has been stable recently. Pt. denies any change in  voice, loss of hair, heat or cold intolerance. Energy level has been adequate to good. Patient denies constipation and diarrhea. No myxedema. Medication is as noted below. Verified that pt is taking it daily on an empty stomach. Well tolerated.  History [name] has a past medical history of Arthritis, BNC (bladder neck contracture), BPPV (benign paroxysmal positional vertigo), Cataract, Diverticulosis, H/O diarrhea (SECONDARY TO RADIATION THERAPY --  INTERMITANT  DIARRHEA), Hematuria, History of hemorrhagic cystitis, History of prostate cancer (CURRENTLY  ELEVATED PSA--  LUPRON INJECTIONS), Hyperlipidemia, Hypertension, Hypothyroidism, Radiation cystitis, Radiation proctitis, Urinary incontinence, Urinary retention, and Vitamin D deficiency.   He has a past surgical history that includes Prostatectomy (1997); Colonoscopy w/ polypectomy (2005; 08/11/2010); CYSTO/ FULGERATION OF BLEEDERS/ RESECTION BLADDER NECK CONTRACTURE (07-06-2004); Cataract extraction w/ intraocular lens implant (Right); Lumbar disc surgery (1996); Transurethral resection of bladder neck (N/A, 06/23/2012); Transurethral resection of bladder tumor with gyrus (turbt-gyrus) (N/A, 06/01/2013); Eye surgery; Spine surgery; and Knee arthroscopy (Right).   His family history includes Alzheimer's disease in his brother; Appendicitis (age of onset: 85) in his  sister; CVA (age of onset: 34) in his mother; Cancer in his sister and sister; Congestive Heart Failure in his mother; Early death in his sister; Hip fracture in his brother; Kidney disease in his brother and sister; Obesity in his sister; Pancreatitis in his father; Pneumonia in his father; Prostate cancer in his brother.He reports that he quit smoking about 55 years ago. His smoking use included cigarettes. He quit after 20.00 years of use. He quit smokeless tobacco use about 55 years ago.  His smokeless tobacco use included chew. He reports that he does not drink alcohol or use drugs.  Current Outpatient Medications on File Prior to Visit  Medication Sig Dispense Refill  . aspirin 325 MG tablet Take 1 tablet (325 mg total) by mouth daily.    Marland Kitchen atorvastatin (LIPITOR) 20 MG tablet TAKE 1 TABLET BY MOUTH ONCE DAILY 6 IN THE EVENING 90 tablet 3  . Cholecalciferol (VITAMIN D3) 5000 UNITS CAPS Take 1 capsule by mouth daily.    . Elastic Bandages & Supports (V-2 HIGH COMPRESSION HOSE) MISC Wear daily when up walking around 1 each 0  . FLAXSEED, LINSEED, PO Take 30 mLs by mouth every morning. GRINDS FLAX SEEDS AND PUTS IN 1/4 CUP OF WATER    . GARLIC PO Take 2 capsules by mouth daily.    Marland Kitchen Leuprolide Acetate, 6 Month, (LUPRON DEPOT) 45 MG injection Inject 45 mg into the muscle every 6 (six) months.     . Magnesium 300 MG CAPS Take 1 capsule by mouth daily.    . Probiotic Product (PROBIOTIC DAILY PO) Take 1 capsule by mouth daily.     No current facility-administered medications on file prior to visit.     ROS Review of Systems  Constitutional: Negative.   HENT: Negative.   Eyes: Negative for visual  disturbance.  Respiratory: Negative for cough and shortness of breath.   Cardiovascular: Negative for chest pain and leg swelling.  Gastrointestinal: Negative for abdominal pain, diarrhea, nausea and vomiting.  Genitourinary: Negative for difficulty urinating.  Musculoskeletal: Negative for  arthralgias and myalgias.  Skin: Negative for rash.  Neurological: Negative for headaches.  Psychiatric/Behavioral: Negative for sleep disturbance.    Objective:  BP (!) 161/68   Pulse 70   Temp (!) 97.3 F (36.3 C) (Oral)   Ht '5\' 11"'  (1.803 m)   Wt 246 lb (111.6 kg)   BMI 34.31 kg/m   BP Readings from Last 3 Encounters:  04/05/18 (!) 161/68  01/30/18 (!) 163/81  10/03/17 (!) 170/77    Wt Readings from Last 3 Encounters:  04/05/18 246 lb (111.6 kg)  01/30/18 245 lb (111.1 kg)  10/03/17 242 lb 12.8 oz (110.1 kg)     Physical Exam Constitutional:      General: He is not in acute distress.    Appearance: He is well-developed.  HENT:     Head: Normocephalic and atraumatic.     Right Ear: External ear normal. There is impacted cerumen.     Left Ear: External ear normal.     Nose: Nose normal.  Eyes:     Conjunctiva/sclera: Conjunctivae normal.     Pupils: Pupils are equal, round, and reactive to light.  Neck:     Musculoskeletal: Normal range of motion and neck supple.  Cardiovascular:     Rate and Rhythm: Normal rate and regular rhythm.     Heart sounds: Normal heart sounds. No murmur.  Pulmonary:     Effort: Pulmonary effort is normal. No respiratory distress.     Breath sounds: Normal breath sounds. No wheezing or rales.  Abdominal:     Palpations: Abdomen is soft.     Tenderness: There is no abdominal tenderness.  Musculoskeletal: Normal range of motion.  Skin:    General: Skin is warm and dry.  Neurological:     Mental Status: He is alert and oriented to person, place, and time.     Deep Tendon Reflexes: Reflexes are normal and symmetric.  Psychiatric:        Behavior: Behavior normal.        Thought Content: Thought content normal.        Judgment: Judgment normal.       Assessment & Plan:   [name] was seen today for medical management of chronic issues.  Diagnoses and all orders for this visit:  Acquired hypothyroidism -     TSH -     T4,  Free  Essential hypertension -     CBC with Differential/Platelet -     CMP14+EGFR -     amLODipine (NORVASC) 10 MG tablet; Take 1 tablet (10 mg total) by mouth daily.  Mixed hyperlipidemia -     Lipid panel -     Bayer DCA Hb A1c Waived  Other orders -     levothyroxine (SYNTHROID, LEVOTHROID) 88 MCG tablet; TAKE 1 TABLET BY MOUTH ONCE DAILY BEFORE BREAKFAST -     lisinopril (PRINIVIL,ZESTRIL) 10 MG tablet; Take 1 tablet (10 mg total) by mouth daily.   Allergies as of 04/05/2018      Reactions   Caffeine Palpitations   Codeine Palpitations      Medication List       Accurate as of April 05, 2018 11:59 PM. Always use your most recent med list.  amLODipine 10 MG tablet Commonly known as:  NORVASC Take 1 tablet (10 mg total) by mouth daily.   aspirin 325 MG tablet Take 1 tablet (325 mg total) by mouth daily.   atorvastatin 20 MG tablet Commonly known as:  LIPITOR TAKE 1 TABLET BY MOUTH ONCE DAILY 6 IN THE EVENING   FLAXSEED (LINSEED) PO Take 30 mLs by mouth every morning. GRINDS FLAX SEEDS AND PUTS IN 1/4 CUP OF WATER   GARLIC PO Take 2 capsules by mouth daily.   levothyroxine 88 MCG tablet Commonly known as:  SYNTHROID, LEVOTHROID TAKE 1 TABLET BY MOUTH ONCE DAILY BEFORE BREAKFAST   lisinopril 10 MG tablet Commonly known as:  PRINIVIL,ZESTRIL Take 1 tablet (10 mg total) by mouth daily.   Lupron Depot (40-Month) 45 MG injection Generic drug:  Leuprolide Acetate (6 Month) Inject 45 mg into the muscle every 6 (six) months.   Magnesium 300 MG Caps Take 1 capsule by mouth daily.   PROBIOTIC DAILY PO Take 1 capsule by mouth daily.   V-2 High Compression Hose Misc Wear daily when up walking around   Vitamin D3 125 MCG (5000 UT) Caps Take 1 capsule by mouth daily.       Meds ordered this encounter  Medications  . amLODipine (NORVASC) 10 MG tablet    Sig: Take 1 tablet (10 mg total) by mouth daily.    Dispense:  30 tablet    Refill:  5  .  levothyroxine (SYNTHROID, LEVOTHROID) 88 MCG tablet    Sig: TAKE 1 TABLET BY MOUTH ONCE DAILY BEFORE BREAKFAST    Dispense:  90 tablet    Refill:  3  . lisinopril (PRINIVIL,ZESTRIL) 10 MG tablet    Sig: Take 1 tablet (10 mg total) by mouth daily.    Dispense:  90 tablet    Refill:  3      Follow-up: Return in about 6 months (around 10/06/2018).  Claretta Fraise, M.D.

## 2018-04-06 LAB — CBC WITH DIFFERENTIAL/PLATELET
BASOS ABS: 0 10*3/uL (ref 0.0–0.2)
Basos: 1 %
EOS (ABSOLUTE): 0.3 10*3/uL (ref 0.0–0.4)
EOS: 6 %
HEMATOCRIT: 41.8 % (ref 37.5–51.0)
Hemoglobin: 13.8 g/dL (ref 13.0–17.7)
Immature Grans (Abs): 0 10*3/uL (ref 0.0–0.1)
Immature Granulocytes: 0 %
Lymphocytes Absolute: 1.9 10*3/uL (ref 0.7–3.1)
Lymphs: 35 %
MCH: 31.2 pg (ref 26.6–33.0)
MCHC: 33 g/dL (ref 31.5–35.7)
MCV: 94 fL (ref 79–97)
MONOS ABS: 0.6 10*3/uL (ref 0.1–0.9)
Monocytes: 11 %
NEUTROS PCT: 47 %
Neutrophils Absolute: 2.6 10*3/uL (ref 1.4–7.0)
PLATELETS: 204 10*3/uL (ref 150–450)
RBC: 4.43 x10E6/uL (ref 4.14–5.80)
RDW: 13 % (ref 11.6–15.4)
WBC: 5.4 10*3/uL (ref 3.4–10.8)

## 2018-04-06 LAB — LIPID PANEL
CHOL/HDL RATIO: 2.1 ratio (ref 0.0–5.0)
CHOLESTEROL TOTAL: 112 mg/dL (ref 100–199)
HDL: 53 mg/dL (ref >39)
LDL Calculated: 34 mg/dL (ref 0–99)
TRIGLYCERIDES: 127 mg/dL (ref 0–149)
VLDL Cholesterol Cal: 25 mg/dL (ref 5–40)

## 2018-04-06 LAB — CMP14+EGFR
ALK PHOS: 65 IU/L (ref 39–117)
ALT: 13 IU/L (ref 0–44)
AST: 17 IU/L (ref 0–40)
Albumin/Globulin Ratio: 1.8 (ref 1.2–2.2)
Albumin: 4.1 g/dL (ref 3.6–4.6)
BUN/Creatinine Ratio: 23 (ref 10–24)
BUN: 20 mg/dL (ref 8–27)
Bilirubin Total: 0.5 mg/dL (ref 0.0–1.2)
CO2: 22 mmol/L (ref 20–29)
CREATININE: 0.87 mg/dL (ref 0.76–1.27)
Calcium: 9.4 mg/dL (ref 8.6–10.2)
Chloride: 104 mmol/L (ref 96–106)
GFR calc Af Amer: 92 mL/min/{1.73_m2} (ref >59)
GFR calc non Af Amer: 80 mL/min/{1.73_m2} (ref >59)
GLUCOSE: 88 mg/dL (ref 65–99)
Globulin, Total: 2.3 g/dL (ref 1.5–4.5)
Potassium: 4.3 mmol/L (ref 3.5–5.2)
Sodium: 140 mmol/L (ref 134–144)
Total Protein: 6.4 g/dL (ref 6.0–8.5)

## 2018-04-06 LAB — TSH: TSH: 2.33 u[IU]/mL (ref 0.450–4.500)

## 2018-04-06 LAB — T4, FREE: Free T4: 1.74 ng/dL (ref 0.82–1.77)

## 2018-04-07 NOTE — Progress Notes (Signed)
Hello Tiquan,  Your lab result is normal.Some minor variations that are not significant are commonly marked abnormal, but do not represent any medical problem for you.  Best regards, Claretta Fraise, M.D.

## 2018-04-11 ENCOUNTER — Encounter: Payer: Self-pay | Admitting: Family Medicine

## 2018-04-11 DIAGNOSIS — L84 Corns and callosities: Secondary | ICD-10-CM | POA: Diagnosis not present

## 2018-04-11 DIAGNOSIS — B351 Tinea unguium: Secondary | ICD-10-CM | POA: Diagnosis not present

## 2018-04-11 DIAGNOSIS — M79676 Pain in unspecified toe(s): Secondary | ICD-10-CM | POA: Diagnosis not present

## 2018-04-11 DIAGNOSIS — I70203 Unspecified atherosclerosis of native arteries of extremities, bilateral legs: Secondary | ICD-10-CM | POA: Diagnosis not present

## 2018-05-12 ENCOUNTER — Encounter: Payer: Self-pay | Admitting: Family Medicine

## 2018-05-12 ENCOUNTER — Ambulatory Visit (INDEPENDENT_AMBULATORY_CARE_PROVIDER_SITE_OTHER): Payer: Medicare Other | Admitting: Family Medicine

## 2018-05-12 ENCOUNTER — Other Ambulatory Visit: Payer: Self-pay

## 2018-05-12 DIAGNOSIS — E782 Mixed hyperlipidemia: Secondary | ICD-10-CM

## 2018-05-12 DIAGNOSIS — I10 Essential (primary) hypertension: Secondary | ICD-10-CM | POA: Diagnosis not present

## 2018-05-12 DIAGNOSIS — E039 Hypothyroidism, unspecified: Secondary | ICD-10-CM

## 2018-05-12 MED ORDER — AMLODIPINE BESYLATE 10 MG PO TABS: 10.0000 mg | ORAL_TABLET | Freq: Every day | ORAL | 3 refills | Status: DC

## 2018-05-12 NOTE — Progress Notes (Signed)
Subjective:    Patient ID: [name], male    DOB: 03/07/1932, 85 y.o.   MRN: [id number]  CC: No chief complaint on file.   HPI: LADARIOUS Holmes is a 85 y.o. male presenting for follow-up of hypertension. Patient has no history of headache chest pain or shortness of breath or recent cough. Patient also denies symptoms of TIA such as numbness weakness lateralizing.  He does have a past history of TIA.  His neurologist has strongly recommended that he use a whole aspirin daily due to this.  He has no deficits with the exception possibly of some lightheadedness or dizziness.  He attributes this however to his blood pressure medications.  Patient checks  blood pressure at home and has not had any elevated readings recently.  Measurements given by phone today are in the 761P and 509T systolic and in the 26Z 12W and a couple into the low 58K diastolic.  He is checking daily.  Patient denies any other side effects from his medication. States taking them regularly. Patient in for follow-up of elevated cholesterol. Doing well without complaints on current medication. Denies side effects of statin including myalgia and arthralgia and nausea.  Blood testing deferred due to coronavirus sheltering.  Currently no chest pain, shortness of breath or other cardiovascular related symptoms noted. Patient presents for follow-up on  thyroid. The patient has a history of hypothyroidism for many years. It has been stable recently. Pt. denies any change in  voice, loss of hair, heat or cold intolerance. Energy level has been adequate to good. Patient denies constipation and diarrhea. No myxedema. Medication is as noted below. Verified that pt is taking it daily on an empty stomach. Well tolerated.    Depression screen [facility] 2/9 04/05/2018 01/30/2018 10/03/2017 03/31/2017 01/20/2017  Decreased Interest 0 3 0 0 0  Down, Depressed, Hopeless 0 0 0 0 0  PHQ - 2 Score 0 3 0 0 0  Altered sleeping - 0 - - -  Tired, decreased  energy - 0 - - -  Change in appetite - 0 - - -  Feeling bad or failure about yourself  - 0 - - -  Trouble concentrating - 0 - - -  Moving slowly or fidgety/restless - 0 - - -  Suicidal thoughts - 0 - - -  PHQ-9 Score - 3 - - -     Relevant past medical, surgical, family and social history reviewed and updated as indicated.  Interim medical history since our last visit reviewed. Allergies and medications reviewed and updated.  ROS:  Review of Systems  Constitutional: Negative.   HENT: Negative.   Eyes: Negative for visual disturbance.  Respiratory: Negative for cough and shortness of breath.   Cardiovascular: Negative for chest pain and leg swelling.  Gastrointestinal: Negative for abdominal pain, diarrhea, nausea and vomiting.  Genitourinary: Negative for difficulty urinating.  Musculoskeletal: Negative for arthralgias and myalgias.  Skin: Negative for rash.  Neurological: Negative for headaches.  Psychiatric/Behavioral: Negative for sleep disturbance.     Social History   Tobacco Use  Smoking Status Former Smoker  . Years: 20.00  . Types: Cigarettes  . Last attempt to quit: 1965  . Years since quitting: 55.3  Smokeless Tobacco Former Systems developer  . Types: Chew  . Quit date: 1965  Tobacco Comment   SMOKED AND CHEW TOBACCO FOR 20 YRS QUIT AGE 45       Objective:     Wt Readings from Last  3 Encounters:  04/05/18 246 lb (111.6 kg)  01/30/18 245 lb (111.1 kg)  10/03/17 242 lb 12.8 oz (110.1 kg)     Exam deferred. Pt. Harboring due to COVID 19. Phone visit performed.   Assessment & Plan:    Diagnoses and all orders for this visit:  Mixed hyperlipidemia  Essential hypertension -     amLODipine (NORVASC) 10 MG tablet; Take 1 tablet (10 mg total) by mouth daily.  Acquired hypothyroidism   Virtual Visit via telephone Note  I discussed the limitations, risks, security and privacy concerns of performing an evaluation and management service by telephone and the  availability of in person appointments. The patient expressed understanding and agreed to proceed. Pt. Is at home. Dr. Livia Snellen is in his office.  Follow Up Instructions:   I discussed the assessment and treatment plan with the patient. The patient was provided an opportunity to ask questions and all were answered. The patient agreed with the plan and demonstrated an understanding of the instructions.   The patient was advised to call back or seek an in-person evaluation if the symptoms worsen or if the condition fails to improve as anticipated.  Visit started: 3:05 Call ended:  3:18 Total minutes including chart review and phone contact time: 18   Follow up plan: Return in about 6 months (around 11/11/2018).  Claretta Fraise, MD [facility] 05/12/2018, 3:23 PM

## 2018-06-29 DIAGNOSIS — C61 Malignant neoplasm of prostate: Secondary | ICD-10-CM | POA: Diagnosis not present

## 2018-07-06 DIAGNOSIS — C61 Malignant neoplasm of prostate: Secondary | ICD-10-CM | POA: Diagnosis not present

## 2018-07-11 DIAGNOSIS — L84 Corns and callosities: Secondary | ICD-10-CM | POA: Diagnosis not present

## 2018-07-11 DIAGNOSIS — I70203 Unspecified atherosclerosis of native arteries of extremities, bilateral legs: Secondary | ICD-10-CM | POA: Diagnosis not present

## 2018-07-11 DIAGNOSIS — B351 Tinea unguium: Secondary | ICD-10-CM | POA: Diagnosis not present

## 2018-07-11 DIAGNOSIS — M79676 Pain in unspecified toe(s): Secondary | ICD-10-CM | POA: Diagnosis not present

## 2018-09-13 IMAGING — MR MR MRA HEAD W/O CM
9 of 11 series · 29 of 48 positions shown · non-contrast
Comparison: Prior CT from 04/03/2016.

EXAM:
MRI HEAD WITHOUT CONTRAST

MRA HEAD WITHOUT CONTRAST
TECHNIQUE: Multiplanar, multiecho pulse sequences of the brain and surrounding
structures were obtained without intravenous contrast. Angiographic
images of the head were obtained using MRA technique without
contrast.

[Series 2: FLAIR · sagittal · 5.0mm · 0.47mm/px · 1 of 25 slices shown (1 of 2)]
[im 1/25]
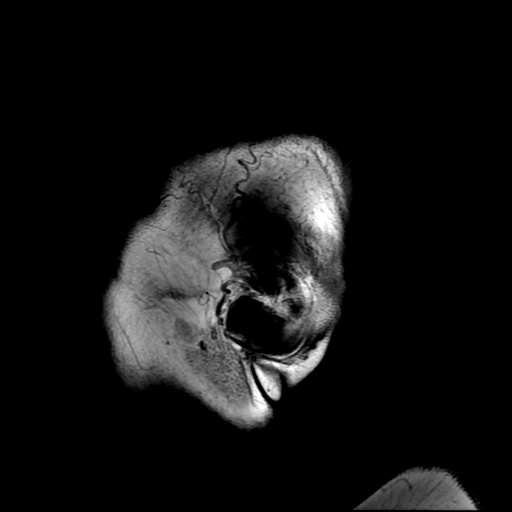

[Series 4: DWI · axial · 3.0mm · 0.94mm/px · z∈[-17,+134]mm · 7 of 104 slices shown (1 of 2)]
[im 1/104]
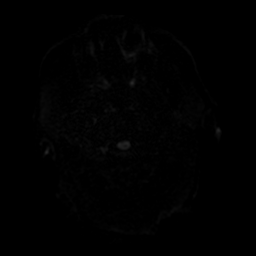
[im 18/104]
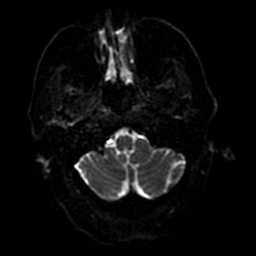
[im 35/104]
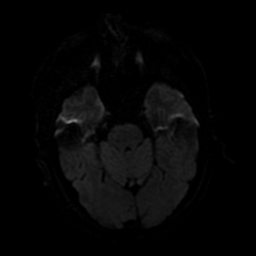
[im 52/104]
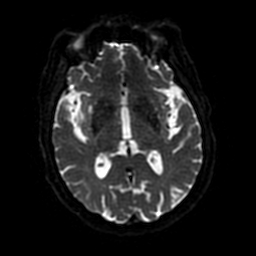
[im 69/104]
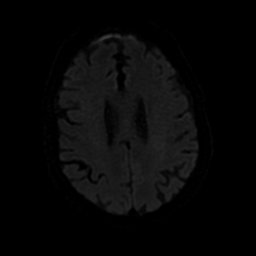
[im 86/104]
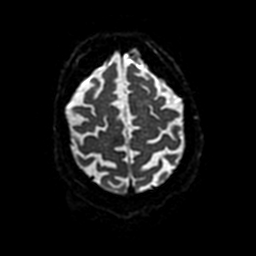
[im 104/104]
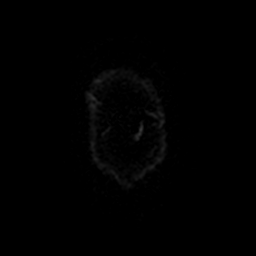

[Series 5: ax (id) 2 · axial · 1.0mm · 0.43mm/px · z∈[-16,+38]mm · 5 of 206 slices shown]
[im 1/206]
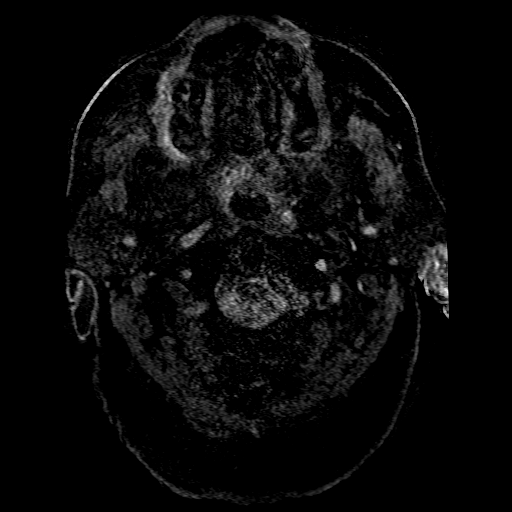
[im 32/206]
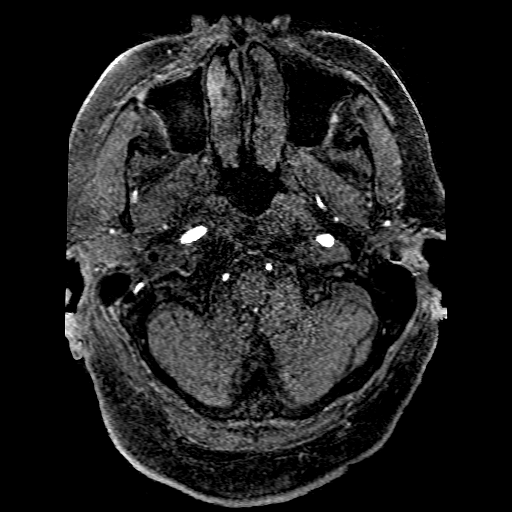
[im 64/206]
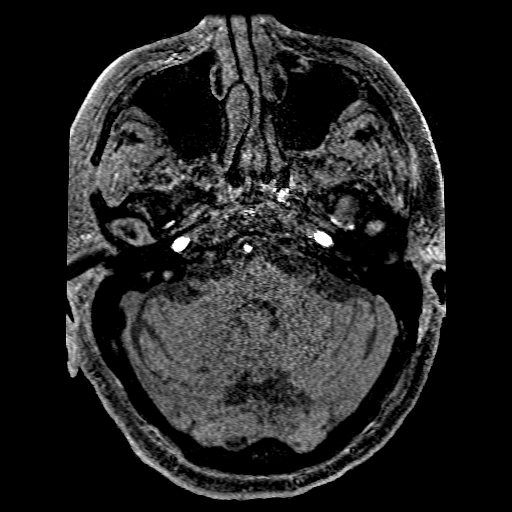
[im 95/206]
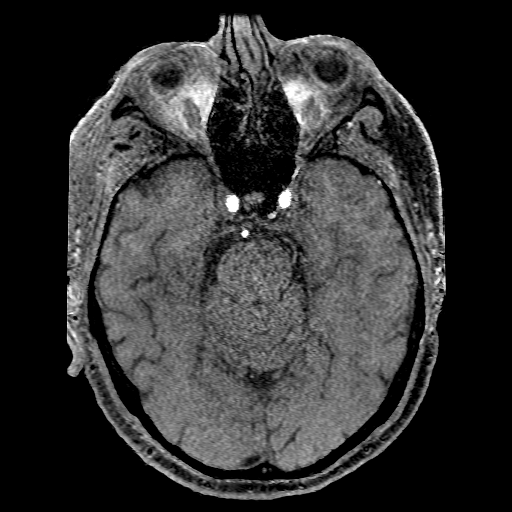
[im 111/206]
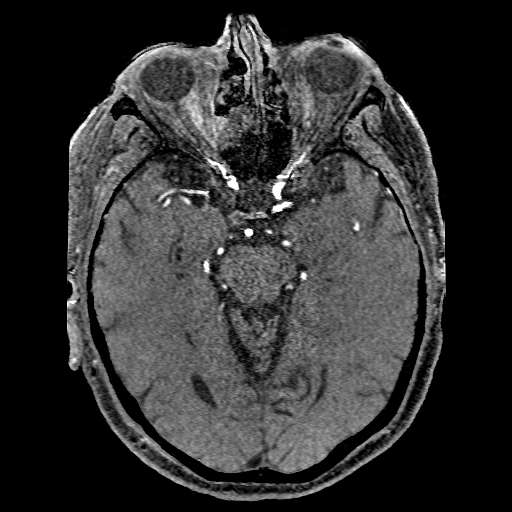

[Series 6: T2 · axial · 5.0mm · 0.47mm/px · z∈[-16,+133]mm · 2 of 26 slices shown (1 of 2)]
[im 1/26]
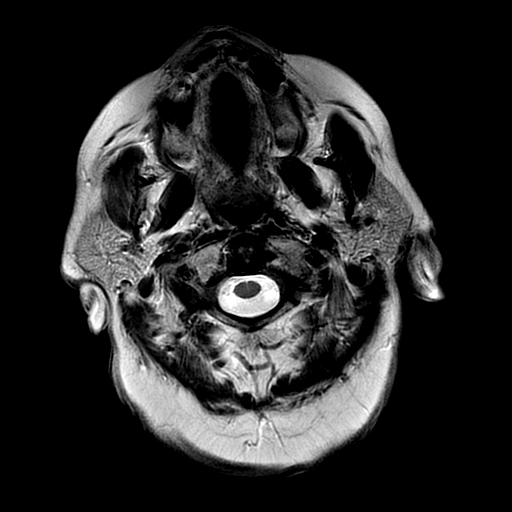
[im 26/26]
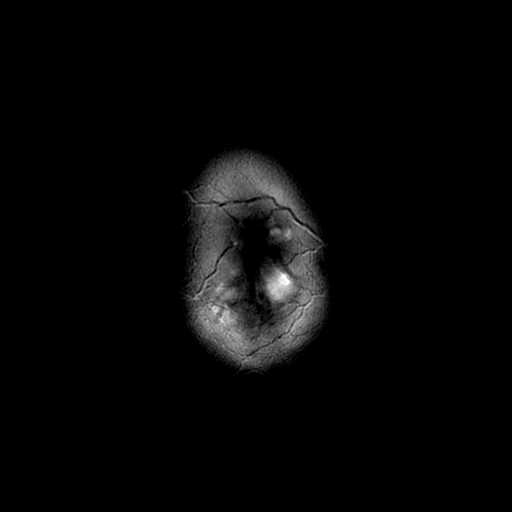

[Series 7: FLAIR · axial · 5.0mm · 0.47mm/px · z∈[-16,+133]mm · 2 of 26 slices shown (2 of 2)]
[im 1/26]
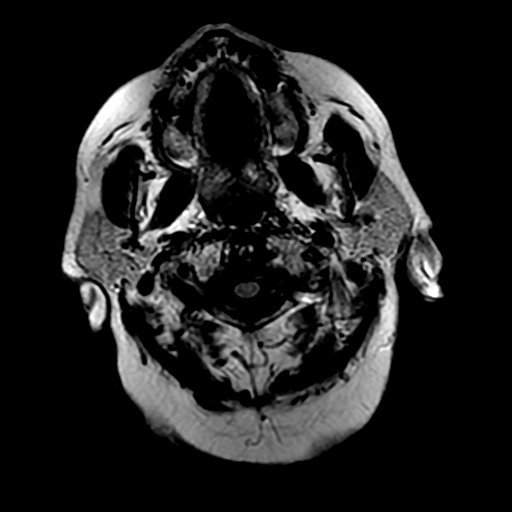
[im 26/26]
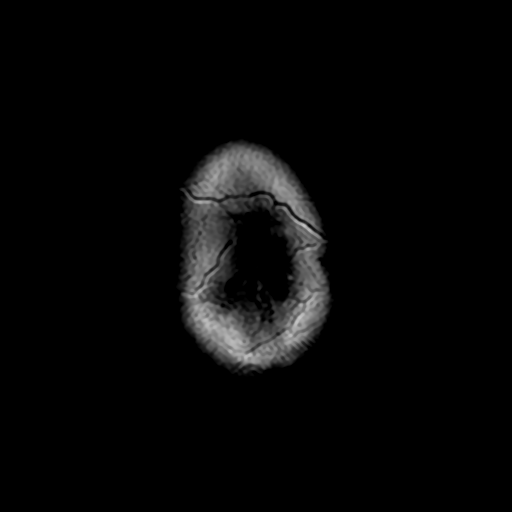

[Series 8: DWI · coronal · 4.0mm · 0.94mm/px · 5 of 72 slices shown (2 of 2)]
[im 1/72]
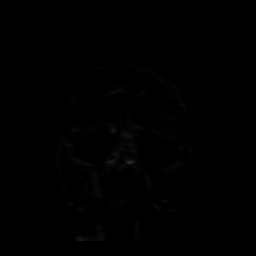
[im 18/72]
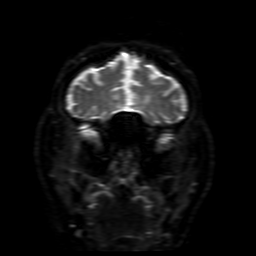
[im 36/72]
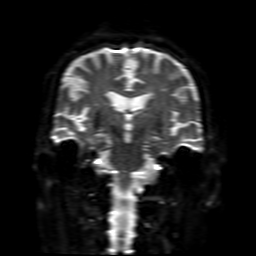
[im 54/72]
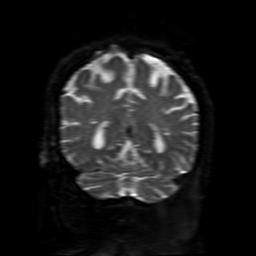
[im 72/72]
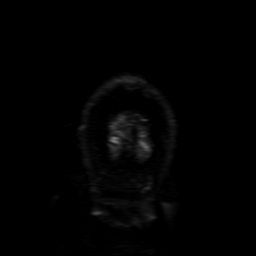

[Series 11: T2 · coronal · 5.0mm · 0.39mm/px · 2 of 30 slices shown (2 of 2)]
[im 1/30]
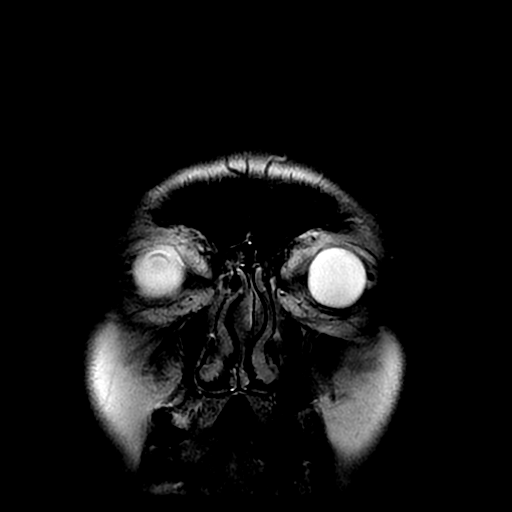
[im 30/30]
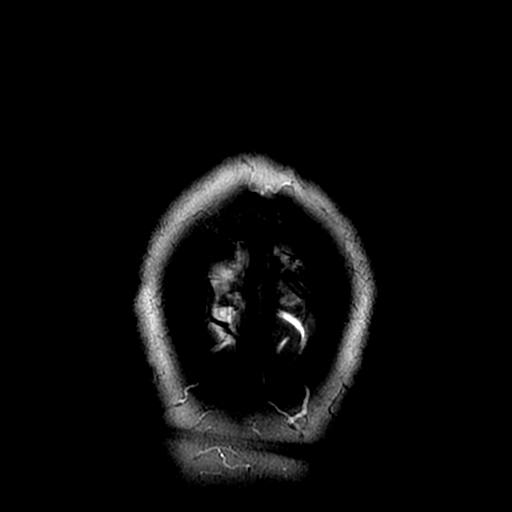

[Series 450: ADC · axial · 3.0mm · 0.94mm/px · z∈[-17,+134]mm · 3 of 51 slices shown (1 of 2)]
[im 1/51]
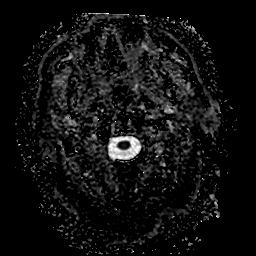
[im 26/51]
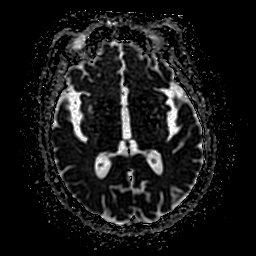
[im 51/51]
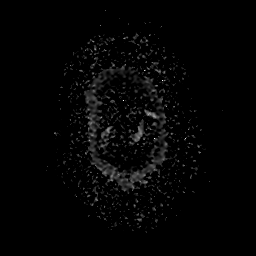

[Series 850: ADC · coronal · 4.0mm · 0.94mm/px · 2 of 36 slices shown (2 of 2)]
[im 1/36]
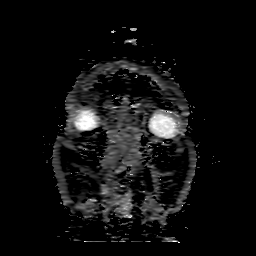
[im 36/36]
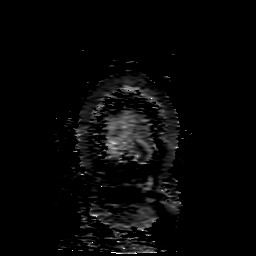

[29 of 48 positions shown; findings below may reference images not displayed]

FINDINGS: MRI HEAD FINDINGS

Brain: Diffuse prominence of the CSF containing spaces is compatible
with generalized cerebral atrophy. Patchy and confluent T2/FLAIR
hyperintensity within the periventricular and deep white matter both
cerebral hemispheres most consistent with chronic microvascular
ischemic disease. Scattered remote lacunar infarcts present within
the bilateral basal ganglia and periventricular white matter.

No abnormal foci of restricted diffusion to suggest acute or
subacute ischemia. Minimal patchy diffusion abnormality within the
left periatrial white matter demonstrates no associated ADC
correlate, and felt to reflect T2 shine through. Gray-white matter
differentiation maintained. No evidence for acute or chronic
intracranial hemorrhage.

No mass lesion, midline shift, or mass effect. No hydrocephalus. No
extra-axial fluid collection. Major dural sinuses are grossly
patent.

Incidental note made of an empty sella.

Vascular: Major intracranial vascular flow voids maintained.

Skull and upper cervical spine: Craniocervical junction normal.
Visualized upper cervical spine unremarkable. Bone marrow signal
intensity within normal limits. No scalp soft tissue abnormality.

Sinuses/Orbits: Globes and orbital soft tissues within normal
limits. Patient is status post lens extraction bilaterally.
Paranasal sinuses are largely clear. No mastoid effusion. Inner ear
structures normal.

Other: None

MRA HEAD FINDINGS

ANTERIOR CIRCULATION:

Distal cervical segments of the internal carotid arteries are patent
with antegrade flow. Petrous segments patent bilaterally.
Atheromatous regularity within the cavernous/ supraclinoid ICAs
without flow-limiting stenosis. Short-segment moderate stenosis
proximal right A1 segment (series 5, image 131). A1 segments
otherwise patent. Anterior cerebral arteries demonstrate mild
atheromatous irregularity but are patent to their distal aspects.
Multifocal atheromatous irregularity within the M1 segments
bilaterally without flow limiting stenosis. No proximal M2 occlusion
distal small vessel atheromatous irregularity within the MCA
branches bilaterally.

POSTERIOR CIRCULATION:

Vertebral arteries patent to the vertebrobasilar junction. Posterior
inferior cerebral arteries patent proximally. Basilar artery widely
patent without stenosis. Superior cerebral arteries patent
bilaterally. Right PCA supplied via the basilar artery and is widely
patent to its distal aspect. Fetal type left PCA supplied via a
widely patent left posterior communicating artery. Atheromatous
regularity with distal severe left P2 stenosis (series 555, image
12).

No aneurysm or vascular malformation.
IMPRESSION: MRI HEAD IMPRESSION:

1. No acute intracranial infarct or other process identified.
2. Scattered remote lacunar infarcts involving the bilateral basal
ganglia and periventricular white matter.
3. Mild chronic microvascular ischemic disease.

MRA HEAD IMPRESSION:

1. Negative MRA for large or proximal branch occlusion. No
correctable stenosis.
2. Mild to moderate intracranial atherosclerotic disease involving
the anterior and posterior circulations as above. Most notable
findings include a moderate right A1 stenosis and severe distal left
P2 stenosis.

## 2018-09-28 DIAGNOSIS — M1711 Unilateral primary osteoarthritis, right knee: Secondary | ICD-10-CM | POA: Diagnosis not present

## 2018-10-05 DIAGNOSIS — M25561 Pain in right knee: Secondary | ICD-10-CM | POA: Diagnosis not present

## 2018-10-05 DIAGNOSIS — M1711 Unilateral primary osteoarthritis, right knee: Secondary | ICD-10-CM | POA: Diagnosis not present

## 2018-10-10 DIAGNOSIS — I70203 Unspecified atherosclerosis of native arteries of extremities, bilateral legs: Secondary | ICD-10-CM | POA: Diagnosis not present

## 2018-10-10 DIAGNOSIS — B351 Tinea unguium: Secondary | ICD-10-CM | POA: Diagnosis not present

## 2018-10-10 DIAGNOSIS — L84 Corns and callosities: Secondary | ICD-10-CM | POA: Diagnosis not present

## 2018-10-10 DIAGNOSIS — M79676 Pain in unspecified toe(s): Secondary | ICD-10-CM | POA: Diagnosis not present

## 2018-10-11 DIAGNOSIS — L821 Other seborrheic keratosis: Secondary | ICD-10-CM | POA: Diagnosis not present

## 2018-10-11 DIAGNOSIS — D485 Neoplasm of uncertain behavior of skin: Secondary | ICD-10-CM | POA: Diagnosis not present

## 2018-10-11 DIAGNOSIS — D224 Melanocytic nevi of scalp and neck: Secondary | ICD-10-CM | POA: Diagnosis not present

## 2018-10-11 DIAGNOSIS — D1801 Hemangioma of skin and subcutaneous tissue: Secondary | ICD-10-CM | POA: Diagnosis not present

## 2018-10-11 DIAGNOSIS — L570 Actinic keratosis: Secondary | ICD-10-CM | POA: Diagnosis not present

## 2018-10-12 DIAGNOSIS — M1711 Unilateral primary osteoarthritis, right knee: Secondary | ICD-10-CM | POA: Diagnosis not present

## 2018-10-26 DIAGNOSIS — L988 Other specified disorders of the skin and subcutaneous tissue: Secondary | ICD-10-CM | POA: Diagnosis not present

## 2018-10-26 DIAGNOSIS — D485 Neoplasm of uncertain behavior of skin: Secondary | ICD-10-CM | POA: Diagnosis not present

## 2018-10-29 ENCOUNTER — Other Ambulatory Visit: Payer: Self-pay | Admitting: Family Medicine

## 2018-10-31 ENCOUNTER — Other Ambulatory Visit: Payer: Self-pay | Admitting: Family Medicine

## 2018-11-02 ENCOUNTER — Other Ambulatory Visit: Payer: Self-pay | Admitting: Family Medicine

## 2018-11-06 ENCOUNTER — Other Ambulatory Visit: Payer: Self-pay

## 2018-11-07 ENCOUNTER — Encounter: Payer: Self-pay | Admitting: Family Medicine

## 2018-11-07 ENCOUNTER — Ambulatory Visit (INDEPENDENT_AMBULATORY_CARE_PROVIDER_SITE_OTHER): Payer: Medicare Other | Admitting: Family Medicine

## 2018-11-07 VITALS — BP 150/58 | HR 69 | Temp 98.0°F | Ht 71.0 in | Wt 241.4 lb

## 2018-11-07 DIAGNOSIS — E782 Mixed hyperlipidemia: Secondary | ICD-10-CM | POA: Diagnosis not present

## 2018-11-07 DIAGNOSIS — Z23 Encounter for immunization: Secondary | ICD-10-CM | POA: Diagnosis not present

## 2018-11-07 DIAGNOSIS — I10 Essential (primary) hypertension: Secondary | ICD-10-CM

## 2018-11-07 DIAGNOSIS — E039 Hypothyroidism, unspecified: Secondary | ICD-10-CM

## 2018-11-07 MED ORDER — METOPROLOL SUCCINATE ER 50 MG PO TB24: 50.0000 mg | ORAL_TABLET | Freq: Every day | ORAL | 1 refills | Status: DC

## 2018-11-07 MED ORDER — ATORVASTATIN CALCIUM 20 MG PO TABS: ORAL_TABLET | ORAL | 1 refills | Status: DC

## 2018-11-07 MED ORDER — AMLODIPINE BESYLATE 10 MG PO TABS: 10.0000 mg | ORAL_TABLET | Freq: Every day | ORAL | 3 refills | Status: DC

## 2018-11-07 MED ORDER — FEXOFENADINE-PSEUDOEPHED ER 180-240 MG PO TB24: 1.0000 | ORAL_TABLET | Freq: Every evening | ORAL | 11 refills | Status: DC

## 2018-11-07 NOTE — Patient Instructions (Signed)

## 2018-11-07 NOTE — Progress Notes (Signed)
Subjective:  Patient ID: [name], male    DOB: 28-May-1932  Age: 85 y.o. MRN: [id number]  CC: Hyperlipidemia (6 month follow up)   HPI [name] presents for  follow-up of hypertension. Patient has no history of headache chest pain or shortness of breath or recent cough. Patient also denies symptoms of TIA such as focal numbness or weakness. States taking it regularly. Lisinopril makes him dizzy.   in for follow-up of elevated cholesterol. Doing well without complaints on current medication. Denies side effects of statin including myalgia and arthralgia and nausea. Currently no chest pain, shortness of breath or other cardiovascular related symptoms noted.   follow-up on  thyroid. The patient has a history of hypothyroidism for many years. It has been stable recently. Pt. denies any change in  voice, loss of hair, heat or cold intolerance. Energy level has been adequate to good. Patient denies constipation and diarrhea. No myxedema. Medication is as noted below. Verified that pt is taking it daily on an empty stomach. Well tolerated.    History [name] has a past medical history of Arthritis, BNC (bladder neck contracture), BPPV (benign paroxysmal positional vertigo), Cataract, Diverticulosis, H/O diarrhea (SECONDARY TO RADIATION THERAPY --  INTERMITANT  DIARRHEA), Hematuria, History of hemorrhagic cystitis, History of prostate cancer (CURRENTLY  ELEVATED PSA--  LUPRON INJECTIONS), Hyperlipidemia, Hypertension, Hypothyroidism, Radiation cystitis, Radiation proctitis, Urinary incontinence, Urinary retention, and Vitamin D deficiency.   He has a past surgical history that includes Prostatectomy (1997); Colonoscopy w/ polypectomy (2005; 08/11/2010); CYSTO/ FULGERATION OF BLEEDERS/ RESECTION BLADDER NECK CONTRACTURE (07-06-2004); Cataract extraction w/ intraocular lens implant (Right); Lumbar disc surgery (1996); Transurethral resection of bladder neck (N/A, 06/23/2012); Transurethral  resection of bladder tumor with gyrus (turbt-gyrus) (N/A, 06/01/2013); Eye surgery; Spine surgery; and Knee arthroscopy (Right).   His family history includes Alzheimer's disease in his brother; Appendicitis (age of onset: 106) in his sister; CVA (age of onset: 28) in his mother; Cancer in his sister and sister; Congestive Heart Failure in his mother; Early death in his sister; Hip fracture in his brother; Kidney disease in his brother and sister; Obesity in his sister; Pancreatitis in his father; Pneumonia in his father; Prostate cancer in his brother.He reports that he quit smoking about 55 years ago. His smoking use included cigarettes. He quit after 20.00 years of use. He quit smokeless tobacco use about 55 years ago.  His smokeless tobacco use included chew. He reports that he does not drink alcohol or use drugs.  Current Outpatient Medications on File Prior to Visit  Medication Sig Dispense Refill   aspirin 325 MG tablet Take 1 tablet (325 mg total) by mouth daily.     Cholecalciferol (VITAMIN D3) 5000 UNITS CAPS Take 1 capsule by mouth daily.     Elastic Bandages & Supports (V-2 HIGH COMPRESSION HOSE) MISC Wear daily when up walking around 1 each 0   FLAXSEED, LINSEED, PO Take 30 mLs by mouth every morning. GRINDS FLAX SEEDS AND PUTS IN 1/4 CUP OF WATER     GARLIC PO Take 2 capsules by mouth daily.     Leuprolide Acetate, 6 Month, (LUPRON DEPOT) 45 MG injection Inject 45 mg into the muscle every 6 (six) months.      levothyroxine (SYNTHROID, LEVOTHROID) 88 MCG tablet TAKE 1 TABLET BY MOUTH ONCE DAILY BEFORE BREAKFAST 90 tablet 3   Magnesium 300 MG CAPS Take 1 capsule by mouth daily.     Probiotic Product (PROBIOTIC DAILY PO) Take 1 capsule  by mouth daily.     No current facility-administered medications on file prior to visit.     ROS Review of Systems  Constitutional: Negative.   HENT: Negative.   Eyes: Negative for visual disturbance.  Respiratory: Negative for cough and  shortness of breath.   Cardiovascular: Negative for chest pain and leg swelling.  Gastrointestinal: Negative for abdominal pain, diarrhea, nausea and vomiting.  Genitourinary: Negative for difficulty urinating.  Musculoskeletal: Positive for arthralgias (right knee Under care of [facility] - getting injections q 52mo ). Negative for myalgias.  Skin: Negative for rash.  Neurological: Negative for headaches.  Psychiatric/Behavioral: Negative for sleep disturbance.    Objective:  BP (!) 150/58    Pulse 69    Temp 98 F (36.7 C) (Temporal)    Ht _0  (1.803 m)    Wt 241 lb 6.4 oz (109.5 kg)    SpO2 97%    BMI 33.67 kg/m   BP Readings from Last 3 Encounters:  11/07/18 (!) 150/58  04/05/18 (!) 161/68  01/30/18 (!) 163/81    Wt Readings from Last 3 Encounters:  11/07/18 241 lb 6.4 oz (109.5 kg)  04/05/18 246 lb (111.6 kg)  01/30/18 245 lb (111.1 kg)     Physical Exam Constitutional:      General: He is not in acute distress.    Appearance: He is well-developed.  HENT:     Head: Normocephalic and atraumatic.     Right Ear: External ear normal.     Left Ear: External ear normal.     Nose: Nose normal.  Eyes:     Conjunctiva/sclera: Conjunctivae normal.     Pupils: Pupils are equal, round, and reactive to light.  Neck:     Musculoskeletal: Normal range of motion and neck supple.  Cardiovascular:     Rate and Rhythm: Normal rate and regular rhythm.     Heart sounds: Normal heart sounds. No murmur.  Pulmonary:     Effort: Pulmonary effort is normal. No respiratory distress.     Breath sounds: Normal breath sounds. No wheezing or rales.  Abdominal:     Palpations: Abdomen is soft.     Tenderness: There is no abdominal tenderness.  Musculoskeletal: Normal range of motion.  Skin:    General: Skin is warm and dry.  Neurological:     Mental Status: He is alert and oriented to person, place, and time.     Deep Tendon Reflexes: Reflexes are normal and symmetric.    Psychiatric:        Behavior: Behavior normal.        Thought Content: Thought content normal.        Judgment: Judgment normal.       Assessment & Plan:   HAirrionwas seen today for hyperlipidemia.  Diagnoses and all orders for this visit:  Acquired hypothyroidism -     CBC with Differential/Platelet -     CMP14+EGFR -     TSH + free T4  Essential hypertension -     amLODipine (NORVASC) 10 MG tablet; Take 1 tablet (10 mg total) by mouth daily. -     CBC with Differential/Platelet -     CMP14+EGFR  Need for immunization against influenza -     Flu Vaccine QUAD High Dose(Fluad)  Mixed hyperlipidemia -     CBC with Differential/Platelet -     CMP14+EGFR -     Lipid panel  Other orders -     atorvastatin (LIPITOR)  20 MG tablet; 1 tablet daily -     metoprolol succinate (TOPROL-XL) 50 MG 24 hr tablet; Take 1 tablet (50 mg total) by mouth daily. For blood pressure control -     fexofenadine-pseudoephedrine (ALLEGRA-D 24) 180-240 MG 24 hr tablet; Take 1 tablet by mouth every evening. For allergy and congestion   Allergies as of 11/07/2018      Reactions   Caffeine Palpitations   Codeine Palpitations      Medication List       Accurate as of November 07, 2018  5:39 PM. If you have any questions, ask your nurse or doctor.        STOP taking these medications   lisinopril 10 MG tablet Commonly known as: ZESTRIL Stopped by: Claretta Fraise, MD     TAKE these medications   amLODipine 10 MG tablet Commonly known as: NORVASC Take 1 tablet (10 mg total) by mouth daily.   aspirin 325 MG tablet Take 1 tablet (325 mg total) by mouth daily.   atorvastatin 20 MG tablet Commonly known as: LIPITOR 1 tablet daily What changed: See the new instructions. Changed by: Claretta Fraise, MD   fexofenadine-pseudoephedrine 180-240 MG 24 hr tablet Commonly known as: ALLEGRA-D 24 Take 1 tablet by mouth every evening. For allergy and congestion Started by: Claretta Fraise, MD    FLAXSEED (LINSEED) PO Take 30 mLs by mouth every morning. GRINDS FLAX SEEDS AND PUTS IN 1/4 CUP OF WATER   GARLIC PO Take 2 capsules by mouth daily.   levothyroxine 88 MCG tablet Commonly known as: SYNTHROID TAKE 1 TABLET BY MOUTH ONCE DAILY BEFORE BREAKFAST   Lupron Depot (59-Month) 45 MG injection Generic drug: Leuprolide Acetate (6 Month) Inject 45 mg into the muscle every 6 (six) months.   Magnesium 300 MG Caps Take 1 capsule by mouth daily.   metoprolol succinate 50 MG 24 hr tablet Commonly known as: TOPROL-XL Take 1 tablet (50 mg total) by mouth daily. For blood pressure control Started by: Claretta Fraise, MD   PROBIOTIC DAILY PO Take 1 capsule by mouth daily.   V-2 High Compression Hose Misc Wear daily when up walking around   Vitamin D3 125 MCG (5000 UT) Caps Take 1 capsule by mouth daily.       Meds ordered this encounter  Medications   amLODipine (NORVASC) 10 MG tablet    Sig: Take 1 tablet (10 mg total) by mouth daily.    Dispense:  90 tablet    Refill:  3   atorvastatin (LIPITOR) 20 MG tablet    Sig: 1 tablet daily    Dispense:  90 tablet    Refill:  1   metoprolol succinate (TOPROL-XL) 50 MG 24 hr tablet    Sig: Take 1 tablet (50 mg total) by mouth daily. For blood pressure control    Dispense:  90 tablet    Refill:  1   fexofenadine-pseudoephedrine (ALLEGRA-D 24) 180-240 MG 24 hr tablet    Sig: Take 1 tablet by mouth every evening. For allergy and congestion    Dispense:  30 tablet    Refill:  11      Follow-up: Return in about 6 months (around 05/08/2019) for hypertension, Hypothyroidism, cholesterol, Arthritis.  Claretta Fraise, M.D.

## 2018-11-08 LAB — CMP14+EGFR
ALT: 16 IU/L (ref 0–44)
AST: 19 IU/L (ref 0–40)
Albumin/Globulin Ratio: 1.9 (ref 1.2–2.2)
Albumin: 4.5 g/dL (ref 3.6–4.6)
Alkaline Phosphatase: 67 IU/L (ref 39–117)
BUN/Creatinine Ratio: 16 (ref 10–24)
BUN: 15 mg/dL (ref 8–27)
Bilirubin Total: 0.5 mg/dL (ref 0.0–1.2)
CO2: 25 mmol/L (ref 20–29)
Calcium: 9.5 mg/dL (ref 8.6–10.2)
Chloride: 104 mmol/L (ref 96–106)
Creatinine, Ser: 0.92 mg/dL (ref 0.76–1.27)
GFR calc Af Amer: 88 mL/min/{1.73_m2} (ref >59)
GFR calc non Af Amer: 76 mL/min/{1.73_m2} (ref >59)
Globulin, Total: 2.4 g/dL (ref 1.5–4.5)
Glucose: 102 mg/dL — ABNORMAL HIGH (ref 65–99)
Potassium: 5.1 mmol/L (ref 3.5–5.2)
Sodium: 140 mmol/L (ref 134–144)
Total Protein: 6.9 g/dL (ref 6.0–8.5)

## 2018-11-08 LAB — CBC WITH DIFFERENTIAL/PLATELET
Basophils Absolute: 0 10*3/uL (ref 0.0–0.2)
Basos: 1 %
EOS (ABSOLUTE): 0.4 10*3/uL (ref 0.0–0.4)
Eos: 5 %
Hematocrit: 43.5 % (ref 37.5–51.0)
Hemoglobin: 14.5 g/dL (ref 13.0–17.7)
Immature Grans (Abs): 0 10*3/uL (ref 0.0–0.1)
Immature Granulocytes: 0 %
Lymphocytes Absolute: 1.9 10*3/uL (ref 0.7–3.1)
Lymphs: 29 %
MCH: 31.3 pg (ref 26.6–33.0)
MCHC: 33.3 g/dL (ref 31.5–35.7)
MCV: 94 fL (ref 79–97)
Monocytes Absolute: 0.6 10*3/uL (ref 0.1–0.9)
Monocytes: 9 %
Neutrophils Absolute: 3.6 10*3/uL (ref 1.4–7.0)
Neutrophils: 56 %
Platelets: 230 10*3/uL (ref 150–450)
RBC: 4.63 x10E6/uL (ref 4.14–5.80)
RDW: 12.3 % (ref 11.6–15.4)
WBC: 6.5 10*3/uL (ref 3.4–10.8)

## 2018-11-08 LAB — LIPID PANEL
Chol/HDL Ratio: 2.5 ratio (ref 0.0–5.0)
Cholesterol, Total: 129 mg/dL (ref 100–199)
HDL: 52 mg/dL (ref >39)
LDL Chol Calc (NIH): 52 mg/dL (ref 0–99)
Triglycerides: 150 mg/dL — ABNORMAL HIGH (ref 0–149)
VLDL Cholesterol Cal: 25 mg/dL (ref 5–40)

## 2018-11-08 LAB — TSH+FREE T4
Free T4: 1.81 ng/dL — ABNORMAL HIGH (ref 0.82–1.77)
TSH: 2.06 u[IU]/mL (ref 0.450–4.500)

## 2018-11-08 NOTE — Progress Notes (Signed)
Hello Ranen,  Your lab result is normal and/or stable.Some minor variations that are not significant are commonly marked abnormal, but do not represent any medical problem for you.  Best regards, Claretta Fraise, M.D.

## 2018-11-09 DIAGNOSIS — Z4802 Encounter for removal of sutures: Secondary | ICD-10-CM | POA: Diagnosis not present

## 2019-01-03 DIAGNOSIS — C61 Malignant neoplasm of prostate: Secondary | ICD-10-CM | POA: Diagnosis not present

## 2019-01-09 DIAGNOSIS — I70203 Unspecified atherosclerosis of native arteries of extremities, bilateral legs: Secondary | ICD-10-CM | POA: Diagnosis not present

## 2019-01-09 DIAGNOSIS — L84 Corns and callosities: Secondary | ICD-10-CM | POA: Diagnosis not present

## 2019-01-09 DIAGNOSIS — B351 Tinea unguium: Secondary | ICD-10-CM | POA: Diagnosis not present

## 2019-01-09 DIAGNOSIS — M79676 Pain in unspecified toe(s): Secondary | ICD-10-CM | POA: Diagnosis not present

## 2019-02-08 ENCOUNTER — Ambulatory Visit (INDEPENDENT_AMBULATORY_CARE_PROVIDER_SITE_OTHER): Payer: Medicare Other | Admitting: *Deleted

## 2019-02-08 ENCOUNTER — Telehealth: Payer: Self-pay | Admitting: *Deleted

## 2019-02-08 DIAGNOSIS — Z0000 Encounter for general adult medical examination without abnormal findings: Secondary | ICD-10-CM

## 2019-02-08 NOTE — Telephone Encounter (Signed)
Fish oil would be fine, but usually doesn't add much to what the atorvastatin iis already doing.

## 2019-02-08 NOTE — Telephone Encounter (Signed)
Pt and wife aware

## 2019-02-08 NOTE — Telephone Encounter (Signed)
While on the phone with the pt today doing his AWV he asked if it would be beneficial for him to take fish oil daily. 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 He is on atorvastatin and his last lipid panel looked great in 10/2018.

## 2019-02-08 NOTE — Progress Notes (Signed)
MEDICARE ANNUAL WELLNESS VISIT  02/08/2019  Telephone Visit Disclaimer This Medicare AWV was conducted by telephone due to national recommendations for restrictions regarding the COVID-19 Pandemic (e.g. social distancing).  I verified, using two identifiers, that I am speaking with [name] or their authorized healthcare agent. I discussed the limitations, risks, security, and privacy concerns of performing an evaluation and management service by telephone and the potential availability of an in-person appointment in the future. The patient expressed understanding and agreed to proceed.   Subjective:  [name] is a 86 y.o. male patient of Stacks, Cletus Gash, MD who had a Medicare Annual Wellness Visit today via telephone. [name] is Retired and lives with their spouse. he has 2 children. he reports that he is socially active and does interact with friends/family regularly. he is moderately physically active and enjoys reading the Bible, [employer], magazines, Consumer Report, watching TV and walking daily for exercise.  Patient Care Team: Claretta Fraise, MD as PCP - General (Family Medicine) Kathie Rhodes, MD as Consulting Physician (Urology) Sandford Craze, MD as Consulting Physician (Dermatology) Hayden Pedro, MD as Consulting Physician (Ophthalmology) Netta Cedars, MD as Consulting Physician (Orthopedic Surgery) Rosalin Hawking, MD as Consulting Physician (Neurology)  Advanced Directives 02/08/2019 01/30/2018 01/20/2017 12/14/2016 10/05/2016 04/04/2016 04/03/2016  Does Patient Have a Medical Advance Directive? Yes Yes Yes No No No No  Type of Paramedic of [place];Living will [place];Living will [place];Living will - - - -  Does patient want to make changes to medical advance directive? No - Patient declined No - Patient declined No - Patient declined - - - -  Copy of [place] in Chart? No -  copy requested No - copy requested No - copy requested - - - -  Would patient like information on creating a medical advance directive? - - - - - No - Patient declined -    Hospital Utilization Over the Past 12 Months: # of hospitalizations or ER visits: 0 # of surgeries: 0  Review of Systems    Patient reports that his overall health is unchanged compared to last year.  History obtained from chart review  Patient Reported Readings (BP, Pulse, CBG, Weight, etc) none  Pain Assessment Pain : No/denies pain     Current Medications & Allergies (verified) Allergies as of 02/08/2019      Reactions   Caffeine Palpitations   Codeine Palpitations      Medication List       Accurate as of February 08, 2019  8:55 AM. If you have any questions, ask your nurse or doctor.        STOP taking these medications   fexofenadine-pseudoephedrine 180-240 MG 24 hr tablet Commonly known as: ALLEGRA-D 24     TAKE these medications   amLODipine 10 MG tablet Commonly known as: NORVASC Take 1 tablet (10 mg total) by mouth daily.   aspirin 325 MG tablet Take 1 tablet (325 mg total) by mouth daily.   atorvastatin 20 MG tablet Commonly known as: LIPITOR 1 tablet daily   CENTRUM SILVER ULTRA MENS PO Take 1 tablet by mouth daily.   FLAXSEED (LINSEED) PO Take 30 mLs by mouth every morning. GRINDS FLAX SEEDS AND PUTS IN 1/4 CUP OF WATER   GARLIC PO Take 2 capsules by mouth daily.   levothyroxine 88 MCG tablet Commonly known as: SYNTHROID TAKE 1 TABLET BY MOUTH ONCE DAILY BEFORE BREAKFAST  lisinopril 10 MG tablet Commonly known as: ZESTRIL Take 10 mg by mouth daily.   Lupron Depot (60-Month) 45 MG injection Generic drug: Leuprolide Acetate (6 Month) Inject 45 mg into the muscle every 6 (six) months.   Magnesium 300 MG Caps Take 1 capsule by mouth daily.   metoprolol succinate 50 MG 24 hr tablet Commonly known as: TOPROL-XL Take 1 tablet (50 mg total) by mouth daily. For blood  pressure control   PROBIOTIC DAILY PO Take 1 capsule by mouth daily.   V-2 High Compression Hose Misc Wear daily when up walking around   Vitamin D3 125 MCG (5000 UT) Caps Take 1 capsule by mouth daily.       History (reviewed): Past Medical History:  Diagnosis Date  . Arthritis   . BNC (bladder neck contracture)   . BPPV (benign paroxysmal positional vertigo)   . Cataract    bilateral  . Diverticulosis   . H/O diarrhea SECONDARY TO RADIATION THERAPY --  INTERMITANT  DIARRHEA  . Hematuria   . History of hemorrhagic cystitis    SECONDARY RADIATION THERAPY 1997  . History of prostate cancer CURRENTLY  ELEVATED PSA--  LUPRON INJECTIONS   S/P PROSTATECTOMY AND RADIATION THERAPY  1997  . Hyperlipidemia   . Hypertension   . Hypothyroidism   . Radiation cystitis   . Radiation proctitis   . Urinary incontinence   . Urinary retention   . Vitamin D deficiency    Past Surgical History:  Procedure Laterality Date  . CATARACT EXTRACTION W/ INTRAOCULAR LENS IMPLANT Right   . COLONOSCOPY W/ POLYPECTOMY  2005; 08/11/2010   2005: 1 cm hyperplastic sigmoid polyp, Dr. Wynetta Emery 2012: hyperplastic splenic flexure polyp -1 cm, diverticulosis, radiation proctitis, anal stenosis  . CYSTO/ FULGERATION OF BLEEDERS/ RESECTION BLADDER NECK CONTRACTURE  07-06-2004   BNC AND RADIATION CYSTITIS  . EYE SURGERY    . KNEE ARTHROSCOPY Right   . LUMBAR DISC SURGERY  1996   L4 -- L5  . PROSTATECTOMY  1997  . SPINE SURGERY    . TRANSURETHRAL RESECTION OF BLADDER NECK N/A 06/23/2012   Procedure: TRANSURETHRAL RESECTION OF BLADDER NECK CONTRACTURE WITH GYRUS;  Surgeon: Claybon Jabs, MD;  Location: [facility];  Service: Urology;  Laterality: N/A;  . TRANSURETHRAL RESECTION OF BLADDER TUMOR WITH GYRUS (TURBT-GYRUS) N/A 06/01/2013   Procedure: TRANSURETHRAL RESECTION OF BLADDER NECK CONTRACTURE WITH GYRUS (TURBT-GYRUS) AND INJECTION OF KENALOG;  Surgeon: Claybon Jabs, MD;  Location: [facility];  Service: Urology;  Laterality: N/A;   Family History  Problem Relation Age of Onset  . Prostate cancer Brother   . Kidney disease Brother   . Congestive Heart Failure Mother   . CVA Mother 9  . Pancreatitis Father   . Pneumonia Father   . Cancer Sister        breast  . Obesity Sister   . Early death Sister        appendicitis  . Appendicitis Sister 20       rupture  . Cancer Sister        breast  . Kidney disease Sister   . Alzheimer's disease Brother   . Hip fracture Brother    Social History   Socioeconomic History  . Marital status: Married    Spouse name: [name]  . Number of children: 2  . Years of education: 12+  . Highest education level: Some college, no degree  Occupational History  . Occupation: Retired  Comment: Water Filtration System for [employer]  Tobacco Use  . Smoking status: Former Smoker    Years: 20.00    Types: Cigarettes    Quit date: 1965    Years since quitting: 56.0  . Smokeless tobacco: Former Systems developer    Types: Chew    Quit date: 1965  . Tobacco comment: SMOKED AND CHEW TOBACCO FOR 20 YRS QUIT AGE 75  Substance and Sexual Activity  . Alcohol use: No  . Drug use: No  . Sexual activity: Not Currently    Birth control/protection: None  Other Topics Concern  . Not on file  Social History Narrative  . Not on file   Social Determinants of Health   Financial Resource Strain: Low Risk   . Difficulty of Paying Living Expenses: Not hard at all  Food Insecurity: No Food Insecurity  . Worried About Charity fundraiser in the Last Year: Never true  . Ran Out of Food in the Last Year: Never true  Transportation Needs: No Transportation Needs  . Lack of Transportation (Medical): No  . Lack of Transportation (Non-Medical): No  Physical Activity: Sufficiently Active  . Days of Exercise per Week: 7 days  . Minutes of Exercise per Session: 30 min  Stress: No Stress Concern Present  . Feeling of Stress : Not at all  Social  Connections: Somewhat Isolated  . Frequency of Communication with Friends and Family: More than three times a week  . Frequency of Social Gatherings with Friends and Family: More than three times a week  . Attends Religious Services: Never  . Active Member of Clubs or Organizations: No  . Attends Archivist Meetings: Never  . Marital Status: Married    Activities of Daily Living In your present state of health, do you have any difficulty performing the following activities: 02/08/2019  Hearing? N  Vision? N  Comment wears glasses-gets yearly eye exam-has had cataracts removed  Difficulty concentrating or making decisions? N  Walking or climbing stairs? Y  Comment has to use a cane at all times- right knee pain-gets injections and follows with specialist  Dressing or bathing? N  Doing errands, shopping? N  Preparing Food and eating ? N  Using the Toilet? N  In the past six months, have you accidently leaked urine? Y  Comment has had prostatectomy-multiple surgeries on his bladder so he wears depends at all times  Do you have problems with loss of bowel control? Y  Comment when he has to have a BM it hits him suddenly and has had times when he has had fecal incontinence  Managing your Medications? N  Managing your Finances? N  Housekeeping or managing your Housekeeping? N  Some recent data might be hidden    Patient Education/ Literacy How often do you need to have someone help you when you read instructions, pamphlets, or other written materials from your doctor or pharmacy?: 1 - Never What is the last grade level you completed in school?: Some college-No degree  Exercise Current Exercise Habits: Home exercise routine, Type of exercise: walking;stretching, Time (Minutes): 30, Frequency (Times/Week): 7, Weekly Exercise (Minutes/Week): 210, Intensity: Mild, Exercise limited by: orthopedic condition(s)  Diet Patient reports consuming 3 meals a day and 1 snack(s) a  day Patient reports that his primary diet is: Regular Patient reports that she does have regular access to food.   Depression Screen PHQ 2/9 Scores 02/08/2019 11/07/2018 04/05/2018 01/30/2018 10/03/2017 03/31/2017 01/20/2017  PHQ - 2 Score 0  0 0 3 0 0 0  PHQ- 9 Score - - - 3 - - -     Fall Risk Fall Risk  02/08/2019 11/07/2018 04/05/2018 01/30/2018 10/03/2017  Falls in the past year? 0 0 0 0 No  Comment - - - - -  Number falls in past yr: - - - - -  Injury with Fall? - - - - -  Risk for fall due to : Impaired balance/gait - - - -  Risk for fall due to: Comment Uses a cane at all times - - - -  Follow up Falls prevention discussed - - - -  Comment Get rid of all throw rugs in the house, adequate lighting in the walkways and grab bars in the bathroom - - - -     Objective:  [name] seemed alert and oriented and he participated appropriately during our telephone visit.  Blood Pressure Weight BMI  BP Readings from Last 3 Encounters:  11/07/18 (!) 150/58  04/05/18 (!) 161/68  01/30/18 (!) 163/81   Wt Readings from Last 3 Encounters:  11/07/18 241 lb 6.4 oz (109.5 kg)  04/05/18 246 lb (111.6 kg)  01/30/18 245 lb (111.1 kg)   BMI Readings from Last 1 Encounters:  11/07/18 33.67 kg/m    *Unable to obtain current vital signs, weight, and BMI due to telephone visit type  Hearing/Vision  . [name] did not seem to have difficulty with hearing/understanding during the telephone conversation . Reports that he has had a formal eye exam by an eye care professional within the past year . Reports that he has not had a formal hearing evaluation within the past year *Unable to fully assess hearing and vision during telephone visit type  Cognitive Function: 6CIT Screen 02/08/2019  What Year? 0 points  What month? 0 points  What time? 0 points  Count back from 20 0 points  Months in reverse 0 points  Repeat phrase 2 points  Total Score 2   (Normal:0-7, Significant for Dysfunction:  >8)  Normal Cognitive Function Screening: Yes   Immunization & Health Maintenance Record Immunization History  Administered Date(s) Administered  . Fluad Quad(high Dose 65+) 11/07/2018  . Hepatitis B 07/22/1993, 08/26/1993, 04/28/1994  . Influenza, High Dose Seasonal PF 11/10/2015, 11/25/2016, 11/22/2017  . Influenza,inj,Quad PF,6+ Mos 11/07/2013  . Influenza-Unspecified 11/25/2012, 12/09/2014  . Pneumococcal Conjugate-13 01/14/2014  . Pneumococcal Polysaccharide-23 07/04/2012  . Zoster 01/05/2013    Health Maintenance  Topic Date Due  . TETANUS/TDAP  06/28/1951  . INFLUENZA VACCINE  Completed  . PNA vac Low Risk Adult  Completed  . DEXA SCAN  Discontinued       Assessment  This is a routine wellness examination for [employer].  Health Maintenance: Due or Overdue Health Maintenance Due  Topic Date Due  . Samul Dada  06/28/1951    [name] does not need a referral for Community Assistance: Care Management:   no Social Work:    no Prescription Assistance:  no Nutrition/Diabetes Education:  no   Plan:  Personalized Goals Goals Addressed            This Visit's Progress   . Prevent falls       Use your cane at all times when ambulating      Personalized Health Maintenance & Screening Recommendations  Td vaccine  Lung Cancer Screening Recommended: no (Low Dose CT Chest recommended if Age 67-80 years, 30 pack-year currently smoking OR have quit w/in  past 15 years) Hepatitis C Screening recommended: no HIV Screening recommended: no  Advanced Directives: Written information was not prepared per patient's request.  Referrals & Orders No orders of the defined types were placed in this encounter.   Follow-up Plan . Follow-up with Claretta Fraise, MD as planned . Consider TDAP vaccine at your next visit with your PCP   I have personally reviewed and noted the following in the patient's chart:   . Medical and social history . Use of alcohol,  tobacco or illicit drugs  . Current medications and supplements . Functional ability and status . Nutritional status . Physical activity . Advanced directives . List of other physicians . Hospitalizations, surgeries, and ER visits in previous 12 months . Vitals . Screenings to include cognitive, depression, and falls . Referrals and appointments  In addition, I have reviewed and discussed with [name] certain preventive protocols, quality metrics, and best practice recommendations. A written personalized care plan for preventive services as well as general preventive health recommendations is available and can be mailed to the patient at his request.      Milas Hock, LPN  2/67/1245

## 2019-02-08 NOTE — Patient Instructions (Signed)

## 2019-04-10 DIAGNOSIS — M79676 Pain in unspecified toe(s): Secondary | ICD-10-CM | POA: Diagnosis not present

## 2019-04-10 DIAGNOSIS — B351 Tinea unguium: Secondary | ICD-10-CM | POA: Diagnosis not present

## 2019-04-10 DIAGNOSIS — I70203 Unspecified atherosclerosis of native arteries of extremities, bilateral legs: Secondary | ICD-10-CM | POA: Diagnosis not present

## 2019-04-10 DIAGNOSIS — L84 Corns and callosities: Secondary | ICD-10-CM | POA: Diagnosis not present

## 2019-04-30 DIAGNOSIS — H35033 Hypertensive retinopathy, bilateral: Secondary | ICD-10-CM | POA: Diagnosis not present

## 2019-04-30 DIAGNOSIS — H26491 Other secondary cataract, right eye: Secondary | ICD-10-CM | POA: Diagnosis not present

## 2019-04-30 DIAGNOSIS — H524 Presbyopia: Secondary | ICD-10-CM | POA: Diagnosis not present

## 2019-04-30 DIAGNOSIS — I10 Essential (primary) hypertension: Secondary | ICD-10-CM | POA: Diagnosis not present

## 2019-04-30 DIAGNOSIS — Z961 Presence of intraocular lens: Secondary | ICD-10-CM | POA: Diagnosis not present

## 2019-04-30 DIAGNOSIS — H3563 Retinal hemorrhage, bilateral: Secondary | ICD-10-CM | POA: Diagnosis not present

## 2019-04-30 DIAGNOSIS — H26492 Other secondary cataract, left eye: Secondary | ICD-10-CM | POA: Diagnosis not present

## 2019-05-03 ENCOUNTER — Other Ambulatory Visit: Payer: Self-pay | Admitting: Family Medicine

## 2019-05-09 ENCOUNTER — Ambulatory Visit: Payer: Medicare Other | Admitting: Family Medicine

## 2019-05-09 ENCOUNTER — Ambulatory Visit (INDEPENDENT_AMBULATORY_CARE_PROVIDER_SITE_OTHER): Payer: Medicare Other | Admitting: Family Medicine

## 2019-05-09 ENCOUNTER — Other Ambulatory Visit: Payer: Self-pay

## 2019-05-09 ENCOUNTER — Encounter: Payer: Self-pay | Admitting: Family Medicine

## 2019-05-09 VITALS — BP 155/66 | HR 71 | Temp 97.8°F | Resp 20 | Ht 71.0 in | Wt 239.5 lb

## 2019-05-09 DIAGNOSIS — I10 Essential (primary) hypertension: Secondary | ICD-10-CM

## 2019-05-09 DIAGNOSIS — L03116 Cellulitis of left lower limb: Secondary | ICD-10-CM

## 2019-05-09 DIAGNOSIS — E039 Hypothyroidism, unspecified: Secondary | ICD-10-CM | POA: Diagnosis not present

## 2019-05-09 DIAGNOSIS — I6930 Unspecified sequelae of cerebral infarction: Secondary | ICD-10-CM

## 2019-05-09 DIAGNOSIS — I6381 Other cerebral infarction due to occlusion or stenosis of small artery: Secondary | ICD-10-CM | POA: Diagnosis not present

## 2019-05-09 DIAGNOSIS — E782 Mixed hyperlipidemia: Secondary | ICD-10-CM

## 2019-05-09 MED ORDER — AMOXICILLIN-POT CLAVULANATE 875-125 MG PO TABS: 1.0000 | ORAL_TABLET | Freq: Two times a day (BID) | ORAL | 0 refills | Status: DC

## 2019-05-09 NOTE — Progress Notes (Signed)
Subjective:  Patient ID: [name], male    DOB: 04/20/1932  Age: 86 y.o. MRN: [id number]  CC: Medical Management of Chronic Issues   HPI [name] presents for  follow-up of hypertension. Patient has no history of headache chest pain or shortness of breath or recent cough. Patient also denies symptoms of TIA such as focal numbness or weakness. Patient denies side effects from medication. States taking it regularly.BP at home today was 116/54.  Has some swelling and hyperemia of the left lower extremity.  Mild to moderately painful/sore.  The redness seems to be growing over the last few days.  Swelling is chronic.   follow-up on  thyroid. The patient has a history of hypothyroidism for many years. It has been stable recently. Pt. denies any change in  voice, loss of hair, heat or cold intolerance. Energy level has been adequate to good. Patient denies constipation and diarrhea. No myxedema. Medication is as noted below. Verified that pt is taking it daily on an empty stomach. Well tolerated.    History [name] has a past medical history of Arthritis, BNC (bladder neck contracture), BPPV (benign paroxysmal positional vertigo), Cataract, Diverticulosis, H/O diarrhea (SECONDARY TO RADIATION THERAPY --  INTERMITANT  DIARRHEA), Hematuria, History of hemorrhagic cystitis, History of prostate cancer (CURRENTLY  ELEVATED PSA--  LUPRON INJECTIONS), Hyperlipidemia, Hypertension, Hypothyroidism, Radiation cystitis, Radiation proctitis, Urinary incontinence, Urinary retention, and Vitamin D deficiency.   He has a past surgical history that includes Prostatectomy (1997); Colonoscopy w/ polypectomy (2005; 08/11/2010); CYSTO/ FULGERATION OF BLEEDERS/ RESECTION BLADDER NECK CONTRACTURE (07-06-2004); Cataract extraction w/ intraocular lens implant (Right); Lumbar disc surgery (1996); Transurethral resection of bladder neck (N/A, 06/23/2012); Transurethral resection of bladder tumor with gyrus  (turbt-gyrus) (N/A, 06/01/2013); Eye surgery; Spine surgery; and Knee arthroscopy (Right).   His family history includes Alzheimer's disease in his brother; Appendicitis (age of onset: 96) in his sister; CVA (age of onset: 43) in his mother; Cancer in his sister and sister; Congestive Heart Failure in his mother; Early death in his sister; Hip fracture in his brother; Kidney disease in his brother and sister; Obesity in his sister; Pancreatitis in his father; Pneumonia in his father; Prostate cancer in his brother.He reports that he quit smoking about 56 years ago. His smoking use included cigarettes. He quit after 20.00 years of use. He quit smokeless tobacco use about 56 years ago.  His smokeless tobacco use included chew. He reports that he does not drink alcohol or use drugs.  Current Outpatient Medications on File Prior to Visit  Medication Sig Dispense Refill  . amLODipine (NORVASC) 10 MG tablet Take 1 tablet (10 mg total) by mouth daily. 90 tablet 3  . aspirin 325 MG tablet Take 1 tablet (325 mg total) by mouth daily.    Marland Kitchen atorvastatin (LIPITOR) 20 MG tablet 1 tablet daily 90 tablet 1  . Cholecalciferol (VITAMIN D3) 5000 UNITS CAPS Take 1 capsule by mouth daily.    . Elastic Bandages & Supports (V-2 HIGH COMPRESSION HOSE) MISC Wear daily when up walking around 1 each 0  . FLAXSEED, LINSEED, PO Take 30 mLs by mouth every morning. GRINDS FLAX SEEDS AND PUTS IN 1/4 CUP OF WATER    . GARLIC PO Take 2 capsules by mouth daily.    Marland Kitchen Leuprolide Acetate, 6 Month, (LUPRON DEPOT) 45 MG injection Inject 45 mg into the muscle every 6 (six) months.     . levothyroxine (SYNTHROID) 88 MCG tablet TAKE 1 TABLET BY MOUTH  ONCE DAILY BEFORE BREAKFAST.  Needs to be seen for further refills. 30 tablet 0  . lisinopril (ZESTRIL) 10 MG tablet Take 1 tablet (10 mg total) by mouth daily. Needs to be seen for further refills. 30 tablet 0  . Magnesium 300 MG CAPS Take 1 capsule by mouth daily.    . Multiple  Vitamins-Minerals (CENTRUM SILVER ULTRA MENS PO) Take 1 tablet by mouth daily.    . Probiotic Product (PROBIOTIC DAILY PO) Take 1 capsule by mouth daily.    . metoprolol succinate (TOPROL-XL) 50 MG 24 hr tablet Take 1 tablet (50 mg total) by mouth daily. For blood pressure control (Patient not taking: Reported on 05/09/2019) 90 tablet 1   No current facility-administered medications on file prior to visit.    ROS Review of Systems  Constitutional: Negative.   HENT: Negative.   Eyes: Negative for visual disturbance.  Respiratory: Negative for cough and shortness of breath.   Cardiovascular: Negative for chest pain and leg swelling.  Gastrointestinal: Negative for abdominal pain, diarrhea, nausea and vomiting.  Genitourinary: Negative for difficulty urinating.  Musculoskeletal: Negative for arthralgias and myalgias.  Skin: Positive for rash.  Neurological: Negative for headaches.  Psychiatric/Behavioral: Negative for sleep disturbance.    Objective:  BP (!) 155/66   Pulse 71   Temp 97.8 F (36.6 C) (Temporal)   Resp 20   Ht '5\' 11"'  (1.803 m)   Wt 239 lb 8 oz (108.6 kg)   SpO2 97%   BMI 33.40 kg/m   BP Readings from Last 3 Encounters:  05/09/19 (!) 155/66  11/07/18 (!) 150/58  04/05/18 (!) 161/68    Wt Readings from Last 3 Encounters:  05/09/19 239 lb 8 oz (108.6 kg)  11/07/18 241 lb 6.4 oz (109.5 kg)  04/05/18 246 lb (111.6 kg)     Physical Exam Constitutional:      General: He is not in acute distress.    Appearance: He is well-developed.  HENT:     Head: Normocephalic and atraumatic.     Right Ear: External ear normal.     Left Ear: External ear normal.     Nose: Nose normal.  Eyes:     Conjunctiva/sclera: Conjunctivae normal.     Pupils: Pupils are equal, round, and reactive to light.  Cardiovascular:     Rate and Rhythm: Normal rate and regular rhythm.     Heart sounds: Normal heart sounds. No murmur.  Pulmonary:     Effort: Pulmonary effort is normal.  No respiratory distress.     Breath sounds: Normal breath sounds. No wheezing or rales.  Abdominal:     Palpations: Abdomen is soft.     Tenderness: There is no abdominal tenderness.  Musculoskeletal:        General: Normal range of motion.     Cervical back: Normal range of motion and neck supple.     Left lower leg: Edema (2+) present.  Skin:    General: Skin is warm and dry.     Findings: Erythema (left shin, distal 1/3) present. No rash.  Neurological:     Mental Status: He is alert and oriented to person, place, and time.     Deep Tendon Reflexes: Reflexes are normal and symmetric.  Psychiatric:        Behavior: Behavior normal.        Thought Content: Thought content normal.        Judgment: Judgment normal.       Assessment & Plan:  [name] was seen today for medical management of chronic issues.  Diagnoses and all orders for this visit:  Essential hypertension -     CBC with Differential/Platelet -     CMP14+EGFR  Mixed hyperlipidemia -     Lipid panel -     CBC with Differential/Platelet -     CMP14+EGFR  Acquired hypothyroidism -     TSH + free T4 -     CBC with Differential/Platelet -     CMP14+EGFR  Multiple lacunar infarcts (HCC) -     CBC with Differential/Platelet -     CMP14+EGFR  Cellulitis of left lower extremity  Other orders -     amoxicillin-clavulanate (AUGMENTIN) 875-125 MG tablet; Take 1 tablet by mouth 2 (two) times daily. Take all of this medication   Allergies as of 05/09/2019      Reactions   Caffeine Palpitations   Codeine Palpitations      Medication List       Accurate as of May 09, 2019 11:59 PM. If you have any questions, ask your nurse or doctor.        amLODipine 10 MG tablet Commonly known as: NORVASC Take 1 tablet (10 mg total) by mouth daily.   amoxicillin-clavulanate 875-125 MG tablet Commonly known as: AUGMENTIN Take 1 tablet by mouth 2 (two) times daily. Take all of this medication Started by: Claretta Fraise, MD   aspirin 325 MG tablet Take 1 tablet (325 mg total) by mouth daily.   atorvastatin 20 MG tablet Commonly known as: LIPITOR 1 tablet daily   CENTRUM SILVER ULTRA MENS PO Take 1 tablet by mouth daily.   FLAXSEED (LINSEED) PO Take 30 mLs by mouth every morning. GRINDS FLAX SEEDS AND PUTS IN 1/4 CUP OF WATER   GARLIC PO Take 2 capsules by mouth daily.   levothyroxine 88 MCG tablet Commonly known as: SYNTHROID TAKE 1 TABLET BY MOUTH ONCE DAILY BEFORE BREAKFAST.  Needs to be seen for further refills.   lisinopril 10 MG tablet Commonly known as: ZESTRIL Take 1 tablet (10 mg total) by mouth daily. Needs to be seen for further refills.   Lupron Depot (58-Month) 45 MG injection Generic drug: Leuprolide Acetate (6 Month) Inject 45 mg into the muscle every 6 (six) months.   Magnesium 300 MG Caps Take 1 capsule by mouth daily.   metoprolol succinate 50 MG 24 hr tablet Commonly known as: TOPROL-XL Take 1 tablet (50 mg total) by mouth daily. For blood pressure control   PROBIOTIC DAILY PO Take 1 capsule by mouth daily.   V-2 High Compression Hose Misc Wear daily when up walking around   Vitamin D3 125 MCG (5000 UT) Caps Take 1 capsule by mouth daily.       Meds ordered this encounter  Medications  . amoxicillin-clavulanate (AUGMENTIN) 875-125 MG tablet    Sig: Take 1 tablet by mouth 2 (two) times daily. Take all of this medication    Dispense:  20 tablet    Refill:  0      Follow-up: Return in about 6 months (around 11/08/2019), or if symptoms worsen or fail to improve.  Claretta Fraise, M.D.

## 2019-05-09 NOTE — Patient Instructions (Signed)
Use Cetaphil for dry skin. Apply the lotion twice daily to cclean skin.

## 2019-05-10 LAB — CMP14+EGFR
ALT: 20 IU/L (ref 0–44)
AST: 22 IU/L (ref 0–40)
Albumin/Globulin Ratio: 2 (ref 1.2–2.2)
Albumin: 4.6 g/dL (ref 3.6–4.6)
Alkaline Phosphatase: 67 IU/L (ref 39–117)
BUN/Creatinine Ratio: 17 (ref 10–24)
BUN: 14 mg/dL (ref 8–27)
Bilirubin Total: 0.5 mg/dL (ref 0.0–1.2)
CO2: 23 mmol/L (ref 20–29)
Calcium: 9.9 mg/dL (ref 8.6–10.2)
Chloride: 104 mmol/L (ref 96–106)
Creatinine, Ser: 0.81 mg/dL (ref 0.76–1.27)
GFR calc Af Amer: 94 mL/min/{1.73_m2} (ref >59)
GFR calc non Af Amer: 82 mL/min/{1.73_m2} (ref >59)
Globulin, Total: 2.3 g/dL (ref 1.5–4.5)
Glucose: 89 mg/dL (ref 65–99)
Potassium: 4.7 mmol/L (ref 3.5–5.2)
Sodium: 140 mmol/L (ref 134–144)
Total Protein: 6.9 g/dL (ref 6.0–8.5)

## 2019-05-10 LAB — CBC WITH DIFFERENTIAL/PLATELET
Basophils Absolute: 0 10*3/uL (ref 0.0–0.2)
Basos: 1 %
EOS (ABSOLUTE): 0.2 10*3/uL (ref 0.0–0.4)
Eos: 4 %
Hematocrit: 45.2 % (ref 37.5–51.0)
Hemoglobin: 15.1 g/dL (ref 13.0–17.7)
Immature Grans (Abs): 0 10*3/uL (ref 0.0–0.1)
Immature Granulocytes: 0 %
Lymphocytes Absolute: 1.9 10*3/uL (ref 0.7–3.1)
Lymphs: 33 %
MCH: 31.6 pg (ref 26.6–33.0)
MCHC: 33.4 g/dL (ref 31.5–35.7)
MCV: 95 fL (ref 79–97)
Monocytes Absolute: 0.5 10*3/uL (ref 0.1–0.9)
Monocytes: 9 %
Neutrophils Absolute: 3.2 10*3/uL (ref 1.4–7.0)
Neutrophils: 53 %
Platelets: 226 10*3/uL (ref 150–450)
RBC: 4.78 x10E6/uL (ref 4.14–5.80)
RDW: 12.6 % (ref 11.6–15.4)
WBC: 5.9 10*3/uL (ref 3.4–10.8)

## 2019-05-10 LAB — LIPID PANEL
Chol/HDL Ratio: 2.2 ratio (ref 0.0–5.0)
Cholesterol, Total: 129 mg/dL (ref 100–199)
HDL: 60 mg/dL (ref >39)
LDL Chol Calc (NIH): 48 mg/dL (ref 0–99)
Triglycerides: 121 mg/dL (ref 0–149)
VLDL Cholesterol Cal: 21 mg/dL (ref 5–40)

## 2019-05-10 LAB — TSH+FREE T4
Free T4: 1.59 ng/dL (ref 0.82–1.77)
TSH: 1.69 u[IU]/mL (ref 0.450–4.500)

## 2019-05-10 NOTE — Progress Notes (Signed)
Hello  Donovon,    Your lab result is normal and/or stable.Some minor variations that are not significant are commonly marked abnormal, but do not represent any medical problem for you.   Best regards,  Claretta Fraise, M.D.

## 2019-05-12 ENCOUNTER — Encounter: Payer: Self-pay | Admitting: Family Medicine

## 2019-05-15 ENCOUNTER — Telehealth: Payer: Self-pay | Admitting: Family Medicine

## 2019-05-15 NOTE — Chronic Care Management (AMB) (Signed)
  Chronic Care Management   Note  05/15/2019 Name: [name] MRN: [id number] DOB: Nov 03, 1932  [name] is a 86 y.o. year old male who is a primary care patient of Stacks, Cletus Gash, MD. I reached out to [name] by phone today in response to a referral sent by [name]'s health plan.     [name] was given information about Chronic Care Management services today including:  1. CCM service includes personalized support from designated clinical staff supervised by his physician, including individualized plan of care and coordination with other care providers 2. 24/7 contact phone numbers for assistance for urgent and routine care needs. 3. Service will only be billed when office clinical staff spend 20 minutes or more in a month to coordinate care. 4. Only one practitioner may furnish and bill the service in a calendar month. 5. The patient may stop CCM services at any time (effective at the end of the month) by phone call to the office staff. 6. The patient will be responsible for cost sharing (co-pay) of up to 20% of the service fee (after annual deductible is met).  Patient did not agree to enrollment in care management services and does not wish to consider at this time.  Follow up plan: The patient has been provided with contact information for the care management team and has been advised to call with any health related questions or concerns.   [place], [place] [address] Direct Dial: [phone] [email or web address] Website: [email or web address]

## 2019-06-04 ENCOUNTER — Other Ambulatory Visit: Payer: Self-pay | Admitting: Family Medicine

## 2019-06-05 ENCOUNTER — Other Ambulatory Visit: Payer: Self-pay | Admitting: Family Medicine

## 2019-06-06 DIAGNOSIS — M1711 Unilateral primary osteoarthritis, right knee: Secondary | ICD-10-CM | POA: Diagnosis not present

## 2019-07-10 DIAGNOSIS — I70203 Unspecified atherosclerosis of native arteries of extremities, bilateral legs: Secondary | ICD-10-CM | POA: Diagnosis not present

## 2019-07-10 DIAGNOSIS — L84 Corns and callosities: Secondary | ICD-10-CM | POA: Diagnosis not present

## 2019-07-10 DIAGNOSIS — B351 Tinea unguium: Secondary | ICD-10-CM | POA: Diagnosis not present

## 2019-07-10 DIAGNOSIS — M79676 Pain in unspecified toe(s): Secondary | ICD-10-CM | POA: Diagnosis not present

## 2019-07-26 DIAGNOSIS — C61 Malignant neoplasm of prostate: Secondary | ICD-10-CM | POA: Diagnosis not present

## 2019-08-02 DIAGNOSIS — R3121 Asymptomatic microscopic hematuria: Secondary | ICD-10-CM | POA: Diagnosis not present

## 2019-08-02 DIAGNOSIS — Z192 Hormone resistant malignancy status: Secondary | ICD-10-CM | POA: Diagnosis not present

## 2019-08-03 ENCOUNTER — Other Ambulatory Visit: Payer: Self-pay | Admitting: Urology

## 2019-08-03 ENCOUNTER — Other Ambulatory Visit (HOSPITAL_COMMUNITY): Payer: Self-pay | Admitting: Urology

## 2019-08-03 DIAGNOSIS — C61 Malignant neoplasm of prostate: Secondary | ICD-10-CM

## 2019-08-06 ENCOUNTER — Other Ambulatory Visit: Payer: Self-pay | Admitting: Family Medicine

## 2019-08-20 ENCOUNTER — Ambulatory Visit (HOSPITAL_COMMUNITY)
Admission: RE | Admit: 2019-08-20 | Discharge: 2019-08-20 | Disposition: A | Discharge status: Home / Self Care | Payer: Medicare Other | Source: Ambulatory Visit | Attending: Urology | Admitting: Urology

## 2019-08-20 ENCOUNTER — Other Ambulatory Visit: Payer: Self-pay

## 2019-08-20 DIAGNOSIS — Z192 Hormone resistant malignancy status: Secondary | ICD-10-CM | POA: Insufficient documentation

## 2019-08-20 DIAGNOSIS — M170 Bilateral primary osteoarthritis of knee: Secondary | ICD-10-CM | POA: Diagnosis not present

## 2019-08-20 DIAGNOSIS — C61 Malignant neoplasm of prostate: Secondary | ICD-10-CM | POA: Insufficient documentation

## 2019-08-20 MED ORDER — TECHNETIUM TC 99M MEDRONATE IV KIT
21.6000 | PACK | Freq: Once | INTRAVENOUS | Status: AC | PRN
  Administered 2019-08-20: 21.6 via INTRAVENOUS

## 2019-10-03 DIAGNOSIS — M25561 Pain in right knee: Secondary | ICD-10-CM | POA: Diagnosis not present

## 2019-10-10 DIAGNOSIS — L570 Actinic keratosis: Secondary | ICD-10-CM | POA: Diagnosis not present

## 2019-10-10 DIAGNOSIS — I781 Nevus, non-neoplastic: Secondary | ICD-10-CM | POA: Diagnosis not present

## 2019-10-10 DIAGNOSIS — D1801 Hemangioma of skin and subcutaneous tissue: Secondary | ICD-10-CM | POA: Diagnosis not present

## 2019-10-10 DIAGNOSIS — L821 Other seborrheic keratosis: Secondary | ICD-10-CM | POA: Diagnosis not present

## 2019-10-16 DIAGNOSIS — L84 Corns and callosities: Secondary | ICD-10-CM | POA: Diagnosis not present

## 2019-10-16 DIAGNOSIS — B351 Tinea unguium: Secondary | ICD-10-CM | POA: Diagnosis not present

## 2019-10-16 DIAGNOSIS — M79676 Pain in unspecified toe(s): Secondary | ICD-10-CM | POA: Diagnosis not present

## 2019-10-16 DIAGNOSIS — I70203 Unspecified atherosclerosis of native arteries of extremities, bilateral legs: Secondary | ICD-10-CM | POA: Diagnosis not present

## 2019-11-08 ENCOUNTER — Encounter: Payer: Self-pay | Admitting: Family Medicine

## 2019-11-08 ENCOUNTER — Other Ambulatory Visit: Payer: Self-pay

## 2019-11-08 ENCOUNTER — Ambulatory Visit (INDEPENDENT_AMBULATORY_CARE_PROVIDER_SITE_OTHER): Payer: Medicare Other | Admitting: Family Medicine

## 2019-11-08 VITALS — BP 138/68 | HR 68 | Temp 98.1°F | Ht 71.0 in | Wt 237.8 lb

## 2019-11-08 DIAGNOSIS — I10 Essential (primary) hypertension: Secondary | ICD-10-CM

## 2019-11-08 DIAGNOSIS — E782 Mixed hyperlipidemia: Secondary | ICD-10-CM | POA: Diagnosis not present

## 2019-11-08 DIAGNOSIS — E559 Vitamin D deficiency, unspecified: Secondary | ICD-10-CM

## 2019-11-08 DIAGNOSIS — E039 Hypothyroidism, unspecified: Secondary | ICD-10-CM

## 2019-11-08 LAB — LIPID PANEL

## 2019-11-08 NOTE — Progress Notes (Signed)
 Subjective:  Patient ID: [name], male    DOB: 09/27/1932  Age: 86 y.o. MRN: [id number]  CC: Follow-up (6 month)   HPI [name] presents for  follow-up of hypertension. Patient has no history of headache chest pain or shortness of breath or recent cough. Patient also denies symptoms of TIA such as focal numbness or weakness. Patient denies side effects from medication. States taking it regularly.  Patient presents for follow-up on  thyroid. The patient has a history of hypothyroidism for many years. It has been stable recently. Pt. denies any change in  voice, loss of hair, heat or cold intolerance. Energy level has been adequate to good. Patient denies constipation and diarrhea. No myxedema. Medication is as noted below. Verified that pt is taking it daily on an empty stomach. Well tolerated.  Patient in for follow-up of elevated cholesterol. Doing well without complaints on current medication. Denies side effects of statin including myalgia and arthralgia and nausea. Also in today for liver function testing. Currently no chest pain, shortness of breath or other cardiovascular related symptoms noted.  Patient is having some pain with the right knee.  This seems to be responding  Well to using a copper infused knee sleeve. History [name] has a past medical history of Arthritis, BNC (bladder neck contracture), BPPV (benign paroxysmal positional vertigo), Cataract, Diverticulosis, H/O diarrhea (SECONDARY TO RADIATION THERAPY --  INTERMITANT  DIARRHEA), Hematuria, History of hemorrhagic cystitis, History of prostate cancer (CURRENTLY  ELEVATED PSA--  LUPRON INJECTIONS), Hyperlipidemia, Hypertension, Hypothyroidism, Radiation cystitis, Radiation proctitis, Urinary incontinence, Urinary retention, and Vitamin D deficiency.   He has a past surgical history that includes Prostatectomy (1997); Colonoscopy w/ polypectomy (2005; 08/11/2010); CYSTO/ FULGERATION OF BLEEDERS/ RESECTION BLADDER  NECK CONTRACTURE (07-06-2004); Cataract extraction w/ intraocular lens implant (Right); Lumbar disc surgery (1996); Transurethral resection of bladder neck (N/A, 06/23/2012); Transurethral resection of bladder tumor with gyrus (turbt-gyrus) (N/A, 06/01/2013); Eye surgery; Spine surgery; and Knee arthroscopy (Right).   His family history includes Alzheimer's disease in his brother; Appendicitis (age of onset: 20) in his sister; CVA (age of onset: 93) in his mother; Cancer in his sister and sister; Congestive Heart Failure in his mother; Early death in his sister; Hip fracture in his brother; Kidney disease in his brother and sister; Obesity in his sister; Pancreatitis in his father; Pneumonia in his father; Prostate cancer in his brother.He reports that he quit smoking about 56 years ago. His smoking use included cigarettes. He quit after 20.00 years of use. He quit smokeless tobacco use about 56 years ago.  His smokeless tobacco use included chew. He reports that he does not drink alcohol and does not use drugs.  Current Outpatient Medications on File Prior to Visit  Medication Sig Dispense Refill  . amLODipine (NORVASC) 10 MG tablet Take 1 tablet (10 mg total) by mouth daily. 90 tablet 3  . aspirin 325 MG tablet Take 1 tablet (325 mg total) by mouth daily.    . atorvastatin (LIPITOR) 20 MG tablet Take 1 tablet by mouth once daily 90 tablet 0  . Cholecalciferol (VITAMIN D3) 5000 UNITS CAPS Take 1 capsule by mouth daily.    . Elastic Bandages & Supports (V-2 HIGH COMPRESSION HOSE) MISC Wear daily when up walking around 1 each 0  . FLAXSEED, LINSEED, PO Take 30 mLs by mouth every morning. GRINDS FLAX SEEDS AND PUTS IN 1/4 CUP OF WATER    . levothyroxine (EUTHYROX) 88 MCG tablet Take 1 tablet (88   mcg total) by mouth daily before breakfast. 90 tablet 3  . lisinopril (ZESTRIL) 10 MG tablet Take 1 tablet (10 mg total) by mouth daily. 90 tablet 1  . Magnesium 300 MG CAPS Take 1 capsule by mouth daily.    .  metoprolol succinate (TOPROL-XL) 50 MG 24 hr tablet Take 1 tablet (50 mg total) by mouth daily. For blood pressure control 90 tablet 1  . Multiple Vitamins-Minerals (CENTRUM SILVER ULTRA MENS PO) Take 1 tablet by mouth daily.    . Probiotic Product (PROBIOTIC DAILY PO) Take 1 capsule by mouth daily.    . GARLIC PO Take 2 capsules by mouth daily. (Patient not taking: Reported on 11/08/2019)    . Leuprolide Acetate, 6 Month, (LUPRON DEPOT) 45 MG injection Inject 45 mg into the muscle every 6 (six) months.  (Patient not taking: Reported on 11/08/2019)     No current facility-administered medications on file prior to visit.    ROS Review of Systems  Constitutional: Negative for fever.  Respiratory: Negative for shortness of breath.   Cardiovascular: Negative for chest pain.  Musculoskeletal: Positive for arthralgias (knee).  Skin: Negative for rash.    Objective:  BP 138/68   Pulse 68   Temp 98.1 F (36.7 C) (Temporal)   Ht 5' 11" (1.803 m)   Wt 237 lb 12.8 oz (107.9 kg)   BMI 33.17 kg/m   BP Readings from Last 3 Encounters:  11/08/19 138/68  05/09/19 (!) 155/66  11/07/18 (!) 150/58    Wt Readings from Last 3 Encounters:  11/08/19 237 lb 12.8 oz (107.9 kg)  05/09/19 239 lb 8 oz (108.6 kg)  11/07/18 241 lb 6.4 oz (109.5 kg)     Physical Exam Vitals reviewed.  Constitutional:      Appearance: He is well-developed.  HENT:     Head: Normocephalic and atraumatic.     Right Ear: Tympanic membrane and external ear normal. No decreased hearing noted.     Left Ear: Tympanic membrane and external ear normal. No decreased hearing noted.     Mouth/Throat:     Pharynx: No oropharyngeal exudate or posterior oropharyngeal erythema.  Eyes:     Pupils: Pupils are equal, round, and reactive to light.  Cardiovascular:     Rate and Rhythm: Normal rate and regular rhythm.     Heart sounds: No murmur heard.   Pulmonary:     Effort: No respiratory distress.     Breath sounds: Normal  breath sounds.  Abdominal:     General: Bowel sounds are normal.     Palpations: Abdomen is soft. There is no mass.     Tenderness: There is no abdominal tenderness.  Musculoskeletal:     Cervical back: Normal range of motion and neck supple.       Assessment & Plan:   Djibril was seen today for follow-up.  Diagnoses and all orders for this visit:  Primary hypertension -     CBC with Differential/Platelet -     CMP14+EGFR -     Lipid panel -     VITAMIN D 25 Hydroxy (Vit-D Deficiency, Fractures) -     TSH + free T4  Mixed hyperlipidemia -     CBC with Differential/Platelet -     CMP14+EGFR -     Lipid panel  Acquired hypothyroidism -     CBC with Differential/Platelet -     CMP14+EGFR -     TSH + free T4  Vitamin D deficiency -       CBC with Differential/Platelet -     CMP14+EGFR -     VITAMIN D 25 Hydroxy (Vit-D Deficiency, Fractures)   Allergies as of 11/08/2019      Reactions   Caffeine Palpitations   Codeine Palpitations      Medication List       Accurate as of November 08, 2019 11:59 PM. If you have any questions, ask your nurse or doctor.        STOP taking these medications   amoxicillin-clavulanate 875-125 MG tablet Commonly known as: AUGMENTIN Stopped by: Claretta Fraise, MD     TAKE these medications   amLODipine 10 MG tablet Commonly known as: NORVASC Take 1 tablet (10 mg total) by mouth daily.   aspirin 325 MG tablet Take 1 tablet (325 mg total) by mouth daily.   atorvastatin 20 MG tablet Commonly known as: LIPITOR Take 1 tablet by mouth once daily   CENTRUM SILVER ULTRA MENS PO Take 1 tablet by mouth daily.   FLAXSEED (LINSEED) PO Take 30 mLs by mouth every morning. GRINDS FLAX SEEDS AND PUTS IN 1/4 CUP OF WATER   GARLIC PO Take 2 capsules by mouth daily.   levothyroxine 88 MCG tablet Commonly known as: Euthyrox Take 1 tablet (88 mcg total) by mouth daily before breakfast.   lisinopril 10 MG tablet Commonly known as:  ZESTRIL Take 1 tablet (10 mg total) by mouth daily.   Lupron Depot (63-Month) 45 MG injection Generic drug: Leuprolide Acetate (6 Month) Inject 45 mg into the muscle every 6 (six) months.   Magnesium 300 MG Caps Take 1 capsule by mouth daily.   metoprolol succinate 50 MG 24 hr tablet Commonly known as: TOPROL-XL Take 1 tablet (50 mg total) by mouth daily. For blood pressure control   PROBIOTIC DAILY PO Take 1 capsule by mouth daily.   V-2 High Compression Hose Misc Wear daily when up walking around   Vitamin D3 125 MCG (5000 UT) Caps Take 1 capsule by mouth daily.       No orders of the defined types were placed in this encounter.     Follow-up: Return in about 6 months (around 05/08/2020).  Claretta Fraise, M.D.

## 2019-11-09 LAB — CBC WITH DIFFERENTIAL/PLATELET
Basophils Absolute: 0 10*3/uL (ref 0.0–0.2)
Basos: 1 %
EOS (ABSOLUTE): 0.2 10*3/uL (ref 0.0–0.4)
Eos: 3 %
Hematocrit: 43.9 % (ref 37.5–51.0)
Hemoglobin: 14.7 g/dL (ref 13.0–17.7)
Immature Grans (Abs): 0 10*3/uL (ref 0.0–0.1)
Immature Granulocytes: 0 %
Lymphocytes Absolute: 1.6 10*3/uL (ref 0.7–3.1)
Lymphs: 26 %
MCH: 31.4 pg (ref 26.6–33.0)
MCHC: 33.5 g/dL (ref 31.5–35.7)
MCV: 94 fL (ref 79–97)
Monocytes Absolute: 0.6 10*3/uL (ref 0.1–0.9)
Monocytes: 10 %
Neutrophils Absolute: 3.6 10*3/uL (ref 1.4–7.0)
Neutrophils: 60 %
Platelets: 218 10*3/uL (ref 150–450)
RBC: 4.68 x10E6/uL (ref 4.14–5.80)
RDW: 13.2 % (ref 11.6–15.4)
WBC: 6 10*3/uL (ref 3.4–10.8)

## 2019-11-09 LAB — CMP14+EGFR
ALT: 18 IU/L (ref 0–44)
AST: 22 IU/L (ref 0–40)
Albumin/Globulin Ratio: 2 (ref 1.2–2.2)
Albumin: 4.3 g/dL (ref 3.6–4.6)
Alkaline Phosphatase: 67 IU/L (ref 44–121)
BUN/Creatinine Ratio: 16 (ref 10–24)
BUN: 13 mg/dL (ref 8–27)
Bilirubin Total: 0.5 mg/dL (ref 0.0–1.2)
CO2: 23 mmol/L (ref 20–29)
Calcium: 9.6 mg/dL (ref 8.6–10.2)
Chloride: 100 mmol/L (ref 96–106)
Creatinine, Ser: 0.79 mg/dL (ref 0.76–1.27)
GFR calc Af Amer: 95 mL/min/{1.73_m2} (ref >59)
GFR calc non Af Amer: 82 mL/min/{1.73_m2} (ref >59)
Globulin, Total: 2.1 g/dL (ref 1.5–4.5)
Glucose: 95 mg/dL (ref 65–99)
Potassium: 4.7 mmol/L (ref 3.5–5.2)
Sodium: 136 mmol/L (ref 134–144)
Total Protein: 6.4 g/dL (ref 6.0–8.5)

## 2019-11-09 LAB — LIPID PANEL
Chol/HDL Ratio: 2 ratio (ref 0.0–5.0)
Cholesterol, Total: 125 mg/dL (ref 100–199)
HDL: 62 mg/dL (ref >39)
LDL Chol Calc (NIH): 45 mg/dL (ref 0–99)
Triglycerides: 94 mg/dL (ref 0–149)
VLDL Cholesterol Cal: 18 mg/dL (ref 5–40)

## 2019-11-09 LAB — TSH+FREE T4
Free T4: 1.74 ng/dL (ref 0.82–1.77)
TSH: 1.51 u[IU]/mL (ref 0.450–4.500)

## 2019-11-09 LAB — VITAMIN D 25 HYDROXY (VIT D DEFICIENCY, FRACTURES): Vit D, 25-Hydroxy: 64.1 ng/mL (ref 30.0–100.0)

## 2019-11-09 NOTE — Progress Notes (Signed)
Hello [name],  Your lab result is normal and/or stable.Some minor variations that are not significant are commonly marked abnormal, but do not represent any medical problem for you.  Best regards, Claretta Fraise, M.D.

## 2019-11-10 ENCOUNTER — Other Ambulatory Visit: Payer: Self-pay | Admitting: Family Medicine

## 2019-11-10 DIAGNOSIS — I10 Essential (primary) hypertension: Secondary | ICD-10-CM

## 2019-11-11 ENCOUNTER — Encounter: Payer: Self-pay | Admitting: Family Medicine

## 2019-12-06 ENCOUNTER — Other Ambulatory Visit: Payer: Self-pay | Admitting: Family Medicine

## 2020-01-01 DIAGNOSIS — M25561 Pain in right knee: Secondary | ICD-10-CM | POA: Diagnosis not present

## 2020-01-15 DIAGNOSIS — M79676 Pain in unspecified toe(s): Secondary | ICD-10-CM | POA: Diagnosis not present

## 2020-01-15 DIAGNOSIS — B351 Tinea unguium: Secondary | ICD-10-CM | POA: Diagnosis not present

## 2020-01-15 DIAGNOSIS — L84 Corns and callosities: Secondary | ICD-10-CM | POA: Diagnosis not present

## 2020-01-15 DIAGNOSIS — I70203 Unspecified atherosclerosis of native arteries of extremities, bilateral legs: Secondary | ICD-10-CM | POA: Diagnosis not present

## 2020-01-30 DIAGNOSIS — C61 Malignant neoplasm of prostate: Secondary | ICD-10-CM | POA: Diagnosis not present

## 2020-01-30 DIAGNOSIS — Z192 Hormone resistant malignancy status: Secondary | ICD-10-CM | POA: Diagnosis not present

## 2020-02-04 DIAGNOSIS — Z5111 Encounter for antineoplastic chemotherapy: Secondary | ICD-10-CM | POA: Diagnosis not present

## 2020-02-04 DIAGNOSIS — C61 Malignant neoplasm of prostate: Secondary | ICD-10-CM | POA: Diagnosis not present

## 2020-02-06 ENCOUNTER — Encounter: Payer: Self-pay | Admitting: Physical Therapy

## 2020-02-06 ENCOUNTER — Other Ambulatory Visit: Payer: Self-pay

## 2020-02-06 ENCOUNTER — Ambulatory Visit: Payer: Medicare Other | Attending: Orthopedic Surgery | Admitting: Physical Therapy

## 2020-02-06 DIAGNOSIS — M25561 Pain in right knee: Secondary | ICD-10-CM | POA: Diagnosis not present

## 2020-02-06 DIAGNOSIS — R2681 Unsteadiness on feet: Secondary | ICD-10-CM | POA: Insufficient documentation

## 2020-02-06 DIAGNOSIS — M6281 Muscle weakness (generalized): Secondary | ICD-10-CM | POA: Insufficient documentation

## 2020-02-06 DIAGNOSIS — G8929 Other chronic pain: Secondary | ICD-10-CM | POA: Diagnosis not present

## 2020-02-06 NOTE — Therapy (Signed)
[facility] [place], [place], [address] Phone: [phone]   Fax:  [phone]  Physical Therapy Treatment  Patient Details  Name: [name] MRN: [id number] Date of Birth: Nov 20, 1932 Referring Provider (PT): Netta Cedars, MD   Encounter Date: 02/06/2020   PT End of Session - 02/06/20 0940    Visit Number 1    Number of Visits 8    Date for PT Re-Evaluation 03/12/20    Authorization Type Medicare (CQ and KX modifier), BCBS, Progress note every 10th visit    PT Start Time 0900    PT Stop Time 0945    PT Time Calculation (min) 45 min    Equipment Utilized During Treatment --   right knee brace   Activity Tolerance Patient tolerated treatment well    Behavior During Therapy [facility] for tasks assessed/performed           Past Medical History:  Diagnosis Date  . Arthritis   . BNC (bladder neck contracture)   . BPPV (benign paroxysmal positional vertigo)   . Cataract    bilateral  . Diverticulosis   . H/O diarrhea SECONDARY TO RADIATION THERAPY --  INTERMITANT  DIARRHEA  . Hematuria   . History of hemorrhagic cystitis    SECONDARY RADIATION THERAPY 1997  . History of prostate cancer CURRENTLY  ELEVATED PSA--  LUPRON INJECTIONS   S/P PROSTATECTOMY AND RADIATION THERAPY  1997  . Hyperlipidemia   . Hypertension   . Hypothyroidism   . Radiation cystitis   . Radiation proctitis   . Urinary incontinence   . Urinary retention   . Vitamin D deficiency     Past Surgical History:  Procedure Laterality Date  . CATARACT EXTRACTION W/ INTRAOCULAR LENS IMPLANT Right   . COLONOSCOPY W/ POLYPECTOMY  2005; 08/11/2010   2005: 1 cm hyperplastic sigmoid polyp, Dr. Wynetta Emery 2012: hyperplastic splenic flexure polyp -1 cm, diverticulosis, radiation proctitis, anal stenosis  . CYSTO/ FULGERATION OF BLEEDERS/ RESECTION BLADDER NECK CONTRACTURE  07-06-2004   BNC AND RADIATION CYSTITIS  . EYE SURGERY    . KNEE ARTHROSCOPY Right   .  LUMBAR DISC SURGERY  1996   L4 -- L5  . PROSTATECTOMY  1997  . SPINE SURGERY    . TRANSURETHRAL RESECTION OF BLADDER NECK N/A 06/23/2012   Procedure: TRANSURETHRAL RESECTION OF BLADDER NECK CONTRACTURE WITH GYRUS;  Surgeon: Claybon Jabs, MD;  Location: [facility];  Service: Urology;  Laterality: N/A;  . TRANSURETHRAL RESECTION OF BLADDER TUMOR WITH GYRUS (TURBT-GYRUS) N/A 06/01/2013   Procedure: TRANSURETHRAL RESECTION OF BLADDER NECK CONTRACTURE WITH GYRUS (TURBT-GYRUS) AND INJECTION OF KENALOG;  Surgeon: Claybon Jabs, MD;  Location: [facility];  Service: Urology;  Laterality: N/A;    There were no vitals filed for this visit.   Subjective Assessment - 02/06/20 1059    Subjective COVID-19 screening performed upon arrival. Patient arrives to physical therapy with reports of right knee pain, decreased bilateral LE strength, and difficulty walking that has progressed over time. Patient has not had any falls recently but states his knee gives way and buckles but he is able to stay standing. Patient reports walking with a cane and a walker for safety. Patient's reports moderate pain in right knee at worst and mild pain at bbest. Patient's goals are to decrease pain, improve movement, and improve strength.    Patient is accompained by: Family member    Pertinent History HTN, history of CVA  Limitations Walking;House hold activities    Diagnostic tests x-ray, CT    Patient Stated Goals improve movement    Currently in Pain? No/denies              [facility] PT Assessment - 02/06/20 0001      Assessment   Medical Diagnosis Pain in right knee    Referring Provider (PT) Netta Cedars, MD    Onset Date/Surgical Date --   chronic   Next MD Visit n/a    Prior Therapy yes      Precautions   Precautions Fall      Restrictions   Weight Bearing Restrictions No      Balance Screen   Has the patient fallen in the past 6 months No    Has the patient had a decrease  in activity level because of a fear of falling?  No    Is the patient reluctant to leave their home because of a fear of falling?  No      Home Environment   Living Environment Private residence    Living Arrangements Spouse/significant other    Type of [place] to enter    Entrance Stairs-Number of Steps 2    Entrance Stairs-Rails Left      Prior Function   Level of Independence Independent with community mobility with device;Independent with basic ADLs      Observation/Other Assessments   Observations right knee brace with patella orifice donned      ROM / Strength   AROM / PROM / Strength AROM;PROM;Strength      AROM   Overall AROM  Within functional limits for tasks performed    Overall AROM Comments 4-121    AROM Assessment Site Knee    Right/Left Knee Right    Right Knee Extension 4    Right Knee Flexion 121      PROM   Overall PROM  Within functional limits for tasks performed    PROM Assessment Site Knee    Right/Left Knee Right    Right Knee Extension 2    Right Knee Flexion 121      Strength   Strength Assessment Site Hip;Knee    Right/Left Hip Right;Left    Right Hip Flexion 3+/5    Right Hip ABduction 3/5    Right Hip ADduction 3/5    Left Hip Flexion 3-/5    Left Hip ABduction 3/5    Left Hip ADduction 3/5    Right/Left Knee Right;Left    Right Knee Flexion 4/5    Right Knee Extension 4/5    Left Knee Flexion 4/5    Left Knee Extension 4/5      Palpation   Patella mobility decreased patella mobilization in all planes    Palpation comment minimal tenderness to right knee and surrounding musculature.      Transfers   Five time sit to stand comments  35 seconds with UE support in standard chair      Standardized Balance Assessment   Standardized Balance Assessment Berg Balance Test      Berg Balance Test   Sit to Stand Able to stand using hands after several tries    Sitting with Back Unsupported but Feet Supported on  Floor or Stool Able to sit safely and securely 2 minutes    Stand to Sit Uses backs of legs against chair to control descent    Transfers Able to transfer with verbal cueing  and /or supervision    Berg comment: to be completed next visit                         [facility] Adult PT Treatment/Exercise - 02/06/20 0001      Ambulation/Gait   Assistive device Straight cane    Gait Pattern Step-through pattern;Decreased stance time - right;Decreased step length - left;Decreased stride length;Decreased weight shift to right;Decreased hip/knee flexion - right;Right flexed knee in stance;Trendelenburg   increased dynamic knee valgus during R stance     Exercises   Exercises Knee/Hip      Knee/Hip Exercises: Aerobic   Nustep level 3 x10  mins                  PT Education - 02/06/20 0959    Education Details bridging, SLR, clamshells, pillow squeeze    Person(s) Educated Patient    Methods Explanation;Handout    Comprehension Verbalized understanding;Returned demonstration               PT Long Term Goals - 02/06/20 1122      PT LONG TERM GOAL #1   Title Patient will be independent with HEP and its progression.    Time 4    Period Weeks    Status New      PT LONG TERM GOAL #2   Title Patient will report ability to perform ADLs with right pain less than or equal to 3/10.    Baseline ---    Time 4    Period Weeks    Status New      PT LONG TERM GOAL #3   Title Patient will demonstrate 4/5  or greater bilateral LE MMT to improve stability during functional tasks.    Baseline --    Time 4    Period Weeks      PT LONG TERM GOAL #4   Title Patient will perform modified 5x sit to stand test in 20 seconds or less with UE support to improve balance and improve functional LE strength.    Time 4    Period Weeks    Status New      PT LONG TERM GOAL #5   Title Patient will demonstrate a 5+ point improvement on Berg Balance Scale to decrease risk of falls.     Time 4    Period Weeks    Status New                 Plan - 02/06/20 1115    Clinical Impression Statement Patient is an 87 year old male who presents to phyiscal therapy with right knee pain, decreased bilateral LE MMT, and decreased balance that has progressed over the past several years. Patient's modified 5x sit to stand with UE support time of 35 seconds categorizes him as a fall risk. Patient denies any tenderness to palpation surrounding R knee. Patient ambulates with a SPC close to the wall with decreased stride lengths, decreased right knee flexion during swing, and dynamic R knee valgus during stance phase. Patient and PT discussed plan of care and discussed HEP and how to don knee brace properly. Patient and patient's wife reported understanding. Patient would benefit from skilled physical therapy to address deficits and address patient's goals.    Personal Factors and Comorbidities Age;Comorbidity 2    Comorbidities HTN, history of CVA    Examination-Activity Limitations Locomotion Level;Transfers;Stairs;Stand;Hygiene/Grooming;Bathing    Stability/Clinical Decision Making Evolving/Moderate complexity  Clinical Decision Making Moderate    Rehab Potential Good    PT Frequency 2x / week    PT Duration 4 weeks    PT Treatment/Interventions ADLs/Self Care Home Management;Cryotherapy;Electrical Stimulation;Gait training;Stair training;Functional mobility training;Therapeutic activities;Therapeutic exercise;Moist Heat;Balance training;Neuromuscular re-education;Passive range of motion;Patient/family education;Vasopneumatic Device    PT Next Visit Plan complete berg balance scale, nustep, LE strengthening and balance, review donning and doffing knee brace    PT Home Exercise Plan see patient education section    Consulted and Agree with Plan of Care Patient           Patient will benefit from skilled therapeutic intervention in order to improve the following deficits and  impairments:  Abnormal gait,Decreased range of motion,Decreased activity tolerance,Decreased balance,Decreased strength,Pain  Visit Diagnosis: Chronic pain of right knee  Muscle weakness (generalized)  Unsteadiness on feet     Problem List Patient Active Problem List   Diagnosis Date Noted  . Multiple lacunar infarcts ([place]) 09/30/2016  . Aphasia   . TIA (transient ischemic attack) 04/03/2016  . Metabolic syndrome 75/44/9201  . Osteopenia of neck of left femur 01/15/2016  . H/O prostate cancer 06/27/2015  . Obesity (BMI 30.0-34.9) 12/27/2014  . Hypertension 07/04/2012  . Hyperlipidemia 07/04/2012  . Hypothyroidism 07/04/2012  . Bladder neck contracture 06/22/2012  . Radiation proctitis 08/11/2010  . Anal stenosis 08/11/2010  . Personal history of colonic polyps 08/11/2010    Gabriela Eves, PT, DPT 02/06/2020, 12:31 PM  [facility] [address] [place], [place], [address] Phone: [phone]   Fax:  [phone]  Name: [name] MRN: [id number] Date of Birth: [birth year]

## 2020-02-07 ENCOUNTER — Ambulatory Visit: Payer: Medicare Other | Admitting: Physical Therapy

## 2020-02-07 ENCOUNTER — Encounter: Payer: Self-pay | Admitting: Physical Therapy

## 2020-02-07 ENCOUNTER — Other Ambulatory Visit: Payer: Self-pay

## 2020-02-07 DIAGNOSIS — M6281 Muscle weakness (generalized): Secondary | ICD-10-CM | POA: Diagnosis not present

## 2020-02-07 DIAGNOSIS — M25561 Pain in right knee: Secondary | ICD-10-CM | POA: Diagnosis not present

## 2020-02-07 DIAGNOSIS — R2681 Unsteadiness on feet: Secondary | ICD-10-CM

## 2020-02-07 DIAGNOSIS — G8929 Other chronic pain: Secondary | ICD-10-CM | POA: Diagnosis not present

## 2020-02-07 NOTE — Therapy (Signed)
[facility] [place], [place], [address] Phone: [phone]   Fax:  [phone]  Physical Therapy Treatment  Patient Details  Name: [name] MRN: [id number] Date of Birth: 1932-06-10 Referring Provider (PT): Netta Cedars, MD   Encounter Date: 02/07/2020   PT End of Session - 02/07/20 1353    Visit Number 2    Number of Visits 8    Date for PT Re-Evaluation 03/12/20    Authorization Type Medicare (CQ and KX modifier), BCBS, Progress note every 10th visit    PT Start Time 1348    PT Stop Time 1429    PT Time Calculation (min) 41 min    Equipment Utilized During Treatment Other (comment)   SPC   Activity Tolerance Patient tolerated treatment well    Behavior During Therapy [facility] for tasks assessed/performed           Past Medical History:  Diagnosis Date  . Arthritis   . BNC (bladder neck contracture)   . BPPV (benign paroxysmal positional vertigo)   . Cataract    bilateral  . Diverticulosis   . H/O diarrhea SECONDARY TO RADIATION THERAPY --  INTERMITANT  DIARRHEA  . Hematuria   . History of hemorrhagic cystitis    SECONDARY RADIATION THERAPY 1997  . History of prostate cancer CURRENTLY  ELEVATED PSA--  LUPRON INJECTIONS   S/P PROSTATECTOMY AND RADIATION THERAPY  1997  . Hyperlipidemia   . Hypertension   . Hypothyroidism   . Radiation cystitis   . Radiation proctitis   . Urinary incontinence   . Urinary retention   . Vitamin D deficiency     Past Surgical History:  Procedure Laterality Date  . CATARACT EXTRACTION W/ INTRAOCULAR LENS IMPLANT Right   . COLONOSCOPY W/ POLYPECTOMY  2005; 08/11/2010   2005: 1 cm hyperplastic sigmoid polyp, Dr. Wynetta Emery 2012: hyperplastic splenic flexure polyp -1 cm, diverticulosis, radiation proctitis, anal stenosis  . CYSTO/ FULGERATION OF BLEEDERS/ RESECTION BLADDER NECK CONTRACTURE  07-06-2004   BNC AND RADIATION CYSTITIS  . EYE SURGERY    . KNEE ARTHROSCOPY Right   .  LUMBAR DISC SURGERY  1996   L4 -- L5  . PROSTATECTOMY  1997  . SPINE SURGERY    . TRANSURETHRAL RESECTION OF BLADDER NECK N/A 06/23/2012   Procedure: TRANSURETHRAL RESECTION OF BLADDER NECK CONTRACTURE WITH GYRUS;  Surgeon: Claybon Jabs, MD;  Location: [facility];  Service: Urology;  Laterality: N/A;  . TRANSURETHRAL RESECTION OF BLADDER TUMOR WITH GYRUS (TURBT-GYRUS) N/A 06/01/2013   Procedure: TRANSURETHRAL RESECTION OF BLADDER NECK CONTRACTURE WITH GYRUS (TURBT-GYRUS) AND INJECTION OF KENALOG;  Surgeon: Claybon Jabs, MD;  Location: [place];  Service: Urology;  Laterality: N/A;    There were no vitals filed for this visit.   Subjective Assessment - 02/07/20 1352    Subjective COVID 19 screening performed on patient upon arrival. Reports a little soreness in low back but his knees are alright.    Patient is accompained by: Family member   Wife, [name]   Pertinent History HTN, history of CVA    Limitations Walking;House hold activities    Diagnostic tests x-ray, CT    Patient Stated Goals improve movement    Currently in Pain? No/denies                             [facility] Adult PT Treatment/Exercise - 02/07/20 0001  Balance   Balance Assessed Yes      Standardized Balance Assessment   Standardized Balance Assessment Berg Balance Test      Berg Balance Test   Sit to Stand Needs moderate or maximal assist to stand    Standing Unsupported Able to stand safely 2 minutes    Sitting with Back Unsupported but Feet Supported on Floor or Stool Able to sit safely and securely 2 minutes    Stand to Sit Controls descent by using hands    Transfers Able to transfer safely, definite need of hands    Standing Unsupported with Eyes Closed Able to stand 10 seconds safely    Standing Ubsupported with Feet Together Able to place feet together independently and stand 1 minute safely    From Standing, Reach Forward with Outstretched Arm Can  reach forward >5 cm safely (2")    From Standing Position, Pick up Object from Floor Able to pick up shoe, needs supervision    From Standing Position, Turn to Look Behind Over each Shoulder Turn sideways only but maintains balance    Turn 360 Degrees Able to turn 360 degrees safely but slowly    Standing Unsupported, Alternately Place Feet on Step/Stool Able to stand independently and complete 8 steps >20 seconds    Standing Unsupported, One Foot in Front Able to plae foot ahead of the other independently and hold 30 seconds    Standing on One Leg Able to lift leg independently and hold 5-10 seconds    Total Score 40      Knee/Hip Exercises: Aerobic   Nustep level 3 x10  mins      Knee/Hip Exercises: Seated   Long Arc Quad Strengthening;Right;2 sets;10 reps;Weights    Long Arc Quad Weight 4 lbs.    Clamshell with TheraBand Red   x30 reps   Other Seated Knee/Hip Exercises B heel/toe raise x20 reps    Marching Strengthening;Right;2 sets;10 reps;Weights    Marching Weights 4 lbs.    Hamstring Curl Strengthening;Both;2 sets;10 reps;Limitations    Hamstring Limitations red theraband    Abduction/Adduction  Strengthening;Both;2 sets;10 reps;Limitations    Abd/Adduction Limitations red theraband                       PT Long Term Goals - 02/07/20 1410      PT LONG TERM GOAL #1   Title Patient will be independent with HEP and its progression.    Time 4    Period Weeks    Status On-going      PT LONG TERM GOAL #2   Title Patient will report ability to perform ADLs with right pain less than or equal to 3/10.    Baseline ---    Time 4    Period Weeks    Status On-going      PT LONG TERM GOAL #3   Title Patient will demonstrate 4/5  or greater bilateral LE MMT to improve stability during functional tasks.    Baseline --    Time 4    Period Weeks    Status On-going      PT LONG TERM GOAL #4   Title Patient will perform modified 5x sit to stand test in 20 seconds or  less with UE support to improve balance and improve functional LE strength.    Time 4    Period Weeks    Status On-going      PT LONG TERM GOAL #5  Title Patient will demonstrate a 5+ point improvement on Berg Balance Scale to decrease risk of falls.    Time 4    Period Weeks    Status On-going   initial BERG score 40/56 02/07/2020                Plan - 02/07/20 1443    Clinical Impression Statement Patient presented in clinic with reports of chronic R knee pain. Patient able to score 40/56 on BERG score which was primarily limited by chronic R knee pain and low back soreness. Patient requires max UE support for sit<> stands due to R knee pain. Patient guided through seated therex with resistance with reports of some soreness. Patient provided new red theraband for HEP due to yellow being easy. Patient and his wife were both educated regarding proper brace placement.    Personal Factors and Comorbidities Age;Comorbidity 2    Comorbidities HTN, history of CVA    Examination-Activity Limitations Locomotion Level;Transfers;Stairs;Stand;Hygiene/Grooming;Bathing    Stability/Clinical Decision Making Evolving/Moderate complexity    Rehab Potential Good    PT Frequency 2x / week    PT Duration 4 weeks    PT Treatment/Interventions ADLs/Self Care Home Management;Cryotherapy;Electrical Stimulation;Gait training;Stair training;Functional mobility training;Therapeutic activities;Therapeutic exercise;Moist Heat;Balance training;Neuromuscular re-education;Passive range of motion;Patient/family education;Vasopneumatic Device    PT Next Visit Plan nustep, LE strengthening and balance, review donning and doffing knee brace    PT Home Exercise Plan see patient education section    Consulted and Agree with Plan of Care Patient           Patient will benefit from skilled therapeutic intervention in order to improve the following deficits and impairments:  Abnormal gait,Decreased range of  motion,Decreased activity tolerance,Decreased balance,Decreased strength,Pain  Visit Diagnosis: Chronic pain of right knee  Muscle weakness (generalized)  Unsteadiness on feet     Problem List Patient Active Problem List   Diagnosis Date Noted  . Multiple lacunar infarcts ([place]) 09/30/2016  . Aphasia   . TIA (transient ischemic attack) 04/03/2016  . Metabolic syndrome 32/12/2480  . Osteopenia of neck of left femur 01/15/2016  . H/O prostate cancer 06/27/2015  . Obesity (BMI 30.0-34.9) 12/27/2014  . Hypertension 07/04/2012  . Hyperlipidemia 07/04/2012  . Hypothyroidism 07/04/2012  . Bladder neck contracture 06/22/2012  . Radiation proctitis 08/11/2010  . Anal stenosis 08/11/2010  . Personal history of colonic polyps 08/11/2010    Standley Brooking, PTA 02/07/2020, 3:18 PM  [facility] [address] [place], [place], [address] Phone: [phone]   Fax:  [phone]  Name: [name] MRN: [id number] Date of Birth: Jan 09, 1933

## 2020-02-12 ENCOUNTER — Encounter: Payer: Medicare Other | Admitting: Physical Therapy

## 2020-02-14 ENCOUNTER — Encounter: Payer: Self-pay | Admitting: Physical Therapy

## 2020-02-14 ENCOUNTER — Other Ambulatory Visit: Payer: Self-pay

## 2020-02-14 ENCOUNTER — Ambulatory Visit: Payer: Medicare Other | Admitting: Physical Therapy

## 2020-02-14 DIAGNOSIS — M6281 Muscle weakness (generalized): Secondary | ICD-10-CM

## 2020-02-14 DIAGNOSIS — M25561 Pain in right knee: Secondary | ICD-10-CM | POA: Diagnosis not present

## 2020-02-14 DIAGNOSIS — G8929 Other chronic pain: Secondary | ICD-10-CM

## 2020-02-14 DIAGNOSIS — R2681 Unsteadiness on feet: Secondary | ICD-10-CM | POA: Diagnosis not present

## 2020-02-14 NOTE — Therapy (Signed)
[facility] [place], [place], [address] Phone: [phone]   Fax:  [phone]  Physical Therapy Treatment  Patient Details  Name: [name] MRN: [id number] Date of Birth: 1932-03-29 Referring Provider (PT): Netta Cedars, MD   Encounter Date: 02/14/2020   PT End of Session - 02/14/20 1133    Visit Number 3    Number of Visits 8    Date for PT Re-Evaluation 03/12/20    Authorization Type Medicare (CQ and KX modifier), BCBS, Progress note every 10th visit    PT Start Time 1116    PT Stop Time 1157    PT Time Calculation (min) 41 min    Equipment Utilized During Treatment Other (comment)   SPC   Activity Tolerance Patient tolerated treatment well    Behavior During Therapy [facility] for tasks assessed/performed           Past Medical History:  Diagnosis Date  . Arthritis   . BNC (bladder neck contracture)   . BPPV (benign paroxysmal positional vertigo)   . Cataract    bilateral  . Diverticulosis   . H/O diarrhea SECONDARY TO RADIATION THERAPY --  INTERMITANT  DIARRHEA  . Hematuria   . History of hemorrhagic cystitis    SECONDARY RADIATION THERAPY 1997  . History of prostate cancer CURRENTLY  ELEVATED PSA--  LUPRON INJECTIONS   S/P PROSTATECTOMY AND RADIATION THERAPY  1997  . Hyperlipidemia   . Hypertension   . Hypothyroidism   . Radiation cystitis   . Radiation proctitis   . Urinary incontinence   . Urinary retention   . Vitamin D deficiency     Past Surgical History:  Procedure Laterality Date  . CATARACT EXTRACTION W/ INTRAOCULAR LENS IMPLANT Right   . COLONOSCOPY W/ POLYPECTOMY  2005; 08/11/2010   2005: 1 cm hyperplastic sigmoid polyp, Dr. Wynetta Emery 2012: hyperplastic splenic flexure polyp -1 cm, diverticulosis, radiation proctitis, anal stenosis  . CYSTO/ FULGERATION OF BLEEDERS/ RESECTION BLADDER NECK CONTRACTURE  07-06-2004   BNC AND RADIATION CYSTITIS  . EYE SURGERY    . KNEE ARTHROSCOPY Right   .  LUMBAR DISC SURGERY  1996   L4 -- L5  . PROSTATECTOMY  1997  . SPINE SURGERY    . TRANSURETHRAL RESECTION OF BLADDER NECK N/A 06/23/2012   Procedure: TRANSURETHRAL RESECTION OF BLADDER NECK CONTRACTURE WITH GYRUS;  Surgeon: Claybon Jabs, MD;  Location: [facility];  Service: Urology;  Laterality: N/A;  . TRANSURETHRAL RESECTION OF BLADDER TUMOR WITH GYRUS (TURBT-GYRUS) N/A 06/01/2013   Procedure: TRANSURETHRAL RESECTION OF BLADDER NECK CONTRACTURE WITH GYRUS (TURBT-GYRUS) AND INJECTION OF KENALOG;  Surgeon: Claybon Jabs, MD;  Location: [facility];  Service: Urology;  Laterality: N/A;    There were no vitals filed for this visit.   Subjective Assessment - 02/14/20 1116    Subjective COVID 19 screening performed on patient upon arrival. Reports his knees feeling alright.    Patient is accompained by: Family member   Wife, [name]   Pertinent History HTN, history of CVA    Limitations Walking;House hold activities    Diagnostic tests x-ray, CT    Patient Stated Goals improve movement    Currently in Pain? No/denies              [facility] PT Assessment - 02/14/20 0001      Assessment   Medical Diagnosis Pain in right knee    Referring Provider (PT) Netta Cedars, MD  Next MD Visit n/a    Prior Therapy yes      Precautions   Precautions Fall      Restrictions   Weight Bearing Restrictions No                         [facility] Adult PT Treatment/Exercise - 02/14/20 0001      Exercises   Exercises Lumbar;Knee/Hip      Lumbar Exercises: Seated   Other Seated Lumbar Exercises Core reach with 2# ball and marching x30 rep    Other Seated Lumbar Exercises Core rotation with 2# ball x20 rpes      Knee/Hip Exercises: Aerobic   Nustep L3, seat 13 x15 min      Knee/Hip Exercises: Standing   Heel Raises Both;20 reps    Heel Raises Limitations B toe raise x20 reps    Hip Abduction Stengthening;Both;20 reps;Knee straight      Knee/Hip  Exercises: Seated   Long Arc Quad Strengthening;Both;3 sets;10 reps;Weights    Long Arc Quad Weight 3 lbs.    Ball Squeeze x20 reps 3 sec holds    Clamshell with TheraBand Red   x20 reps   Marching AROM;Both;4 sets;10 reps    Hamstring Curl Strengthening;Both;2 sets;10 reps;Limitations    Hamstring Limitations red theraband    Sit to Sand 20 reps   limited UE support from plinth; 25" plinth heigh     Knee/Hip Exercises: Supine   Bridges Strengthening;20 reps                       PT Long Term Goals - 02/14/20 1130      PT LONG TERM GOAL #1   Title Patient will be independent with HEP and its progression.    Time 4    Period Weeks    Status Partially Met      PT LONG TERM GOAL #2   Title Patient will report ability to perform ADLs with right pain less than or equal to 3/10.    Baseline ---    Time 4    Period Weeks    Status On-going      PT LONG TERM GOAL #3   Title Patient will demonstrate 4/5  or greater bilateral LE MMT to improve stability during functional tasks.    Baseline --    Time 4    Period Weeks    Status On-going      PT LONG TERM GOAL #4   Title Patient will perform modified 5x sit to stand test in 20 seconds or less with UE support to improve balance and improve functional LE strength.    Time 4    Period Weeks    Status On-going      PT LONG TERM GOAL #5   Title Patient will demonstrate a 5+ point improvement on Berg Balance Scale to decrease risk of falls.    Time 4    Period Weeks    Status On-going   initial BERG score 40/56 02/07/2020                Plan - 02/14/20 1201    Clinical Impression Statement Patient presented in clinic with reports of no R knee pain. Patient has experienced less R knee pain since beginning PT and HEP. Patient progressed through LE strengthening with resistance but no complaints of increased pain. Patient does have chronic LBP as well but denies that HEP is increasing  low back soreness. Patient able  to complete sit to stands with limited UE support from 25" plinth height.    Personal Factors and Comorbidities Age;Comorbidity 2    Comorbidities HTN, history of CVA    Examination-Activity Limitations Locomotion Level;Transfers;Stairs;Stand;Hygiene/Grooming;Bathing    Stability/Clinical Decision Making Evolving/Moderate complexity    Rehab Potential Good    PT Frequency 2x / week    PT Duration 4 weeks    PT Treatment/Interventions ADLs/Self Care Home Management;Cryotherapy;Electrical Stimulation;Gait training;Stair training;Functional mobility training;Therapeutic activities;Therapeutic exercise;Moist Heat;Balance training;Neuromuscular re-education;Passive range of motion;Patient/family education;Vasopneumatic Device    PT Next Visit Plan nustep, LE strengthening and balance, review donning and doffing knee brace    PT Home Exercise Plan see patient education section    Consulted and Agree with Plan of Care Patient           Patient will benefit from skilled therapeutic intervention in order to improve the following deficits and impairments:  Abnormal gait,Decreased range of motion,Decreased activity tolerance,Decreased balance,Decreased strength,Pain  Visit Diagnosis: Chronic pain of right knee  Muscle weakness (generalized)  Unsteadiness on feet     Problem List Patient Active Problem List   Diagnosis Date Noted  . Multiple lacunar infarcts ([place]) 09/30/2016  . Aphasia   . TIA (transient ischemic attack) 04/03/2016  . Metabolic syndrome 05/07/6436  . Osteopenia of neck of left femur 01/15/2016  . H/O prostate cancer 06/27/2015  . Obesity (BMI 30.0-34.9) 12/27/2014  . Hypertension 07/04/2012  . Hyperlipidemia 07/04/2012  . Hypothyroidism 07/04/2012  . Bladder neck contracture 06/22/2012  . Radiation proctitis 08/11/2010  . Anal stenosis 08/11/2010  . Personal history of colonic polyps 08/11/2010    Standley Brooking, PTA 02/14/2020, 12:07 PM  [facility] [address] [place], [place], [address] Phone: [phone]   Fax:  [phone]  Name: [name] MRN: [id number] Date of Birth: 10/29/1932

## 2020-02-19 ENCOUNTER — Other Ambulatory Visit: Payer: Self-pay

## 2020-02-19 ENCOUNTER — Encounter: Payer: Self-pay | Admitting: Physical Therapy

## 2020-02-19 ENCOUNTER — Ambulatory Visit: Payer: Medicare Other | Admitting: Physical Therapy

## 2020-02-19 DIAGNOSIS — G8929 Other chronic pain: Secondary | ICD-10-CM

## 2020-02-19 DIAGNOSIS — R2681 Unsteadiness on feet: Secondary | ICD-10-CM

## 2020-02-19 DIAGNOSIS — M6281 Muscle weakness (generalized): Secondary | ICD-10-CM

## 2020-02-19 DIAGNOSIS — M25561 Pain in right knee: Secondary | ICD-10-CM | POA: Diagnosis not present

## 2020-02-19 NOTE — Therapy (Signed)
[facility] [place], [place], [address] Phone: [phone]   Fax:  [phone]  Physical Therapy Treatment  Patient Details  Name: [name] MRN: [id number] Date of Birth: 03/02/1932 Referring Provider (PT): Rodney Cedars, MD   Encounter Date: 02/19/2020   PT End of Session - 02/19/20 1121    Visit Number 4    Number of Visits 8    Date for PT Re-Evaluation 03/12/20    Authorization Type Medicare (CQ and KX modifier), BCBS, Progress note every 10th visit    PT Start Time 1117    PT Stop Time 1158    PT Time Calculation (min) 41 min    Equipment Utilized During Treatment Other (comment)   SPC   Activity Tolerance Patient tolerated treatment well    Behavior During Therapy [facility] for tasks assessed/performed           Past Medical History:  Diagnosis Date  . Arthritis   . BNC (bladder neck contracture)   . BPPV (benign paroxysmal positional vertigo)   . Cataract    bilateral  . Diverticulosis   . H/O diarrhea SECONDARY TO RADIATION THERAPY --  INTERMITANT  DIARRHEA  . Hematuria   . History of hemorrhagic cystitis    SECONDARY RADIATION THERAPY 1997  . History of prostate cancer CURRENTLY  ELEVATED PSA--  LUPRON INJECTIONS   S/P PROSTATECTOMY AND RADIATION THERAPY  1997  . Hyperlipidemia   . Hypertension   . Hypothyroidism   . Radiation cystitis   . Radiation proctitis   . Urinary incontinence   . Urinary retention   . Vitamin D deficiency     Past Surgical History:  Procedure Laterality Date  . CATARACT EXTRACTION W/ INTRAOCULAR LENS IMPLANT Right   . COLONOSCOPY W/ POLYPECTOMY  2005; 08/11/2010   2005: 1 cm hyperplastic sigmoid polyp, Dr. Wynetta Holmes 2012: hyperplastic splenic flexure polyp -1 cm, diverticulosis, radiation proctitis, anal stenosis  . CYSTO/ FULGERATION OF BLEEDERS/ RESECTION BLADDER NECK CONTRACTURE  07-06-2004   BNC AND RADIATION CYSTITIS  . EYE SURGERY    . KNEE ARTHROSCOPY Right   .  LUMBAR DISC SURGERY  1996   L4 -- L5  . PROSTATECTOMY  1997  . SPINE SURGERY    . TRANSURETHRAL RESECTION OF BLADDER NECK N/A 06/23/2012   Procedure: TRANSURETHRAL RESECTION OF BLADDER NECK CONTRACTURE WITH GYRUS;  Surgeon: Rodney Jabs, MD;  Location: [facility];  Service: Urology;  Laterality: N/A;  . TRANSURETHRAL RESECTION OF BLADDER TUMOR WITH GYRUS (TURBT-GYRUS) N/A 06/01/2013   Procedure: TRANSURETHRAL RESECTION OF BLADDER NECK CONTRACTURE WITH GYRUS (TURBT-GYRUS) AND INJECTION OF KENALOG;  Surgeon: Rodney Jabs, MD;  Location: [place];  Service: Urology;  Laterality: N/A;    There were no vitals filed for this visit.   Subjective Assessment - 02/19/20 1121    Subjective COVID 19 screening performed on patient upon arrival. Reports his knees feeling alright and reporting less pain than before.    Patient is accompained by: Family member   Wife, [name]   Pertinent History HTN, history of CVA    Limitations Walking;House hold activities    Diagnostic tests x-ray, CT    Patient Stated Goals improve movement    Currently in Pain? No/denies              [facility] PT Assessment - 02/19/20 0001      Assessment   Medical Diagnosis Pain in right knee    Referring Provider (  PT) Rodney Norris, MD    Next MD Visit n/a    Prior Therapy yes      Precautions   Precautions Fall      Restrictions   Weight Bearing Restrictions No                         [facility] Adult PT Treatment/Exercise - 02/19/20 0001      Knee/Hip Exercises: Aerobic   Nustep L3, seat 13 x20 min      Knee/Hip Exercises: Standing   Heel Raises Both;20 reps    Heel Raises Limitations B toe raise x20 reps    Hip Flexion AROM;Both;2 sets;10 reps;Knee bent    Hip Abduction Stengthening;Both;2 sets;10 reps;Knee straight      Knee/Hip Exercises: Seated   Long Arc Quad Strengthening;Both;3 sets;10 reps;Weights    Long Arc Quad Weight 4 lbs.    Ball Squeeze x20 reps 3  sec holds    Clamshell with TheraBand Red   x30 reps   Hamstring Curl Strengthening;Both;2 sets;10 reps;Limitations    Hamstring Limitations red theraband    Sit to Sand 15 reps;without UE support                       PT Long Term Goals - 02/14/20 1130      PT LONG TERM GOAL #1   Title Patient will be independent with HEP and its progression.    Time 4    Period Weeks    Status Partially Met      PT LONG TERM GOAL #2   Title Patient will report ability to perform ADLs with right pain less than or equal to 3/10.    Baseline ---    Time 4    Period Weeks    Status On-going      PT LONG TERM GOAL #3   Title Patient will demonstrate 4/5  or greater bilateral LE MMT to improve stability during functional tasks.    Baseline --    Time 4    Period Weeks    Status On-going      PT LONG TERM GOAL #4   Title Patient will perform modified 5x sit to stand test in 20 seconds or less with UE support to improve balance and improve functional LE strength.    Time 4    Period Weeks    Status On-going      PT LONG TERM GOAL #5   Title Patient will demonstrate a 5+ point improvement on Berg Balance Scale to decrease risk of falls.    Time 4    Period Weeks    Status On-going   initial BERG score 40/56 02/07/2020                Plan - 02/19/20 1208    Clinical Impression Statement Patient presented in clinic with reports of less R knee pain. Patient guided through various therex for strengthening without complaint of pain. Patient required intermittant multimodal cueing to correct technique. Patient continues to require cueing to improve sit <> stand technique with reducing UE support. Greater trunk flexion and hip flexion instructed.    Personal Factors and Comorbidities Age;Comorbidity 2    Comorbidities HTN, history of CVA    Examination-Activity Limitations Locomotion Level;Transfers;Stairs;Stand;Hygiene/Grooming;Bathing    Stability/Clinical Decision Making  Evolving/Moderate complexity    Rehab Potential Good    PT Frequency 2x / week    PT Duration 4 weeks      PT Treatment/Interventions ADLs/Self Care Home Management;Cryotherapy;Electrical Stimulation;Gait training;Stair training;Functional mobility training;Therapeutic activities;Therapeutic exercise;Moist Heat;Balance training;Neuromuscular re-education;Passive range of motion;Patient/family education;Vasopneumatic Device    PT Next Visit Plan nustep, LE strengthening and balance, review donning and doffing knee brace    PT Home Exercise Plan see patient education section    Consulted and Agree with Plan of Care Patient           Patient will benefit from skilled therapeutic intervention in order to improve the following deficits and impairments:  Abnormal gait,Decreased range of motion,Decreased activity tolerance,Decreased balance,Decreased strength,Pain  Visit Diagnosis: Chronic pain of right knee  Muscle weakness (generalized)  Unsteadiness on feet     Problem List Patient Active Problem List   Diagnosis Date Noted  . Multiple lacunar infarcts ([facility]) 09/30/2016  . Aphasia   . TIA (transient ischemic attack) 04/03/2016  . Metabolic syndrome 01/15/2016  . Osteopenia of neck of left femur 01/15/2016  . H/O prostate cancer 06/27/2015  . Obesity (BMI 30.0-34.9) 12/27/2014  . Hypertension 07/04/2012  . Hyperlipidemia 07/04/2012  . Hypothyroidism 07/04/2012  . Bladder neck contracture 06/22/2012  . Radiation proctitis 08/11/2010  . Anal stenosis 08/11/2010  . Personal history of colonic polyps 08/11/2010     P , PTA 02/19/2020, 12:13 PM  [facility] [address] [place], [place], [address] Phone: [phone]   Fax:  [phone]  Name: Abhijot H Everitt MRN: [id number] Date of Birth: 03/13/1932   

## 2020-02-21 ENCOUNTER — Ambulatory Visit: Payer: Medicare Other | Admitting: *Deleted

## 2020-02-21 ENCOUNTER — Other Ambulatory Visit: Payer: Self-pay

## 2020-02-21 DIAGNOSIS — G8929 Other chronic pain: Secondary | ICD-10-CM

## 2020-02-21 DIAGNOSIS — M25561 Pain in right knee: Secondary | ICD-10-CM | POA: Diagnosis not present

## 2020-02-21 DIAGNOSIS — R2681 Unsteadiness on feet: Secondary | ICD-10-CM | POA: Diagnosis not present

## 2020-02-21 DIAGNOSIS — M6281 Muscle weakness (generalized): Secondary | ICD-10-CM

## 2020-02-21 NOTE — Therapy (Signed)
[facility] [place], [place], [address] Phone: [phone]   Fax:  [phone]  Physical Therapy Treatment  Patient Details  Name: [name] MRN: [id number] Date of Birth: 29-Apr-1932 Referring Provider (PT): Netta Cedars, MD   Encounter Date: 02/21/2020   PT End of Session - 02/21/20 1311    Visit Number 5    Number of Visits 8    Date for PT Re-Evaluation 03/12/20    Authorization Type Medicare (CQ and KX modifier), BCBS, Progress note every 10th visit    PT Start Time 1300    PT Stop Time 1349    PT Time Calculation (min) 49 min           Past Medical History:  Diagnosis Date  . Arthritis   . BNC (bladder neck contracture)   . BPPV (benign paroxysmal positional vertigo)   . Cataract    bilateral  . Diverticulosis   . H/O diarrhea SECONDARY TO RADIATION THERAPY --  INTERMITANT  DIARRHEA  . Hematuria   . History of hemorrhagic cystitis    SECONDARY RADIATION THERAPY 1997  . History of prostate cancer CURRENTLY  ELEVATED PSA--  LUPRON INJECTIONS   S/P PROSTATECTOMY AND RADIATION THERAPY  1997  . Hyperlipidemia   . Hypertension   . Hypothyroidism   . Radiation cystitis   . Radiation proctitis   . Urinary incontinence   . Urinary retention   . Vitamin D deficiency     Past Surgical History:  Procedure Laterality Date  . CATARACT EXTRACTION W/ INTRAOCULAR LENS IMPLANT Right   . COLONOSCOPY W/ POLYPECTOMY  2005; 08/11/2010   2005: 1 cm hyperplastic sigmoid polyp, Dr. Wynetta Emery 2012: hyperplastic splenic flexure polyp -1 cm, diverticulosis, radiation proctitis, anal stenosis  . CYSTO/ FULGERATION OF BLEEDERS/ RESECTION BLADDER NECK CONTRACTURE  07-06-2004   BNC AND RADIATION CYSTITIS  . EYE SURGERY    . KNEE ARTHROSCOPY Right   . LUMBAR DISC SURGERY  1996   L4 -- L5  . PROSTATECTOMY  1997  . SPINE SURGERY    . TRANSURETHRAL RESECTION OF BLADDER NECK N/A 06/23/2012   Procedure: TRANSURETHRAL RESECTION  OF BLADDER NECK CONTRACTURE WITH GYRUS;  Surgeon: Claybon Jabs, MD;  Location: [facility];  Service: Urology;  Laterality: N/A;  . TRANSURETHRAL RESECTION OF BLADDER TUMOR WITH GYRUS (TURBT-GYRUS) N/A 06/01/2013   Procedure: TRANSURETHRAL RESECTION OF BLADDER NECK CONTRACTURE WITH GYRUS (TURBT-GYRUS) AND INJECTION OF KENALOG;  Surgeon: Claybon Jabs, MD;  Location: [place];  Service: Urology;  Laterality: N/A;    There were no vitals filed for this visit.   Subjective Assessment - 02/21/20 1303    Subjective COVID 19 screening performed on patient upon arrival. Reports his knees feeling alright and reporting less pain than before and did ok after last Rx    Patient is accompained by: Family member    Pertinent History HTN, history of CVA    Limitations Walking;House hold activities    Patient Stated Goals improve movement    Currently in Pain? No/denies                             [facility] Adult PT Treatment/Exercise - 02/21/20 0001      Exercises   Exercises Lumbar;Knee/Hip      Knee/Hip Exercises: Aerobic   Nustep L4, seat 13 x15 min      Knee/Hip Exercises: Standing   Heel  Raises Both;20 reps    Heel Raises Limitations B toe raise x20 reps    Hip Flexion AROM;Both;2 sets;10 reps;Knee bent    Hip Abduction Stengthening;Both;2 sets;10 reps;Knee straight    Forward Step Up Both;1 set;10 reps;Step Height: 6";Hand Hold: 2    Other Standing Knee Exercises XTS BLUE:  pulldowns for balance and core 3x10      Knee/Hip Exercises: Seated   Long Arc Quad Strengthening;Both;3 sets;10 reps;Weights    Long Arc Quad Weight 4 lbs.    Clamshell with TheraBand Blue   3x10   Sit to Sand 20 reps;with UE support   limited UE support from plinth; 25" plinth heigh, x10 from chair                      PT Long Term Goals - 02/14/20 1130      PT LONG TERM GOAL #1   Title Patient will be independent with HEP and its progression.     Time 4    Period Weeks    Status Partially Met      PT LONG TERM GOAL #2   Title Patient will report ability to perform ADLs with right pain less than or equal to 3/10.    Baseline ---    Time 4    Period Weeks    Status On-going      PT LONG TERM GOAL #3   Title Patient will demonstrate 4/5  or greater bilateral LE MMT to improve stability during functional tasks.    Baseline --    Time 4    Period Weeks    Status On-going      PT LONG TERM GOAL #4   Title Patient will perform modified 5x sit to stand test in 20 seconds or less with UE support to improve balance and improve functional LE strength.    Time 4    Period Weeks    Status On-going      PT LONG TERM GOAL #5   Title Patient will demonstrate a 5+ point improvement on Berg Balance Scale to decrease risk of falls.    Time 4    Period Weeks    Status On-going   initial BERG score 40/56 02/07/2020                Plan - 02/21/20 1311    Clinical Impression Statement Pt arrived today doing farly well , but wanted to work on sit to stand more today at different levels. He did well with Rx and better with technique with fewer  cues.    Personal Factors and Comorbidities Age;Comorbidity 2    Comorbidities HTN, history of CVA    Examination-Activity Limitations Locomotion Level;Transfers;Stairs;Stand;Hygiene/Grooming;Bathing    PT Treatment/Interventions ADLs/Self Care Home Management;Cryotherapy;Electrical Stimulation;Gait training;Stair training;Functional mobility training;Therapeutic activities;Therapeutic exercise;Moist Heat;Balance training;Neuromuscular re-education;Passive range of motion;Patient/family education;Vasopneumatic Device    PT Next Visit Plan nustep, LE strengthening and balance, review donning and doffing knee brace    PT Home Exercise Plan see patient education section    Consulted and Agree with Plan of Care Patient           Patient will benefit from skilled therapeutic intervention in order  to improve the following deficits and impairments:  Abnormal gait,Decreased range of motion,Decreased activity tolerance,Decreased balance,Decreased strength,Pain  Visit Diagnosis: Chronic pain of right knee  Muscle weakness (generalized)  Unsteadiness on feet     Problem List Patient Active Problem List   Diagnosis Date  Noted  . Multiple lacunar infarcts ([place]) 09/30/2016  . Aphasia   . TIA (transient ischemic attack) 04/03/2016  . Metabolic syndrome 30/14/1597  . Osteopenia of neck of left femur 01/15/2016  . H/O prostate cancer 06/27/2015  . Obesity (BMI 30.0-34.9) 12/27/2014  . Hypertension 07/04/2012  . Hyperlipidemia 07/04/2012  . Hypothyroidism 07/04/2012  . Bladder neck contracture 06/22/2012  . Radiation proctitis 08/11/2010  . Anal stenosis 08/11/2010  . Personal history of colonic polyps 08/11/2010    Kristy Schomburg,CHRIS, PTA 02/21/2020, 5:37 PM  [facility] [address] [place], [place], [address] Phone: [phone]   Fax:  [phone]  Name: [name] MRN: [id number] Date of Birth: January 27, 1932

## 2020-02-26 ENCOUNTER — Encounter: Payer: Self-pay | Admitting: Physical Therapy

## 2020-02-26 ENCOUNTER — Other Ambulatory Visit: Payer: Self-pay

## 2020-02-26 ENCOUNTER — Ambulatory Visit: Payer: Medicare Other | Attending: Orthopedic Surgery | Admitting: Physical Therapy

## 2020-02-26 DIAGNOSIS — M6281 Muscle weakness (generalized): Secondary | ICD-10-CM | POA: Insufficient documentation

## 2020-02-26 DIAGNOSIS — G8929 Other chronic pain: Secondary | ICD-10-CM | POA: Insufficient documentation

## 2020-02-26 DIAGNOSIS — M25561 Pain in right knee: Secondary | ICD-10-CM | POA: Insufficient documentation

## 2020-02-26 DIAGNOSIS — R2681 Unsteadiness on feet: Secondary | ICD-10-CM | POA: Diagnosis not present

## 2020-02-26 NOTE — Therapy (Signed)
[facility] [place], [place], [address] Phone: [phone]   Fax:  [phone]  Physical Therapy Treatment  Patient Details  Name: [name] MRN: [id number] Date of Birth: 07-12-1932 Referring Provider (PT): Netta Cedars, MD   Encounter Date: 02/26/2020   PT End of Session - 02/26/20 1126    Visit Number 6    Number of Visits 8    Date for PT Re-Evaluation 03/12/20    Authorization Type Medicare (CQ and KX modifier), BCBS, Progress note every 10th visit    PT Start Time 1115    PT Stop Time 1158    PT Time Calculation (min) 43 min    Equipment Utilized During Treatment --   [facility]   Activity Tolerance Patient tolerated treatment well    Behavior During Therapy [facility] for tasks assessed/performed           Past Medical History:  Diagnosis Date  . Arthritis   . BNC (bladder neck contracture)   . BPPV (benign paroxysmal positional vertigo)   . Cataract    bilateral  . Diverticulosis   . H/O diarrhea SECONDARY TO RADIATION THERAPY --  INTERMITANT  DIARRHEA  . Hematuria   . History of hemorrhagic cystitis    SECONDARY RADIATION THERAPY 1997  . History of prostate cancer CURRENTLY  ELEVATED PSA--  LUPRON INJECTIONS   S/P PROSTATECTOMY AND RADIATION THERAPY  1997  . Hyperlipidemia   . Hypertension   . Hypothyroidism   . Radiation cystitis   . Radiation proctitis   . Urinary incontinence   . Urinary retention   . Vitamin D deficiency     Past Surgical History:  Procedure Laterality Date  . CATARACT EXTRACTION W/ INTRAOCULAR LENS IMPLANT Right   . COLONOSCOPY W/ POLYPECTOMY  2005; 08/11/2010   2005: 1 cm hyperplastic sigmoid polyp, Dr. Wynetta Emery 2012: hyperplastic splenic flexure polyp -1 cm, diverticulosis, radiation proctitis, anal stenosis  . CYSTO/ FULGERATION OF BLEEDERS/ RESECTION BLADDER NECK CONTRACTURE  07-06-2004   BNC AND RADIATION CYSTITIS  . EYE SURGERY    . KNEE ARTHROSCOPY Right   . LUMBAR DISC  SURGERY  1996   L4 -- L5  . PROSTATECTOMY  1997  . SPINE SURGERY    . TRANSURETHRAL RESECTION OF BLADDER NECK N/A 06/23/2012   Procedure: TRANSURETHRAL RESECTION OF BLADDER NECK CONTRACTURE WITH GYRUS;  Surgeon: Claybon Jabs, MD;  Location: [facility];  Service: Urology;  Laterality: N/A;  . TRANSURETHRAL RESECTION OF BLADDER TUMOR WITH GYRUS (TURBT-GYRUS) N/A 06/01/2013   Procedure: TRANSURETHRAL RESECTION OF BLADDER NECK CONTRACTURE WITH GYRUS (TURBT-GYRUS) AND INJECTION OF KENALOG;  Surgeon: Claybon Jabs, MD;  Location: [facility];  Service: Urology;  Laterality: N/A;    There were no vitals filed for this visit.   Subjective Assessment - 02/26/20 1125    Subjective COVID 19 screening performed on patient upon arrival. Patient reports feeling good. No pain in the knee currently.    Patient is accompained by: Family member    Pertinent History HTN, history of CVA    Limitations Walking;House hold activities    Diagnostic tests x-ray, CT    Patient Stated Goals improve movement    Currently in Pain? No/denies              [facility] PT Assessment - 02/26/20 0001      Assessment   Medical Diagnosis Pain in right knee    Referring Provider (PT) Netta Cedars, MD  Next MD Visit n/a    Prior Therapy yes      Precautions   Precautions Fall      Restrictions   Weight Bearing Restrictions No                         [facility] Adult PT Treatment/Exercise - 02/26/20 0001      Exercises   Exercises Lumbar;Knee/Hip      Knee/Hip Exercises: Aerobic   Nustep L4, seat 13 x15 min      Knee/Hip Exercises: Standing   Heel Raises Both;20 reps    Heel Raises Limitations B toe raise x20 reps    Knee Flexion AROM;Both;20 reps    Forward Step Up Both;1 set;10 reps;Step Height: 6";Hand Hold: 2    SLS with Vectors SLS with colored pod touching each 2 hand UE support x10 followed by x5 with R UE support then L UE support x5      Knee/Hip  Exercises: Seated   Long Arc Quad Strengthening;Both;2 sets;10 reps;Weights    Long Arc Quad Weight 4 lbs.    Clamshell with TheraBand Blue   x30 reps   Marching AROM;Both;10 reps;2 sets    Hamstring Curl Strengthening;Both;2 sets;10 reps;Limitations    Hamstring Limitations blue band                       PT Long Term Goals - 02/14/20 1130      PT LONG TERM GOAL #1   Title Patient will be independent with HEP and its progression.    Time 4    Period Weeks    Status Partially Met      PT LONG TERM GOAL #2   Title Patient will report ability to perform ADLs with right pain less than or equal to 3/10.    Baseline ---    Time 4    Period Weeks    Status On-going      PT LONG TERM GOAL #3   Title Patient will demonstrate 4/5  or greater bilateral LE MMT to improve stability during functional tasks.    Baseline --    Time 4    Period Weeks    Status On-going      PT LONG TERM GOAL #4   Title Patient will perform modified 5x sit to stand test in 20 seconds or less with UE support to improve balance and improve functional LE strength.    Time 4    Period Weeks    Status On-going      PT LONG TERM GOAL #5   Title Patient will demonstrate a 5+ point improvement on Berg Balance Scale to decrease risk of falls.    Time 4    Period Weeks    Status On-going   initial BERG score 40/56 02/07/2020                Plan - 02/26/20 1202    Clinical Impression Statement Patient responded well to therapy with no reports of increased pain while performing TEs. Patient guided thorugh standing SLS with decreasing UE support with good response and no reports of pain. Patient requires the use of momentum and some UE support to perform sit to stands. Patient has 2 visits remaining in plan of care then anticipate for DC with emphasis on HEP    Personal Factors and Comorbidities Age;Comorbidity 2    Comorbidities HTN, history of CVA    Examination-Activity Limitations  Locomotion Level;Transfers;Stairs;Stand;Hygiene/Grooming;Bathing    Stability/Clinical Decision Making Evolving/Moderate complexity    Clinical Decision Making Moderate    Rehab Potential Good    PT Frequency 2x / week    PT Duration 4 weeks    PT Treatment/Interventions ADLs/Self Care Home Management;Cryotherapy;Electrical Stimulation;Gait training;Stair training;Functional mobility training;Therapeutic activities;Therapeutic exercise;Moist Heat;Balance training;Neuromuscular re-education;Passive range of motion;Patient/family education;Vasopneumatic Device    PT Next Visit Plan nustep, LE strengthening and balance, review donning and doffing knee brace    PT Home Exercise Plan see patient education section    Consulted and Agree with Plan of Care Patient           Patient will benefit from skilled therapeutic intervention in order to improve the following deficits and impairments:  Abnormal gait,Decreased range of motion,Decreased activity tolerance,Decreased balance,Decreased strength,Pain  Visit Diagnosis: Chronic pain of right knee  Muscle weakness (generalized)  Unsteadiness on feet     Problem List Patient Active Problem List   Diagnosis Date Noted  . Multiple lacunar infarcts ([place]) 09/30/2016  . Aphasia   . TIA (transient ischemic attack) 04/03/2016  . Metabolic syndrome 29/92/4268  . Osteopenia of neck of left femur 01/15/2016  . H/O prostate cancer 06/27/2015  . Obesity (BMI 30.0-34.9) 12/27/2014  . Hypertension 07/04/2012  . Hyperlipidemia 07/04/2012  . Hypothyroidism 07/04/2012  . Bladder neck contracture 06/22/2012  . Radiation proctitis 08/11/2010  . Anal stenosis 08/11/2010  . Personal history of colonic polyps 08/11/2010    Gabriela Eves 02/26/2020, 12:06 PM  [facility] [address] [place], [place], [address] Phone: [phone]   Fax:  [phone]  Name: [name] MRN: [id number] Date of  Birth: August 09, 1932

## 2020-02-28 ENCOUNTER — Other Ambulatory Visit: Payer: Self-pay

## 2020-02-28 ENCOUNTER — Ambulatory Visit: Payer: Medicare Other | Admitting: *Deleted

## 2020-02-28 DIAGNOSIS — M6281 Muscle weakness (generalized): Secondary | ICD-10-CM

## 2020-02-28 DIAGNOSIS — R2681 Unsteadiness on feet: Secondary | ICD-10-CM

## 2020-02-28 DIAGNOSIS — M25561 Pain in right knee: Secondary | ICD-10-CM | POA: Diagnosis not present

## 2020-02-28 DIAGNOSIS — G8929 Other chronic pain: Secondary | ICD-10-CM

## 2020-02-28 NOTE — Therapy (Signed)
[facility] [place], [place], [address] Phone: [phone]   Fax:  [phone]  Physical Therapy Treatment  Patient Details  Name: [name] MRN: [id number] Date of Birth: [birth year] Referring Provider (PT): Netta Cedars, MD   Encounter Date: 02/28/2020   PT End of Session - 02/28/20 1144    Visit Number 7    Number of Visits 8    Date for PT Re-Evaluation 03/12/20    Authorization Type Medicare (CQ and KX modifier), BCBS, Progress note every 10th visit    PT Start Time 1115    PT Stop Time 1202    PT Time Calculation (min) 47 min           Past Medical History:  Diagnosis Date  . Arthritis   . BNC (bladder neck contracture)   . BPPV (benign paroxysmal positional vertigo)   . Cataract    bilateral  . Diverticulosis   . H/O diarrhea SECONDARY TO RADIATION THERAPY --  INTERMITANT  DIARRHEA  . Hematuria   . History of hemorrhagic cystitis    SECONDARY RADIATION THERAPY 1997  . History of prostate cancer CURRENTLY  ELEVATED PSA--  LUPRON INJECTIONS   S/P PROSTATECTOMY AND RADIATION THERAPY  1997  . Hyperlipidemia   . Hypertension   . Hypothyroidism   . Radiation cystitis   . Radiation proctitis   . Urinary incontinence   . Urinary retention   . Vitamin D deficiency     Past Surgical History:  Procedure Laterality Date  . CATARACT EXTRACTION W/ INTRAOCULAR LENS IMPLANT Right   . COLONOSCOPY W/ POLYPECTOMY  2005; 08/11/2010   2005: 1 cm hyperplastic sigmoid polyp, Dr. Wynetta Emery 2012: hyperplastic splenic flexure polyp -1 cm, diverticulosis, radiation proctitis, anal stenosis  . CYSTO/ FULGERATION OF BLEEDERS/ RESECTION BLADDER NECK CONTRACTURE  07-06-2004   BNC AND RADIATION CYSTITIS  . EYE SURGERY    . KNEE ARTHROSCOPY Right   . LUMBAR DISC SURGERY  1996   L4 -- L5  . PROSTATECTOMY  1997  . SPINE SURGERY    . TRANSURETHRAL RESECTION OF BLADDER NECK N/A 06/23/2012   Procedure: TRANSURETHRAL RESECTION  OF BLADDER NECK CONTRACTURE WITH GYRUS;  Surgeon: Claybon Jabs, MD;  Location: [facility];  Service: Urology;  Laterality: N/A;  . TRANSURETHRAL RESECTION OF BLADDER TUMOR WITH GYRUS (TURBT-GYRUS) N/A 06/01/2013   Procedure: TRANSURETHRAL RESECTION OF BLADDER NECK CONTRACTURE WITH GYRUS (TURBT-GYRUS) AND INJECTION OF KENALOG;  Surgeon: Claybon Jabs, MD;  Location: [facility];  Service: Urology;  Laterality: N/A;    There were no vitals filed for this visit.   Subjective Assessment - 02/28/20 1130    Subjective COVID 19 screening performed on patient upon arrival. Patient reports feeling good.  Doing better.    Pertinent History HTN, history of CVA    Limitations Walking;House hold activities    Patient Stated Goals improve movement    Currently in Pain? No/denies                             [facility] Adult PT Treatment/Exercise - 02/28/20 0001      Exercises   Exercises Lumbar;Knee/Hip      Knee/Hip Exercises: Aerobic   Nustep L4, seat 13 x15 min      Knee/Hip Exercises: Standing   Heel Raises Both;20 reps    Heel Raises Limitations B toe raise x20 reps    Knee Flexion  AROM;Both;20 reps    Hip Flexion AROM;Both;2 sets;10 reps;Knee bent    Hip Abduction Stengthening;Both;2 sets;10 reps;Knee straight    Forward Step Up Both;1 set;10 reps;Step Height: 6";Hand Hold: 2    Rocker Board 3 minutes   PF/DF balance CGA   SLS with Vectors SLS with colored pod touching each 2 hand UE support x10 followed by x5 with R UE support then L UE support x5      Knee/Hip Exercises: Seated   Long Arc Quad Strengthening;Both;10 reps;Weights;3 sets    [employer] Weight 5 lbs.    Clamshell with TheraBand Green   x50 reps   Marching AROM;Both;10 reps;2 sets;Strengthening   green   Hamstring Curl Strengthening;Both;2 sets;Limitations;15 reps    Hamstring Limitations blue band    Sit to Sand 2 sets;5 reps   19 secs today                       PT Long Term Goals - 02/28/20 1139      PT LONG TERM GOAL #1   Title Patient will be independent with HEP and its progression.    Time 4    Period Weeks    Status Partially Met      PT LONG TERM GOAL #2   Title Patient will report ability to perform ADLs with right pain less than or equal to 3/10.    Time 4    Period Weeks    Status On-going      PT LONG TERM GOAL #3   Title Patient will demonstrate 4/5  or greater bilateral LE MMT to improve stability during functional tasks.    Period Weeks    Status On-going      PT LONG TERM GOAL #4   Title Patient will perform modified 5x sit to stand test in 20 seconds or less with UE support to improve balance and improve functional LE strength.    Baseline 02-28-20  19 secs    Time 4    Period Weeks    Status Achieved      PT LONG TERM GOAL #5   Title Patient will demonstrate a 5+ point improvement on Berg Balance Scale to decrease risk of falls.    Time 4    Period Weeks    Status On-going                 Plan - 02/28/20 1330    Clinical Impression Statement Pt arrived today doing fairly well and feels he is progressing with balance and strength. He was able to meet LTG for sit to stand today with 5x in 19 secs. DC after next visit.    Comorbidities HTN, history of CVA    Examination-Activity Limitations Locomotion Level;Transfers;Stairs;Stand;Hygiene/Grooming;Bathing    Stability/Clinical Decision Making Evolving/Moderate complexity    PT Frequency 2x / week    PT Treatment/Interventions ADLs/Self Care Home Management;Cryotherapy;Electrical Stimulation;Gait training;Stair training;Functional mobility training;Therapeutic activities;Therapeutic exercise;Moist Heat;Balance training;Neuromuscular re-education;Passive range of motion;Patient/family education;Vasopneumatic Device    PT Next Visit Plan nustep, LE strengthening and balance, review donning and doffing knee brace    PT Home Exercise Plan  see patient education section    Consulted and Agree with Plan of Care Patient           Patient will benefit from skilled therapeutic intervention in order to improve the following deficits and impairments:  Abnormal gait,Decreased range of motion,Decreased activity tolerance,Decreased balance,Decreased strength,Pain  Visit Diagnosis: Muscle weakness (generalized)  Chronic pain of right knee  Unsteadiness on feet     Problem List Patient Active Problem List   Diagnosis Date Noted  . Multiple lacunar infarcts ([place]) 09/30/2016  . Aphasia   . TIA (transient ischemic attack) 04/03/2016  . Metabolic syndrome 54/56/2563  . Osteopenia of neck of left femur 01/15/2016  . H/O prostate cancer 06/27/2015  . Obesity (BMI 30.0-34.9) 12/27/2014  . Hypertension 07/04/2012  . Hyperlipidemia 07/04/2012  . Hypothyroidism 07/04/2012  . Bladder neck contracture 06/22/2012  . Radiation proctitis 08/11/2010  . Anal stenosis 08/11/2010  . Personal history of colonic polyps 08/11/2010    Prynce Jacober,CHRIS, PTA 02/28/2020, 1:34 PM  [facility] [address] [place], [place], [address] Phone: [phone]   Fax:  [phone]  Name: [name] MRN: [id number] Date of Birth: Apr 29, 1932

## 2020-03-04 ENCOUNTER — Other Ambulatory Visit: Payer: Self-pay

## 2020-03-04 ENCOUNTER — Ambulatory Visit: Payer: Medicare Other | Admitting: *Deleted

## 2020-03-04 DIAGNOSIS — M25561 Pain in right knee: Secondary | ICD-10-CM

## 2020-03-04 DIAGNOSIS — G8929 Other chronic pain: Secondary | ICD-10-CM | POA: Diagnosis not present

## 2020-03-04 DIAGNOSIS — R2681 Unsteadiness on feet: Secondary | ICD-10-CM | POA: Diagnosis not present

## 2020-03-04 DIAGNOSIS — M6281 Muscle weakness (generalized): Secondary | ICD-10-CM

## 2020-03-04 NOTE — Therapy (Signed)
[facility] [place], [place], [address] Phone: [phone]   Fax:  [phone]  Physical Therapy Treatment  PHYSICAL THERAPY DISCHARGE SUMMARY  Visits from Start of Care: 8   Current functional level related to goals / functional outcomes: See below   Remaining deficits: See goals   Education / Equipment: HEP Plan: Patient agrees to discharge.  Patient goals were partially met. Patient is being discharged due to being pleased with the current functional level.  ?????      Patient Details  Name: [name] MRN: [id number] Date of Birth: [birth year] Referring Provider (PT): Netta Cedars, MD    Encounter Date: 03/04/2020   PT End of Session - 03/04/20 1131    Visit Number 8    Number of Visits 8    Date for PT Re-Evaluation 03/12/20    Authorization Type Medicare (CQ and KX modifier), BCBS, Progress note every 10th visit    PT Start Time 1115    PT Stop Time 1202    PT Time Calculation (min) 47 min           Past Medical History:  Diagnosis Date  . Arthritis   . BNC (bladder neck contracture)   . BPPV (benign paroxysmal positional vertigo)   . Cataract    bilateral  . Diverticulosis   . H/O diarrhea SECONDARY TO RADIATION THERAPY --  INTERMITANT  DIARRHEA  . Hematuria   . History of hemorrhagic cystitis    SECONDARY RADIATION THERAPY 1997  . History of prostate cancer CURRENTLY  ELEVATED PSA--  LUPRON INJECTIONS   S/P PROSTATECTOMY AND RADIATION THERAPY  1997  . Hyperlipidemia   . Hypertension   . Hypothyroidism   . Radiation cystitis   . Radiation proctitis   . Urinary incontinence   . Urinary retention   . Vitamin D deficiency     Past Surgical History:  Procedure Laterality Date  . CATARACT EXTRACTION W/ INTRAOCULAR LENS IMPLANT Right   . COLONOSCOPY W/ POLYPECTOMY  2005; 08/11/2010   2005: 1 cm hyperplastic sigmoid polyp, Dr. Wynetta Emery 2012: hyperplastic splenic flexure polyp -1 cm,  diverticulosis, radiation proctitis, anal stenosis  . CYSTO/ FULGERATION OF BLEEDERS/ RESECTION BLADDER NECK CONTRACTURE  07-06-2004   BNC AND RADIATION CYSTITIS  . EYE SURGERY    . KNEE ARTHROSCOPY Right   . LUMBAR DISC SURGERY  1996   L4 -- L5  . PROSTATECTOMY  1997  . SPINE SURGERY    . TRANSURETHRAL RESECTION OF BLADDER NECK N/A 06/23/2012   Procedure: TRANSURETHRAL RESECTION OF BLADDER NECK CONTRACTURE WITH GYRUS;  Surgeon: Claybon Jabs, MD;  Location: [facility];  Service: Urology;  Laterality: N/A;  . TRANSURETHRAL RESECTION OF BLADDER TUMOR WITH GYRUS (TURBT-GYRUS) N/A 06/01/2013   Procedure: TRANSURETHRAL RESECTION OF BLADDER NECK CONTRACTURE WITH GYRUS (TURBT-GYRUS) AND INJECTION OF KENALOG;  Surgeon: Claybon Jabs, MD;  Location: [facility];  Service: Urology;  Laterality: N/A;    There were no vitals filed for this visit.       [facility] PT Assessment - 03/04/20 0001      Berg Balance Test   Sit to Stand Able to stand  independently using hands                         [facility] Adult PT Treatment/Exercise - 03/04/20 0001      Berg Balance Test   Sit to Stand Able to stand  independently using hands    Standing Unsupported Able to stand safely 2 minutes    Sitting with Back Unsupported but Feet Supported on Floor or Stool Able to sit safely and securely 2 minutes    Stand to Sit Controls descent by using hands    Transfers Able to transfer safely, definite need of hands    Standing Unsupported with Eyes Closed Able to stand 10 seconds safely    Standing Ubsupported with Feet Together Able to place feet together independently and stand 1 minute safely    From Standing, Reach Forward with Outstretched Arm Can reach forward >12 cm safely (5")    From Standing Position, Pick up Object from Floor Able to pick up shoe safely and easily    From Standing Position, Turn to Look Behind Over each Shoulder Looks behind one side only/other  side shows less weight shift    Turn 360 Degrees Able to turn 360 degrees safely but slowly    Standing Unsupported, Alternately Place Feet on Step/Stool Able to stand independently and complete 8 steps >20 seconds    Standing Unsupported, One Foot in Front Able to plae foot ahead of the other independently and hold 30 seconds    Standing on One Leg Able to lift leg independently and hold 5-10 seconds    Total Score 46      Exercises   Exercises Lumbar;Knee/Hip      Knee/Hip Exercises: Aerobic   Nustep L4, seat 13 x15 min      Knee/Hip Exercises: Standing   Heel Raises Both;20 reps    Heel Raises Limitations B toe raise x20 reps    Knee Flexion AROM;Both;20 reps    SLS with Vectors SLS with colored pod touching each 2 hand UE support x10 followed by x5 with R UE support then L UE support x5      Knee/Hip Exercises: Seated   Ball Squeeze x20 reps 3 sec holds    Clamshell with TheraBand Green   x50 reps   Sit to [employer] 2 sets;5 reps   19 secs today                      PT Long Term Goals - 03/04/20 1134      PT LONG TERM GOAL #1   Title Patient will be independent with HEP and its progression.    Time 4    Period Weeks    Status Partially Met      PT LONG TERM GOAL #2   Title Patient will report ability to perform ADLs with right pain less than or equal to 3/10.    Time 4    Period Weeks    Status Achieved      PT LONG TERM GOAL #3   Title Patient will demonstrate 4/5  or greater bilateral LE MMT to improve stability during functional tasks.    Period Weeks    Status Achieved      PT LONG TERM GOAL #4   Title Patient will perform modified 5x sit to stand test in 20 seconds or less with UE support to improve balance and improve functional LE strength.    Baseline 02-28-20  19 secs    Status Achieved      PT LONG TERM GOAL #5   Title Patient will demonstrate a 5+ point improvement on Berg Balance Scale to decrease risk of falls.    Time 4    Status Achieved  46                Plan - 03/04/20 1207    Clinical Impression Statement Pt arrived today doing fairly well and was able to complete Berg test with an increased score to meet LTG. All LTGs were met and Pt will be DC to HEP at this time.    Personal Factors and Comorbidities Age;Comorbidity 2    Comorbidities HTN, history of CVA    PT Treatment/Interventions ADLs/Self Care Home Management;Cryotherapy;Electrical Stimulation;Gait training;Stair training;Functional mobility training;Therapeutic activities;Therapeutic exercise;Moist Heat;Balance training;Neuromuscular re-education;Passive range of motion;Patient/family education;Vasopneumatic Device    PT Next Visit Plan DC to HEP           Patient will benefit from skilled therapeutic intervention in order to improve the following deficits and impairments:  Abnormal gait,Decreased range of motion,Decreased activity tolerance,Decreased balance,Decreased strength,Pain  Visit Diagnosis: Muscle weakness (generalized)  Chronic pain of right knee  Unsteadiness on feet     Problem List Patient Active Problem List   Diagnosis Date Noted  . Multiple lacunar infarcts ([place]) 09/30/2016  . Aphasia   . TIA (transient ischemic attack) 04/03/2016  . Metabolic syndrome 73/66/8159  . Osteopenia of neck of left femur 01/15/2016  . H/O prostate cancer 06/27/2015  . Obesity (BMI 30.0-34.9) 12/27/2014  . Hypertension 07/04/2012  . Hyperlipidemia 07/04/2012  . Hypothyroidism 07/04/2012  . Bladder neck contracture 06/22/2012  . Radiation proctitis 08/11/2010  . Anal stenosis 08/11/2010  . Personal history of colonic polyps 08/11/2010    Harbert Fitterer,CHRIS, PTA 03/04/2020, 12:09 PM  [facility] [address] [place], [place], [address] Phone: [phone]   Fax:  [phone]  Name: NORMA IGNASIAK MRN: [id number] Date of Birth: 1932-03-22

## 2020-04-15 DIAGNOSIS — B351 Tinea unguium: Secondary | ICD-10-CM | POA: Diagnosis not present

## 2020-04-15 DIAGNOSIS — M79673 Pain in unspecified foot: Secondary | ICD-10-CM | POA: Diagnosis not present

## 2020-04-15 DIAGNOSIS — L84 Corns and callosities: Secondary | ICD-10-CM | POA: Diagnosis not present

## 2020-04-15 DIAGNOSIS — I70203 Unspecified atherosclerosis of native arteries of extremities, bilateral legs: Secondary | ICD-10-CM | POA: Diagnosis not present

## 2020-05-04 ENCOUNTER — Other Ambulatory Visit: Payer: Self-pay | Admitting: Family Medicine

## 2020-05-04 DIAGNOSIS — I10 Essential (primary) hypertension: Secondary | ICD-10-CM

## 2020-05-08 ENCOUNTER — Encounter: Payer: Self-pay | Admitting: Family Medicine

## 2020-05-08 ENCOUNTER — Other Ambulatory Visit: Payer: Self-pay

## 2020-05-08 ENCOUNTER — Ambulatory Visit (INDEPENDENT_AMBULATORY_CARE_PROVIDER_SITE_OTHER): Payer: Medicare Other | Admitting: Family Medicine

## 2020-05-08 VITALS — BP 143/67 | HR 70 | Temp 97.1°F | Ht 71.0 in | Wt 237.0 lb

## 2020-05-08 DIAGNOSIS — R42 Dizziness and giddiness: Secondary | ICD-10-CM

## 2020-05-08 DIAGNOSIS — E669 Obesity, unspecified: Secondary | ICD-10-CM | POA: Diagnosis not present

## 2020-05-08 DIAGNOSIS — I6381 Other cerebral infarction due to occlusion or stenosis of small artery: Secondary | ICD-10-CM

## 2020-05-08 DIAGNOSIS — E782 Mixed hyperlipidemia: Secondary | ICD-10-CM | POA: Diagnosis not present

## 2020-05-08 DIAGNOSIS — E8881 Metabolic syndrome: Secondary | ICD-10-CM | POA: Diagnosis not present

## 2020-05-08 DIAGNOSIS — I6930 Unspecified sequelae of cerebral infarction: Secondary | ICD-10-CM | POA: Diagnosis not present

## 2020-05-08 DIAGNOSIS — E039 Hypothyroidism, unspecified: Secondary | ICD-10-CM | POA: Diagnosis not present

## 2020-05-08 DIAGNOSIS — I10 Essential (primary) hypertension: Secondary | ICD-10-CM

## 2020-05-08 MED ORDER — METOPROLOL SUCCINATE ER 25 MG PO TB24: ORAL_TABLET | ORAL | 0 refills | Status: DC

## 2020-05-08 NOTE — Progress Notes (Signed)
Subjective:  Patient ID: [name], male    DOB: 07/22/1932  Age: 87 y.o. MRN: [id number]  CC: Hyperlipidemia, Hypertension, and Hypothyroidism   HPI ZYAD BOOMER presents for  follow-up of hypertension. Patient has no history of headache chest pain or shortness of breath or recent cough. Patient also denies symptoms of TIA such as focal numbness or weakness. Patient patient reports a dizziness that makes him feel drunk.  This has been going on for a long time.  He is concerned that it is caused by his metoprolol.  Patient presents for follow-up on  thyroid. The patient has a history of hypothyroidism for many years. It has been stable recently. Pt. denies any change in  voice, loss of hair, heat or cold intolerance. Energy level has been adequate to good. Patient denies constipation and diarrhea. No myxedema. Medication is as noted below. Verified that pt is taking it daily on an empty stomach. Well tolerated.  Patient in for follow-up of elevated cholesterol. Doing well without complaints on current medication. Denies side effects of statin including myalgia and arthralgia and nausea. Also in today for liver function testing. Currently no chest pain, shortness of breath or other cardiovascular related symptoms noted. History Juron has a past medical history of Aphasia, Arthritis, BNC (bladder neck contracture), BPPV (benign paroxysmal positional vertigo), Cataract, Diverticulosis, H/O diarrhea (SECONDARY TO RADIATION THERAPY --  INTERMITANT  DIARRHEA), Hematuria, History of hemorrhagic cystitis, History of prostate cancer (CURRENTLY  ELEVATED PSA--  LUPRON INJECTIONS), Hyperlipidemia, Hypertension, Hypothyroidism, Radiation cystitis, Radiation proctitis, Urinary incontinence, Urinary retention, and Vitamin D deficiency.   He has a past surgical history that includes Prostatectomy (1997); Colonoscopy w/ polypectomy (2005; 08/11/2010); CYSTO/ FULGERATION OF BLEEDERS/ RESECTION BLADDER NECK  CONTRACTURE (07-06-2004); Cataract extraction w/ intraocular lens implant (Right); Lumbar disc surgery (1996); Transurethral resection of bladder neck (N/A, 06/23/2012); Transurethral resection of bladder tumor with gyrus (turbt-gyrus) (N/A, 06/01/2013); Eye surgery; Spine surgery; and Knee arthroscopy (Right).   His family history includes Alzheimer's disease in his brother; Appendicitis (age of onset: 17) in his sister; CVA (age of onset: 69) in his mother; Cancer in his sister and sister; Congestive Heart Failure in his mother; Early death in his sister; Hip fracture in his brother; Kidney disease in his brother and sister; Obesity in his sister; Pancreatitis in his father; Pneumonia in his father; Prostate cancer in his brother.He reports that he quit smoking about 57 years ago. His smoking use included cigarettes. He quit after 20.00 years of use. He quit smokeless tobacco use about 57 years ago.  His smokeless tobacco use included chew. He reports that he does not drink alcohol and does not use drugs.  Current Outpatient Medications on File Prior to Visit  Medication Sig Dispense Refill  . amLODipine (NORVASC) 10 MG tablet Take 1 tablet (10 mg total) by mouth daily. (NEEDS TO BE SEEN BEFORE NEXT REFILL) 30 tablet 0  . aspirin 325 MG tablet Take 1 tablet (325 mg total) by mouth daily.    Marland Kitchen atorvastatin (LIPITOR) 20 MG tablet Take 1 tablet by mouth once daily 90 tablet 1  . Cholecalciferol (VITAMIN D3) 5000 UNITS CAPS Take 1 capsule by mouth daily.    . Elastic Bandages & Supports (V-2 HIGH COMPRESSION HOSE) MISC Wear daily when up walking around 1 each 0  . FLAXSEED, LINSEED, PO Take 30 mLs by mouth every morning. GRINDS FLAX SEEDS AND PUTS IN 1/4 CUP OF WATER    . GARLIC PO Take 2  capsules by mouth daily.    Marland Kitchen levothyroxine (EUTHYROX) 88 MCG tablet Take 1 tablet (88 mcg total) by mouth daily before breakfast. 90 tablet 3  . lisinopril (ZESTRIL) 10 MG tablet Take 1 tablet by mouth once daily 90  tablet 1  . Magnesium 300 MG CAPS Take 1 capsule by mouth daily.    . Multiple Vitamins-Minerals (CENTRUM SILVER ULTRA MENS PO) Take 1 tablet by mouth daily.    . Probiotic Product (PROBIOTIC DAILY PO) Take 1 capsule by mouth daily.    Marland Kitchen Leuprolide Acetate, 6 Month, (LUPRON) 45 MG injection Inject 45 mg into the muscle every 6 (six) months.  (Patient not taking: No sig reported)     No current facility-administered medications on file prior to visit.    ROS Review of Systems  Constitutional: Negative for fever.  Respiratory: Negative for shortness of breath.   Cardiovascular: Negative for chest pain.  Musculoskeletal: Negative for arthralgias.  Skin: Negative for rash.  Neurological: Positive for dizziness and light-headedness.    Objective:  BP (!) 143/67   Pulse 70   Temp (!) 97.1 F (36.2 C)   Ht _0  (1.803 m)   Wt 237 lb (107.5 kg)   SpO2 99%   BMI 33.05 kg/m   BP Readings from Last 3 Encounters:  05/08/20 (!) 143/67  11/08/19 138/68  05/09/19 (!) 155/66    Wt Readings from Last 3 Encounters:  05/08/20 237 lb (107.5 kg)  11/08/19 237 lb 12.8 oz (107.9 kg)  05/09/19 239 lb 8 oz (108.6 kg)     Physical Exam Vitals reviewed.  Constitutional:      Appearance: He is well-developed.  HENT:     Head: Normocephalic and atraumatic.     Right Ear: External ear normal.     Left Ear: External ear normal.     Mouth/Throat:     Pharynx: No oropharyngeal exudate or posterior oropharyngeal erythema.  Eyes:     Pupils: Pupils are equal, round, and reactive to light.  Cardiovascular:     Rate and Rhythm: Normal rate and regular rhythm.     Heart sounds: No murmur heard.   Pulmonary:     Effort: No respiratory distress.     Breath sounds: Normal breath sounds.  Musculoskeletal:     Cervical back: Normal range of motion and neck supple.  Neurological:     Mental Status: He is alert and oriented to person, place, and time.       Assessment & Plan:   Hershall  was seen today for hyperlipidemia, hypertension and hypothyroidism.  Diagnoses and all orders for this visit:  Primary hypertension -     CBC with Differential/Platelet -     CMP14+EGFR -     Lipid panel  Mixed hyperlipidemia -     CBC with Differential/Platelet -     CMP14+EGFR -     Lipid panel  Metabolic syndrome -     CBC with Differential/Platelet -     CMP14+EGFR -     Lipid panel  Dizziness  Acquired hypothyroidism  Multiple lacunar infarcts (HCC)  Obesity (BMI 30.0-34.9)  Other orders -     metoprolol succinate (TOPROL-XL) 25 MG 24 hr tablet; TAKE ONE DAILY FOR TEN DAYS. tHEN TAKE 1/2 DAILY FOR TEN DAYS, THEN DC THE MEDICATION COMPLETELY For blood pressure control   Allergies as of 05/08/2020      Reactions   Caffeine Palpitations   Codeine Palpitations  Medication List       Accurate as of May 08, 2020 11:59 PM. If you have any questions, ask your nurse or doctor.        amLODipine 10 MG tablet Commonly known as: NORVASC Take 1 tablet (10 mg total) by mouth daily. (NEEDS TO BE SEEN BEFORE NEXT REFILL)   aspirin 325 MG tablet Take 1 tablet (325 mg total) by mouth daily.   atorvastatin 20 MG tablet Commonly known as: LIPITOR Take 1 tablet by mouth once daily   CENTRUM SILVER ULTRA MENS PO Take 1 tablet by mouth daily.   FLAXSEED (LINSEED) PO Take 30 mLs by mouth every morning. GRINDS FLAX SEEDS AND PUTS IN 1/4 CUP OF WATER   GARLIC PO Take 2 capsules by mouth daily.   Leuprolide Acetate (6 Month) 45 MG injection Commonly known as: LUPRON Inject 45 mg into the muscle every 6 (six) months.   levothyroxine 88 MCG tablet Commonly known as: Euthyrox Take 1 tablet (88 mcg total) by mouth daily before breakfast.   lisinopril 10 MG tablet Commonly known as: ZESTRIL Take 1 tablet by mouth once daily   Magnesium 300 MG Caps Take 1 capsule by mouth daily.   metoprolol succinate 25 MG 24 hr tablet Commonly known as: TOPROL-XL TAKE ONE  DAILY FOR TEN DAYS. tHEN TAKE 1/2 DAILY FOR TEN DAYS, THEN DC THE MEDICATION COMPLETELY For blood pressure control What changed:   medication strength  how much to take  how to take this  when to take this  additional instructions Changed by: Claretta Fraise, MD   PROBIOTIC DAILY PO Take 1 capsule by mouth daily.   V-2 High Compression Hose Misc Wear daily when up walking around   Vitamin D3 125 MCG (5000 UT) Caps Take 1 capsule by mouth daily.       Meds ordered this encounter  Medications  . metoprolol succinate (TOPROL-XL) 25 MG 24 hr tablet    Sig: TAKE ONE DAILY FOR TEN DAYS. tHEN TAKE 1/2 DAILY FOR TEN DAYS, THEN DC THE MEDICATION COMPLETELY For blood pressure control    Dispense:  14 tablet    Refill:  0       Follow-up: Return in about 1 month (around 06/07/2020).  Claretta Fraise, M.D.

## 2020-05-09 LAB — CBC WITH DIFFERENTIAL/PLATELET
Basophils Absolute: 0 10*3/uL (ref 0.0–0.2)
Basos: 1 %
EOS (ABSOLUTE): 0.2 10*3/uL (ref 0.0–0.4)
Eos: 4 %
Hematocrit: 46.8 % (ref 37.5–51.0)
Hemoglobin: 15.3 g/dL (ref 13.0–17.7)
Immature Grans (Abs): 0 10*3/uL (ref 0.0–0.1)
Immature Granulocytes: 0 %
Lymphocytes Absolute: 1.7 10*3/uL (ref 0.7–3.1)
Lymphs: 27 %
MCH: 30.7 pg (ref 26.6–33.0)
MCHC: 32.7 g/dL (ref 31.5–35.7)
MCV: 94 fL (ref 79–97)
Monocytes Absolute: 0.6 10*3/uL (ref 0.1–0.9)
Monocytes: 10 %
Neutrophils Absolute: 3.7 10*3/uL (ref 1.4–7.0)
Neutrophils: 58 %
Platelets: 233 10*3/uL (ref 150–450)
RBC: 4.98 x10E6/uL (ref 4.14–5.80)
RDW: 12.6 % (ref 11.6–15.4)
WBC: 6.3 10*3/uL (ref 3.4–10.8)

## 2020-05-09 LAB — CMP14+EGFR
ALT: 21 IU/L (ref 0–44)
AST: 22 IU/L (ref 0–40)
Albumin/Globulin Ratio: 1.8 (ref 1.2–2.2)
Albumin: 4.4 g/dL (ref 3.6–4.6)
Alkaline Phosphatase: 66 IU/L (ref 44–121)
BUN/Creatinine Ratio: 15 (ref 10–24)
BUN: 14 mg/dL (ref 8–27)
Bilirubin Total: 0.5 mg/dL (ref 0.0–1.2)
CO2: 23 mmol/L (ref 20–29)
Calcium: 9.6 mg/dL (ref 8.6–10.2)
Chloride: 100 mmol/L (ref 96–106)
Creatinine, Ser: 0.93 mg/dL (ref 0.76–1.27)
Globulin, Total: 2.4 g/dL (ref 1.5–4.5)
Glucose: 94 mg/dL (ref 65–99)
Potassium: 4.9 mmol/L (ref 3.5–5.2)
Sodium: 137 mmol/L (ref 134–144)
Total Protein: 6.8 g/dL (ref 6.0–8.5)
eGFR: 80 mL/min/{1.73_m2} (ref >59)

## 2020-05-09 LAB — LIPID PANEL
Chol/HDL Ratio: 2.3 ratio (ref 0.0–5.0)
Cholesterol, Total: 130 mg/dL (ref 100–199)
HDL: 56 mg/dL (ref >39)
LDL Chol Calc (NIH): 50 mg/dL (ref 0–99)
Triglycerides: 141 mg/dL (ref 0–149)
VLDL Cholesterol Cal: 24 mg/dL (ref 5–40)

## 2020-05-12 ENCOUNTER — Encounter: Payer: Self-pay | Admitting: Family Medicine

## 2020-05-12 NOTE — Progress Notes (Signed)
Hello Deegan,  Your lab result is normal and/or stable.Some minor variations that are not significant are commonly marked abnormal, but do not represent any medical problem for you.  Best regards, Claretta Fraise, M.D.

## 2020-05-27 ENCOUNTER — Other Ambulatory Visit: Payer: Self-pay | Admitting: Family Medicine

## 2020-06-12 ENCOUNTER — Other Ambulatory Visit: Payer: Self-pay

## 2020-06-12 ENCOUNTER — Encounter: Payer: Self-pay | Admitting: Family Medicine

## 2020-06-12 ENCOUNTER — Ambulatory Visit (INDEPENDENT_AMBULATORY_CARE_PROVIDER_SITE_OTHER): Payer: Medicare Other | Admitting: Family Medicine

## 2020-06-12 VITALS — BP 167/69 | HR 65 | Temp 96.9°F | Ht 71.0 in | Wt 237.2 lb

## 2020-06-12 DIAGNOSIS — R42 Dizziness and giddiness: Secondary | ICD-10-CM | POA: Diagnosis not present

## 2020-06-12 DIAGNOSIS — I6930 Unspecified sequelae of cerebral infarction: Secondary | ICD-10-CM

## 2020-06-12 DIAGNOSIS — I6381 Other cerebral infarction due to occlusion or stenosis of small artery: Secondary | ICD-10-CM

## 2020-06-12 DIAGNOSIS — I10 Essential (primary) hypertension: Secondary | ICD-10-CM

## 2020-06-12 MED ORDER — LISINOPRIL 20 MG PO TABS: 20.0000 mg | ORAL_TABLET | Freq: Every day | ORAL | 3 refills | Status: DC

## 2020-06-12 NOTE — Progress Notes (Signed)
Subjective:  Patient ID: [name], male    DOB: 1932/04/22  Age: 87 y.o. MRN: [id number]  CC: Medical Management of Chronic Issues   HPI [name] presents for  follow-up of hypertension. Patient has no history of headache chest pain or shortness of breath or recent cough. Patient also denies symptoms of TIA such as focal numbness or weakness.Still some dizziness after DC of  Metoprolol. Off balance. No improvement with DC. History [name] has a past medical history of Aphasia, Arthritis, BNC (bladder neck contracture), BPPV (benign paroxysmal positional vertigo), Cataract, Diverticulosis, H/O diarrhea (SECONDARY TO RADIATION THERAPY --  INTERMITANT  DIARRHEA), Hematuria, History of hemorrhagic cystitis, History of prostate cancer (CURRENTLY  ELEVATED PSA--  LUPRON INJECTIONS), Hyperlipidemia, Hypertension, Hypothyroidism, Radiation cystitis, Radiation proctitis, Urinary incontinence, Urinary retention, and Vitamin D deficiency.   He has a past surgical history that includes Prostatectomy (1997); Colonoscopy w/ polypectomy (2005; 08/11/2010); CYSTO/ FULGERATION OF BLEEDERS/ RESECTION BLADDER NECK CONTRACTURE (07-06-2004); Cataract extraction w/ intraocular lens implant (Right); Lumbar disc surgery (1996); Transurethral resection of bladder neck (N/A, 06/23/2012); Transurethral resection of bladder tumor with gyrus (turbt-gyrus) (N/A, 06/01/2013); Eye surgery; Spine surgery; and Knee arthroscopy (Right).   His family history includes Alzheimer's disease in his brother; Appendicitis (age of onset: 27) in his sister; CVA (age of onset: 32) in his mother; Cancer in his sister and sister; Congestive Heart Failure in his mother; Early death in his sister; Hip fracture in his brother; Kidney disease in his brother and sister; Obesity in his sister; Pancreatitis in his father; Pneumonia in his father; Prostate cancer in his brother.He reports that he quit smoking about 57 years ago. His smoking use  included cigarettes. He quit after 20.00 years of use. He quit smokeless tobacco use about 57 years ago.  His smokeless tobacco use included chew. He reports that he does not drink alcohol and does not use drugs.  Current Outpatient Medications on File Prior to Visit  Medication Sig Dispense Refill  . amLODipine (NORVASC) 10 MG tablet Take 1 tablet (10 mg total) by mouth daily. (NEEDS TO BE SEEN BEFORE NEXT REFILL) 30 tablet 0  . aspirin 325 MG tablet Take 1 tablet (325 mg total) by mouth daily.    Marland Kitchen atorvastatin (LIPITOR) 20 MG tablet Take 1 tablet by mouth once daily 90 tablet 1  . Cholecalciferol (VITAMIN D3) 5000 UNITS CAPS Take 1 capsule by mouth daily.    . Elastic Bandages & Supports (V-2 HIGH COMPRESSION HOSE) MISC Wear daily when up walking around 1 each 0  . EUTHYROX 88 MCG tablet TAKE 1 TABLET BY MOUTH ONCE DAILY BEFORE BREAKFAST 90 tablet 1  . FLAXSEED, LINSEED, PO Take 30 mLs by mouth every morning. GRINDS FLAX SEEDS AND PUTS IN 1/4 CUP OF WATER    . GARLIC PO Take 2 capsules by mouth daily.    Marland Kitchen Leuprolide Acetate, 6 Month, (LUPRON) 45 MG injection Inject 45 mg into the muscle every 6 (six) months.    . Magnesium 300 MG CAPS Take 1 capsule by mouth daily.    . Multiple Vitamins-Minerals (CENTRUM SILVER ULTRA MENS PO) Take 1 tablet by mouth daily.    . Probiotic Product (PROBIOTIC DAILY PO) Take 1 capsule by mouth daily.     No current facility-administered medications on file prior to visit.    ROS Review of Systems  Constitutional: Negative for fever.  Respiratory: Negative for shortness of breath.   Cardiovascular: Negative for chest pain.  Musculoskeletal: Negative  for arthralgias.  Skin: Negative for rash.    Objective:  BP (!) 167/69   Pulse 65   Temp (!) 96.9 F (36.1 C)   Ht 5\' 11"  (1.803 m)   Wt 237 lb 3.2 oz (107.6 kg)   SpO2 99%   BMI 33.08 kg/m   BP Readings from Last 3 Encounters:  06/12/20 (!) 167/69  05/08/20 (!) 143/67  11/08/19 138/68    Wt  Readings from Last 3 Encounters:  06/12/20 237 lb 3.2 oz (107.6 kg)  05/08/20 237 lb (107.5 kg)  11/08/19 237 lb 12.8 oz (107.9 kg)     Physical Exam Vitals reviewed.  Constitutional:      Appearance: He is well-developed.  HENT:     Head: Normocephalic and atraumatic.     Right Ear: Tympanic membrane and external ear normal. No decreased hearing noted.     Left Ear: Tympanic membrane and external ear normal. No decreased hearing noted.     Mouth/Throat:     Pharynx: No oropharyngeal exudate or posterior oropharyngeal erythema.  Eyes:     Pupils: Pupils are equal, round, and reactive to light.  Cardiovascular:     Rate and Rhythm: Normal rate and regular rhythm.     Heart sounds: No murmur heard.   Pulmonary:     Effort: No respiratory distress.     Breath sounds: Normal breath sounds.  Abdominal:     General: Bowel sounds are normal.     Palpations: Abdomen is soft. There is no mass.     Tenderness: There is no abdominal tenderness.  Musculoskeletal:     Cervical back: Normal range of motion and neck supple.       Assessment & Plan:   [name] was seen today for medical management of chronic issues.  Diagnoses and all orders for this visit:  Dizziness -     MR Brain Wo Contrast; Future  Primary hypertension  Multiple lacunar infarcts ([place])  Other orders -     lisinopril (ZESTRIL) 20 MG tablet; Take 1 tablet (20 mg total) by mouth daily.   Allergies as of 06/12/2020      Reactions   Caffeine Palpitations   Codeine Palpitations      Medication List       Accurate as of Jun 12, 2020  8:53 AM. If you have any questions, ask your nurse or doctor.        STOP taking these medications   metoprolol succinate 25 MG 24 hr tablet Commonly known as: TOPROL-XL Stopped by: Claretta Fraise, MD     TAKE these medications   amLODipine 10 MG tablet Commonly known as: NORVASC Take 1 tablet (10 mg total) by mouth daily. (NEEDS TO BE SEEN BEFORE NEXT REFILL)    aspirin 325 MG tablet Take 1 tablet (325 mg total) by mouth daily.   atorvastatin 20 MG tablet Commonly known as: LIPITOR Take 1 tablet by mouth once daily   CENTRUM SILVER ULTRA MENS PO Take 1 tablet by mouth daily.   Euthyrox 88 MCG tablet Generic drug: levothyroxine TAKE 1 TABLET BY MOUTH ONCE DAILY BEFORE BREAKFAST   FLAXSEED (LINSEED) PO Take 30 mLs by mouth every morning. GRINDS FLAX SEEDS AND PUTS IN 1/4 CUP OF WATER   GARLIC PO Take 2 capsules by mouth daily.   Leuprolide Acetate (6 Month) 45 MG injection Commonly known as: LUPRON Inject 45 mg into the muscle every 6 (six) months.   lisinopril 20 MG tablet Commonly known as:  ZESTRIL Take 1 tablet (20 mg total) by mouth daily. What changed:   medication strength  how much to take Changed by: Claretta Fraise, MD   Magnesium 300 MG Caps Take 1 capsule by mouth daily.   PROBIOTIC DAILY PO Take 1 capsule by mouth daily.   V-2 High Compression Hose Misc Wear daily when up walking around   Vitamin D3 125 MCG (5000 UT) Caps Take 1 capsule by mouth daily.       Meds ordered this encounter  Medications  . lisinopril (ZESTRIL) 20 MG tablet    Sig: Take 1 tablet (20 mg total) by mouth daily.    Dispense:  90 tablet    Refill:  3      Follow-up: Return in about 3 months (around 09/12/2020).  Claretta Fraise, M.D.

## 2020-06-17 ENCOUNTER — Telehealth: Payer: Self-pay | Admitting: Family Medicine

## 2020-06-17 NOTE — Telephone Encounter (Signed)
Pt calling back about MRI

## 2020-06-30 ENCOUNTER — Ambulatory Visit (HOSPITAL_COMMUNITY)
Admission: RE | Admit: 2020-06-30 | Discharge: 2020-06-30 | Disposition: A | Discharge status: Home / Self Care | Payer: Medicare Other | Source: Ambulatory Visit | Attending: Family Medicine | Admitting: Family Medicine

## 2020-06-30 ENCOUNTER — Ambulatory Visit (HOSPITAL_COMMUNITY): Payer: Medicare Other

## 2020-06-30 ENCOUNTER — Other Ambulatory Visit: Payer: Self-pay

## 2020-06-30 DIAGNOSIS — R42 Dizziness and giddiness: Secondary | ICD-10-CM | POA: Diagnosis not present

## 2020-06-30 DIAGNOSIS — G319 Degenerative disease of nervous system, unspecified: Secondary | ICD-10-CM | POA: Diagnosis not present

## 2020-07-01 ENCOUNTER — Other Ambulatory Visit: Payer: Self-pay | Admitting: Family Medicine

## 2020-07-01 MED ORDER — PREDNISONE 10 MG PO TABS: ORAL_TABLET | ORAL | 0 refills | Status: DC

## 2020-07-01 MED ORDER — AMOXICILLIN-POT CLAVULANATE 875-125 MG PO TABS: 1.0000 | ORAL_TABLET | Freq: Two times a day (BID) | ORAL | 0 refills | Status: DC

## 2020-07-15 DIAGNOSIS — B351 Tinea unguium: Secondary | ICD-10-CM | POA: Diagnosis not present

## 2020-07-15 DIAGNOSIS — M79673 Pain in unspecified foot: Secondary | ICD-10-CM | POA: Diagnosis not present

## 2020-07-15 DIAGNOSIS — L84 Corns and callosities: Secondary | ICD-10-CM | POA: Diagnosis not present

## 2020-07-15 DIAGNOSIS — I70203 Unspecified atherosclerosis of native arteries of extremities, bilateral legs: Secondary | ICD-10-CM | POA: Diagnosis not present

## 2020-07-29 DIAGNOSIS — C61 Malignant neoplasm of prostate: Secondary | ICD-10-CM | POA: Diagnosis not present

## 2020-07-29 DIAGNOSIS — Z192 Hormone resistant malignancy status: Secondary | ICD-10-CM | POA: Diagnosis not present

## 2020-07-30 ENCOUNTER — Telehealth: Payer: Self-pay | Admitting: Family Medicine

## 2020-07-30 NOTE — Telephone Encounter (Signed)
Please call back about MRI results

## 2020-07-30 NOTE — Telephone Encounter (Signed)
Spoke with patient regarding mri

## 2020-08-04 DIAGNOSIS — C61 Malignant neoplasm of prostate: Secondary | ICD-10-CM | POA: Diagnosis not present

## 2020-09-10 ENCOUNTER — Encounter: Payer: Self-pay | Admitting: Family Medicine

## 2020-09-10 ENCOUNTER — Other Ambulatory Visit: Payer: Self-pay

## 2020-09-10 ENCOUNTER — Ambulatory Visit (INDEPENDENT_AMBULATORY_CARE_PROVIDER_SITE_OTHER): Payer: Medicare Other | Admitting: Family Medicine

## 2020-09-10 VITALS — BP 178/71 | HR 67 | Temp 98.0°F | Ht 71.0 in | Wt 241.2 lb

## 2020-09-10 DIAGNOSIS — Z8546 Personal history of malignant neoplasm of prostate: Secondary | ICD-10-CM

## 2020-09-10 DIAGNOSIS — E039 Hypothyroidism, unspecified: Secondary | ICD-10-CM | POA: Diagnosis not present

## 2020-09-10 DIAGNOSIS — H9193 Unspecified hearing loss, bilateral: Secondary | ICD-10-CM

## 2020-09-10 DIAGNOSIS — I700 Atherosclerosis of aorta: Secondary | ICD-10-CM | POA: Diagnosis not present

## 2020-09-10 DIAGNOSIS — I10 Essential (primary) hypertension: Secondary | ICD-10-CM | POA: Diagnosis not present

## 2020-09-10 DIAGNOSIS — I6381 Other cerebral infarction due to occlusion or stenosis of small artery: Secondary | ICD-10-CM | POA: Diagnosis not present

## 2020-09-10 DIAGNOSIS — E782 Mixed hyperlipidemia: Secondary | ICD-10-CM

## 2020-09-10 DIAGNOSIS — R42 Dizziness and giddiness: Secondary | ICD-10-CM | POA: Diagnosis not present

## 2020-09-10 DIAGNOSIS — M170 Bilateral primary osteoarthritis of knee: Secondary | ICD-10-CM | POA: Diagnosis not present

## 2020-09-10 MED ORDER — ATORVASTATIN CALCIUM 40 MG PO TABS: 40.0000 mg | ORAL_TABLET | Freq: Every day | ORAL | 1 refills | Status: DC

## 2020-09-10 MED ORDER — AMLODIPINE BESYLATE 10 MG PO TABS: 10.0000 mg | ORAL_TABLET | Freq: Every day | ORAL | 3 refills | Status: DC

## 2020-09-10 MED ORDER — NABUMETONE 500 MG PO TABS: 500.0000 mg | ORAL_TABLET | Freq: Two times a day (BID) | ORAL | 2 refills | Status: DC

## 2020-09-10 NOTE — Progress Notes (Signed)
Subjective:  Patient ID: [name], male    DOB: 1932-02-20  Age: 87 y.o. MRN: [id number]  CC: Medical Management of Chronic Issues   HPI [name] presents for  follow-up of hypertension. Patient has no history of headache chest pain or shortness of breath or recent cough. Patient also denies symptoms of TIA such as focal numbness or weakness. Patient denies side effects from medication. States taking it regularly.  Blood pressure today is quite high.  Patient's wife is here and feels it is due to his cholesterol medicine and wants to cut back on the dose.  However he also brings in a report of a CT of his abdomen that shows aortic atherosclerosis and increased calcium in the coronary arteries.  The latest studies show that high-dose of the atorvastatin would be appropriate in his case.  This is particularly true due to his previous stroke.  He continues to have some weakness and walks with a cane as a result that in addition to his arthritis. Patient today is concerned that he has been up since 130 this morning due to pain in the right knee.  He is having dizziness daily.  It seems to be worse today.  Not sure exactly when this started.  His wife relates it back to his sinus infection treated recently see the chart for that.  Since he is still having dizziness she would like to cut back on his medications and increase his blood pressure medication today because she is concerned that dizziness is the result of high blood pressure.  She would also like further information about the sinuses.   History Chi has a past medical history of Aphasia, Arthritis, BNC (bladder neck contracture), BPPV (benign paroxysmal positional vertigo), Cataract, Diverticulosis, H/O diarrhea (SECONDARY TO RADIATION THERAPY --  INTERMITANT  DIARRHEA), Hematuria, History of hemorrhagic cystitis, History of prostate cancer (CURRENTLY  ELEVATED PSA--  LUPRON INJECTIONS), Hyperlipidemia, Hypertension, Hypothyroidism,  Radiation cystitis, Radiation proctitis, Urinary incontinence, Urinary retention, and Vitamin D deficiency.   He has a past surgical history that includes Prostatectomy (1997); Colonoscopy w/ polypectomy (2005; 08/11/2010); CYSTO/ FULGERATION OF BLEEDERS/ RESECTION BLADDER NECK CONTRACTURE (07-06-2004); Cataract extraction w/ intraocular lens implant (Right); Lumbar disc surgery (1996); Transurethral resection of bladder neck (N/A, 06/23/2012); Transurethral resection of bladder tumor with gyrus (turbt-gyrus) (N/A, 06/01/2013); Eye surgery; Spine surgery; and Knee arthroscopy (Right).   His family history includes Alzheimer's disease in his brother; Appendicitis (age of onset: 43) in his sister; CVA (age of onset: 39) in his mother; Cancer in his sister and sister; Congestive Heart Failure in his mother; Early death in his sister; Hip fracture in his brother; Kidney disease in his brother and sister; Obesity in his sister; Pancreatitis in his father; Pneumonia in his father; Prostate cancer in his brother.He reports that he quit smoking about 57 years ago. His smoking use included cigarettes. He quit smokeless tobacco use about 57 years ago.  His smokeless tobacco use included chew. He reports that he does not drink alcohol and does not use drugs.  Current Outpatient Medications on File Prior to Visit  Medication Sig Dispense Refill   aspirin 325 MG tablet Take 1 tablet (325 mg total) by mouth daily.     Cholecalciferol (VITAMIN D3) 5000 UNITS CAPS Take 1 capsule by mouth daily.     Elastic Bandages & Supports (V-2 HIGH COMPRESSION HOSE) MISC Wear daily when up walking around 1 each 0   EUTHYROX 88 MCG tablet TAKE 1 TABLET  BY MOUTH ONCE DAILY BEFORE BREAKFAST 90 tablet 1   FLAXSEED, LINSEED, PO Take 30 mLs by mouth every morning. GRINDS FLAX SEEDS AND PUTS IN 1/4 CUP OF WATER     GARLIC PO Take 2 capsules by mouth daily.     Leuprolide Acetate, 6 Month, (LUPRON) 45 MG injection Inject 45 mg into the muscle  every 6 (six) months.     lisinopril (ZESTRIL) 20 MG tablet Take 1 tablet (20 mg total) by mouth daily. 90 tablet 3   Magnesium 300 MG CAPS Take 1 capsule by mouth daily.     Multiple Vitamins-Minerals (CENTRUM SILVER ULTRA MENS PO) Take 1 tablet by mouth daily.     Probiotic Product (PROBIOTIC DAILY PO) Take 1 capsule by mouth daily.     No current facility-administered medications on file prior to visit.    ROS Review of Systems  Constitutional:  Negative for fever.  Respiratory:  Negative for shortness of breath.   Cardiovascular:  Negative for chest pain.  Musculoskeletal:  Positive for arthralgias (right knee).  Skin:  Negative for rash.   Objective:  BP (!) 178/71   Pulse 67   Temp 98 F (36.7 C)   Ht _0  (1.803 m)   Wt 241 lb 3.2 oz (109.4 kg)   SpO2 97%   BMI 33.64 kg/m   BP Readings from Last 3 Encounters:  09/10/20 (!) 178/71  06/12/20 (!) 167/69  05/08/20 (!) 143/67    Wt Readings from Last 3 Encounters:  09/10/20 241 lb 3.2 oz (109.4 kg)  06/12/20 237 lb 3.2 oz (107.6 kg)  05/08/20 237 lb (107.5 kg)     Physical Exam Constitutional:      General: He is not in acute distress.    Appearance: He is well-developed.  HENT:     Head: Normocephalic and atraumatic.     Right Ear: External ear normal.     Left Ear: External ear normal.     Nose: Nose normal.  Eyes:     Conjunctiva/sclera: Conjunctivae normal.     Pupils: Pupils are equal, round, and reactive to light.  Cardiovascular:     Rate and Rhythm: Normal rate and regular rhythm.     Heart sounds: Normal heart sounds. No murmur heard. Pulmonary:     Effort: Pulmonary effort is normal. No respiratory distress.     Breath sounds: Normal breath sounds. No wheezing or rales.  Abdominal:     Palpations: Abdomen is soft.     Tenderness: There is no abdominal tenderness.  Musculoskeletal:        General: Normal range of motion.     Cervical back: Normal range of motion and neck supple.  Skin:     General: Skin is warm and dry.  Neurological:     Mental Status: He is alert and oriented to person, place, and time.     Deep Tendon Reflexes: Reflexes are normal and symmetric.  Psychiatric:        Behavior: Behavior normal.        Thought Content: Thought content normal.        Judgment: Judgment normal.      Assessment & Plan:   Azaria was seen today for medical management of chronic issues.  Diagnoses and all orders for this visit:  Dizziness -     T3, reverse -     Cancel: Free T3 by Dialysis with T3 Total -     TSH + free T4 -  CT MAXILLOFACIAL WO CONTRAST; Future  Essential hypertension -     amLODipine (NORVASC) 10 MG tablet; Take 1 tablet (10 mg total) by mouth daily. -     T3, reverse -     Cancel: Free T3 by Dialysis with T3 Total -     TSH + free T4 -     CT MAXILLOFACIAL WO CONTRAST; Future  Primary hypertension -     T3, reverse -     Cancel: Free T3 by Dialysis with T3 Total -     TSH + free T4 -     CT MAXILLOFACIAL WO CONTRAST; Future  Acquired hypothyroidism -     T3, reverse -     Cancel: Free T3 by Dialysis with T3 Total -     TSH + free T4 -     CT MAXILLOFACIAL WO CONTRAST; Future -     T3, Free; Future -     T3, Free  Aortic atherosclerosis (HCC)  H/O prostate cancer  Mixed hyperlipidemia  Arthritis of both knees  Hearing deficit, bilateral  Other orders -     atorvastatin (LIPITOR) 40 MG tablet; Take 1 tablet (40 mg total) by mouth daily. -     nabumetone (RELAFEN) 500 MG tablet; Take 1 tablet (500 mg total) by mouth 2 (two) times daily.  Allergies as of 09/10/2020       Reactions   Caffeine Palpitations   Codeine Palpitations        Medication List        Accurate as of September 10, 2020 11:59 PM. If you have any questions, ask your nurse or doctor.          STOP taking these medications    amoxicillin-clavulanate 875-125 MG tablet Commonly known as: AUGMENTIN Stopped by: Claretta Fraise, MD   predniSONE 10 MG  tablet Commonly known as: DELTASONE Stopped by: Claretta Fraise, MD       TAKE these medications    amLODipine 10 MG tablet Commonly known as: NORVASC Take 1 tablet (10 mg total) by mouth daily. What changed: additional instructions Changed by: Claretta Fraise, MD   aspirin 325 MG tablet Take 1 tablet (325 mg total) by mouth daily.   atorvastatin 40 MG tablet Commonly known as: LIPITOR Take 1 tablet (40 mg total) by mouth daily. What changed:  medication strength how much to take Changed by: Claretta Fraise, MD   CENTRUM SILVER ULTRA MENS PO Take 1 tablet by mouth daily.   Euthyrox 88 MCG tablet Generic drug: levothyroxine TAKE 1 TABLET BY MOUTH ONCE DAILY BEFORE BREAKFAST   FLAXSEED (LINSEED) PO Take 30 mLs by mouth every morning. GRINDS FLAX SEEDS AND PUTS IN 1/4 CUP OF WATER   GARLIC PO Take 2 capsules by mouth daily.   Leuprolide Acetate (6 Month) 45 MG injection Commonly known as: LUPRON Inject 45 mg into the muscle every 6 (six) months.   lisinopril 20 MG tablet Commonly known as: ZESTRIL Take 1 tablet (20 mg total) by mouth daily.   Magnesium 300 MG Caps Take 1 capsule by mouth daily.   nabumetone 500 MG tablet Commonly known as: Relafen Take 1 tablet (500 mg total) by mouth 2 (two) times daily. Started by: Claretta Fraise, MD   PROBIOTIC DAILY PO Take 1 capsule by mouth daily.   V-2 High Compression Hose Misc Wear daily when up walking around   Vitamin D3 125 MCG (5000 UT) Caps Take 1 capsule by mouth daily.  Meds ordered this encounter  Medications   amLODipine (NORVASC) 10 MG tablet    Sig: Take 1 tablet (10 mg total) by mouth daily.    Dispense:  90 tablet    Refill:  3   atorvastatin (LIPITOR) 40 MG tablet    Sig: Take 1 tablet (40 mg total) by mouth daily.    Dispense:  90 tablet    Refill:  1   nabumetone (RELAFEN) 500 MG tablet    Sig: Take 1 tablet (500 mg total) by mouth 2 (two) times daily.    Dispense:  60 tablet     Refill:  2    We had a very lengthy discussion regarding his stroke risk and the need to control risk factors including cholesterol and aortic atherosclerosis with his Lipitor.  If in fact that does raise his blood pressure the blood pressure should be managed with blood pressure medication, not cutting back on his cholesterol medicine.  It is much more likely that his pressure is up today due to his lack of sleep and arthritis.  I would like to manage that with nabumetone since it is considered one of the safest NSAIDs and reviewed his kidney function shows excellent EGFR.  It is possible that his thyroid is overactive.  Due to his request we will I will go ahead with the reverse T3 and free T3 because of his concern that they have never been checked.  45 minutes was spent with this patient, over half of this time was in counseling regarding stroke prevention blood pressure cholesterol and thyroid management as well as controlling high blood pressure.  We discussed referral for hearing aid as well during that time.  Follow-up: Return in about 6 weeks (around 10/22/2020).  Claretta Fraise, M.D.

## 2020-09-11 LAB — TSH+FREE T4
Free T4: 1.74 ng/dL (ref 0.82–1.77)
TSH: 2.16 u[IU]/mL (ref 0.450–4.500)

## 2020-09-11 LAB — T3, FREE

## 2020-09-11 LAB — T3, REVERSE

## 2020-09-12 ENCOUNTER — Other Ambulatory Visit: Payer: Self-pay

## 2020-09-12 ENCOUNTER — Other Ambulatory Visit: Payer: Self-pay | Admitting: *Deleted

## 2020-09-12 ENCOUNTER — Other Ambulatory Visit: Payer: Medicare Other

## 2020-09-12 DIAGNOSIS — E039 Hypothyroidism, unspecified: Secondary | ICD-10-CM

## 2020-09-14 ENCOUNTER — Encounter: Payer: Self-pay | Admitting: Family Medicine

## 2020-09-15 ENCOUNTER — Other Ambulatory Visit: Payer: Self-pay

## 2020-09-15 ENCOUNTER — Ambulatory Visit (HOSPITAL_BASED_OUTPATIENT_CLINIC_OR_DEPARTMENT_OTHER)
Admission: RE | Admit: 2020-09-15 | Discharge: 2020-09-15 | Disposition: A | Discharge status: Home / Self Care | Payer: Medicare Other | Source: Ambulatory Visit | Attending: Family Medicine | Admitting: Family Medicine

## 2020-09-15 DIAGNOSIS — I10 Essential (primary) hypertension: Secondary | ICD-10-CM | POA: Diagnosis not present

## 2020-09-15 DIAGNOSIS — R519 Headache, unspecified: Secondary | ICD-10-CM | POA: Diagnosis not present

## 2020-09-15 DIAGNOSIS — R42 Dizziness and giddiness: Secondary | ICD-10-CM | POA: Insufficient documentation

## 2020-09-15 DIAGNOSIS — E039 Hypothyroidism, unspecified: Secondary | ICD-10-CM | POA: Diagnosis not present

## 2020-09-16 ENCOUNTER — Other Ambulatory Visit: Payer: Self-pay | Admitting: Family Medicine

## 2020-09-16 DIAGNOSIS — I10 Essential (primary) hypertension: Secondary | ICD-10-CM

## 2020-09-16 LAB — T3, FREE: T3, Free: 2.7 pg/mL (ref 2.0–4.4)

## 2020-09-16 LAB — T3, REVERSE: Reverse T3, Serum: 25.2 ng/dL — ABNORMAL HIGH (ref 9.2–24.1)

## 2020-09-16 MED ORDER — AMOXICILLIN-POT CLAVULANATE 875-125 MG PO TABS: 1.0000 | ORAL_TABLET | Freq: Two times a day (BID) | ORAL | 0 refills | Status: AC

## 2020-09-16 MED ORDER — PREDNISONE 10 MG PO TABS: ORAL_TABLET | ORAL | 0 refills | Status: DC

## 2020-09-18 ENCOUNTER — Telehealth: Payer: Self-pay | Admitting: Family Medicine

## 2020-09-19 ENCOUNTER — Other Ambulatory Visit: Payer: Self-pay | Admitting: Family Medicine

## 2020-09-19 DIAGNOSIS — H903 Sensorineural hearing loss, bilateral: Secondary | ICD-10-CM

## 2020-09-24 ENCOUNTER — Ambulatory Visit (INDEPENDENT_AMBULATORY_CARE_PROVIDER_SITE_OTHER): Payer: Medicare Other | Admitting: Family Medicine

## 2020-09-24 ENCOUNTER — Encounter: Payer: Self-pay | Admitting: Family Medicine

## 2020-09-24 ENCOUNTER — Other Ambulatory Visit: Payer: Self-pay

## 2020-09-24 VITALS — BP 156/69 | HR 64 | Temp 97.6°F | Resp 20 | Ht 71.0 in | Wt 240.0 lb

## 2020-09-24 DIAGNOSIS — Z8546 Personal history of malignant neoplasm of prostate: Secondary | ICD-10-CM | POA: Diagnosis not present

## 2020-09-24 DIAGNOSIS — I6930 Unspecified sequelae of cerebral infarction: Secondary | ICD-10-CM

## 2020-09-24 DIAGNOSIS — J0120 Acute ethmoidal sinusitis, unspecified: Secondary | ICD-10-CM | POA: Diagnosis not present

## 2020-09-24 DIAGNOSIS — I10 Essential (primary) hypertension: Secondary | ICD-10-CM | POA: Diagnosis not present

## 2020-09-24 DIAGNOSIS — R42 Dizziness and giddiness: Secondary | ICD-10-CM | POA: Diagnosis not present

## 2020-09-24 NOTE — Progress Notes (Signed)
Subjective:  Patient ID: [name], male    DOB: 07-31-1932  Age: 87 y.o. MRN: [id number]  CC: No chief complaint on file.   HPI BONIFACE GOFFE presents for back pain better with TENS. Using pain med's with partial relief. Still getting dizzy, unsteady on feet. Doesn't use his walker. but his mind is clearer. Increased the lisinopril to 2 X 10 mg. Daily. Continues the amlodipine Not much sleep last night. Usual BP 979-480 systolic.   Recent CT of the brain showed significant ethmoid sinusitis which is the likely cause of his dizziness.  He has been taking antibiotics for that for little over a week now.  His dizziness is already somewhat better. Depression screen [facility] 2/9 09/24/2020 09/10/2020 09/10/2020  Decreased Interest 1 3 0  Down, Depressed, Hopeless 0 0 0  PHQ - 2 Score 1 3 0  Altered sleeping 0 1 -  Tired, decreased energy 1 0 -  Change in appetite 0 0 -  Feeling bad or failure about yourself  0 0 -  Trouble concentrating 0 0 -  Moving slowly or fidgety/restless 0 0 -  Suicidal thoughts 0 0 -  PHQ-9 Score 2 4 -  Difficult doing work/chores Somewhat difficult Not difficult at all -  Some recent data might be hidden    History Curlee has a past medical history of Aphasia, Arthritis, BNC (bladder neck contracture), BPPV (benign paroxysmal positional vertigo), Cataract, Diverticulosis, H/O diarrhea (SECONDARY TO RADIATION THERAPY --  INTERMITANT  DIARRHEA), Hematuria, History of hemorrhagic cystitis, History of prostate cancer (CURRENTLY  ELEVATED PSA--  LUPRON INJECTIONS), Hyperlipidemia, Hypertension, Hypothyroidism, Radiation cystitis, Radiation proctitis, Urinary incontinence, Urinary retention, and Vitamin D deficiency.   He has a past surgical history that includes Prostatectomy (1997); Colonoscopy w/ polypectomy (2005; 08/11/2010); CYSTO/ FULGERATION OF BLEEDERS/ RESECTION BLADDER NECK CONTRACTURE (07-06-2004); Cataract extraction w/ intraocular lens implant (Right);  Lumbar disc surgery (1996); Transurethral resection of bladder neck (N/A, 06/23/2012); Transurethral resection of bladder tumor with gyrus (turbt-gyrus) (N/A, 06/01/2013); Eye surgery; Spine surgery; and Knee arthroscopy (Right).   His family history includes Alzheimer's disease in his brother; Appendicitis (age of onset: 67) in his sister; CVA (age of onset: 78) in his mother; Cancer in his sister and sister; Congestive Heart Failure in his mother; Early death in his sister; Hip fracture in his brother; Kidney disease in his brother and sister; Obesity in his sister; Pancreatitis in his father; Pneumonia in his father; Prostate cancer in his brother.He reports that he quit smoking about 57 years ago. His smoking use included cigarettes. He quit smokeless tobacco use about 57 years ago.  His smokeless tobacco use included chew. He reports that he does not drink alcohol and does not use drugs.    ROS Review of Systems  Constitutional:  Negative for fever.  Respiratory:  Negative for shortness of breath.   Cardiovascular:  Negative for chest pain.  Musculoskeletal:  Negative for arthralgias.  Skin:  Negative for rash.  Neurological:  Positive for dizziness.   Objective:  BP (!) 156/69 (BP Location: Left Arm, Cuff Size: Large)   Pulse 64   Temp 97.6 F (36.4 C) (Temporal)   Resp 20   Ht '5\' 11"'  (1.803 m)   Wt 240 lb (108.9 kg)   SpO2 97%   BMI 33.47 kg/m   BP Readings from Last 3 Encounters:  09/24/20 (!) 156/69  09/10/20 (!) 178/71  06/12/20 (!) 167/69    Wt Readings from Last 3 Encounters:  09/24/20 240 lb (108.9 kg)  09/10/20 241 lb 3.2 oz (109.4 kg)  06/12/20 237 lb 3.2 oz (107.6 kg)     Physical Exam Vitals reviewed.  Constitutional:      Appearance: He is well-developed.  HENT:     Head: Normocephalic and atraumatic.     Right Ear: External ear normal.     Left Ear: External ear normal.     Mouth/Throat:     Pharynx: No oropharyngeal exudate or posterior oropharyngeal  erythema.  Eyes:     Pupils: Pupils are equal, round, and reactive to light.  Cardiovascular:     Rate and Rhythm: Normal rate and regular rhythm.     Heart sounds: No murmur heard. Pulmonary:     Effort: No respiratory distress.     Breath sounds: Normal breath sounds.  Musculoskeletal:     Cervical back: Normal range of motion and neck supple.  Neurological:     Mental Status: He is alert and oriented to person, place, and time.      Assessment & Plan:   Diagnoses and all orders for this visit:  Essential hypertension -     CBC with Differential/Platelet -     CMP14+EGFR  Dizziness -     CBC with Differential/Platelet -     CMP14+EGFR  H/O prostate cancer -     PSA, total and free -     CBC with Differential/Platelet -     CMP14+EGFR  Acute non-recurrent ethmoidal sinusitis      I am having [name] maintain his (FLAXSEED, LINSEED, PO), Leuprolide Acetate (6 Month), Vitamin D3, Magnesium, Probiotic Product (PROBIOTIC DAILY PO), aspirin, V-2 High Compression Hose, GARLIC PO, Multiple Vitamins-Minerals (CENTRUM SILVER ULTRA MENS PO), Euthyrox, lisinopril, amLODipine, atorvastatin, nabumetone, predniSONE, and amoxicillin-clavulanate.  Allergies as of 09/24/2020       Reactions   Caffeine Palpitations   Codeine Palpitations        Medication List        Accurate as of September 24, 2020  5:51 PM. If you have any questions, ask your nurse or doctor.          amLODipine 10 MG tablet Commonly known as: NORVASC Take 1 tablet (10 mg total) by mouth daily.   amoxicillin-clavulanate 875-125 MG tablet Commonly known as: AUGMENTIN Take 1 tablet by mouth 2 (two) times daily. Take all of this medication   aspirin 325 MG tablet Take 1 tablet (325 mg total) by mouth daily.   atorvastatin 40 MG tablet Commonly known as: LIPITOR Take 1 tablet (40 mg total) by mouth daily.   CENTRUM SILVER ULTRA MENS PO Take 1 tablet by mouth daily.   Euthyrox 88 MCG  tablet Generic drug: levothyroxine TAKE 1 TABLET BY MOUTH ONCE DAILY BEFORE BREAKFAST   FLAXSEED (LINSEED) PO Take 30 mLs by mouth every morning. GRINDS FLAX SEEDS AND PUTS IN 1/4 CUP OF WATER   GARLIC PO Take 2 capsules by mouth daily.   Leuprolide Acetate (6 Month) 45 MG injection Commonly known as: LUPRON Inject 45 mg into the muscle every 6 (six) months.   lisinopril 20 MG tablet Commonly known as: ZESTRIL Take 1 tablet (20 mg total) by mouth daily.   Magnesium 300 MG Caps Take 1 capsule by mouth daily.   nabumetone 500 MG tablet Commonly known as: Relafen Take 1 tablet (500 mg total) by mouth 2 (two) times daily.   predniSONE 10 MG tablet Commonly known as: DELTASONE Take 5 daily for  3 days followed by 4,3,2 and 1 for 3 days each.   PROBIOTIC DAILY PO Take 1 capsule by mouth daily.   V-2 High Compression Hose Misc Wear daily when up walking around   Vitamin D3 125 MCG (5000 UT) Caps Take 1 capsule by mouth daily.         Follow-up: Return in about 6 weeks (around 11/05/2020).  Claretta Fraise, M.D.

## 2020-09-25 LAB — CMP14+EGFR
ALT: 16 IU/L (ref 0–44)
AST: 14 IU/L (ref 0–40)
Albumin/Globulin Ratio: 1.9 (ref 1.2–2.2)
Albumin: 4.3 g/dL (ref 3.6–4.6)
Alkaline Phosphatase: 57 IU/L (ref 44–121)
BUN/Creatinine Ratio: 25 — ABNORMAL HIGH (ref 10–24)
BUN: 23 mg/dL (ref 8–27)
Bilirubin Total: 0.6 mg/dL (ref 0.0–1.2)
CO2: 22 mmol/L (ref 20–29)
Calcium: 10 mg/dL (ref 8.6–10.2)
Chloride: 101 mmol/L (ref 96–106)
Creatinine, Ser: 0.91 mg/dL (ref 0.76–1.27)
Globulin, Total: 2.3 g/dL (ref 1.5–4.5)
Glucose: 81 mg/dL (ref 65–99)
Potassium: 4.6 mmol/L (ref 3.5–5.2)
Sodium: 137 mmol/L (ref 134–144)
Total Protein: 6.6 g/dL (ref 6.0–8.5)
eGFR: 82 mL/min/{1.73_m2} (ref >59)

## 2020-09-25 LAB — CBC WITH DIFFERENTIAL/PLATELET
Basophils Absolute: 0 10*3/uL (ref 0.0–0.2)
Basos: 0 %
EOS (ABSOLUTE): 0.2 10*3/uL (ref 0.0–0.4)
Eos: 1 %
Hematocrit: 47 % (ref 37.5–51.0)
Hemoglobin: 15.3 g/dL (ref 13.0–17.7)
Immature Grans (Abs): 0.1 10*3/uL (ref 0.0–0.1)
Immature Granulocytes: 1 %
Lymphocytes Absolute: 3.3 10*3/uL — ABNORMAL HIGH (ref 0.7–3.1)
Lymphs: 29 %
MCH: 30.3 pg (ref 26.6–33.0)
MCHC: 32.6 g/dL (ref 31.5–35.7)
MCV: 93 fL (ref 79–97)
Monocytes Absolute: 1.1 10*3/uL — ABNORMAL HIGH (ref 0.1–0.9)
Monocytes: 10 %
Neutrophils Absolute: 6.6 10*3/uL (ref 1.4–7.0)
Neutrophils: 59 %
Platelets: 238 10*3/uL (ref 150–450)
RBC: 5.05 x10E6/uL (ref 4.14–5.80)
RDW: 13 % (ref 11.6–15.4)
WBC: 11.2 10*3/uL — ABNORMAL HIGH (ref 3.4–10.8)

## 2020-09-25 LAB — PSA, TOTAL AND FREE
PSA, Free Pct: 15.3 %
PSA, Free: 0.26 ng/mL
Prostate Specific Ag, Serum: 1.7 ng/mL (ref 0.0–4.0)

## 2020-09-29 NOTE — Progress Notes (Signed)
Hello [name],  Your lab result is normal and/or stable.Some minor variations that are not significant are commonly marked abnormal, but do not represent any medical problem for you.  Best regards, Claretta Fraise, M.D.

## 2020-10-13 DIAGNOSIS — D239 Other benign neoplasm of skin, unspecified: Secondary | ICD-10-CM | POA: Diagnosis not present

## 2020-10-13 DIAGNOSIS — L570 Actinic keratosis: Secondary | ICD-10-CM | POA: Diagnosis not present

## 2020-10-13 DIAGNOSIS — Z85820 Personal history of malignant melanoma of skin: Secondary | ICD-10-CM | POA: Diagnosis not present

## 2020-10-14 DIAGNOSIS — M79673 Pain in unspecified foot: Secondary | ICD-10-CM | POA: Diagnosis not present

## 2020-10-14 DIAGNOSIS — B351 Tinea unguium: Secondary | ICD-10-CM | POA: Diagnosis not present

## 2020-10-14 DIAGNOSIS — I70203 Unspecified atherosclerosis of native arteries of extremities, bilateral legs: Secondary | ICD-10-CM | POA: Diagnosis not present

## 2020-10-14 DIAGNOSIS — L84 Corns and callosities: Secondary | ICD-10-CM | POA: Diagnosis not present

## 2020-10-22 DIAGNOSIS — C61 Malignant neoplasm of prostate: Secondary | ICD-10-CM | POA: Diagnosis not present

## 2020-10-29 ENCOUNTER — Ambulatory Visit (INDEPENDENT_AMBULATORY_CARE_PROVIDER_SITE_OTHER): Payer: Medicare Other

## 2020-10-29 VITALS — Ht 71.0 in | Wt 240.0 lb

## 2020-10-29 DIAGNOSIS — Z0000 Encounter for general adult medical examination without abnormal findings: Secondary | ICD-10-CM | POA: Diagnosis not present

## 2020-10-29 NOTE — Progress Notes (Signed)
Subjective:   [name] is a 87 y.o. male who presents for Medicare Annual/Subsequent preventive examination.  Virtual Visit via Telephone Note  I connected with  [name] on 10/29/20 at  2:45 PM EDT by telephone and verified that I am speaking with the correct person using two identifiers.  Location: Patient: Home Provider: [facility] Persons participating in the virtual visit: patient/wife, [name]/Nurse Health Advisor   I discussed the limitations, risks, security and privacy concerns of performing an evaluation and management service by telephone and the availability of in person appointments. The patient expressed understanding and agreed to proceed.  Interactive audio and video telecommunications were attempted between this nurse and patient, however failed, due to patient having technical difficulties OR patient did not have access to video capability.  We continued and completed visit with audio only.  Some vital signs may be absent or patient reported.   Rodney Breon E Haylei Cobin, LPN   Review of Systems     Cardiac Risk Factors include: advanced age (>28men, >71 women);dyslipidemia;hypertension;male gender;obesity (BMI >30kg/m2);sedentary lifestyle;Other (see comment), Risk factor comments: hx of TIA, atherosclerosis, hx prostate cancer     Objective:    Today's Vitals   10/29/20 1455 10/29/20 1457  Weight: 240 lb (108.9 kg)   Height: 5\' 11"  (1.803 m)   PainSc:  3    Body mass index is 33.47 kg/m.  Advanced Directives 10/29/2020 02/06/2020 02/08/2019 01/30/2018 01/20/2017 12/14/2016 10/05/2016  Does Patient Have a Medical Advance Directive? Yes Yes Yes Yes Yes No No  Type of Paramedic of [place];Living will - [place];Living will [place];Living will [place];Living will - -  Does patient want to make changes to medical advance directive? - - No - Patient declined No - Patient declined No -  Patient declined - -  Copy of [place] in Chart? No - copy requested - No - copy requested No - copy requested No - copy requested - -  Would patient like information on creating a medical advance directive? - - - - - - -    Current Medications (verified) Outpatient Encounter Medications as of 10/29/2020  Medication Sig   amLODipine (NORVASC) 10 MG tablet Take 1 tablet (10 mg total) by mouth daily.   aspirin 325 MG tablet Take 1 tablet (325 mg total) by mouth daily.   atorvastatin (LIPITOR) 40 MG tablet Take 1 tablet (40 mg total) by mouth daily.   Cholecalciferol (VITAMIN D3) 5000 UNITS CAPS Take 1 capsule by mouth daily.   Elastic Bandages & Supports (V-2 HIGH COMPRESSION HOSE) MISC Wear daily when up walking around   EUTHYROX 88 MCG tablet TAKE 1 TABLET BY MOUTH ONCE DAILY BEFORE BREAKFAST   FLAXSEED, LINSEED, PO Take 30 mLs by mouth every morning. GRINDS FLAX SEEDS AND PUTS IN 1/4 CUP OF WATER   GARLIC PO Take 2 capsules by mouth daily.   Leuprolide Acetate, 6 Month, (LUPRON) 45 MG injection Inject 45 mg into the muscle every 6 (six) months.   lisinopril (ZESTRIL) 20 MG tablet Take 1 tablet (20 mg total) by mouth daily.   Magnesium 300 MG CAPS Take 1 capsule by mouth daily.   Multiple Vitamins-Minerals (CENTRUM SILVER ULTRA MENS PO) Take 1 tablet by mouth daily.   nabumetone (RELAFEN) 500 MG tablet Take 1 tablet (500 mg total) by mouth 2 (two) times daily.   predniSONE (DELTASONE) 10 MG tablet Take 5 daily for 3 days  followed by 4,3,2 and 1 for 3 days each.   Probiotic Product (PROBIOTIC DAILY PO) Take 1 capsule by mouth daily.   triamcinolone cream (KENALOG) 0.1 % Apply topically 2 (two) times daily.   No facility-administered encounter medications on file as of 10/29/2020.    Allergies (verified) Caffeine and Codeine   History: Past Medical History:  Diagnosis Date   Aphasia    Arthritis    BNC (bladder neck contracture)    BPPV (benign paroxysmal  positional vertigo)    Cataract    bilateral   Diverticulosis    H/O diarrhea SECONDARY TO RADIATION THERAPY --  INTERMITANT  DIARRHEA   Hematuria    History of hemorrhagic cystitis    SECONDARY RADIATION THERAPY 1997   History of prostate cancer CURRENTLY  ELEVATED PSA--  LUPRON INJECTIONS   S/P PROSTATECTOMY AND RADIATION THERAPY  1997   Hyperlipidemia    Hypertension    Hypothyroidism    Radiation cystitis    Radiation proctitis    Urinary incontinence    Urinary retention    Vitamin D deficiency    Past Surgical History:  Procedure Laterality Date   CATARACT EXTRACTION W/ INTRAOCULAR LENS IMPLANT Right    COLONOSCOPY W/ POLYPECTOMY  2005; 08/11/2010   2005: 1 cm hyperplastic sigmoid polyp, Dr. Wynetta Emery 2012: hyperplastic splenic flexure polyp -1 cm, diverticulosis, radiation proctitis, anal stenosis   CYSTO/ FULGERATION OF BLEEDERS/ RESECTION BLADDER NECK CONTRACTURE  07-06-2004   BNC AND RADIATION CYSTITIS   EYE SURGERY     KNEE ARTHROSCOPY Right    LUMBAR DISC SURGERY  1996   L4 -- L5   PROSTATECTOMY  1997   SPINE SURGERY     TRANSURETHRAL RESECTION OF BLADDER NECK N/A 06/23/2012   Procedure: TRANSURETHRAL RESECTION OF BLADDER NECK CONTRACTURE WITH GYRUS;  Surgeon: Claybon Jabs, MD;  Location: [place];  Service: Urology;  Laterality: N/A;   TRANSURETHRAL RESECTION OF BLADDER TUMOR WITH GYRUS (TURBT-GYRUS) N/A 06/01/2013   Procedure: TRANSURETHRAL RESECTION OF BLADDER NECK CONTRACTURE WITH GYRUS (TURBT-GYRUS) AND INJECTION OF KENALOG;  Surgeon: Claybon Jabs, MD;  Location: [place];  Service: Urology;  Laterality: N/A;   Family History  Problem Relation Age of Onset   Prostate cancer Brother    Kidney disease Brother    Congestive Heart Failure Mother    CVA Mother 84   Pancreatitis Father    Pneumonia Father    Cancer Sister        breast   Obesity Sister    Early death Sister        appendicitis   Appendicitis Sister 13        rupture   Cancer Sister        breast   Kidney disease Sister    Alzheimer's disease Brother    Hip fracture Brother    Social History   Socioeconomic History   Marital status: Married    Spouse name: [name]   Number of children: 2   Years of education: 12+   Highest education level: Some college, no degree  Occupational History   Occupation: Retired    Comment: Administrator, arts for [employer]  Tobacco Use   Smoking status: Former    Years: 20.00    Types: Cigarettes    Quit date: 1965    Years since quitting: 57.7   Smokeless tobacco: Former    Types: Chew    Quit date: 1965   Tobacco comments:  SMOKED AND CHEW TOBACCO FOR 20 YRS QUIT AGE 84  Vaping Use   Vaping Use: Never used  Substance and Sexual Activity   Alcohol use: No   Drug use: No   Sexual activity: Not Currently    Birth control/protection: None  Other Topics Concern   Not on file  Social History Narrative   Lives in one level home with his wife - their children live in [place] and [place] - both within 45 minutes   Social Determinants of Health   Financial Resource Strain: Low Risk    Difficulty of Paying Living Expenses: Not hard at all  Food Insecurity: No Food Insecurity   Worried About Charity fundraiser in the Last Year: Never true   [place] in the Last Year: Never true  Transportation Needs: No Transportation Needs   Lack of Transportation (Medical): No   Lack of Transportation (Non-Medical): No  Physical Activity: Sufficiently Active   Days of Exercise per Week: 5 days   Minutes of Exercise per Session: 30 min  Stress: No Stress Concern Present   Feeling of Stress : Not at all  Social Connections: Moderately Isolated   Frequency of Communication with Friends and Family: More than three times a week   Frequency of Social Gatherings with Friends and Family: Three times a week   Attends Religious Services: Never   Active Member of Clubs or Organizations: Not on file    Attends Archivist Meetings: Never   Marital Status: Married    Tobacco Counseling Counseling given: Not Answered Tobacco comments: SMOKED AND CHEW TOBACCO FOR 20 YRS QUIT AGE 72   Clinical Intake:  Pre-visit preparation completed: Yes  Pain : 0-10 Pain Score: 3  Pain Type: Chronic pain Pain Location: Knee Pain Orientation: Right Pain Descriptors / Indicators: Aching, Discomfort, Sore Pain Onset: More than a month ago Pain Frequency: Intermittent     BMI - recorded: 33.47 Nutritional Status: BMI > 30  Obese Nutritional Risks: None Diabetes: No  How often do you need to have someone help you when you read instructions, pamphlets, or other written materials from your doctor or pharmacy?: 1 - Never  Diabetic? No  Interpreter Needed?: No  Information entered by :: Ericia Moxley, LPN   Activities of Daily Living In your present state of health, do you have any difficulty performing the following activities: 10/29/2020  Hearing? Y  Comment is checking into getting hearing aids  Vision? N  Difficulty concentrating or making decisions? N  Comment just slower to think and remember now  Walking or climbing stairs? Y  Dressing or bathing? N  Doing errands, shopping? N  Preparing Food and eating ? N  Using the Toilet? N  In the past six months, have you accidently leaked urine? Y  Comment wears depends  Do you have problems with loss of bowel control? N  Managing your Medications? N  Managing your Finances? N  Housekeeping or managing your Housekeeping? N  Some recent data might be hidden    Patient Care Team: Claretta Fraise, MD as PCP - General (Family Medicine) Kathie Rhodes, MD (Inactive) as Consulting Physician (Urology) Sandford Craze, MD as Consulting Physician (Dermatology) Hayden Pedro, MD as Consulting Physician (Ophthalmology) Netta Cedars, MD as Consulting Physician (Orthopedic Surgery) Rosalin Hawking, MD as Consulting Physician  (Neurology)  Indicate any recent Medical Services you may have received from other than [facility] providers in the past year (date may be approximate).  Assessment:   This is a routine wellness examination for [place].  Hearing/Vision screen Hearing Screening - Comments:: C/o moderate hearing difficulties - is looking into hearing aids Vision Screening - Comments:: Wears rx glasses - behind on annual eye exams with Dr Rosana Hoes in [place]  Dietary issues and exercise activities discussed: Current Exercise Habits: Home exercise routine, Type of exercise: walking;stretching, Time (Minutes): 30, Frequency (Times/Week): 5, Weekly Exercise (Minutes/Week): 150, Intensity: Moderate, Exercise limited by: neurologic condition(s);orthopedic condition(s)   Goals Addressed             This Visit's Progress    Prevent falls   On track    Use your cane at all times when ambulating       Depression Screen PHQ 2/9 Scores 10/29/2020 09/24/2020 09/10/2020 09/10/2020 06/12/2020 05/08/2020 11/08/2019  PHQ - 2 Score 1 1 3  0 0 0 0  PHQ- 9 Score 3 2 4  - - - -    Fall Risk Fall Risk  10/29/2020 09/24/2020 09/10/2020 06/12/2020 05/08/2020  Falls in the past year? 0 0 0 0 0  Comment - - - - -  Number falls in past yr: 0 - - - -  Injury with Fall? 0 - - - -  Risk for fall due to : Impaired balance/gait;Orthopedic patient;Medication side effect;Other (Comment) - - - -  Risk for fall due to: Comment hx of TIA - - - -  Follow up Education provided;Falls prevention discussed Falls evaluation completed - - -  Comment - - - - -    FALL RISK PREVENTION PERTAINING TO THE HOME:  Any stairs in or around the home? No  If so, are there any without handrails? No  Home free of loose throw rugs in walkways, pet beds, electrical cords, etc? Yes  Adequate lighting in your home to reduce risk of falls? Yes   ASSISTIVE DEVICES UTILIZED TO PREVENT FALLS:  Life alert? No  Use of a cane, walker or w/c? Yes  Grab bars in the  bathroom? Yes  Shower chair or bench in shower? Yes  Elevated toilet seat or a handicapped toilet? Yes   TIMED UP AND GO:  Was the test performed? No . Telephonic visit  Cognitive Function: MMSE - Mini Mental State Exam 01/30/2018 01/20/2017 01/15/2016 01/16/2015  Orientation to time 5 5 5 5   Orientation to Place 5 5 5 5   Registration 3 3 3 3   Attention/ Calculation 5 5 3 3   Recall 3 2 3 3   Language- name 2 objects 2 2 2 2   Language- repeat 0 1 1 1   Language- follow 3 step command 3 3 3 3   Language- read & follow direction 1 1 1 1   Write a sentence 1 1 1 1   Copy design 1 1 1  0  Total score 29 29 28 27      6CIT Screen 02/08/2019  What Year? 0 points  What month? 0 points  What time? 0 points  Count back from 20 0 points  Months in reverse 0 points  Repeat phrase 2 points  Total Score 2    Immunizations Immunization History  Administered Date(s) Administered   Fluad Quad(high Dose 65+) 11/07/2018   Hepatitis B 07/22/1993, 08/26/1993, 04/28/1994   Influenza, High Dose Seasonal PF 11/10/2015, 11/25/2016, 11/22/2017   Influenza,inj,Quad PF,6+ Mos 11/07/2013   Influenza-Unspecified 11/25/2012, 12/09/2014   Pneumococcal Conjugate-13 01/14/2014   Pneumococcal Polysaccharide-23 07/04/2012   Zoster, Live 01/05/2013    TDAP status: Due, Education has been provided regarding  the importance of this vaccine. Advised may receive this vaccine at local pharmacy or Health Dept. Aware to provide a copy of the vaccination record if obtained from local pharmacy or Health Dept. Verbalized acceptance and understanding.  Flu Vaccine status: Declined, Education has been provided regarding the importance of this vaccine but patient still declined. Advised may receive this vaccine at local pharmacy or Health Dept. Aware to provide a copy of the vaccination record if obtained from local pharmacy or Health Dept. Verbalized acceptance and understanding.  Pneumococcal vaccine status: Up to  date  Covid-19 vaccine status: Declined, Education has been provided regarding the importance of this vaccine but patient still declined. Advised may receive this vaccine at local pharmacy or Health Dept.or vaccine clinic. Aware to provide a copy of the vaccination record if obtained from local pharmacy or Health Dept. Verbalized acceptance and understanding.  Qualifies for Shingles Vaccine? Yes   Zostavax completed Yes   Shingrix Completed?: No.    Education has been provided regarding the importance of this vaccine. Patient has been advised to call insurance company to determine out of pocket expense if they have not yet received this vaccine. Advised may also receive vaccine at local pharmacy or Health Dept. Verbalized acceptance and understanding.  Screening Tests Health Maintenance  Topic Date Due   COVID-19 Vaccine (1) Never done   Zoster Vaccines- Shingrix (1 of 2) 12/11/2020 (Originally 06/28/1951)   INFLUENZA VACCINE  04/24/2021 (Originally 08/25/2020)   TETANUS/TDAP  06/12/2021 (Originally 06/28/1951)   HPV VACCINES  Aged Out   DEXA SCAN  Discontinued    Health Maintenance  Health Maintenance Due  Topic Date Due   COVID-19 Vaccine (1) Never done    Colorectal cancer screening: No longer required.   Lung Cancer Screening: (Low Dose CT Chest recommended if Age 72-80 years, 30 pack-year currently smoking OR have quit w/in 15years.) does not qualify.   Additional Screening:  Hepatitis C Screening: does not qualify  Vision Screening: Recommended annual ophthalmology exams for early detection of glaucoma and other disorders of the eye. Is the patient up to date with their annual eye exam?  No  Who is the provider or what is the name of the office in which the patient attends annual eye exams? [facility] in [place] If pt is not established with a provider, would they like to be referred to a provider to establish care? No .   Dental Screening: Recommended annual dental exams for proper  oral hygiene  Community Resource Referral / Chronic Care Management: CRR required this visit?  No   CCM required this visit?  No      Plan:     I have personally reviewed and noted the following in the patient's chart:   Medical and social history Use of alcohol, tobacco or illicit drugs  Current medications and supplements including opioid prescriptions. Patient is not currently taking opioid prescriptions. Functional ability and status Nutritional status Physical activity Advanced directives List of other physicians Hospitalizations, surgeries, and ER visits in previous 12 months Vitals Screenings to include cognitive, depression, and falls Referrals and appointments  In addition, I have reviewed and discussed with patient certain preventive protocols, quality metrics, and best practice recommendations. A written personalized care plan for preventive services as well as general preventive health recommendations were provided to patient.     Sandrea Hammond, LPN   27/0/3500   Nurse Notes: None

## 2020-10-29 NOTE — Patient Instructions (Signed)
[name] , Thank you for taking time to come for your Medicare Wellness Visit. I appreciate your ongoing commitment to your health goals. Please review the following plan we discussed and let me know if I can assist you in the future.   Screening recommendations/referrals: Colonoscopy: Done 08/11/2010 - no repeat required Recommended yearly ophthalmology/optometry visit for glaucoma screening and checkup Recommended yearly dental visit for hygiene and checkup  Vaccinations: Influenza vaccine: Done 11/07/2018 - declines annually Pneumococcal vaccine: Done 07/04/2012 & 01/14/2014 Tdap vaccine: Due. Every 10 years Shingles vaccine: Zostavax done 2014 - Shingrix discussed. Please contact your pharmacy for coverage information.     Covid-19: Declined  Advanced directives: Please bring a copy of your health care power of attorney and living will to the office to be added to your chart at your convenience.   Conditions/risks identified: Aim for 30 minutes of exercise or brisk walking each day, drink 6-8 glasses of water and eat lots of fruits and vegetables.   Next appointment: Follow up in one year for your annual wellness visit.   Preventive Care 50 Years and Older, Male  Preventive care refers to lifestyle choices and visits with your health care provider that can promote health and wellness. What does preventive care include? A yearly physical exam. This is also called an annual well check. Dental exams once or twice a year. Routine eye exams. Ask your health care provider how often you should have your eyes checked. Personal lifestyle choices, including: Daily care of your teeth and gums. Regular physical activity. Eating a healthy diet. Avoiding tobacco and drug use. Limiting alcohol use. Practicing safe sex. Taking low doses of aspirin every day. Taking vitamin and mineral supplements as recommended by your health care provider. What happens during an annual well check? The  services and screenings done by your health care provider during your annual well check will depend on your age, overall health, lifestyle risk factors, and family history of disease. Counseling  Your health care provider may ask you questions about your: Alcohol use. Tobacco use. Drug use. Emotional well-being. Home and relationship well-being. Sexual activity. Eating habits. History of falls. Memory and ability to understand (cognition). Work and work Statistician. Screening  You may have the following tests or measurements: Height, weight, and BMI. Blood pressure. Lipid and cholesterol levels. These may be checked every 5 years, or more frequently if you are over 57 years old. Skin check. Lung cancer screening. You may have this screening every year starting at age 61 if you have a 30-pack-year history of smoking and currently smoke or have quit within the past 15 years. Fecal occult blood test (FOBT) of the stool. You may have this test every year starting at age 55. Flexible sigmoidoscopy or colonoscopy. You may have a sigmoidoscopy every 5 years or a colonoscopy every 10 years starting at age 82. Prostate cancer screening. Recommendations will vary depending on your family history and other risks. Hepatitis C blood test. Hepatitis B blood test. Sexually transmitted disease (STD) testing. Diabetes screening. This is done by checking your blood sugar (glucose) after you have not eaten for a while (fasting). You may have this done every 1-3 years. Abdominal aortic aneurysm (AAA) screening. You may need this if you are a current or former smoker. Osteoporosis. You may be screened starting at age 36 if you are at high risk. Talk with your health care provider about your test results, treatment options, and if necessary, the need for more tests. Vaccines  Your health care provider may recommend certain vaccines, such as: Influenza vaccine. This is recommended every year. Tetanus,  diphtheria, and acellular pertussis (Tdap, Td) vaccine. You may need a Td booster every 10 years. Zoster vaccine. You may need this after age 97. Pneumococcal 13-valent conjugate (PCV13) vaccine. One dose is recommended after age 22. Pneumococcal polysaccharide (PPSV23) vaccine. One dose is recommended after age 23. Talk to your health care provider about which screenings and vaccines you need and how often you need them. This information is not intended to replace advice given to you by your health care provider. Make sure you discuss any questions you have with your health care provider. Document Released: 02/07/2015 Document Revised: 10/01/2015 Document Reviewed: 11/12/2014 Elsevier Interactive Patient Education  2017 [employer] Prevention in the Home Falls can cause injuries. They can happen to people of all ages. There are many things you can do to make your home safe and to help prevent falls. What can I do on the outside of my home? Regularly fix the edges of walkways and driveways and fix any cracks. Remove anything that might make you trip as you walk through a door, such as a raised step or threshold. Trim any bushes or trees on the path to your home. Use bright outdoor lighting. Clear any walking paths of anything that might make someone trip, such as rocks or tools. Regularly check to see if handrails are loose or broken. Make sure that both sides of any steps have handrails. Any raised decks and porches should have guardrails on the edges. Have any leaves, snow, or ice cleared regularly. Use sand or salt on walking paths during winter. Clean up any spills in your garage right away. This includes oil or grease spills. What can I do in the bathroom? Use night lights. Install grab bars by the toilet and in the tub and shower. Do not use towel bars as grab bars. Use non-skid mats or decals in the tub or shower. If you need to sit down in the shower, use a plastic,  non-slip stool. Keep the floor dry. Clean up any water that spills on the floor as soon as it happens. Remove soap buildup in the tub or shower regularly. Attach bath mats securely with double-sided non-slip rug tape. Do not have throw rugs and other things on the floor that can make you trip. What can I do in the bedroom? Use night lights. Make sure that you have a light by your bed that is easy to reach. Do not use any sheets or blankets that are too big for your bed. They should not hang down onto the floor. Have a firm chair that has side arms. You can use this for support while you get dressed. Do not have throw rugs and other things on the floor that can make you trip. What can I do in the kitchen? Clean up any spills right away. Avoid walking on wet floors. Keep items that you use a lot in easy-to-reach places. If you need to reach something above you, use a strong step stool that has a grab bar. Keep electrical cords out of the way. Do not use floor polish or wax that makes floors slippery. If you must use wax, use non-skid floor wax. Do not have throw rugs and other things on the floor that can make you trip. What can I do with my stairs? Do not leave any items on the stairs. Make sure that there are  handrails on both sides of the stairs and use them. Fix handrails that are broken or loose. Make sure that handrails are as long as the stairways. Check any carpeting to make sure that it is firmly attached to the stairs. Fix any carpet that is loose or worn. Avoid having throw rugs at the top or bottom of the stairs. If you do have throw rugs, attach them to the floor with carpet tape. Make sure that you have a light switch at the top of the stairs and the bottom of the stairs. If you do not have them, ask someone to add them for you. What else can I do to help prevent falls? Wear shoes that: Do not have high heels. Have rubber bottoms. Are comfortable and fit you well. Are closed  at the toe. Do not wear sandals. If you use a stepladder: Make sure that it is fully opened. Do not climb a closed stepladder. Make sure that both sides of the stepladder are locked into place. Ask someone to hold it for you, if possible. Clearly mark and make sure that you can see: Any grab bars or handrails. First and last steps. Where the edge of each step is. Use tools that help you move around (mobility aids) if they are needed. These include: Canes. Walkers. Scooters. Crutches. Turn on the lights when you go into a dark area. Replace any light bulbs as soon as they burn out. Set up your furniture so you have a clear path. Avoid moving your furniture around. If any of your floors are uneven, fix them. If there are any pets around you, be aware of where they are. Review your medicines with your doctor. Some medicines can make you feel dizzy. This can increase your chance of falling. Ask your doctor what other things that you can do to help prevent falls. This information is not intended to replace advice given to you by your health care provider. Make sure you discuss any questions you have with your health care provider. Document Released: 11/07/2008 Document Revised: 06/19/2015 Document Reviewed: 02/15/2014 Elsevier Interactive Patient Education  2017 [employer].

## 2020-11-05 ENCOUNTER — Other Ambulatory Visit: Payer: Self-pay

## 2020-11-05 ENCOUNTER — Encounter: Payer: Self-pay | Admitting: Family Medicine

## 2020-11-05 ENCOUNTER — Ambulatory Visit (INDEPENDENT_AMBULATORY_CARE_PROVIDER_SITE_OTHER): Payer: Medicare Other | Admitting: Family Medicine

## 2020-11-05 VITALS — BP 159/66 | HR 76 | Temp 97.9°F | Ht 71.0 in | Wt 243.4 lb

## 2020-11-05 DIAGNOSIS — Z8546 Personal history of malignant neoplasm of prostate: Secondary | ICD-10-CM

## 2020-11-05 DIAGNOSIS — I6930 Unspecified sequelae of cerebral infarction: Secondary | ICD-10-CM | POA: Diagnosis not present

## 2020-11-05 MED ORDER — LISINOPRIL 40 MG PO TABS: 40.0000 mg | ORAL_TABLET | Freq: Every day | ORAL | 1 refills | Status: DC

## 2020-11-05 MED ORDER — PREDNISONE 10 MG PO TABS: ORAL_TABLET | ORAL | 0 refills | Status: DC

## 2020-11-05 NOTE — Progress Notes (Signed)
Subjective:  Patient ID: [name], male    DOB: 1932/06/14  Age: 87 y.o. MRN: [id number]  CC: Follow-up   HPI [name] presents for  presents for  follow-up of hypertension. Patient has no history of headache chest pain or shortness of breath or recent cough. Patient also denies symptoms of TIA such as focal numbness or weakness. Patient denies side effects from medication. States taking it regularly. Home BP readings are at systolic 263.   PT. Has been hoarse for a week. Still having some posterior drainage.   Needs blood work for his prostate done every four months. Would like for it to be drawn here.    Depression screen [facility] 2/9 11/05/2020 10/29/2020 09/24/2020  Decreased Interest 0 1 1  Down, Depressed, Hopeless 0 0 0  PHQ - 2 Score 0 1 1  Altered sleeping - 1 0  Tired, decreased energy - 1 1  Change in appetite - 0 0  Feeling bad or failure about yourself  - 0 0  Trouble concentrating - 0 0  Moving slowly or fidgety/restless - 0 0  Suicidal thoughts - 0 0  PHQ-9 Score - 3 2  Difficult doing work/chores - Somewhat difficult Somewhat difficult  Some recent data might be hidden    History [name] has a past medical history of Aphasia, Arthritis, BNC (bladder neck contracture), BPPV (benign paroxysmal positional vertigo), Cataract, Diverticulosis, H/O diarrhea (SECONDARY TO RADIATION THERAPY --  INTERMITANT  DIARRHEA), Hematuria, History of hemorrhagic cystitis, History of prostate cancer (CURRENTLY  ELEVATED PSA--  LUPRON INJECTIONS), Hyperlipidemia, Hypertension, Hypothyroidism, Radiation cystitis, Radiation proctitis, Urinary incontinence, Urinary retention, and Vitamin D deficiency.   He has a past surgical history that includes Prostatectomy (1997); Colonoscopy w/ polypectomy (2005; 08/11/2010); CYSTO/ FULGERATION OF BLEEDERS/ RESECTION BLADDER NECK CONTRACTURE (07-06-2004); Cataract extraction w/ intraocular lens implant (Right); Lumbar disc surgery (1996);  Transurethral resection of bladder neck (N/A, 06/23/2012); Transurethral resection of bladder tumor with gyrus (turbt-gyrus) (N/A, 06/01/2013); Eye surgery; Spine surgery; and Knee arthroscopy (Right).   His family history includes Alzheimer's disease in his brother; Appendicitis (age of onset: 44) in his sister; CVA (age of onset: 2) in his mother; Cancer in his sister and sister; Congestive Heart Failure in his mother; Early death in his sister; Hip fracture in his brother; Kidney disease in his brother and sister; Obesity in his sister; Pancreatitis in his father; Pneumonia in his father; Prostate cancer in his brother.He reports that he quit smoking about 57 years ago. His smoking use included cigarettes. He quit smokeless tobacco use about 57 years ago.  His smokeless tobacco use included chew. He reports that he does not drink alcohol and does not use drugs.    ROS Review of Systems  Constitutional:  Negative for fever.  HENT:  Positive for postnasal drip and voice change (hoarse for a week).   Respiratory:  Negative for shortness of breath.   Cardiovascular:  Negative for chest pain.  Musculoskeletal:  Negative for arthralgias.  Skin:  Negative for rash.   Objective:  BP (!) 159/66   Pulse 76   Temp 97.9 F (36.6 C)   Ht 5\' 11"  (1.803 m)   Wt 243 lb 6.4 oz (110.4 kg)   SpO2 96%   BMI 33.95 kg/m   BP Readings from Last 3 Encounters:  11/05/20 (!) 159/66  09/24/20 (!) 156/69  09/10/20 (!) 178/71    Wt Readings from Last 3 Encounters:  11/05/20 243 lb 6.4 oz (110.4  kg)  10/29/20 240 lb (108.9 kg)  09/24/20 240 lb (108.9 kg)     Physical Exam Constitutional:      General: He is not in acute distress.    Appearance: He is well-developed.  HENT:     Head: Normocephalic and atraumatic.     Right Ear: External ear normal.     Left Ear: External ear normal.     Nose: Nose normal.  Eyes:     Conjunctiva/sclera: Conjunctivae normal.     Pupils: Pupils are equal, round, and  reactive to light.  Cardiovascular:     Rate and Rhythm: Normal rate and regular rhythm.     Heart sounds: Normal heart sounds. No murmur heard. Pulmonary:     Effort: Pulmonary effort is normal. No respiratory distress.     Breath sounds: Normal breath sounds. No wheezing or rales.  Abdominal:     Palpations: Abdomen is soft.     Tenderness: There is no abdominal tenderness.  Musculoskeletal:        General: Normal range of motion.     Cervical back: Normal range of motion and neck supple.  Skin:    General: Skin is warm and dry.  Neurological:     Mental Status: He is alert and oriented to person, place, and time.     Deep Tendon Reflexes: Reflexes are normal and symmetric.  Psychiatric:        Behavior: Behavior normal.        Thought Content: Thought content normal.        Judgment: Judgment normal.      Assessment & Plan:   [name] was seen today for follow-up.  Diagnoses and all orders for this visit:  H/O prostate cancer -     PSA, total and free; Standing -     Testosterone,Free and Total; Standing  Other orders -     lisinopril (ZESTRIL) 40 MG tablet; Take 1 tablet (40 mg total) by mouth daily. -     predniSONE (DELTASONE) 10 MG tablet; Take 5 daily for 2 days followed by 4,3,2 and 1 for 2 days each.      I have discontinued Theotis Burrow. Lampe's predniSONE. I have also changed his lisinopril. Additionally, I am having him start on predniSONE. Lastly, I am having him maintain his (FLAXSEED, LINSEED, PO), Leuprolide Acetate (6 Month), Vitamin D3, Magnesium, Probiotic Product (PROBIOTIC DAILY PO), aspirin, V-2 High Compression Hose, GARLIC PO, Multiple Vitamins-Minerals (CENTRUM SILVER ULTRA MENS PO), Euthyrox, amLODipine, atorvastatin, nabumetone, and triamcinolone cream.  Allergies as of 11/05/2020       Reactions   Caffeine Palpitations   Codeine Palpitations        Medication List        Accurate as of November 05, 2020  2:55 PM. If you have any  questions, ask your nurse or doctor.          amLODipine 10 MG tablet Commonly known as: NORVASC Take 1 tablet (10 mg total) by mouth daily.   aspirin 325 MG tablet Take 1 tablet (325 mg total) by mouth daily.   atorvastatin 40 MG tablet Commonly known as: LIPITOR Take 1 tablet (40 mg total) by mouth daily.   CENTRUM SILVER ULTRA MENS PO Take 1 tablet by mouth daily.   Euthyrox 88 MCG tablet Generic drug: levothyroxine TAKE 1 TABLET BY MOUTH ONCE DAILY BEFORE BREAKFAST   FLAXSEED (LINSEED) PO Take 30 mLs by mouth every morning. GRINDS FLAX SEEDS AND PUTS IN 1/4  CUP OF WATER   GARLIC PO Take 2 capsules by mouth daily.   Leuprolide Acetate (6 Month) 45 MG injection Commonly known as: LUPRON Inject 45 mg into the muscle every 6 (six) months.   lisinopril 40 MG tablet Commonly known as: ZESTRIL Take 1 tablet (40 mg total) by mouth daily. What changed:  medication strength how much to take Changed by: Claretta Fraise, MD   Magnesium 300 MG Caps Take 1 capsule by mouth daily.   nabumetone 500 MG tablet Commonly known as: Relafen Take 1 tablet (500 mg total) by mouth 2 (two) times daily.   predniSONE 10 MG tablet Commonly known as: DELTASONE Take 5 daily for 2 days followed by 4,3,2 and 1 for 2 days each. What changed: additional instructions Changed by: Claretta Fraise, MD   PROBIOTIC DAILY PO Take 1 capsule by mouth daily.   triamcinolone cream 0.1 % Commonly known as: KENALOG Apply topically 2 (two) times daily.   V-2 High Compression Hose Misc Wear daily when up walking around   Vitamin D3 125 MCG (5000 UT) Caps Take 1 capsule by mouth daily.         Follow-up: Return in about 6 months (around 05/06/2021) for hypertension, Hypothyroidism.  Claretta Fraise, M.D.

## 2020-11-24 ENCOUNTER — Telehealth: Payer: Self-pay | Admitting: Family Medicine

## 2020-11-24 ENCOUNTER — Other Ambulatory Visit: Payer: Self-pay | Admitting: Family Medicine

## 2020-11-24 DIAGNOSIS — R490 Dysphonia: Secondary | ICD-10-CM

## 2020-11-24 NOTE — Telephone Encounter (Signed)
HE will need to have a scope to look at his vocal cords byENT. Referral placed.

## 2020-11-24 NOTE — Telephone Encounter (Signed)
Wife aware

## 2020-11-27 ENCOUNTER — Telehealth: Payer: Self-pay | Admitting: Family Medicine

## 2020-11-27 NOTE — Telephone Encounter (Signed)
REFERRAL REQUEST Telephone Note  Have you been seen at our office for this problem? yes (Advise that they may need an appointment with their PCP before a referral can be done)  Reason for Referral: throat scope? Referral discussed with patient: yes  Best contact number of patient for referral team: #[phone]    Has patient been seen by a specialist for this issue before: no Patient provider preference for referral: none Patient location preference for referral: [address] in [place]   Patient notified that referrals can take up to a week or longer to process. If they haven't heard anything within a week they should call back and speak with the referral department.    Stacks' pt.  Please call wife about referral bc he has been waiting!

## 2020-12-02 ENCOUNTER — Other Ambulatory Visit (HOSPITAL_BASED_OUTPATIENT_CLINIC_OR_DEPARTMENT_OTHER): Payer: Self-pay | Admitting: Otolaryngology

## 2020-12-02 ENCOUNTER — Other Ambulatory Visit: Payer: Self-pay | Admitting: Otolaryngology

## 2020-12-02 DIAGNOSIS — J3801 Paralysis of vocal cords and larynx, unilateral: Secondary | ICD-10-CM | POA: Diagnosis not present

## 2020-12-02 DIAGNOSIS — R490 Dysphonia: Secondary | ICD-10-CM | POA: Diagnosis not present

## 2020-12-02 DIAGNOSIS — J3800 Paralysis of vocal cords and larynx, unspecified: Secondary | ICD-10-CM

## 2020-12-07 ENCOUNTER — Other Ambulatory Visit: Payer: Self-pay | Admitting: Family Medicine

## 2020-12-11 ENCOUNTER — Ambulatory Visit (HOSPITAL_BASED_OUTPATIENT_CLINIC_OR_DEPARTMENT_OTHER)
Admission: RE | Admit: 2020-12-11 | Discharge: 2020-12-11 | Disposition: A | Discharge status: Home / Self Care | Payer: Medicare Other | Source: Ambulatory Visit | Attending: Otolaryngology | Admitting: Otolaryngology

## 2020-12-11 ENCOUNTER — Other Ambulatory Visit: Payer: Self-pay

## 2020-12-11 DIAGNOSIS — J3800 Paralysis of vocal cords and larynx, unspecified: Secondary | ICD-10-CM | POA: Insufficient documentation

## 2020-12-11 LAB — POCT I-STAT CREATININE: Creatinine, Ser: 0.9 mg/dL (ref 0.61–1.24)

## 2020-12-11 MED ORDER — IOHEXOL 350 MG/ML SOLN
60.0000 mL | Freq: Once | INTRAVENOUS | Status: AC | PRN
  Administered 2020-12-11: 10:00:00 60 mL via INTRAVENOUS

## 2020-12-24 ENCOUNTER — Encounter: Payer: Self-pay | Admitting: Student

## 2020-12-24 ENCOUNTER — Ambulatory Visit (INDEPENDENT_AMBULATORY_CARE_PROVIDER_SITE_OTHER): Payer: Medicare Other | Admitting: Student

## 2020-12-24 ENCOUNTER — Other Ambulatory Visit: Payer: Self-pay

## 2020-12-24 VITALS — BP 132/88 | HR 69 | Resp 18 | Ht 71.0 in | Wt 250.0 lb

## 2020-12-24 DIAGNOSIS — J9859 Other diseases of mediastinum, not elsewhere classified: Secondary | ICD-10-CM | POA: Diagnosis not present

## 2020-12-24 DIAGNOSIS — R911 Solitary pulmonary nodule: Secondary | ICD-10-CM

## 2020-12-24 NOTE — Patient Instructions (Signed)
You will be called to schedule your CT Chest and I will be in touch as soon as it's done to discuss next steps

## 2020-12-24 NOTE — Progress Notes (Signed)
Synopsis: Referred for pulmonary nodule by Claretta Fraise, MD  Subjective:   PATIENT ID: [name] GENDER: male DOB: [birth year], MRN: [id number]  Chief Complaint  Patient presents with   pulmonary nodule   88yM former smoking and smokeless tobacco user, prostate cancer referred for 1.9 cm LUL nodule found on ct neck during workup of hoarseness  She has a little DOE sometimes. He has a cough sometimes, occasionally productive. Hoarseness over last month. No weight loss, hemoptysis.   [name] [id number]  Otherwise pertinent review of systems is negative.  No family history of lung cancer.   Smoked for only several years 42 years ago, did chew some tobacco. Had prostate cancer in his 64s. 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   Past Medical History:  Diagnosis Date   Aphasia    Arthritis    BNC (bladder neck contracture)    BPPV (benign paroxysmal positional vertigo)    Cataract    bilateral   Diverticulosis    H/O diarrhea SECONDARY TO RADIATION THERAPY --  INTERMITANT  DIARRHEA   Hematuria    History of hemorrhagic cystitis    SECONDARY RADIATION THERAPY 1997   History of prostate cancer CURRENTLY  ELEVATED PSA--  LUPRON INJECTIONS   S/P PROSTATECTOMY AND RADIATION THERAPY  1997   Hyperlipidemia    Hypertension    Hypothyroidism    Radiation cystitis    Radiation proctitis    Urinary incontinence    Urinary retention    Vitamin D deficiency      Family History  Problem Relation Age of Onset   Prostate cancer Brother    Kidney disease Brother    Congestive Heart Failure Mother    CVA Mother 34   Pancreatitis Father    Pneumonia Father    Cancer Sister        breast   Obesity Sister    Early death Sister        appendicitis   Appendicitis Sister 65       rupture   Cancer Sister        breast   Kidney disease Sister    Alzheimer's disease Brother    Hip fracture Brother      Past Surgical History:  Procedure  Laterality Date   CATARACT EXTRACTION W/ INTRAOCULAR LENS IMPLANT Right    COLONOSCOPY W/ POLYPECTOMY  2005; 08/11/2010   2005: 1 cm hyperplastic sigmoid polyp, Dr. Wynetta Emery 2012: hyperplastic splenic flexure polyp -1 cm, diverticulosis, radiation proctitis, anal stenosis   CYSTO/ FULGERATION OF BLEEDERS/ RESECTION BLADDER NECK CONTRACTURE  07-06-2004   [place] AND RADIATION CYSTITIS   EYE SURGERY     KNEE ARTHROSCOPY Right    LUMBAR DISC SURGERY  1996   L4 -- L5   PROSTATECTOMY  1997   SPINE SURGERY     TRANSURETHRAL RESECTION OF BLADDER NECK N/A 06/23/2012   Procedure: TRANSURETHRAL RESECTION OF BLADDER NECK CONTRACTURE WITH GYRUS;  Surgeon: Claybon Jabs, MD;  Location: [place];  Service: Urology;  Laterality: N/A;   TRANSURETHRAL RESECTION OF BLADDER TUMOR WITH GYRUS (TURBT-GYRUS) N/A 06/01/2013   Procedure: TRANSURETHRAL RESECTION OF BLADDER NECK CONTRACTURE WITH GYRUS (TURBT-GYRUS) AND INJECTION OF KENALOG;  Surgeon: Claybon Jabs, MD;  Location: [place];  Service: Urology;  Laterality: N/A;    Social History   Socioeconomic History   Marital status: Married    Spouse name: [name]   Number of children: 2   Years of  education: 12+   Highest education level: Some college, no degree  Occupational History   Occupation: Retired    Comment: Administrator, arts for [employer]  Tobacco Use   Smoking status: Former    Years: 20.00    Types: Cigarettes    Quit date: 1965    Years since quitting: 57.9   Smokeless tobacco: Former    Types: Chew    Quit date: 1965   Tobacco comments:    [place] 20 YRS QUIT AGE 60  Vaping Use   Vaping Use: Never used  Substance and Sexual Activity   Alcohol use: No   Drug use: No   Sexual activity: Not Currently    Birth control/protection: None  Other Topics Concern   Not on file  Social History Narrative   Lives in one level home with his wife - their children live in [place] and  [place] - both within 45 minutes   Social Determinants of Health   Financial Resource Strain: Low Risk    Difficulty of Paying Living Expenses: Not hard at all  Food Insecurity: No Food Insecurity   Worried About Charity fundraiser in the Last Year: Never true   [place] in the Last Year: Never true  Transportation Needs: No Transportation Needs   Lack of Transportation (Medical): No   Lack of Transportation (Non-Medical): No  Physical Activity: Sufficiently Active   Days of Exercise per Week: 5 days   Minutes of Exercise per Session: 30 min  Stress: No Stress Concern Present   Feeling of Stress : Not at all  Social Connections: Moderately Isolated   Frequency of Communication with Friends and Family: More than three times a week   Frequency of Social Gatherings with Friends and Family: Three times a week   Attends Religious Services: Never   Active Member of Clubs or Organizations: Not on file   Attends Archivist Meetings: Never   Marital Status: Married  Human resources officer Violence: Not At Risk   Fear of Current or Ex-Partner: No   Emotionally Abused: No   Physically Abused: No   Sexually Abused: No     Allergies  Allergen Reactions   Caffeine Palpitations   Codeine Palpitations     Outpatient Medications Prior to Visit  Medication Sig Dispense Refill   amLODipine (NORVASC) 10 MG tablet Take 1 tablet (10 mg total) by mouth daily. 90 tablet 3   aspirin 325 MG tablet Take 1 tablet (325 mg total) by mouth daily.     atorvastatin (LIPITOR) 40 MG tablet Take 1 tablet (40 mg total) by mouth daily. 90 tablet 1   Cholecalciferol (VITAMIN D3) 5000 UNITS CAPS Take 1 capsule by mouth daily.     Elastic Bandages & Supports (V-2 HIGH COMPRESSION HOSE) MISC Wear daily when up walking around 1 each 0   FLAXSEED, LINSEED, PO Take 30 mLs by mouth every morning. GRINDS FLAX SEEDS AND PUTS IN 1/4 CUP OF WATER     GARLIC PO Take 2 capsules by mouth daily.     Leuprolide  Acetate, 6 Month, (LUPRON) 45 MG injection Inject 45 mg into the muscle every 6 (six) months.     levothyroxine (SYNTHROID) 88 MCG tablet TAKE 1 TABLET BY MOUTH ONCE DAILY BEFORE BREAKFAST 90 tablet 2   lisinopril (ZESTRIL) 40 MG tablet Take 1 tablet (40 mg total) by mouth daily. 90 tablet 1   Magnesium 300 MG CAPS Take 1 capsule  by mouth daily.     Multiple Vitamins-Minerals (CENTRUM SILVER ULTRA MENS PO) Take 1 tablet by mouth daily.     nabumetone (RELAFEN) 500 MG tablet Take 1 tablet (500 mg total) by mouth 2 (two) times daily. 60 tablet 2   predniSONE (DELTASONE) 10 MG tablet Take 5 daily for 2 days followed by 4,3,2 and 1 for 2 days each. 30 tablet 0   Probiotic Product (PROBIOTIC DAILY PO) Take 1 capsule by mouth daily.     triamcinolone cream (KENALOG) 0.1 % Apply topically 2 (two) times daily.     No facility-administered medications prior to visit.       Objective:   Physical Exam:  General appearance: 87 y.o., male, NAD, conversant  Eyes: anicteric sclerae; PERRL, tracking appropriately HENT: NCAT; MMM, hoarse Neck: Trachea midline; no lymphadenopathy, no JVD Lungs: CTAB, no crackles, no wheeze, with normal respiratory effort CV: RRR, no murmur  Abdomen: Soft, non-tender; non-distended, BS present  Extremities: No peripheral edema, warm Skin: Normal turgor and texture; no rash Psych: Appropriate affect Neuro: Alert and oriented to person and place, no focal deficit     Vitals:   12/24/20 1530  BP: 132/88  Pulse: 69  Resp: 18  SpO2: 95%  Weight: 250 lb (113.4 kg)  Height: 5\' 11"  (1.803 m)   95% on RA BMI Readings from Last 3 Encounters:  12/24/20 34.87 kg/m  11/05/20 33.95 kg/m  10/29/20 33.47 kg/m   Wt Readings from Last 3 Encounters:  12/24/20 250 lb (113.4 kg)  11/05/20 243 lb 6.4 oz (110.4 kg)  10/29/20 240 lb (108.9 kg)     CBC    Component Value Date/Time   WBC 11.2 (H) 09/24/2020 0944   WBC 6.9 04/03/2016 1249   RBC 5.05 09/24/2020 0944    RBC 4.85 04/03/2016 1249   HGB 15.3 09/24/2020 0944   HCT 47.0 09/24/2020 0944   PLT 238 09/24/2020 0944   MCV 93 09/24/2020 0944   MCH 30.3 09/24/2020 0944   MCH 33.0 04/03/2016 1249   MCHC 32.6 09/24/2020 0944   MCHC 35.0 04/03/2016 1249   RDW 13.0 09/24/2020 0944   LYMPHSABS 3.3 (H) 09/24/2020 0944   MONOABS 0.7 04/03/2016 1249   EOSABS 0.2 09/24/2020 0944   BASOSABS 0.0 09/24/2020 0944      Chest Imaging:   CT neck 11/17 reviewed by me with medialization of left vocal cord, 1.9cm LUL nodule, upper portion of mediastinal mass at level 6 station, borderline mediastinal adenopathy at 4R  Pulmonary Functions Testing Results: No flowsheet data found.  Echocardiogram:   TTE 03/2016: - Left ventricle: The cavity size was normal. Systolic function was    mildly reduced. The estimated ejection fraction was in the range    of 45% to 50%. Diffuse hypokinesis. Doppler parameters are    consistent with abnormal left ventricular relaxation (grade 1    diastolic dysfunction).  - Ventricular septum: Septal motion showed abnormal function and    dyssynergy.  - Aortic valve: Trileaflet; mildly thickened, mildly calcified    leaflets. There was mild regurgitation.      Assessment & Plan:   # Left upper lobe nodule # Mediastinal mass at level 6 station - likely responsible for vocal cord paralysis  Plan: - CT Chest super D   I spent 36 minutes dedicated to the care of this patient on the date of this encounter to include pre-visit review of records, face-to-face time with the patient discussing conditions above, post visit ordering  of testing, clinical documentation with the electronic health record, making appropriate referrals as documented, and communicating necessary findings to members of the patients care team.    Maryjane Hurter, MD [facility] 12/24/2020 5:14 PM

## 2020-12-29 ENCOUNTER — Telehealth: Payer: Self-pay | Admitting: Student

## 2020-12-29 NOTE — Telephone Encounter (Signed)
Will route to NM

## 2021-01-01 ENCOUNTER — Ambulatory Visit (HOSPITAL_BASED_OUTPATIENT_CLINIC_OR_DEPARTMENT_OTHER): Payer: Medicare Other

## 2021-01-02 ENCOUNTER — Ambulatory Visit (HOSPITAL_BASED_OUTPATIENT_CLINIC_OR_DEPARTMENT_OTHER)
Admission: RE | Admit: 2021-01-02 | Discharge: 2021-01-02 | Disposition: A | Discharge status: Home / Self Care | Payer: Medicare Other | Source: Ambulatory Visit | Attending: Student | Admitting: Student

## 2021-01-02 ENCOUNTER — Other Ambulatory Visit: Payer: Self-pay

## 2021-01-02 DIAGNOSIS — R911 Solitary pulmonary nodule: Secondary | ICD-10-CM | POA: Diagnosis not present

## 2021-01-02 DIAGNOSIS — I700 Atherosclerosis of aorta: Secondary | ICD-10-CM | POA: Diagnosis not present

## 2021-01-05 NOTE — Telephone Encounter (Signed)
Called Alvester Chou to see if I could get the patient scheduled for an appt. He stated that I would need to call his mother, [name], in order to get the patient scheduled.   I called [name] but she did not answer. I could not leave a VM. Will attempt to call back later today.

## 2021-01-05 NOTE — Telephone Encounter (Signed)
Called and discussed CT Chest results. Options for diagnosis include robotic bronch (recommended - certainly lowest PTX risk and probably lowest procedural risk overall), TT-FNA - highest pneumothorax risk, and possibly empiric treatment. They are nervous about general anesthesia and reactions he's had in the past. Family would like to discuss in greater detail before proceeding with next steps. I offered to try to set up a clinic appointment for them this week.  [place]

## 2021-01-06 ENCOUNTER — Encounter: Payer: Self-pay | Admitting: Student

## 2021-01-06 ENCOUNTER — Other Ambulatory Visit: Payer: Self-pay

## 2021-01-06 ENCOUNTER — Ambulatory Visit (INDEPENDENT_AMBULATORY_CARE_PROVIDER_SITE_OTHER): Payer: Medicare Other | Admitting: Student

## 2021-01-06 VITALS — BP 138/78 | HR 83 | Temp 97.7°F | Ht 74.0 in | Wt 250.0 lb

## 2021-01-06 DIAGNOSIS — C3490 Malignant neoplasm of unspecified part of unspecified bronchus or lung: Secondary | ICD-10-CM | POA: Diagnosis not present

## 2021-01-06 DIAGNOSIS — R911 Solitary pulmonary nodule: Secondary | ICD-10-CM

## 2021-01-06 NOTE — Telephone Encounter (Signed)
Pt scheduled for OV today with Dr. Verlee Monte. Nothing further needed at this time.

## 2021-01-06 NOTE — Patient Instructions (Addendum)
-   Norton Blizzard will call to set up clinic appointment  - You will be called to schedule PET scan - There is appointment 02/19/20 which you can cancel if everything taken care of with oncology

## 2021-01-06 NOTE — Progress Notes (Signed)
Synopsis: Referred for pulmonary nodule by Claretta Fraise, MD  Subjective:   PATIENT ID: [name] GENDER: male DOB: 05-06-1932, MRN: [id number]  Chief Complaint  Patient presents with   Follow-up    Pt states here for CT results    87yM former smoking and smokeless tobacco user, prostate cancer referred for 1.9 cm LUL nodule found on ct neck during workup of hoarseness  She has a little DOE sometimes. He has a cough sometimes, occasionally productive. Hoarseness over last month. No weight loss, hemoptysis.   No family history of lung cancer.   Smoked for only several years 58 years ago, did chew some tobacco. Had prostate cancer in his 70s. 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   Otherwise pertinent review of systems is negative.  Interval HPI: No new cough, dyspnea, CP. Still hoarse. Concern regarding prior bouts of delirium, underlying prior TIAs/microvascular CNS disease, being 'slow to wake up' make him and his family reluctant to expose him to general anesthesia.   Past Medical History:  Diagnosis Date   Aphasia    Arthritis    BNC (bladder neck contracture)    BPPV (benign paroxysmal positional vertigo)    Cataract    bilateral   Diverticulosis    H/O diarrhea SECONDARY TO RADIATION THERAPY --  INTERMITANT  DIARRHEA   Hematuria    History of hemorrhagic cystitis    SECONDARY RADIATION THERAPY 1997   History of prostate cancer CURRENTLY  ELEVATED PSA--  LUPRON INJECTIONS   S/P PROSTATECTOMY AND RADIATION THERAPY  1997   Hyperlipidemia    Hypertension    Hypothyroidism    Radiation cystitis    Radiation proctitis    Urinary incontinence    Urinary retention    Vitamin D deficiency      Family History  Problem Relation Age of Onset   Prostate cancer Brother    Kidney disease Brother    Congestive Heart Failure Mother    CVA Mother 34   Pancreatitis Father    Pneumonia Father    Cancer Sister        breast   Obesity Sister     Early death Sister        appendicitis   Appendicitis Sister 29       rupture   Cancer Sister        breast   Kidney disease Sister    Alzheimer's disease Brother    Hip fracture Brother      Past Surgical History:  Procedure Laterality Date   CATARACT EXTRACTION W/ INTRAOCULAR LENS IMPLANT Right    COLONOSCOPY W/ POLYPECTOMY  2005; 08/11/2010   2005: 1 cm hyperplastic sigmoid polyp, Dr. Wynetta Emery 2012: hyperplastic splenic flexure polyp -1 cm, diverticulosis, radiation proctitis, anal stenosis   CYSTO/ FULGERATION OF BLEEDERS/ RESECTION BLADDER NECK CONTRACTURE  07-06-2004   [place] AND RADIATION CYSTITIS   EYE SURGERY     KNEE ARTHROSCOPY Right    LUMBAR DISC SURGERY  1996   L4 -- L5   PROSTATECTOMY  1997   SPINE SURGERY     TRANSURETHRAL RESECTION OF BLADDER NECK N/A 06/23/2012   Procedure: TRANSURETHRAL RESECTION OF BLADDER NECK CONTRACTURE WITH GYRUS;  Surgeon: Claybon Jabs, MD;  Location: [place];  Service: Urology;  Laterality: N/A;   TRANSURETHRAL RESECTION OF BLADDER TUMOR WITH GYRUS (TURBT-GYRUS) N/A 06/01/2013   Procedure: TRANSURETHRAL RESECTION OF BLADDER NECK CONTRACTURE WITH GYRUS (TURBT-GYRUS) AND INJECTION OF KENALOG;  Surgeon: Thana Farr  Karsten Ro, MD;  Location[facility];  Service: Urology;  Laterality: N/A;    Social History   Socioeconomic History   Marital status: Married    Spouse name: [name]   Number of children: 2   Years of education: 12+   Highest education level: Some college, no degree  Occupational History   Occupation: Retired    Comment: Administrator, arts for [employer]  Tobacco Use   Smoking status: Former    Years: 20.00    Types: Cigarettes    Quit date: 1965    Years since quitting: 57.9   Smokeless tobacco: Former    Types: Chew    Quit date: 1965   Tobacco comments:    [place] 20 YRS QUIT AGE 13  Vaping Use   Vaping Use: Never used  Substance and Sexual Activity   Alcohol use: No    Drug use: No   Sexual activity: Not Currently    Birth control/protection: None  Other Topics Concern   Not on file  Social History Narrative   Lives in one level home with his wife - their children live in [place] and [place] - both within 45 minutes   Social Determinants of Health   Financial Resource Strain: Low Risk    Difficulty of Paying Living Expenses: Not hard at all  Food Insecurity: No Food Insecurity   Worried About Charity fundraiser in the Last Year: Never true   [place] in the Last Year: Never true  Transportation Needs: No Transportation Needs   Lack of Transportation (Medical): No   Lack of Transportation (Non-Medical): No  Physical Activity: Sufficiently Active   Days of Exercise per Week: 5 days   Minutes of Exercise per Session: 30 min  Stress: No Stress Concern Present   Feeling of Stress : Not at all  Social Connections: Moderately Isolated   Frequency of Communication with Friends and Family: More than three times a week   Frequency of Social Gatherings with Friends and Family: Three times a week   Attends Religious Services: Never   Active Member of Clubs or Organizations: Not on file   Attends Archivist Meetings: Never   Marital Status: Married  Human resources officer Violence: Not At Risk   Fear of Current or Ex-Partner: No   Emotionally Abused: No   Physically Abused: No   Sexually Abused: No     Allergies  Allergen Reactions   Caffeine Palpitations   Codeine Palpitations     Outpatient Medications Prior to Visit  Medication Sig Dispense Refill   amLODipine (NORVASC) 10 MG tablet Take 1 tablet (10 mg total) by mouth daily. 90 tablet 3   aspirin 325 MG tablet Take 1 tablet (325 mg total) by mouth daily.     atorvastatin (LIPITOR) 40 MG tablet Take 1 tablet (40 mg total) by mouth daily. 90 tablet 1   Cholecalciferol (VITAMIN D3) 5000 UNITS CAPS Take 1 capsule by mouth daily.     Elastic Bandages & Supports (V-2 HIGH  COMPRESSION HOSE) MISC Wear daily when up walking around 1 each 0   FLAXSEED, LINSEED, PO Take 30 mLs by mouth every morning. GRINDS FLAX SEEDS AND PUTS IN 1/4 CUP OF WATER     GARLIC PO Take 2 capsules by mouth daily.     Leuprolide Acetate, 6 Month, (LUPRON) 45 MG injection Inject 45 mg into the muscle every 6 (six) months.     levothyroxine (  SYNTHROID) 88 MCG tablet TAKE 1 TABLET BY MOUTH ONCE DAILY BEFORE BREAKFAST 90 tablet 2   lisinopril (ZESTRIL) 40 MG tablet Take 1 tablet (40 mg total) by mouth daily. 90 tablet 1   Magnesium 300 MG CAPS Take 1 capsule by mouth daily.     Multiple Vitamins-Minerals (CENTRUM SILVER ULTRA MENS PO) Take 1 tablet by mouth daily.     nabumetone (RELAFEN) 500 MG tablet Take 1 tablet (500 mg total) by mouth 2 (two) times daily. 60 tablet 2   Probiotic Product (PROBIOTIC DAILY PO) Take 1 capsule by mouth daily.     triamcinolone cream (KENALOG) 0.1 % Apply topically 2 (two) times daily.     predniSONE (DELTASONE) 10 MG tablet Take 5 daily for 2 days followed by 4,3,2 and 1 for 2 days each. (Patient not taking: Reported on 01/06/2021) 30 tablet 0   No facility-administered medications prior to visit.       Objective:   Physical Exam:  General appearance: 87 y.o., male, NAD, conversant  Eyes: anicteric sclerae; PERRL, tracking appropriately HENT: NCAT; MMM, hoarse Neck: Trachea midline; no lymphadenopathy, no JVD Lungs: CTAB, no crackles, no wheeze, with normal respiratory effort CV: RRR, no murmur  Abdomen: Soft, non-tender; non-distended, BS present  Extremities: No peripheral edema, warm Skin: Normal turgor and texture; no rash Psych: Appropriate affect Neuro: Alert and oriented to person and place, no focal deficit     Vitals:   01/06/21 1423  BP: 138/78  Pulse: 83  Temp: 97.7 F (36.5 C)  TempSrc: Oral  SpO2: 98%  Weight: 250 lb (113.4 kg)  Height: 6\' 2"  (1.88 m)   98% on RA BMI Readings from Last 3 Encounters:  01/06/21 32.10  kg/m  12/24/20 34.87 kg/m  11/05/20 33.95 kg/m   Wt Readings from Last 3 Encounters:  01/06/21 250 lb (113.4 kg)  12/24/20 250 lb (113.4 kg)  11/05/20 243 lb 6.4 oz (110.4 kg)     CBC    Component Value Date/Time   WBC 11.2 (H) 09/24/2020 0944   WBC 6.9 04/03/2016 1249   RBC 5.05 09/24/2020 0944   RBC 4.85 04/03/2016 1249   HGB 15.3 09/24/2020 0944   HCT 47.0 09/24/2020 0944   PLT 238 09/24/2020 0944   MCV 93 09/24/2020 0944   MCH 30.3 09/24/2020 0944   MCH 33.0 04/03/2016 1249   MCHC 32.6 09/24/2020 0944   MCHC 35.0 04/03/2016 1249   RDW 13.0 09/24/2020 0944   LYMPHSABS 3.3 (H) 09/24/2020 0944   MONOABS 0.7 04/03/2016 1249   EOSABS 0.2 09/24/2020 0944   BASOSABS 0.0 09/24/2020 0944      Chest Imaging:   CT neck 11/17 reviewed by me with medialization of left vocal cord, 1.9cm LUL nodule, upper portion of mediastinal mass at level 6 station, borderline mediastinal adenopathy at 4R  CT Chest reviewed by me 12/9 with 2.8cm LUL nodule which might be contiguous with AP window adenopathy/mass, subcarinal adenopathy  Pulmonary Functions Testing Results: No flowsheet data found.  Echocardiogram:   TTE 03/2016: - Left ventricle: The cavity size was normal. Systolic function was    mildly reduced. The estimated ejection fraction was in the range    of 45% to 50%. Diffuse hypokinesis. Doppler parameters are    consistent with abnormal left ventricular relaxation (grade 1    diastolic dysfunction).  - Ventricular septum: Septal motion showed abnormal function and    dyssynergy.  - Aortic valve: Trileaflet; mildly thickened, mildly calcified  leaflets. There was mild regurgitation.      Assessment & Plan:   # Left upper lobe nodule # Mediastinal mass at level 6 station - likely responsible for vocal cord paralysis  I am unsure if the nodule and mediastinal mass at level 6 station are contiguous. Discussed risks and benefits of several approaches to  diagnosis/mgmt including robotic bronchoscopy/EBUS under general with probably lowest overall procedural risks despite exposure to general anesthesia (<5% risk postop respiratory failure, 1-3% pneumothorax, 01/998 major bleeding), TT-FNA with highest pneumothorax risk (~20%), consideration of empiric treatment if deemed candidate for it by oncology. The patient and his family are leaning against any invasive procedures at time of our discussion and are interested in referral to oncology/radiation oncology to see if he'd be candidate for some form of empiric treatment.  Plan: - PET/CT - referral made to oncology/radiation oncology for consideration of empiric treatment of likely NSCLC   I spent 45 minutes dedicated to the care of this patient on the date of this encounter to include pre-visit review of records, face-to-face time with the patient discussing conditions above, post visit ordering of testing, clinical documentation with the electronic health record, making appropriate referrals as documented, and communicating necessary findings to members of the patients care team.    Maryjane Hurter, MD [facility] 01/06/2021 4:15 PM

## 2021-01-07 ENCOUNTER — Telehealth: Payer: Self-pay | Admitting: Internal Medicine

## 2021-01-07 ENCOUNTER — Encounter: Payer: Self-pay | Admitting: *Deleted

## 2021-01-07 DIAGNOSIS — R918 Other nonspecific abnormal finding of lung field: Secondary | ICD-10-CM

## 2021-01-07 NOTE — Progress Notes (Signed)
Oncology Nurse Navigator Documentation  Oncology Nurse Navigator Flowsheets 01/07/2021  Abnormal Finding Date 01/05/2021  Navigator Location [facility]  Referral Date to RadOnc/MedOnc 01/07/2021  Navigator Encounter Type Other:  Patient Visit Type Other/I received referral on [name] today. I updated new patient coordinator to call and schedule with Dr. Julien Nordmann on 12/20.   Barriers/Navigation Needs Coordination of Care  Interventions Coordination of Care  Acuity Level 2-Minimal Needs (1-2 Barriers Identified)  Coordination of Care Other  Time Spent with Patient 45

## 2021-01-07 NOTE — Telephone Encounter (Signed)
Scheduled appt per 12/13 referral. Pt's wife is aware of appt date and time.

## 2021-01-11 ENCOUNTER — Emergency Department (HOSPITAL_COMMUNITY): Payer: Medicare Other

## 2021-01-11 ENCOUNTER — Emergency Department (HOSPITAL_COMMUNITY)
Admission: EM | Admit: 2021-01-11 | Discharge: 2021-01-11 | Disposition: A | Discharge status: Home / Self Care | Payer: Medicare Other | Attending: Emergency Medicine | Admitting: Emergency Medicine

## 2021-01-11 ENCOUNTER — Encounter (HOSPITAL_COMMUNITY): Payer: Self-pay | Admitting: Oncology

## 2021-01-11 ENCOUNTER — Other Ambulatory Visit: Payer: Self-pay

## 2021-01-11 DIAGNOSIS — I700 Atherosclerosis of aorta: Secondary | ICD-10-CM | POA: Diagnosis not present

## 2021-01-11 DIAGNOSIS — I10 Essential (primary) hypertension: Secondary | ICD-10-CM | POA: Insufficient documentation

## 2021-01-11 DIAGNOSIS — E039 Hypothyroidism, unspecified: Secondary | ICD-10-CM | POA: Insufficient documentation

## 2021-01-11 DIAGNOSIS — M2578 Osteophyte, vertebrae: Secondary | ICD-10-CM | POA: Diagnosis not present

## 2021-01-11 DIAGNOSIS — M545 Low back pain, unspecified: Secondary | ICD-10-CM | POA: Diagnosis not present

## 2021-01-11 DIAGNOSIS — Z7982 Long term (current) use of aspirin: Secondary | ICD-10-CM | POA: Diagnosis not present

## 2021-01-11 DIAGNOSIS — K5792 Diverticulitis of intestine, part unspecified, without perforation or abscess without bleeding: Secondary | ICD-10-CM | POA: Insufficient documentation

## 2021-01-11 DIAGNOSIS — M48061 Spinal stenosis, lumbar region without neurogenic claudication: Secondary | ICD-10-CM

## 2021-01-11 DIAGNOSIS — Z87891 Personal history of nicotine dependence: Secondary | ICD-10-CM | POA: Diagnosis not present

## 2021-01-11 DIAGNOSIS — Z79899 Other long term (current) drug therapy: Secondary | ICD-10-CM | POA: Insufficient documentation

## 2021-01-11 DIAGNOSIS — K5730 Diverticulosis of large intestine without perforation or abscess without bleeding: Secondary | ICD-10-CM | POA: Diagnosis not present

## 2021-01-11 DIAGNOSIS — K449 Diaphragmatic hernia without obstruction or gangrene: Secondary | ICD-10-CM | POA: Diagnosis not present

## 2021-01-11 DIAGNOSIS — N281 Cyst of kidney, acquired: Secondary | ICD-10-CM | POA: Diagnosis not present

## 2021-01-11 DIAGNOSIS — M549 Dorsalgia, unspecified: Secondary | ICD-10-CM | POA: Diagnosis present

## 2021-01-11 LAB — CBC WITH DIFFERENTIAL/PLATELET
Abs Immature Granulocytes: 0.05 10*3/uL (ref 0.00–0.07)
Basophils Absolute: 0 10*3/uL (ref 0.0–0.1)
Basophils Relative: 0 %
Eosinophils Absolute: 0.2 10*3/uL (ref 0.0–0.5)
Eosinophils Relative: 2 %
HCT: 44.6 % (ref 39.0–52.0)
Hemoglobin: 14.8 g/dL (ref 13.0–17.0)
Immature Granulocytes: 1 %
Lymphocytes Relative: 16 %
Lymphs Abs: 1.5 10*3/uL (ref 0.7–4.0)
MCH: 31.8 pg (ref 26.0–34.0)
MCHC: 33.2 g/dL (ref 30.0–36.0)
MCV: 95.9 fL (ref 80.0–100.0)
Monocytes Absolute: 0.9 10*3/uL (ref 0.1–1.0)
Monocytes Relative: 9 %
Neutro Abs: 6.8 10*3/uL (ref 1.7–7.7)
Neutrophils Relative %: 72 %
Platelets: 227 10*3/uL (ref 150–400)
RBC: 4.65 MIL/uL (ref 4.22–5.81)
RDW: 13 % (ref 11.5–15.5)
WBC: 9.4 10*3/uL (ref 4.0–10.5)
nRBC: 0 % (ref 0.0–0.2)

## 2021-01-11 LAB — BASIC METABOLIC PANEL
Anion gap: 7 (ref 5–15)
BUN: 18 mg/dL (ref 8–23)
CO2: 26 mmol/L (ref 22–32)
Calcium: 9.8 mg/dL (ref 8.9–10.3)
Chloride: 104 mmol/L (ref 98–111)
Creatinine, Ser: 0.9 mg/dL (ref 0.61–1.24)
GFR, Estimated: >60 mL/min (ref >60)
Glucose, Bld: 121 mg/dL — ABNORMAL HIGH (ref 70–99)
Potassium: 5 mmol/L (ref 3.5–5.1)
Sodium: 137 mmol/L (ref 135–145)

## 2021-01-11 MED ORDER — MORPHINE SULFATE (PF) 4 MG/ML IV SOLN
4.0000 mg | Freq: Once | INTRAVENOUS | Status: AC
  Administered 2021-01-11: 19:00:00 4 mg via INTRAVENOUS
  Filled 2021-01-11: qty 1

## 2021-01-11 MED ORDER — METHOCARBAMOL 500 MG PO TABS: 500.0000 mg | ORAL_TABLET | Freq: Two times a day (BID) | ORAL | 0 refills | Status: DC | PRN

## 2021-01-11 NOTE — ED Provider Notes (Signed)
Emergency Medicine Provider Triage Evaluation Note  CROY DRUMWRIGHT , a 87 y.o. male  was evaluated in triage.  Pt complains of back pain that started yesterday. Chest pain, sob. Hx same, no injury  Review of Systems  Positive: Back pain Negative: Chest pain, sob  Physical Exam  BP (!) 173/93 (BP Location: Left Arm)    Pulse 87    Temp 98.8 F (37.1 C) (Oral)    Resp 16    SpO2 96%  Gen:   Awake, no distress   Resp:  Normal effort  MSK:   Moves extremities without difficulty   Medical Decision Making  Medically screening exam initiated at 4:07 PM.  Appropriate orders placed.  NICHOLSON STARACE was informed that the remainder of the evaluation will be completed by another provider, this initial triage assessment does not replace that evaluation, and the importance of remaining in the ED until their evaluation is complete.     Bishop Dublin 01/11/21 1607    Carmin Muskrat, MD 01/11/21 2157

## 2021-01-11 NOTE — ED Provider Notes (Signed)
[place] [facility] DEPT Provider Note   CSN: [id number] Arrival date & time: 01/11/21  1513     History Chief Complaint  Patient presents with   Back Pain    [name] is a 87 y.o. male hx of HTN, HL, here presenting with back pain.  Patient has intermittent back pain for the last several years.  Patient states that he was coughing since yesterday and had worsening back pain after he coughed.  He states that the pain is gradual in onset and not sharp.  Patient did not take any meds prior to arrival.  Patient states that he is incontinent at baseline but denies any blood in his urine or any trouble urinating.  Denies any nausea or vomiting.   The history is provided by the patient.      Past Medical History:  Diagnosis Date   Aphasia    Arthritis    BNC (bladder neck contracture)    BPPV (benign paroxysmal positional vertigo)    Cataract    bilateral   Diverticulosis    H/O diarrhea SECONDARY TO RADIATION THERAPY --  INTERMITANT  DIARRHEA   Hematuria    History of hemorrhagic cystitis    SECONDARY RADIATION THERAPY 1997   History of prostate cancer CURRENTLY  ELEVATED PSA--  LUPRON INJECTIONS   S/P PROSTATECTOMY AND RADIATION THERAPY  1997   Hyperlipidemia    Hypertension    Hypothyroidism    Radiation cystitis    Radiation proctitis    Urinary incontinence    Urinary retention    Vitamin D deficiency     Patient Active Problem List   Diagnosis Date Noted   Aortic atherosclerosis ([place]) 09/10/2020   Dizziness 09/10/2020   Osteoarthritis of right knee 12/29/2017   Pain in right knee 03/01/2017   Multiple lacunar infarcts ([place]) 09/30/2016   TIA (transient ischemic attack) 93/26/7124   Metabolic syndrome 58/09/9831   Osteopenia of neck of left femur 01/15/2016   H/O prostate cancer 06/27/2015   Obesity (BMI 30.0-34.9) 12/27/2014   Essential hypertension 07/04/2012   Hyperlipidemia 07/04/2012   Hypothyroidism 07/04/2012   Bladder  neck contracture 06/22/2012   Radiation proctitis 08/11/2010   Anal stenosis 08/11/2010   Personal history of colonic polyps 08/11/2010    Past Surgical History:  Procedure Laterality Date   CATARACT EXTRACTION W/ INTRAOCULAR LENS IMPLANT Right    COLONOSCOPY W/ POLYPECTOMY  2005; 08/11/2010   2005: 1 cm hyperplastic sigmoid polyp, Dr. Wynetta Emery 2012: hyperplastic splenic flexure polyp -1 cm, diverticulosis, radiation proctitis, anal stenosis   CYSTO/ FULGERATION OF BLEEDERS/ RESECTION BLADDER NECK CONTRACTURE  07-06-2004   BNC AND RADIATION CYSTITIS   EYE SURGERY     KNEE ARTHROSCOPY Right    LUMBAR DISC SURGERY  1996   L4 -- L5   PROSTATECTOMY  1997   SPINE SURGERY     TRANSURETHRAL RESECTION OF BLADDER NECK N/A 06/23/2012   Procedure: TRANSURETHRAL RESECTION OF BLADDER NECK CONTRACTURE WITH GYRUS;  Surgeon: Claybon Jabs, MD;  Location: [facility];  Service: Urology;  Laterality: N/A;   TRANSURETHRAL RESECTION OF BLADDER TUMOR WITH GYRUS (TURBT-GYRUS) N/A 06/01/2013   Procedure: TRANSURETHRAL RESECTION OF BLADDER NECK CONTRACTURE WITH GYRUS (TURBT-GYRUS) AND INJECTION OF KENALOG;  Surgeon: Claybon Jabs, MD;  Location: [facility];  Service: Urology;  Laterality: N/A;       Family History  Problem Relation Age of Onset   Prostate cancer Brother    Kidney disease Brother  Congestive Heart Failure Mother    CVA Mother 69   Pancreatitis Father    Pneumonia Father    Cancer Sister        breast   Obesity Sister    Early death Sister        appendicitis   Appendicitis Sister 78       rupture   Cancer Sister        breast   Kidney disease Sister    Alzheimer's disease Brother    Hip fracture Brother     Social History   Tobacco Use   Smoking status: Former    Years: 20.00    Types: Cigarettes    Quit date: 1965    Years since quitting: 58.0   Smokeless tobacco: Former    Types: Chew    Quit date: 1965   Tobacco comments:    SMOKED  AND CHEW TOBACCO FOR 20 YRS QUIT AGE 45  Vaping Use   Vaping Use: Never used  Substance Use Topics   Alcohol use: No   Drug use: No    Home Medications Prior to Admission medications   Medication Sig Start Date End Date Taking? Authorizing Provider  amLODipine (NORVASC) 10 MG tablet Take 1 tablet (10 mg total) by mouth daily. 09/10/20   Claretta Fraise, MD  aspirin 325 MG tablet Take 1 tablet (325 mg total) by mouth daily. 04/05/16   Rama, Venetia Maxon, MD  atorvastatin (LIPITOR) 40 MG tablet Take 1 tablet (40 mg total) by mouth daily. 09/10/20   Claretta Fraise, MD  Cholecalciferol (VITAMIN D3) 5000 UNITS CAPS Take 1 capsule by mouth daily.    [provider]  Elastic Bandages & Supports (V-2 HIGH COMPRESSION HOSE) MISC Wear daily when up walking around 09/13/16   Hassell Done, Mary-Margaret, FNP  FLAXSEED, LINSEED, PO Take 30 mLs by mouth every morning. GRINDS FLAX SEEDS AND PUTS IN 1/4 CUP OF WATER    [provider]  GARLIC PO Take 2 capsules by mouth daily.    [provider]  Leuprolide Acetate, 6 Month, (LUPRON) 45 MG injection Inject 45 mg into the muscle every 6 (six) months.    [provider]  levothyroxine (SYNTHROID) 88 MCG tablet TAKE 1 TABLET BY MOUTH ONCE DAILY BEFORE BREAKFAST 12/08/20   Claretta Fraise, MD  lisinopril (ZESTRIL) 40 MG tablet Take 1 tablet (40 mg total) by mouth daily. 11/05/20   Claretta Fraise, MD  Magnesium 300 MG CAPS Take 1 capsule by mouth daily.    [provider]  Multiple Vitamins-Minerals (CENTRUM SILVER ULTRA MENS PO) Take 1 tablet by mouth daily.    [provider]  nabumetone (RELAFEN) 500 MG tablet Take 1 tablet (500 mg total) by mouth 2 (two) times daily. 09/10/20   Claretta Fraise, MD  predniSONE (DELTASONE) 10 MG tablet Take 5 daily for 2 days followed by 4,3,2 and 1 for 2 days each. Patient not taking: Reported on 01/06/2021 11/05/20   Claretta Fraise, MD  Probiotic Product (PROBIOTIC DAILY PO) Take 1  capsule by mouth daily.    [provider]  triamcinolone cream (KENALOG) 0.1 % Apply topically 2 (two) times daily. 10/14/20   [provider]    Allergies    Caffeine and Codeine  Review of Systems   Review of Systems  Musculoskeletal:  Positive for back pain.  All other systems reviewed and are negative.  Physical Exam Updated Vital Signs BP (!) 151/67 (BP Location: Right Arm)  Pulse 70    Temp 98.8 F (37.1 C) (Oral)    Resp 16    SpO2 96%   Physical Exam Vitals and nursing note reviewed.  Constitutional:      Appearance: Normal appearance.  HENT:     Head: Normocephalic.     Nose: Nose normal.     Mouth/Throat:     Mouth: Mucous membranes are moist.  Eyes:     Extraocular Movements: Extraocular movements intact.     Pupils: Pupils are equal, round, and reactive to light.  Cardiovascular:     Rate and Rhythm: Normal rate and regular rhythm.     Pulses: Normal pulses.     Heart sounds: Normal heart sounds.  Pulmonary:     Effort: Pulmonary effort is normal.     Breath sounds: Normal breath sounds.  Abdominal:     General: Abdomen is flat.     Palpations: Abdomen is soft.     Comments: Left lower lumbar versus paralumbar tenderness  Musculoskeletal:     Cervical back: Normal range of motion and neck supple.     Comments: Left paralumbar tenderness.  No saddle anesthesia.  Normal reflexes bilateral legs.  Skin:    General: Skin is warm.     Capillary Refill: Capillary refill takes less than 2 seconds.  Neurological:     General: No focal deficit present.     Mental Status: He is alert and oriented to person, place, and time.  Psychiatric:        Mood and Affect: Mood normal.        Behavior: Behavior normal.    ED Results / Procedures / Treatments   Labs (all labs ordered are listed, but only abnormal results are displayed) Labs Reviewed  BASIC METABOLIC PANEL - Abnormal; Notable for the following components:      Result Value   Glucose,  Bld 121 (*)    All other components within normal limits  CBC WITH DIFFERENTIAL/PLATELET  URINALYSIS, ROUTINE W REFLEX MICROSCOPIC    EKG None  Radiology DG Lumbar Spine Complete  Result Date: 01/11/2021 CLINICAL DATA:  Low-back pain for 3 days.  No injury. EXAM: LUMBAR SPINE - COMPLETE 4+ VIEW COMPARISON:  10/05/2016. FINDINGS: No fracture, bone lesion or spondylolisthesis. Apparent bony fusion across the L4-L5 disc interspace, interspace narrowed. Mild loss of disc height at L2-L3 and L3-L4. There are endplate osteophytes from the lower thoracic spine through L4. Skeletal structures are demineralized. Atherosclerotic calcifications noted along the abdominal aorta. Multiple surgical vascular clips in the pelvis are consistent with a previous prostatectomy. IMPRESSION: 1. No fracture or acute finding. 2. Degenerative changes as detailed, without significant change compared to the prior lumbar spine radiographs. Electronically Signed   By: Lajean Manes M.D.   On: 01/11/2021 16:38    Procedures Procedures   Medications Ordered in ED Medications  morphine 4 MG/ML injection 4 mg (4 mg Intravenous Given 01/11/21 1833)    ED Course  I have reviewed the triage vital signs and the nursing notes.  Pertinent labs & imaging results that were available during my care of the patient were reviewed by me and considered in my medical decision making (see chart for details).    MDM Rules/Calculators/A&P                         [name] is a 87 y.o. male here presenting with back pain.  Patient has left lower back pain.  Patient has a history of previous laminectomy.  Patient is hypertensive and slightly uncomfortable.  I have low suspicion for dissection. I think likely renal colic versus musculoskeletal pain.  He has no saddle anesthesia or signs of cord compression.  We will get CT renal stone and CT lumbar spine.  We will get CBC and BMP  9:24 PM Patient's labs are unremarkable.  X-rays  did not show any fracture.  CT showed chronic stenosis with no obvious spinal cord compression. Pain improved with pain meds. Will dc home with robaxin as needed and have him follow up with PCP       Final Clinical Impression(s) / ED Diagnoses Final diagnoses:  None    Rx / DC Orders ED Discharge Orders     None        Drenda Freeze, MD 01/11/21 2128

## 2021-01-11 NOTE — ED Triage Notes (Signed)
Pt reports lower back pain that began yesterday.  Per spouse pt has a 3 year hx of intermittent back pain.  Pt states he believes he exasperated his back sneezing yesterday.

## 2021-01-11 NOTE — Discharge Instructions (Signed)
Your CT scan and labs were unremarkable today  Take Tylenol for pain and take Robaxin for severe pain  See your doctor for follow-up.  I do recommend physical therapy to help with your pain  Return to ER if you have worsening back pain, abdominal pain, vomiting, chest pain

## 2021-01-13 ENCOUNTER — Telehealth: Payer: Self-pay | Admitting: Medical Oncology

## 2021-01-13 ENCOUNTER — Inpatient Hospital Stay: Payer: Medicare Other

## 2021-01-13 ENCOUNTER — Telehealth: Payer: Self-pay | Admitting: Internal Medicine

## 2021-01-13 ENCOUNTER — Inpatient Hospital Stay: Payer: Medicare Other | Admitting: Internal Medicine

## 2021-01-13 NOTE — Telephone Encounter (Signed)
Wants 3 family members to be in exam room with pt . I told her only 1 family member and the others can be on conference call. She will call back and tell us who will conference in on the phone.

## 2021-01-13 NOTE — Telephone Encounter (Signed)
Pt's daughter had called in requesting to cancel and r/s pt's appt for today with Dr. Julien Nordmann. Ernie Hew, no answer. Left msg asking her to call me back to r/s appt.

## 2021-01-13 NOTE — Telephone Encounter (Signed)
Pt's daughter Mateo Flow called me back to r/s appt. She is aware of new appt date and time.

## 2021-01-15 ENCOUNTER — Telehealth: Payer: Self-pay | Admitting: Medical Oncology

## 2021-01-15 NOTE — Telephone Encounter (Signed)
New pt Visit 01/03-Dtr, Mateo Flow,  will accompany her father.   The pt's  wife and son , Alvester Chou , will be on the phone.

## 2021-01-15 NOTE — Telephone Encounter (Signed)
Spoke to dtr.

## 2021-01-16 ENCOUNTER — Ambulatory Visit (HOSPITAL_COMMUNITY): Admission: RE | Admit: 2021-01-16 | Payer: Medicare Other | Source: Ambulatory Visit

## 2021-01-20 DIAGNOSIS — B351 Tinea unguium: Secondary | ICD-10-CM | POA: Diagnosis not present

## 2021-01-20 DIAGNOSIS — I70203 Unspecified atherosclerosis of native arteries of extremities, bilateral legs: Secondary | ICD-10-CM | POA: Diagnosis not present

## 2021-01-20 DIAGNOSIS — M79671 Pain in right foot: Secondary | ICD-10-CM | POA: Diagnosis not present

## 2021-01-20 DIAGNOSIS — L84 Corns and callosities: Secondary | ICD-10-CM | POA: Diagnosis not present

## 2021-01-23 ENCOUNTER — Ambulatory Visit (HOSPITAL_COMMUNITY)
Admission: RE | Admit: 2021-01-23 | Discharge: 2021-01-23 | Disposition: A | Discharge status: Home / Self Care | Payer: Medicare Other | Source: Ambulatory Visit | Attending: Student | Admitting: Student

## 2021-01-23 ENCOUNTER — Other Ambulatory Visit: Payer: Self-pay

## 2021-01-23 DIAGNOSIS — G839 Paralytic syndrome, unspecified: Secondary | ICD-10-CM | POA: Diagnosis not present

## 2021-01-23 DIAGNOSIS — R911 Solitary pulmonary nodule: Secondary | ICD-10-CM | POA: Insufficient documentation

## 2021-01-23 DIAGNOSIS — K7689 Other specified diseases of liver: Secondary | ICD-10-CM | POA: Diagnosis not present

## 2021-01-23 DIAGNOSIS — C3490 Malignant neoplasm of unspecified part of unspecified bronchus or lung: Secondary | ICD-10-CM | POA: Diagnosis not present

## 2021-01-23 DIAGNOSIS — K5730 Diverticulosis of large intestine without perforation or abscess without bleeding: Secondary | ICD-10-CM | POA: Diagnosis not present

## 2021-01-23 LAB — GLUCOSE, CAPILLARY: Glucose-Capillary: 108 mg/dL — ABNORMAL HIGH (ref 70–99)

## 2021-01-23 MED ORDER — FLUDEOXYGLUCOSE F - 18 (FDG) INJECTION
13.6000 | Freq: Once | INTRAVENOUS | Status: AC
  Administered 2021-01-23: 15:00:00 13.6 via INTRAVENOUS

## 2021-01-27 ENCOUNTER — Inpatient Hospital Stay: Payer: Medicare Other | Attending: Internal Medicine | Admitting: Internal Medicine

## 2021-01-27 ENCOUNTER — Encounter: Payer: Self-pay | Admitting: Internal Medicine

## 2021-01-27 ENCOUNTER — Other Ambulatory Visit: Payer: Self-pay

## 2021-01-27 ENCOUNTER — Inpatient Hospital Stay: Payer: Medicare Other

## 2021-01-27 DIAGNOSIS — R918 Other nonspecific abnormal finding of lung field: Secondary | ICD-10-CM | POA: Insufficient documentation

## 2021-01-27 LAB — CBC WITH DIFFERENTIAL (CANCER CENTER ONLY)
Abs Immature Granulocytes: 0.03 10*3/uL (ref 0.00–0.07)
Basophils Absolute: 0.1 10*3/uL (ref 0.0–0.1)
Basophils Relative: 1 %
Eosinophils Absolute: 0.4 10*3/uL (ref 0.0–0.5)
Eosinophils Relative: 4 %
HCT: 42.4 % (ref 39.0–52.0)
Hemoglobin: 14.1 g/dL (ref 13.0–17.0)
Immature Granulocytes: 0 %
Lymphocytes Relative: 22 %
Lymphs Abs: 2.2 10*3/uL (ref 0.7–4.0)
MCH: 31.2 pg (ref 26.0–34.0)
MCHC: 33.3 g/dL (ref 30.0–36.0)
MCV: 93.8 fL (ref 80.0–100.0)
Monocytes Absolute: 0.9 10*3/uL (ref 0.1–1.0)
Monocytes Relative: 9 %
Neutro Abs: 6.3 10*3/uL (ref 1.7–7.7)
Neutrophils Relative %: 64 %
Platelet Count: 284 10*3/uL (ref 150–400)
RBC: 4.52 MIL/uL (ref 4.22–5.81)
RDW: 12.9 % (ref 11.5–15.5)
WBC Count: 9.9 10*3/uL (ref 4.0–10.5)
nRBC: 0 % (ref 0.0–0.2)

## 2021-01-27 LAB — CMP (CANCER CENTER ONLY)
ALT: 34 U/L (ref 0–44)
AST: 27 U/L (ref 15–41)
Albumin: 4 g/dL (ref 3.5–5.0)
Alkaline Phosphatase: 99 U/L (ref 38–126)
Anion gap: 6 (ref 5–15)
BUN: 17 mg/dL (ref 8–23)
CO2: 24 mmol/L (ref 22–32)
Calcium: 9.5 mg/dL (ref 8.9–10.3)
Chloride: 103 mmol/L (ref 98–111)
Creatinine: 0.86 mg/dL (ref 0.61–1.24)
GFR, Estimated: >60 mL/min (ref >60)
Glucose, Bld: 122 mg/dL — ABNORMAL HIGH (ref 70–99)
Potassium: 4.5 mmol/L (ref 3.5–5.1)
Sodium: 133 mmol/L — ABNORMAL LOW (ref 135–145)
Total Bilirubin: 0.4 mg/dL (ref 0.3–1.2)
Total Protein: 7.1 g/dL (ref 6.5–8.1)

## 2021-01-27 NOTE — Progress Notes (Signed)
[facility] [phone]   [phone]  CONSULT NOTE  REFERRING PHYSICIAN: Dr. Leslye Peer  REASON FOR CONSULTATION:  88 years old white male with likely lung cancer.  HPI [name] is a 88 y.o. male with past medical history significant for multiple medical problems including hypertension, dyslipidemia, hypothyroidism, history of prostate cancer more than 30 years ago s/p surgery and radiotherapy, diverticulosis, osteoarthritis, radiation-induced cystitis, urinary incontinence, TIA and remote history of smoking but quit 50 years ago.  The patient mentioned that in June 2019 he has some nasal congestion and was diagnosed with sinusitis treated with 2 courses of antibiotics with no improvement.  He started having some hoarseness of his voice and was seen by his primary care physician complaining of dizzy spells, hoarseness and cough.  He had CT scan of the neck on December 11, 2020 and that showed medialization of the left vocal cord with mild dilatation of the left pyriform sinus consistent with vocal cord paralysis.  There was also 1.9 cm left upper lobe pulmonary nodule concerning for malignancy.  The patient was referred to Dr. Verlee Monte and CT super D of the chest was performed on January 02, 2021 and that showed 2.3 x 2.8 cm lobulated nodule in the medial aspect of the anterior segment of the left upper lobe with significant border of contact with the adjacent mediastinum.  There was also AP window nodal mass measuring 2.5 x 3.4 cm.  There was some slight marginal irregularity of the liver raises suspicious for cirrhosis.  The patient had a discussion with Dr. Verlee Monte about consideration of bronchoscopy with endobronchial ultrasound but he was hesitant because of his history of anesthesia issues in the past.  The patient had a PET scan performed on January 23, 2021 and that showed hypermetabolic medial left upper lobe pulmonary nodule compatible with the  patient is known left upper lobe suspicious nodule.  There was also hypermetabolic AP window of and subcarinal lymphadenopathy concerning for metastatic involvement.  There was mottled FDG accumulation in the liver parenchyma with 1 particular small focus of FDG accumulation that is above the background liver parenchyma levels and this is indeterminate. The patient was referred to me today for evaluation and recommendation regarding his condition. When seen today he is feeling fine except for the back pain and hoarseness of his voice with mild cough but no significant chest pain, shortness of breath or hemoptysis.  He lost around 5 pounds in the last few weeks.  He has no nausea, vomiting, diarrhea or constipation.  He has no headache or visual changes. Family history significant for father with pancreatitis.  Mother had congestive heart failure and stroke.  Two sister had breast cancer and 2 brother had prostate cancer. Family history.  The patient is married and has 2 children.  He was accompanied today by his daughter [name].  His wife [name] and son [name] were available by phone during the visit.  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  He has a history for smoking for around 20 years but quit 50 years ago.  He has no history of alcohol or drug abuse.  HPI  Past Medical History:  Diagnosis Date   Aphasia    Arthritis    BNC (bladder neck contracture)    BPPV (benign paroxysmal positional vertigo)    Cataract    bilateral   Diverticulosis    H/O diarrhea SECONDARY TO RADIATION THERAPY --  INTERMITANT  DIARRHEA   Hematuria    History of hemorrhagic cystitis    SECONDARY RADIATION THERAPY 1997   History of prostate cancer CURRENTLY  ELEVATED PSA--  LUPRON INJECTIONS   S/P PROSTATECTOMY AND RADIATION THERAPY  1997   Hyperlipidemia    Hypertension    Hypothyroidism    Radiation cystitis    Radiation proctitis    Urinary incontinence    Urinary retention     Vitamin D deficiency     Past Surgical History:  Procedure Laterality Date   CATARACT EXTRACTION W/ INTRAOCULAR LENS IMPLANT Right    COLONOSCOPY W/ POLYPECTOMY  2005; 08/11/2010   2005: 1 cm hyperplastic sigmoid polyp, Dr. Wynetta Emery 2012: hyperplastic splenic flexure polyp -1 cm, diverticulosis, radiation proctitis, anal stenosis   CYSTO/ FULGERATION OF BLEEDERS/ RESECTION BLADDER NECK CONTRACTURE  07-06-2004   BNC AND RADIATION CYSTITIS   EYE SURGERY     KNEE ARTHROSCOPY Right    LUMBAR DISC SURGERY  1996   L4 -- L5   PROSTATECTOMY  1997   SPINE SURGERY     TRANSURETHRAL RESECTION OF BLADDER NECK N/A 06/23/2012   Procedure: TRANSURETHRAL RESECTION OF BLADDER NECK CONTRACTURE WITH GYRUS;  Surgeon: Claybon Jabs, MD;  Location: [facility];  Service: Urology;  Laterality: N/A;   TRANSURETHRAL RESECTION OF BLADDER TUMOR WITH GYRUS (TURBT-GYRUS) N/A 06/01/2013   Procedure: TRANSURETHRAL RESECTION OF BLADDER NECK CONTRACTURE WITH GYRUS (TURBT-GYRUS) AND INJECTION OF KENALOG;  Surgeon: Claybon Jabs, MD;  Location: [facility];  Service: Urology;  Laterality: N/A;    Family History  Problem Relation Age of Onset   Prostate cancer Brother    Kidney disease Brother    Congestive Heart Failure Mother    CVA Mother 78   Pancreatitis Father    Pneumonia Father    Cancer Sister        breast   Obesity Sister    Early death Sister        appendicitis   Appendicitis Sister 33       rupture   Cancer Sister        breast   Kidney disease Sister    Alzheimer's disease Brother    Hip fracture Brother     Social History Social History   Tobacco Use   Smoking status: Former    Years: 20.00    Types: Cigarettes    Quit date: 1965    Years since quitting: 58.0   Smokeless tobacco: Former    Types: Chew    Quit date: 1965   Tobacco comments:    SMOKED AND CHEW TOBACCO FOR 20 YRS QUIT AGE 37  Vaping Use   Vaping Use: Never used  Substance Use Topics    Alcohol use: No   Drug use: No    Allergies  Allergen Reactions   Caffeine Palpitations   Codeine Palpitations    Current Outpatient Medications  Medication Sig Dispense Refill   amLODipine (NORVASC) 10 MG tablet Take 1 tablet (10 mg total) by mouth daily. 90 tablet 3   aspirin 325 MG tablet Take 1 tablet (325 mg total) by mouth daily.     atorvastatin (LIPITOR) 40 MG tablet Take 1 tablet (40 mg total) by mouth daily. 90 tablet 1   Cholecalciferol (VITAMIN D3) 5000 UNITS CAPS Take 1 capsule by mouth daily.     Elastic Bandages & Supports (V-2 HIGH COMPRESSION HOSE) MISC Wear daily when up walking around 1 each 0  FLAXSEED, LINSEED, PO Take 30 mLs by mouth every morning. GRINDS FLAX SEEDS AND PUTS IN 1/4 CUP OF WATER     GARLIC PO Take 2 capsules by mouth daily.     Leuprolide Acetate, 6 Month, (LUPRON) 45 MG injection Inject 45 mg into the muscle every 6 (six) months.     levothyroxine (SYNTHROID) 88 MCG tablet TAKE 1 TABLET BY MOUTH ONCE DAILY BEFORE BREAKFAST 90 tablet 2   lisinopril (ZESTRIL) 40 MG tablet Take 1 tablet (40 mg total) by mouth daily. 90 tablet 1   Magnesium 300 MG CAPS Take 1 capsule by mouth daily.     methocarbamol (ROBAXIN) 500 MG tablet Take 1 tablet (500 mg total) by mouth 2 (two) times daily as needed for muscle spasms. 10 tablet 0   Multiple Vitamins-Minerals (CENTRUM SILVER ULTRA MENS PO) Take 1 tablet by mouth daily.     nabumetone (RELAFEN) 500 MG tablet Take 1 tablet (500 mg total) by mouth 2 (two) times daily. 60 tablet 2   predniSONE (DELTASONE) 10 MG tablet Take 5 daily for 2 days followed by 4,3,2 and 1 for 2 days each. (Patient not taking: Reported on 01/06/2021) 30 tablet 0   Probiotic Product (PROBIOTIC DAILY PO) Take 1 capsule by mouth daily.     triamcinolone cream (KENALOG) 0.1 % Apply topically 2 (two) times daily.     No current facility-administered medications for this visit.    Review of Systems  Constitutional: positive for fatigue  and weight loss Eyes: negative Ears, nose, mouth, throat, and face: positive for hoarseness Respiratory: positive for cough Cardiovascular: negative Gastrointestinal: negative Genitourinary:negative Integument/breast: negative Hematologic/lymphatic: negative Musculoskeletal:positive for back pain Neurological: negative Behavioral/Psych: negative Endocrine: negative Allergic/Immunologic: negative  Physical Exam  ONG:EXBMW, healthy, no distress, well nourished, and well developed SKIN: skin color, texture, turgor are normal, no rashes or significant lesions HEAD: Normocephalic, No masses, lesions, tenderness or abnormalities EYES: normal, PERRLA, Conjunctiva are pink and non-injected EARS: External ears normal, Canals clear OROPHARYNX:no exudate, no erythema, and lips, buccal mucosa, and tongue normal  NECK: supple, no adenopathy, no JVD LYMPH:  no palpable lymphadenopathy, no hepatosplenomegaly LUNGS: clear to auscultation , and palpation HEART: regular rate & rhythm, no murmurs, and no gallops ABDOMEN:abdomen soft, non-tender, normal bowel sounds, and no masses or organomegaly BACK: Back symmetric, no curvature., No CVA tenderness EXTREMITIES:no joint deformities, effusion, or inflammation, no edema  NEURO: alert & oriented x 3 with fluent speech, no focal motor/sensory deficits  PERFORMANCE STATUS: ECOG 1  LABORATORY DATA: Lab Results  Component Value Date   WBC 9.9 01/27/2021   HGB 14.1 01/27/2021   HCT 42.4 01/27/2021   MCV 93.8 01/27/2021   PLT 284 01/27/2021      Chemistry      Component Value Date/Time   NA 137 01/11/2021 1823   NA 137 09/24/2020 0944   K 5.0 01/11/2021 1823   CL 104 01/11/2021 1823   CO2 26 01/11/2021 1823   BUN 18 01/11/2021 1823   BUN 23 09/24/2020 0944   CREATININE 0.90 01/11/2021 1823   CREATININE 0.98 07/04/2012 1518      Component Value Date/Time   CALCIUM 9.8 01/11/2021 1823   ALKPHOS 57 09/24/2020 0944   AST 14 09/24/2020  0944   ALT 16 09/24/2020 0944   BILITOT 0.6 09/24/2020 0944       RADIOGRAPHIC STUDIES: DG Lumbar Spine Complete  Result Date: 01/11/2021 CLINICAL DATA:  Low-back pain for 3 days.  No injury. EXAM: LUMBAR  SPINE - COMPLETE 4+ VIEW COMPARISON:  10/05/2016. FINDINGS: No fracture, bone lesion or spondylolisthesis. Apparent bony fusion across the L4-L5 disc interspace, interspace narrowed. Mild loss of disc height at L2-L3 and L3-L4. There are endplate osteophytes from the lower thoracic spine through L4. Skeletal structures are demineralized. Atherosclerotic calcifications noted along the abdominal aorta. Multiple surgical vascular clips in the pelvis are consistent with a previous prostatectomy. IMPRESSION: 1. No fracture or acute finding. 2. Degenerative changes as detailed, without significant change compared to the prior lumbar spine radiographs. Electronically Signed   By: Lajean Manes M.D.   On: 01/11/2021 16:38   NM PET Image Initial (PI) Skull Base To Thigh (F-18 FDG)  Result Date: 01/27/2021 CLINICAL DATA:  Initial treatment strategy for non-small cell lung cancer. EXAM: NUCLEAR MEDICINE PET SKULL BASE TO THIGH TECHNIQUE: 13.6 mCi F-18 FDG was injected intravenously. Full-ring PET imaging was performed from the skull base to thigh after the radiotracer. CT data was obtained and used for attenuation correction and anatomic localization. Fasting blood glucose: 108 mg/dl COMPARISON:  Chest CT 01/02/2021. FINDINGS: Mediastinal blood pool activity: SUV max 3.5 Liver activity: SUV max NA NECK: No hypermetabolic lymph nodes in the neck. Asymmetric FDG accumulation identified in the right vocal cord which could be related to patient's speaking after radiotracer injection in the setting of left focal cord paralysis. Incidental CT findings: none CHEST: 2.8 cm medial left upper lobe pulmonary nodule identified on previous chest CT is hypermetabolic on PET imaging today with SUV max = 4.6. AP window  lymphadenopathy is similarly hypermetabolic with SUV max = 4.2. 11 mm short axis subcarinal lymph node is mildly enlarged with SUV max = 3.5. no other evidence for hypermetabolic metastatic disease in the chest. Incidental CT findings: There is mild atherosclerotic calcification of the abdominal aorta without aneurysm. ABDOMEN/PELVIS: Liver shows diffusely mottled radiotracer accumulation although there is a focal area of FDG accumulation above background mottled levels in the liver with SUV max = 5.5. No underlying CT correlate evident on noncontrast imaging. Otherwise, no evidence for hypermetabolic metastatic disease in the abdomen or pelvis. Incidental CT findings: Tiny hiatal hernia. Diffuse diverticular disease noted in the colon without diverticulitis. There is moderate atherosclerotic calcification of the abdominal aorta without aneurysm. SKELETON: Diffuse uptake identified in the region of the L5 vertebral body may be degenerative. There is no underlying lytic or sclerotic osseous abnormality on CT imaging. Incidental CT findings: Diffuse degenerative changes in the lumbar spine. IMPRESSION: 1. Hypermetabolic medial left upper lobe pulmonary nodule compatible with the patient's known primary neoplasm. Hypermetabolic AP window and subcarinal lymphadenopathy concerning for metastatic involvement. 2. Mottled FDG accumulation in the liver parenchyma with 1 particular small focus of FDG accumulation that is qualitatively above background liver parenchymal levels. This is indeterminate in no liver lesion evident at this location on noncontrast CT imaging. Dedicated abdominal CT or MRI could be used to further evaluate as clinically warranted. 3. Diffuse low level hypermetabolism in the region of the L5 vertebral body without underlying bony abnormality on CT imaging. This is likely degenerative or posttraumatic, but close attention on follow-up recommended. 4. Diffuse colonic diverticulosis without  diverticulitis. 5.  Aortic Atherosclerois (ICD10-170.0) Electronically Signed   By: Misty Stanley M.D.   On: 01/27/2021 10:43   CT L-SPINE NO CHARGE  Result Date: 01/11/2021 CLINICAL DATA:  Low back pain EXAM: CT LUMBAR SPINE WITHOUT CONTRAST TECHNIQUE: Multidetector CT imaging of the lumbar spine was performed without intravenous contrast administration. Multiplanar CT image  reconstructions were also generated. COMPARISON:  No prior dedicated CT of the lumbar spine. Correlation is made with radiographs from 01/11/2021 and CT abdomen pelvis 08/20/2019 FINDINGS: Segmentation: 5 lumbar-type vertebral bodies, with transitional anatomy at L5, which is completely sacralized on the left and partially sacralized on the right. Rudimentary disc space at L5-S1. Alignment: Normal. Vertebrae: No acute fracture or suspicious osseous lesion. Partial fusion across L4-L5. Paraspinal and other soft tissues: Aortic atherosclerosis, which extends into the branch vessels. Otherwise negative. Disc levels: T12-L1: No significant disc bulge. Mild facet arthropathy. No spinal canal stenosis or neural foraminal narrowing. L1-L2: No significant disc bulge. Mild facet arthropathy. No spinal canal stenosis or neural foraminal narrowing. L2-L3: Mild disc bulge. Moderate facet arthropathy. Ligamentum flavum hypertrophy. Mild spinal canal stenosis. Mild bilateral neural foraminal narrowing. L3-L4: No significant disc bulge. Moderate to severe facet arthropathy. Ligamentum flavum hypertrophy. Mild-to-moderate spinal canal stenosis. Mild bilateral neural foraminal narrowing. L4-L5: Osseous fusion across the disc space. No significant disc bulge. Severe facet arthropathy. No spinal canal stenosis. Moderate left neural foraminal narrowing. L5-S1: No significant disc bulge. No spinal canal stenosis or neural foraminal narrowing. IMPRESSION: 1. No acute fracture or traumatic listhesis. 2. Transitional anatomy at L5, which is completely sacralized  on the left and partially sacralized on the right. There is a rudimentary disc space at L5-S1. 3. L3-L4 mild-to-moderate spinal canal stenosis and mild bilateral neural foraminal narrowing. 4. L2-L3 mild spinal canal stenosis and mild bilateral neural foraminal narrowing. 5. L4-L5 moderate left neural foraminal narrowing. There is degenerative osseous fusion across this disc space. Electronically Signed   By: Merilyn Baba M.D.   On: 01/11/2021 21:01   CT Renal Stone Study  Result Date: 01/11/2021 CLINICAL DATA:  Flank pain. EXAM: CT ABDOMEN AND PELVIS WITHOUT CONTRAST TECHNIQUE: Multidetector CT imaging of the abdomen and pelvis was performed following the standard protocol without IV contrast. COMPARISON:  CT abdomen and pelvis 08/20/2019. FINDINGS: Lower chest: No acute abnormality. Hepatobiliary: No focal liver abnormality is seen. No gallstones, gallbladder wall thickening, or biliary dilatation. Pancreas: Unremarkable. No pancreatic ductal dilatation or surrounding inflammatory changes. Spleen: Normal in size without focal abnormality. Adrenals/Urinary Tract: There is a stable 2 cm cyst in the lower pole the left kidney. Otherwise, the kidneys, adrenal glands and bladder are within normal limits. Bladder is decompressed. Stomach/Bowel: Small hiatal hernia is present. Stomach is otherwise within normal limits. Appendix appears normal. No evidence of bowel wall thickening, distention, or inflammatory changes. There is diffuse colonic diverticulosis without evidence for acute diverticulitis. Vascular/Lymphatic: Aortic atherosclerosis. No enlarged abdominal or pelvic lymph nodes. Reproductive: Prostate gland is surgically absent. Other: No abdominal wall hernia or abnormality. No abdominopelvic ascites. There are surgical clips along the pelvic sidewalls bilaterally, unchanged. Musculoskeletal: Multilevel degenerative changes affect the spine. The bones are osteopenic. IMPRESSION: 1. No acute localizing  process in the abdomen or pelvis. 2. Status post prostatectomy. 3. Colonic diverticulosis. 4.  Aortic Atherosclerosis (ICD10-I70.0). Electronically Signed   By: Ronney Asters M.D.   On: 01/11/2021 21:01   CT Super D Chest Wo Contrast  Result Date: 01/05/2021 CLINICAL DATA:  Pulmonary nodule on CT neck. EXAM: CT CHEST WITHOUT CONTRAST TECHNIQUE: Multidetector CT imaging of the chest was performed using thin slice collimation for electromagnetic bronchoscopy planning purposes, without intravenous contrast. COMPARISON:  CT neck 12/11/2020. FINDINGS: Cardiovascular: Atherosclerotic calcification of the aorta, aortic valve and coronary arteries. Pulmonic trunk is enlarged. Heart is at the upper limits of normal in size. No pericardial effusion. Mediastinum/Nodes:  AP window nodal mass measures 2.5 x 3.4 cm. Hilar regions are difficult to evaluate without IV contrast. No axillary adenopathy. Esophagus is dilated and contains food debris. Lungs/Pleura: A 2.3 x 2.8 cm lobulated nodule in the medial aspect of the anterior segment left upper lobe (4/49) appears similar to 12/11/2020. It has a significant border of contact with the adjacent mediastinum. Bibasilar subsegmental atelectasis/scarring. No pleural fluid. Airway is unremarkable. Upper Abdomen: Liver margin is slightly irregular. Visualized portions of the liver, gallbladder, adrenal glands, kidneys, spleen, pancreas, stomach and bowel are grossly unremarkable. No upper abdominal adenopathy. Musculoskeletal: Degenerative changes in the spine. Flowing anterior osteophytosis in the thoracic spine. No worrisome lytic or sclerotic lesions. IMPRESSION: 1. Left upper lobe nodule and AP window adenopathy, findings highly worrisome for primary bronchogenic carcinoma. 2. Slight marginal irregularity of the liver raises suspicion for cirrhosis. 3. Aortic atherosclerosis (ICD10-I70.0). Coronary artery calcification. 4. Enlarged pulmonic trunk, indicative of pulmonary  arterial hypertension. Electronically Signed   By: Lorin Picket M.D.   On: 01/05/2021 09:22    ASSESSMENT: This is a very pleasant 88 years old white male with likely stage IIIa (T1c, N2, M0) lung cancer likely non-small cell lung cancer pending tissue diagnosis and presented with left upper lobe lung nodule in addition to AP window and subcarinal lymphadenopathy.  There was also indeterminate focus of mild hyper metabolic activity in the liver that need close monitoring.   PLAN: I had a lengthy discussion with the patient and his family today about his current condition and possible treatment options. I strongly recommend for the patient to consider proceeding with the bronchoscopy and EBUS for confirmation of his tissue diagnosis. If the patient has a confirmed diagnosis of lung cancer, he will need to complete the staging work-up by ordering MRI of the brain to rule out brain metastasis. If there is no concerning finding for metastatic disease outside the currently seen area, he may benefit from a course of concurrent chemoradiation with weekly carboplatin and paclitaxel but if the patient has an actionable mutation and he declines this option, he may be considered for treatment with targeted therapy. The patient and his family would like to take some time to think about their option and they will reach out to Dr. Verlee Monte with their decision. I will see him back for follow-up visit after his biopsy for more detailed discussion of his treatment options. The patient was advised to call immediately if he has any other concerning symptoms in the interval. The patient voices understanding of current disease status and treatment options and is in agreement with the current care plan.  All questions were answered. The patient knows to call the clinic with any problems, questions or concerns. We can certainly see the patient much sooner if necessary.  Thank you so much for allowing me to participate in  the care of [name]. I will continue to follow up the patient with you and assist in his care.  The total time spent in the appointment was 60 minutes.  Disclaimer: This note was dictated with voice recognition software. Similar sounding words can inadvertently be transcribed and may not be corrected upon review.   Eilleen Kempf January 27, 2021, 2:29 PM

## 2021-01-28 ENCOUNTER — Telehealth: Payer: Self-pay | Admitting: Student

## 2021-01-28 ENCOUNTER — Telehealth: Payer: Self-pay | Admitting: Medical Oncology

## 2021-01-28 NOTE — Telephone Encounter (Signed)
Lab results from yesterday reported to daughter .  She reported pt declined all COVID vaccines and flu vaccine.

## 2021-01-29 NOTE — Telephone Encounter (Signed)
Called and spoke with Lowella Bandy letting her know that it is not necessary for pt to have a covid shot prior to any biopsy procedures. Stated to her that pt will have a covid test done prior and she verbalized understanding. Nothing further needed.

## 2021-02-03 ENCOUNTER — Other Ambulatory Visit: Payer: Medicare Other

## 2021-02-03 DIAGNOSIS — C61 Malignant neoplasm of prostate: Secondary | ICD-10-CM | POA: Diagnosis not present

## 2021-02-03 DIAGNOSIS — Z192 Hormone resistant malignancy status: Secondary | ICD-10-CM | POA: Diagnosis not present

## 2021-02-09 ENCOUNTER — Telehealth: Payer: Self-pay | Admitting: Student

## 2021-02-09 DIAGNOSIS — C3490 Malignant neoplasm of unspecified part of unspecified bronchus or lung: Secondary | ICD-10-CM

## 2021-02-09 NOTE — Telephone Encounter (Signed)
Called and spoke with pt's daughter Lowella Bandy who stated that pt still has not made a decision on what to do about the mass in his chest and due to him not making a decision yet, she wants to know if it is necessary for pt to still have upcoming appt with Dr. Verlee Monte 1/25.  Also, she said that they did meet with Dr. Earlie Server and she stated that he would not be able to treat pt but said that it's possible that pt might be able to be treated with radiation and wanted to know what Dr. Verlee Monte thought about this if this might be able to be done if pt did not want to go through with the procedure by Dr. Verlee Monte.  Dr. Verlee Monte, please advise if pt does not have the bronch, could it be possible that a referral to radiation oncology be placed to see what they have to say about the mass in pt's chest and also if pt still needs to keep upcoming appt with you.

## 2021-02-09 NOTE — Telephone Encounter (Signed)
Has detailed questions. 1.) Is it necessary to keep 1/25 appt? Pt has not decided on the options that [facility] has given pt. 2.) Dr. Earlie Server had mentioned that radiologists could treat pt, and pt is wondering how to see radiologist to receive radition for cancer IF he does not want precedure doen by [facility].

## 2021-02-10 NOTE — Telephone Encounter (Signed)
I'll see if we can discuss his case at tumor board this week and let them know.

## 2021-02-11 DIAGNOSIS — C61 Malignant neoplasm of prostate: Secondary | ICD-10-CM | POA: Diagnosis not present

## 2021-02-11 DIAGNOSIS — Z5111 Encounter for antineoplastic chemotherapy: Secondary | ICD-10-CM | POA: Diagnosis not present

## 2021-02-16 NOTE — Telephone Encounter (Signed)
[name] daughter checking on message left. Valarie phone number is [phone].

## 2021-02-16 NOTE — Telephone Encounter (Signed)
Called and spoke with Mateo Flow. She was calling to check to see if we had heard any updates from Dr. Verlee Monte. I advised her that we have not but once we have, someone will call her with the recommendations.   Dr. Verlee Monte, can you please advise? Thanks!

## 2021-02-16 NOTE — Telephone Encounter (Signed)
At tumor board we discussed making a referral to radiation oncology for consideration of palliative radiation. I'm happy to talk about this as well as risks/benefits of bronchoscopy tomorrow in clinic.

## 2021-02-16 NOTE — Telephone Encounter (Signed)
Called and spoke with Lowella Bandy. She verbalized understanding. Since the time I sent the message this morning and this afternoon, patient's wife cancelled the visit on Wednesday. Per Lowella Bandy, the patient is ok with moving forward with the referral to radiation oncology.   She wanted to know if we could go ahead and place the referral or did the patient need to get scheduled again to see Dr. Verlee Monte.   Dr. Verlee Monte, can you please advise? Thanks!

## 2021-02-18 ENCOUNTER — Ambulatory Visit: Payer: Medicare Other | Admitting: Student

## 2021-02-18 NOTE — Telephone Encounter (Signed)
Referral placed to radiation oncology

## 2021-02-19 NOTE — Telephone Encounter (Signed)
Called and spoke with pt's daughter [name] letting her know that the referral has been placed for pt for radiation oncology and she verbalized understanding. Nothing further needed.

## 2021-02-23 NOTE — Progress Notes (Signed)
Radiation Oncology         [phone] ________________________________  Name: [name]        MRN: [id number]  Date of Service: 02/24/2021 DOB: 16-Mar-1932  KZ:[id number], Rodney Gash, Holmes  Rodney Hurter, Holmes     REFERRING PHYSICIAN: Maryjane Hurter, Holmes   DIAGNOSIS: The encounter diagnosis was Mass of upper lobe of left lung.   HISTORY OF PRESENT ILLNESS: [name] is a 88 y.o. male seen at the request of [name] for likely Stage III, NSCLC of the LUL. The patient was starting to have hoarseness of his voice and this prompted evaluation with his PCP.  He had also had dizzy spells and cough.  He had also had an MRI of the brain without contrast in June 2022 for dizziness that did not show any acute intracranial disease but remote lacunar infarcts.  CTs in August 2020 to the maxillofacial system showed opacification of some ethmoid air cells right greater than left a CT soft tissue of the neck showed medialization of the left vocal cord with mild dilatation of the left piriform sinus consistent with vocal cord paralysis and a 1.9 cm left upper lobe nodule as well as atherosclerotic disease.  He was referred to pulmonary medicine and a CT super D on 01/02/2021 showed a left upper lobe nodule with AP window adenopathy marginal irregularity of the liver suspicious for cirrhotic change and enlargement of the pulmonic trunk with aortic atherosclerosis also noted.  He underwent PET imaging on 01/23/2021 showing hypermetabolic change in the left upper lobe nodule, and hypermetabolism within the AP window and subcarinal lymph nodes.  There were findings along the liver parenchyma with a small focus of FDG accumulation above background level but this was felt to be indeterminant.  He is felt to be high risk for bronchoscopic evaluation, and is seen to discuss options of  radiotherapy.    PREVIOUS RADIATION THERAPY: Yes   1997: Details unknown but he received radiotherapy to the prostatic  fossa.   PAST MEDICAL HISTORY:  Past Medical History:  Diagnosis Date   Aphasia    Arthritis    BNC (bladder neck contracture)    BPPV (benign paroxysmal positional vertigo)    Cataract    bilateral   Diverticulosis    H/O diarrhea SECONDARY TO RADIATION THERAPY --  INTERMITANT  DIARRHEA   Hematuria    History of hemorrhagic cystitis    SECONDARY RADIATION THERAPY 1997   History of prostate cancer CURRENTLY  ELEVATED PSA--  LUPRON INJECTIONS   S/P PROSTATECTOMY AND RADIATION THERAPY  1997   Hyperlipidemia    Hypertension    Hypothyroidism    Radiation cystitis    Radiation proctitis    Urinary incontinence    Urinary retention    Vitamin D deficiency        PAST SURGICAL HISTORY: Past Surgical History:  Procedure Laterality Date   CATARACT EXTRACTION W/ INTRAOCULAR LENS IMPLANT Right    COLONOSCOPY W/ POLYPECTOMY  2005; 08/11/2010   2005: 1 cm hyperplastic sigmoid polyp, Dr. Wynetta Holmes 2012: hyperplastic splenic flexure polyp -1 cm, diverticulosis, radiation proctitis, anal stenosis   CYSTO/ FULGERATION OF BLEEDERS/ RESECTION BLADDER NECK CONTRACTURE  07-06-2004   BNC AND RADIATION CYSTITIS   EYE SURGERY     KNEE ARTHROSCOPY Right    LUMBAR DISC SURGERY  1996   L4 -- L5   PROSTATECTOMY  1997   SPINE SURGERY     TRANSURETHRAL RESECTION OF BLADDER NECK N/A  06/23/2012   Procedure: TRANSURETHRAL RESECTION OF BLADDER NECK CONTRACTURE WITH GYRUS;  Surgeon: [name];  Location: [facility];  Service: Urology;  Laterality: N/A;   TRANSURETHRAL RESECTION OF BLADDER TUMOR WITH GYRUS (TURBT-GYRUS) N/A 06/01/2013   Procedure: TRANSURETHRAL RESECTION OF BLADDER NECK CONTRACTURE WITH GYRUS (TURBT-GYRUS) AND INJECTION OF KENALOG;  Surgeon: [name];  Location: [facility];  Service: Urology;  Laterality: N/A;     FAMILY HISTORY:  Family History  Problem Relation Age of Onset   Prostate cancer Brother    Kidney disease Brother     Congestive Heart Failure Mother    CVA Mother 50   Pancreatitis Father    Pneumonia Father    Cancer Sister        breast   Obesity Sister    Early death Sister        appendicitis   Appendicitis Sister 63       rupture   Cancer Sister        breast   Kidney disease Sister    Alzheimer's disease Brother    Hip fracture Brother      SOCIAL HISTORY:  reports that he quit smoking about 58 years ago. His smoking use included cigarettes. He quit smokeless tobacco use about 58 years ago.  His smokeless tobacco use included chew. He reports that he does not drink alcohol and does not use drugs.  The patient is married and resides in [place].  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 He is accompanied by his daughter and his son and wife are in the car in the [place] parking lot.    ALLERGIES: Caffeine and Codeine   MEDICATIONS:  Current Outpatient Medications  Medication Sig Dispense Refill   acetaminophen (TYLENOL) 325 MG tablet Take 650 mg by mouth every 6 (six) hours as needed for mild pain.     amLODipine (NORVASC) 10 MG tablet Take 1 tablet (10 mg total) by mouth daily. 90 tablet 3   aspirin 325 MG tablet Take 1 tablet (325 mg total) by mouth daily.     atorvastatin (LIPITOR) 40 MG tablet Take 1 tablet (40 mg total) by mouth daily. 90 tablet 1   Cholecalciferol (VITAMIN D3) 5000 UNITS CAPS Take 1 capsule by mouth daily.     Elastic Bandages & Supports (V-2 HIGH COMPRESSION HOSE) MISC Wear daily when up walking around 1 each 0   FLAXSEED, LINSEED, PO Take 30 mLs by mouth every morning. GRINDS FLAX SEEDS AND PUTS IN 1/4 CUP OF WATER     GARLIC PO Take 2 capsules by mouth daily.     Leuprolide Acetate, 6 Month, (LUPRON) 45 MG injection Inject 45 mg into the muscle every 6 (six) months.     levothyroxine (SYNTHROID) 88 MCG tablet TAKE 1 TABLET BY MOUTH ONCE DAILY BEFORE BREAKFAST 90 tablet 2   lisinopril (ZESTRIL) 40 MG tablet Take 1 tablet (40  mg total) by mouth daily. 90 tablet 1   loratadine (CLARITIN) 10 MG tablet Take 10 mg by mouth daily.     Magnesium 300 MG CAPS Take 1 capsule by mouth daily.     Multiple Vitamins-Minerals (CENTRUM SILVER ULTRA MENS PO) Take 1 tablet by mouth daily.     nabumetone (RELAFEN) 500 MG tablet Take 1 tablet (500 mg total) by mouth 2 (two) times daily. 60 tablet 2   Probiotic Product (PROBIOTIC DAILY PO) Take 1 capsule  by mouth daily.     triamcinolone cream (KENALOG) 0.1 % Apply topically 2 (two) times daily.     methocarbamol (ROBAXIN) 500 MG tablet Take 1 tablet (500 mg total) by mouth 2 (two) times daily as needed for muscle spasms. (Patient not taking: Reported on 01/27/2021) 10 tablet 0   No current facility-administered medications for this encounter.     REVIEW OF SYSTEMS: On review of systems, the patient reports that he is doing okay. While he does not have any shortness of breath or chest pain, he does have mild hemoptysis, and notes occasional blood in his sputum with coughing. He does have a long standing history of low back pain as a result of prior injury and arthritis and he takes tylenol daily to help with this. No other complaints are noted.       PHYSICAL EXAM:  Wt Readings from Last 3 Encounters:  02/24/21 242 lb 6.4 oz (110 kg)  01/27/21 244 lb 3.2 oz (110.8 kg)  01/06/21 250 lb (113.4 kg)   Temp Readings from Last 3 Encounters:  02/24/21 97.8 F (36.6 C)  01/27/21 97.9 F (36.6 C) (Tympanic)  01/11/21 98.7 F (37.1 C) (Oral)   BP Readings from Last 3 Encounters:  02/24/21 138/65  01/27/21 (!) 162/63  01/11/21 (!) 151/67   Pulse Readings from Last 3 Encounters:  02/24/21 78  01/27/21 83  01/11/21 70   Pain Assessment Pain Score: 5  Pain Loc: Back/10  In general this is a well appearing Caucasian male in no acute distress.  He's alert and oriented x4 and appropriate throughout the examination. Cardiopulmonary assessment is negative for acute distress and he  exhibits normal effort.     ECOG = 1  0 - Asymptomatic (Fully active, able to carry on all predisease activities without restriction)  1 - Symptomatic but completely ambulatory (Restricted in physically strenuous activity but ambulatory and able to carry out work of a light or sedentary nature. For example, light housework, office work)  2 - Symptomatic, <50% in bed during the day (Ambulatory and capable of all self care but unable to carry out any work activities. Up and about more than 50% of waking hours)  3 - Symptomatic, >50% in bed, but not bedbound (Capable of only limited self-care, confined to bed or chair 50% or more of waking hours)  4 - Bedbound (Completely disabled. Cannot carry on any self-care. Totally confined to bed or chair)  5 - Death   Eustace Pen MM, Creech RH, Tormey DC, et al. [phone]). "Toxicity and response criteria of the [facility]". [place] Oncol. 5 (6): 649-55    LABORATORY DATA:  Lab Results  Component Value Date   WBC 9.9 01/27/2021   HGB 14.1 01/27/2021   HCT 42.4 01/27/2021   MCV 93.8 01/27/2021   PLT 284 01/27/2021   Lab Results  Component Value Date   NA 133 (L) 01/27/2021   K 4.5 01/27/2021   CL 103 01/27/2021   CO2 24 01/27/2021   Lab Results  Component Value Date   ALT 34 01/27/2021   AST 27 01/27/2021   ALKPHOS 99 01/27/2021   BILITOT 0.4 01/27/2021      RADIOGRAPHY: No results found.     IMPRESSION/PLAN: 1. Putative Stage IIIA, cT1bNaM0, NSCLC of the LUL. Dr. Lisbeth Renshaw discusses the patient's course and imaging results to date, and reviews the nature of locally advanced lung cancer.  Dr. Lisbeth Renshaw discusses the options of radiotherapy, and the  limitations of proceeding without tissue confirmation of what appears malignant by imaging. We discussed the risks, benefits, short, and long term effects of radiotherapy, as well as the palliative intent, and the patient is interested in proceeding. We reviewed the options  for biopsy but he had such a delayed mental fog after prior surgery that lingered for months, he is against any procedures with anesthesia, or biopsy of any kind. Dr. Lisbeth Renshaw discusses the delivery and logistics of radiotherapy and anticipates a course of 2-3  weeks of radiotherapy to the chest. Written consent is obtained and placed in the chart, a copy was provided to the patient. He will be contacted by simulation staff to coordinate planning of radiotherapy.  In a visit lasting 60 minutes, greater than 50% of the time was spent face to face discussing the patient's condition, in preparation for the discussion, and coordinating the patient's care.   The above documentation reflects my direct findings during this shared patient visit. Please see the separate note by Dr. Lisbeth Renshaw on this date for the remainder of the patient's plan of care.    Carola Rhine, [facility]   **Disclaimer: This note was dictated with voice recognition software. Similar sounding words can inadvertently be transcribed and this note may contain transcription errors which may not have been corrected upon publication of note.**

## 2021-02-23 NOTE — Progress Notes (Signed)
Thoracic Location of Tumor / Histology: Left Upper Lobe Lung- NSCLC  Patient presented to his PCP with complaints of hoarseness of his voice, cough, and dizziness.  PET 64/68/0321: Hypermetabolic medial left upper lobe pulmonary nodule compatible with the patient is known left upper lobe suspicious nodule.  There was also hypermetabolic AP window of and subcarinal lymphadenopathy concerning for metastatic involvement.  There was mottled FDG accumulation in the liver parenchyma levels and this is indeterminate.  CT Super D Chest 01/02/2021: 2.3 x 2.8 cm lobulated nodule in the medial aspect of the anterior segment of the left upper lobe with significant border of contact with the adjacent mediastinum.  There was also AP window nodal mass measuring 2.5 x 3.4 cm.  There was some slight marginal irregularity of the liver raises suspicious for cirrhosis.  CT Neck 12/11/2020: Medialization of the left vocal cord with mild dilatation of the left pyriform sinus consistent with vocal cord paralysis.  There was also a 1.9 cm left upper lobe pulmonary nodule concerning for malignancy.  Biopsy:  No biopsy obtained.  Tobacco/Marijuana/Snuff/ETOH use: Former smoker, quit 50 years ago.  Past/Anticipated interventions by cardiothoracic surgery, if any:   Past/Anticipated interventions by medical oncology, if any:  Dr. Julien Nordmann 01/27/2021 -I strongly recommend for the patient to consider proceeding with the bronchoscopy and EBUS for confirmation of his tissue diagnosis. -If the patient has a confirmed diagnosis of lung cancer, he will need to complete the staging work-up by ordering MRI of the brain to rule out brain metastasis. -If there is no concerning finding for metastatic disease outside the currently seen area, he may benefit from a course of concurrent chemoradiation with weekly carboplatin and paclitaxel but if the patient has an actionable mutation and he declines this option, he may be considered for  treatment with targeted therapy. -The patient and his family would like to take some time to think about their option and they will reach out to Dr. Verlee Monte with their decision. -I will see him back for follow-up visit after his biopsy for more detailed discussion of his treatment options.   Signs/Symptoms Weight changes, if any: Steady Respiratory complaints, if any: None Hemoptysis, if any: Reports productive cough with clear/yellow phlegm, has occasional streak of blood. Pain issues, if any:  None Voice remains hoarse.  SAFETY ISSUES: Prior radiation? Prostate  30 years ago Pacemaker/ICD? No Possible current pregnancy? N/a Is the patient on methotrexate? No  Current Complaints / other details:

## 2021-02-24 ENCOUNTER — Encounter: Payer: Self-pay | Admitting: Radiation Oncology

## 2021-02-24 ENCOUNTER — Ambulatory Visit
Admission: RE | Admit: 2021-02-24 | Discharge: 2021-02-24 | Disposition: A | Discharge status: Home / Self Care | Payer: Medicare Other | Source: Ambulatory Visit | Attending: Radiation Oncology | Admitting: Radiation Oncology

## 2021-02-24 ENCOUNTER — Other Ambulatory Visit: Payer: Self-pay

## 2021-02-24 VITALS — BP 138/65 | HR 78 | Temp 97.8°F | Resp 20 | Ht 72.0 in | Wt 242.4 lb

## 2021-02-24 DIAGNOSIS — E559 Vitamin D deficiency, unspecified: Secondary | ICD-10-CM | POA: Insufficient documentation

## 2021-02-24 DIAGNOSIS — E785 Hyperlipidemia, unspecified: Secondary | ICD-10-CM | POA: Insufficient documentation

## 2021-02-24 DIAGNOSIS — Z79899 Other long term (current) drug therapy: Secondary | ICD-10-CM | POA: Insufficient documentation

## 2021-02-24 DIAGNOSIS — Z87891 Personal history of nicotine dependence: Secondary | ICD-10-CM | POA: Insufficient documentation

## 2021-02-24 DIAGNOSIS — R918 Other nonspecific abnormal finding of lung field: Secondary | ICD-10-CM

## 2021-02-24 DIAGNOSIS — I10 Essential (primary) hypertension: Secondary | ICD-10-CM | POA: Insufficient documentation

## 2021-02-24 DIAGNOSIS — Z923 Personal history of irradiation: Secondary | ICD-10-CM | POA: Insufficient documentation

## 2021-02-24 DIAGNOSIS — C3412 Malignant neoplasm of upper lobe, left bronchus or lung: Secondary | ICD-10-CM | POA: Diagnosis not present

## 2021-02-24 DIAGNOSIS — Z791 Long term (current) use of non-steroidal anti-inflammatories (NSAID): Secondary | ICD-10-CM | POA: Diagnosis not present

## 2021-02-24 DIAGNOSIS — E039 Hypothyroidism, unspecified: Secondary | ICD-10-CM | POA: Diagnosis not present

## 2021-02-24 DIAGNOSIS — Z7982 Long term (current) use of aspirin: Secondary | ICD-10-CM | POA: Insufficient documentation

## 2021-02-27 ENCOUNTER — Other Ambulatory Visit: Payer: Self-pay

## 2021-02-27 ENCOUNTER — Ambulatory Visit
Admission: RE | Admit: 2021-02-27 | Discharge: 2021-02-27 | Disposition: A | Discharge status: Home / Self Care | Payer: Medicare Other | Source: Ambulatory Visit | Attending: Radiation Oncology | Admitting: Radiation Oncology

## 2021-02-27 DIAGNOSIS — C3412 Malignant neoplasm of upper lobe, left bronchus or lung: Secondary | ICD-10-CM | POA: Insufficient documentation

## 2021-02-27 DIAGNOSIS — R918 Other nonspecific abnormal finding of lung field: Secondary | ICD-10-CM | POA: Diagnosis not present

## 2021-02-27 DIAGNOSIS — Z510 Encounter for antineoplastic radiation therapy: Secondary | ICD-10-CM | POA: Diagnosis not present

## 2021-02-27 DIAGNOSIS — Z87891 Personal history of nicotine dependence: Secondary | ICD-10-CM | POA: Diagnosis not present

## 2021-03-05 DIAGNOSIS — Z87891 Personal history of nicotine dependence: Secondary | ICD-10-CM | POA: Diagnosis not present

## 2021-03-05 DIAGNOSIS — Z510 Encounter for antineoplastic radiation therapy: Secondary | ICD-10-CM | POA: Diagnosis not present

## 2021-03-05 DIAGNOSIS — R918 Other nonspecific abnormal finding of lung field: Secondary | ICD-10-CM | POA: Diagnosis not present

## 2021-03-05 DIAGNOSIS — C3412 Malignant neoplasm of upper lobe, left bronchus or lung: Secondary | ICD-10-CM | POA: Diagnosis not present

## 2021-03-09 ENCOUNTER — Other Ambulatory Visit: Payer: Self-pay

## 2021-03-09 ENCOUNTER — Ambulatory Visit
Admission: RE | Admit: 2021-03-09 | Discharge: 2021-03-09 | Disposition: A | Discharge status: Home / Self Care | Payer: Medicare Other | Source: Ambulatory Visit | Attending: Radiation Oncology | Admitting: Radiation Oncology

## 2021-03-09 DIAGNOSIS — R918 Other nonspecific abnormal finding of lung field: Secondary | ICD-10-CM | POA: Diagnosis not present

## 2021-03-09 DIAGNOSIS — Z87891 Personal history of nicotine dependence: Secondary | ICD-10-CM | POA: Diagnosis not present

## 2021-03-09 DIAGNOSIS — C3412 Malignant neoplasm of upper lobe, left bronchus or lung: Secondary | ICD-10-CM | POA: Diagnosis not present

## 2021-03-09 DIAGNOSIS — Z510 Encounter for antineoplastic radiation therapy: Secondary | ICD-10-CM | POA: Diagnosis not present

## 2021-03-10 ENCOUNTER — Other Ambulatory Visit: Payer: Self-pay

## 2021-03-10 ENCOUNTER — Ambulatory Visit
Admission: RE | Admit: 2021-03-10 | Discharge: 2021-03-10 | Disposition: A | Discharge status: Home / Self Care | Payer: Medicare Other | Source: Ambulatory Visit | Attending: Radiation Oncology | Admitting: Radiation Oncology

## 2021-03-10 DIAGNOSIS — Z510 Encounter for antineoplastic radiation therapy: Secondary | ICD-10-CM | POA: Diagnosis not present

## 2021-03-10 DIAGNOSIS — Z87891 Personal history of nicotine dependence: Secondary | ICD-10-CM | POA: Diagnosis not present

## 2021-03-10 DIAGNOSIS — C3412 Malignant neoplasm of upper lobe, left bronchus or lung: Secondary | ICD-10-CM | POA: Diagnosis not present

## 2021-03-10 DIAGNOSIS — R918 Other nonspecific abnormal finding of lung field: Secondary | ICD-10-CM | POA: Diagnosis not present

## 2021-03-11 ENCOUNTER — Ambulatory Visit
Admission: RE | Admit: 2021-03-11 | Discharge: 2021-03-11 | Disposition: A | Discharge status: Home / Self Care | Payer: Medicare Other | Source: Ambulatory Visit | Attending: Radiation Oncology | Admitting: Radiation Oncology

## 2021-03-11 DIAGNOSIS — Z510 Encounter for antineoplastic radiation therapy: Secondary | ICD-10-CM | POA: Diagnosis not present

## 2021-03-11 DIAGNOSIS — Z87891 Personal history of nicotine dependence: Secondary | ICD-10-CM | POA: Diagnosis not present

## 2021-03-11 DIAGNOSIS — C3412 Malignant neoplasm of upper lobe, left bronchus or lung: Secondary | ICD-10-CM | POA: Diagnosis not present

## 2021-03-11 DIAGNOSIS — R918 Other nonspecific abnormal finding of lung field: Secondary | ICD-10-CM | POA: Diagnosis not present

## 2021-03-12 ENCOUNTER — Other Ambulatory Visit: Payer: Self-pay | Admitting: Radiation Oncology

## 2021-03-12 ENCOUNTER — Ambulatory Visit
Admission: RE | Admit: 2021-03-12 | Discharge: 2021-03-12 | Disposition: A | Discharge status: Home / Self Care | Payer: Medicare Other | Source: Ambulatory Visit | Attending: Radiation Oncology | Admitting: Radiation Oncology

## 2021-03-12 ENCOUNTER — Other Ambulatory Visit: Payer: Self-pay

## 2021-03-12 DIAGNOSIS — C3412 Malignant neoplasm of upper lobe, left bronchus or lung: Secondary | ICD-10-CM | POA: Diagnosis not present

## 2021-03-12 DIAGNOSIS — Z87891 Personal history of nicotine dependence: Secondary | ICD-10-CM | POA: Diagnosis not present

## 2021-03-12 DIAGNOSIS — R918 Other nonspecific abnormal finding of lung field: Secondary | ICD-10-CM | POA: Diagnosis not present

## 2021-03-12 DIAGNOSIS — Z510 Encounter for antineoplastic radiation therapy: Secondary | ICD-10-CM | POA: Diagnosis not present

## 2021-03-12 MED ORDER — TRAMADOL HCL 50 MG PO TABS
50.0000 mg | ORAL_TABLET | Freq: Four times a day (QID) | ORAL | 0 refills | Status: DC | PRN
Start: 2021-03-12 — End: 2021-09-17

## 2021-03-12 MED ORDER — SONAFINE EX EMUL
1.0000 "application " | Freq: Once | CUTANEOUS | Status: AC
  Administered 2021-03-12: 1 via TOPICAL

## 2021-03-12 NOTE — Progress Notes (Signed)
Pt here for patient teaching.  Pt given Radiation and You booklet, skin care instructions, and Sonafine.  Reviewed areas of pertinence such as fatigue, hair loss, skin changes, throat changes, cough, and shortness of breath . Pt able to give teach back of to pat skin and use unscented/gentle soap,apply Sonafine bid. Pt verbalizes understanding of information given and will contact nursing with any questions or concerns.     Gloriajean Dell. Leonie Green, BSN

## 2021-03-13 ENCOUNTER — Ambulatory Visit
Admission: RE | Admit: 2021-03-13 | Discharge: 2021-03-13 | Disposition: A | Discharge status: Home / Self Care | Payer: Medicare Other | Source: Ambulatory Visit | Attending: Radiation Oncology | Admitting: Radiation Oncology

## 2021-03-13 DIAGNOSIS — C3412 Malignant neoplasm of upper lobe, left bronchus or lung: Secondary | ICD-10-CM | POA: Diagnosis not present

## 2021-03-13 DIAGNOSIS — Z87891 Personal history of nicotine dependence: Secondary | ICD-10-CM | POA: Diagnosis not present

## 2021-03-13 DIAGNOSIS — Z510 Encounter for antineoplastic radiation therapy: Secondary | ICD-10-CM | POA: Diagnosis not present

## 2021-03-13 DIAGNOSIS — R918 Other nonspecific abnormal finding of lung field: Secondary | ICD-10-CM | POA: Diagnosis not present

## 2021-03-16 ENCOUNTER — Ambulatory Visit
Admission: RE | Admit: 2021-03-16 | Discharge: 2021-03-16 | Disposition: A | Discharge status: Home / Self Care | Payer: Medicare Other | Source: Ambulatory Visit | Attending: Radiation Oncology | Admitting: Radiation Oncology

## 2021-03-16 ENCOUNTER — Other Ambulatory Visit: Payer: Self-pay

## 2021-03-16 DIAGNOSIS — C3412 Malignant neoplasm of upper lobe, left bronchus or lung: Secondary | ICD-10-CM | POA: Diagnosis not present

## 2021-03-16 DIAGNOSIS — Z510 Encounter for antineoplastic radiation therapy: Secondary | ICD-10-CM | POA: Diagnosis not present

## 2021-03-16 DIAGNOSIS — R918 Other nonspecific abnormal finding of lung field: Secondary | ICD-10-CM | POA: Diagnosis not present

## 2021-03-16 DIAGNOSIS — Z87891 Personal history of nicotine dependence: Secondary | ICD-10-CM | POA: Diagnosis not present

## 2021-03-17 ENCOUNTER — Ambulatory Visit
Admission: RE | Admit: 2021-03-17 | Discharge: 2021-03-17 | Disposition: A | Discharge status: Home / Self Care | Payer: Medicare Other | Source: Ambulatory Visit | Attending: Radiation Oncology | Admitting: Radiation Oncology

## 2021-03-17 DIAGNOSIS — C3412 Malignant neoplasm of upper lobe, left bronchus or lung: Secondary | ICD-10-CM | POA: Diagnosis not present

## 2021-03-17 DIAGNOSIS — Z510 Encounter for antineoplastic radiation therapy: Secondary | ICD-10-CM | POA: Diagnosis not present

## 2021-03-17 DIAGNOSIS — R918 Other nonspecific abnormal finding of lung field: Secondary | ICD-10-CM | POA: Diagnosis not present

## 2021-03-17 DIAGNOSIS — Z87891 Personal history of nicotine dependence: Secondary | ICD-10-CM | POA: Diagnosis not present

## 2021-03-18 ENCOUNTER — Ambulatory Visit
Admission: RE | Admit: 2021-03-18 | Discharge: 2021-03-18 | Disposition: A | Discharge status: Home / Self Care | Payer: Medicare Other | Source: Ambulatory Visit | Attending: Radiation Oncology | Admitting: Radiation Oncology

## 2021-03-18 ENCOUNTER — Other Ambulatory Visit: Payer: Self-pay

## 2021-03-18 DIAGNOSIS — Z87891 Personal history of nicotine dependence: Secondary | ICD-10-CM | POA: Diagnosis not present

## 2021-03-18 DIAGNOSIS — R918 Other nonspecific abnormal finding of lung field: Secondary | ICD-10-CM | POA: Diagnosis not present

## 2021-03-18 DIAGNOSIS — Z510 Encounter for antineoplastic radiation therapy: Secondary | ICD-10-CM | POA: Diagnosis not present

## 2021-03-18 DIAGNOSIS — C3412 Malignant neoplasm of upper lobe, left bronchus or lung: Secondary | ICD-10-CM | POA: Diagnosis not present

## 2021-03-18 NOTE — Progress Notes (Signed)
Spoke with the patients wife who reports that [name] is having some pain in his stomach after eating.  This has been going on for the past week with little relief with tums.    He has completed 8/15 fractions to his Left Lung.  After discussion with the PA I let her know that we have called in a prescription for Carafate to [facility].  She verbalized understanding.  [name]. Leonie Green, BSN

## 2021-03-19 ENCOUNTER — Other Ambulatory Visit: Payer: Self-pay | Admitting: Radiation Oncology

## 2021-03-19 ENCOUNTER — Ambulatory Visit
Admission: RE | Admit: 2021-03-19 | Discharge: 2021-03-19 | Disposition: A | Discharge status: Home / Self Care | Payer: Medicare Other | Source: Ambulatory Visit | Attending: Radiation Oncology | Admitting: Radiation Oncology

## 2021-03-19 DIAGNOSIS — C3412 Malignant neoplasm of upper lobe, left bronchus or lung: Secondary | ICD-10-CM | POA: Diagnosis not present

## 2021-03-19 DIAGNOSIS — Z87891 Personal history of nicotine dependence: Secondary | ICD-10-CM | POA: Diagnosis not present

## 2021-03-19 DIAGNOSIS — R918 Other nonspecific abnormal finding of lung field: Secondary | ICD-10-CM | POA: Diagnosis not present

## 2021-03-19 DIAGNOSIS — Z510 Encounter for antineoplastic radiation therapy: Secondary | ICD-10-CM | POA: Diagnosis not present

## 2021-03-19 MED ORDER — SUCRALFATE 1 G PO TABS: 1.0000 g | ORAL_TABLET | Freq: Three times a day (TID) | ORAL | 0 refills | Status: DC

## 2021-03-20 ENCOUNTER — Ambulatory Visit
Admission: RE | Admit: 2021-03-20 | Discharge: 2021-03-20 | Disposition: A | Discharge status: Home / Self Care | Payer: Medicare Other | Source: Ambulatory Visit | Attending: Radiation Oncology | Admitting: Radiation Oncology

## 2021-03-20 ENCOUNTER — Other Ambulatory Visit: Payer: Self-pay

## 2021-03-20 DIAGNOSIS — C3412 Malignant neoplasm of upper lobe, left bronchus or lung: Secondary | ICD-10-CM | POA: Diagnosis not present

## 2021-03-20 DIAGNOSIS — R918 Other nonspecific abnormal finding of lung field: Secondary | ICD-10-CM | POA: Diagnosis not present

## 2021-03-20 DIAGNOSIS — Z87891 Personal history of nicotine dependence: Secondary | ICD-10-CM | POA: Diagnosis not present

## 2021-03-20 DIAGNOSIS — Z510 Encounter for antineoplastic radiation therapy: Secondary | ICD-10-CM | POA: Diagnosis not present

## 2021-03-23 ENCOUNTER — Ambulatory Visit
Admission: RE | Admit: 2021-03-23 | Discharge: 2021-03-23 | Disposition: A | Discharge status: Home / Self Care | Payer: Medicare Other | Source: Ambulatory Visit | Attending: Radiation Oncology | Admitting: Radiation Oncology

## 2021-03-23 DIAGNOSIS — R918 Other nonspecific abnormal finding of lung field: Secondary | ICD-10-CM | POA: Diagnosis not present

## 2021-03-23 DIAGNOSIS — C3412 Malignant neoplasm of upper lobe, left bronchus or lung: Secondary | ICD-10-CM | POA: Diagnosis not present

## 2021-03-23 DIAGNOSIS — Z87891 Personal history of nicotine dependence: Secondary | ICD-10-CM | POA: Diagnosis not present

## 2021-03-23 DIAGNOSIS — Z510 Encounter for antineoplastic radiation therapy: Secondary | ICD-10-CM | POA: Diagnosis not present

## 2021-03-24 ENCOUNTER — Ambulatory Visit
Admission: RE | Admit: 2021-03-24 | Discharge: 2021-03-24 | Disposition: A | Discharge status: Home / Self Care | Payer: Medicare Other | Source: Ambulatory Visit | Attending: Radiation Oncology | Admitting: Radiation Oncology

## 2021-03-24 DIAGNOSIS — R918 Other nonspecific abnormal finding of lung field: Secondary | ICD-10-CM | POA: Diagnosis not present

## 2021-03-24 DIAGNOSIS — C3412 Malignant neoplasm of upper lobe, left bronchus or lung: Secondary | ICD-10-CM | POA: Diagnosis not present

## 2021-03-24 DIAGNOSIS — Z87891 Personal history of nicotine dependence: Secondary | ICD-10-CM | POA: Diagnosis not present

## 2021-03-24 DIAGNOSIS — Z510 Encounter for antineoplastic radiation therapy: Secondary | ICD-10-CM | POA: Diagnosis not present

## 2021-03-25 ENCOUNTER — Other Ambulatory Visit: Payer: Self-pay

## 2021-03-25 ENCOUNTER — Ambulatory Visit
Admission: RE | Admit: 2021-03-25 | Discharge: 2021-03-25 | Disposition: A | Discharge status: Home / Self Care | Payer: Medicare Other | Source: Ambulatory Visit | Attending: Radiation Oncology | Admitting: Radiation Oncology

## 2021-03-25 DIAGNOSIS — C3412 Malignant neoplasm of upper lobe, left bronchus or lung: Secondary | ICD-10-CM | POA: Insufficient documentation

## 2021-03-25 DIAGNOSIS — Z510 Encounter for antineoplastic radiation therapy: Secondary | ICD-10-CM | POA: Diagnosis not present

## 2021-03-25 DIAGNOSIS — Z87891 Personal history of nicotine dependence: Secondary | ICD-10-CM | POA: Diagnosis not present

## 2021-03-25 DIAGNOSIS — R918 Other nonspecific abnormal finding of lung field: Secondary | ICD-10-CM | POA: Diagnosis not present

## 2021-03-26 ENCOUNTER — Ambulatory Visit
Admission: RE | Admit: 2021-03-26 | Discharge: 2021-03-26 | Disposition: A | Discharge status: Home / Self Care | Payer: Medicare Other | Source: Ambulatory Visit | Attending: Radiation Oncology | Admitting: Radiation Oncology

## 2021-03-26 DIAGNOSIS — Z510 Encounter for antineoplastic radiation therapy: Secondary | ICD-10-CM | POA: Diagnosis not present

## 2021-03-26 DIAGNOSIS — R918 Other nonspecific abnormal finding of lung field: Secondary | ICD-10-CM | POA: Diagnosis not present

## 2021-03-26 DIAGNOSIS — Z87891 Personal history of nicotine dependence: Secondary | ICD-10-CM | POA: Diagnosis not present

## 2021-03-26 DIAGNOSIS — C3412 Malignant neoplasm of upper lobe, left bronchus or lung: Secondary | ICD-10-CM | POA: Diagnosis not present

## 2021-03-27 ENCOUNTER — Ambulatory Visit
Admission: RE | Admit: 2021-03-27 | Discharge: 2021-03-27 | Disposition: A | Discharge status: Home / Self Care | Payer: Medicare Other | Source: Ambulatory Visit | Attending: Radiation Oncology | Admitting: Radiation Oncology

## 2021-03-27 ENCOUNTER — Other Ambulatory Visit: Payer: Self-pay

## 2021-03-27 ENCOUNTER — Encounter: Payer: Self-pay | Admitting: Radiation Oncology

## 2021-03-27 DIAGNOSIS — Z510 Encounter for antineoplastic radiation therapy: Secondary | ICD-10-CM | POA: Diagnosis not present

## 2021-03-27 DIAGNOSIS — Z87891 Personal history of nicotine dependence: Secondary | ICD-10-CM | POA: Diagnosis not present

## 2021-03-27 DIAGNOSIS — C3412 Malignant neoplasm of upper lobe, left bronchus or lung: Secondary | ICD-10-CM | POA: Diagnosis not present

## 2021-03-27 DIAGNOSIS — R918 Other nonspecific abnormal finding of lung field: Secondary | ICD-10-CM | POA: Diagnosis not present

## 2021-04-02 NOTE — Progress Notes (Signed)
? ?                                                                                                                                                          ?  Patient Name: [name] ?MRN: [id number] ?DOB: [birth year] ?Referring Physician: Leslye Peer M ?Date of Service: 03/27/2021 ?[facility] [place] ? ?                                                      End Of Treatment Note ? ?Diagnoses: C34.12-Malignant neoplasm of upper lobe, left bronchus or lung ? ?Cancer Staging: Putative Stage IIIA, cT1bNaM0, NSCLC of the LUL ? ?Intent: Palliative ? ?Radiation Treatment Dates: 03/09/2021 through 03/27/2021 ?Site Technique Total Dose (Gy) Dose per Fx (Gy) Completed Fx Beam Energies  ?Lung, Left: Lung_L 3D 37.5/37.5 2.5 15/15 6X  ? ?Narrative: The patient tolerated radiation therapy relatively well. He did have fatigue during therapy but his breathing improved, and did use carafate for esophagitis which helped his symptoms.  ? ?Plan: The patient will receive a call in about one month from the radiation oncology department and we will plan to follow him as needed moving forward. ?________________________________________________ ? ? ? ?Carola Rhine, PAC  ?

## 2021-04-21 DIAGNOSIS — M79676 Pain in unspecified toe(s): Secondary | ICD-10-CM | POA: Diagnosis not present

## 2021-04-21 DIAGNOSIS — I70203 Unspecified atherosclerosis of native arteries of extremities, bilateral legs: Secondary | ICD-10-CM | POA: Diagnosis not present

## 2021-04-21 DIAGNOSIS — L84 Corns and callosities: Secondary | ICD-10-CM | POA: Diagnosis not present

## 2021-04-21 DIAGNOSIS — B351 Tinea unguium: Secondary | ICD-10-CM | POA: Diagnosis not present

## 2021-04-27 ENCOUNTER — Ambulatory Visit
Admission: RE | Admit: 2021-04-27 | Discharge: 2021-04-27 | Disposition: A | Discharge status: Home / Self Care | Payer: Medicare Other | Source: Ambulatory Visit | Attending: Radiation Oncology | Admitting: Radiation Oncology

## 2021-04-27 DIAGNOSIS — R918 Other nonspecific abnormal finding of lung field: Secondary | ICD-10-CM | POA: Insufficient documentation

## 2021-05-04 NOTE — Progress Notes (Signed)
?  Radiation Oncology         [phone] ?________________________________ ? ?Name: [name] MRN: [id number]  ?Date of Service: 04/27/2021  DOB: [birth year] ? ?Post Treatment Telephone Note ? ?Diagnosis:   Putative Stage IIIA, cT1bNaM0, NSCLC of the LUL ? ?Intent: Palliative ? ?Radiation Treatment Dates: 03/09/2021 through 03/27/2021 ?Site Technique Total Dose (Gy) Dose per Fx (Gy) Completed Fx Beam Energies  ?Lung, Left: Lung_L 3D 37.5/37.5 2.5 15/15 6X  ? ?Narrative: The patient tolerated radiation therapy relatively well. He did have fatigue during therapy but his breathing improved, and did use carafate for esophagitis which helped his symptoms. His voice remains hoarse but he has been feeling much better in the last few weeks since completing treatment. ? ?Impression/Plan: ?1. Putative Stage IIIA, cT1bNaM0, NSCLC of the LUL. The patient has been doing well since completion of radiotherapy. We discussed that we would be happy to continue to follow him as needed, but he will also continue to follow up with Dr. Julien Nordmann in medical oncology.  ? ? ? ? ?Carola Rhine, PAC  ? ? ? ? ?

## 2021-05-05 ENCOUNTER — Telehealth: Payer: Self-pay | Admitting: Internal Medicine

## 2021-05-05 NOTE — Telephone Encounter (Signed)
.  Called patient to schedule appointment per 4/11 inbasket, patient is aware of date and time.   ?

## 2021-05-06 ENCOUNTER — Encounter: Payer: Self-pay | Admitting: Family Medicine

## 2021-05-06 ENCOUNTER — Ambulatory Visit (INDEPENDENT_AMBULATORY_CARE_PROVIDER_SITE_OTHER): Payer: Medicare Other | Admitting: Family Medicine

## 2021-05-06 VITALS — BP 137/75 | HR 90 | Temp 98.1°F | Ht 72.0 in | Wt 229.2 lb

## 2021-05-06 DIAGNOSIS — Z8546 Personal history of malignant neoplasm of prostate: Secondary | ICD-10-CM

## 2021-05-06 DIAGNOSIS — I700 Atherosclerosis of aorta: Secondary | ICD-10-CM

## 2021-05-06 DIAGNOSIS — M85852 Other specified disorders of bone density and structure, left thigh: Secondary | ICD-10-CM | POA: Diagnosis not present

## 2021-05-06 DIAGNOSIS — E039 Hypothyroidism, unspecified: Secondary | ICD-10-CM

## 2021-05-06 DIAGNOSIS — E782 Mixed hyperlipidemia: Secondary | ICD-10-CM

## 2021-05-06 DIAGNOSIS — I10 Essential (primary) hypertension: Secondary | ICD-10-CM | POA: Diagnosis not present

## 2021-05-06 MED ORDER — LISINOPRIL 40 MG PO TABS: 40.0000 mg | ORAL_TABLET | Freq: Every day | ORAL | 3 refills | Status: DC

## 2021-05-06 MED ORDER — ATORVASTATIN CALCIUM 40 MG PO TABS: 40.0000 mg | ORAL_TABLET | Freq: Every day | ORAL | 3 refills | Status: DC

## 2021-05-06 NOTE — Progress Notes (Signed)
? ?Subjective:  ?Patient ID: [name], male    DOB: Feb 10, 1932  Age: 88 y.o. MRN: [id number] ? ?CC: Medical Management of Chronic Issues ? ? ?HPI ?[name] presents for  follow-up on  thyroid. The patient has a history of hypothyroidism for many years. It has been stable recently. Pt. denies any change in  voice, loss of hair, heat or cold intolerance. Energy level has been adequate to good. Patient denies constipation and diarrhea. No myxedema. Medication is as noted below. Verified that pt is taking it daily on an empty stomach. Well tolerated. ? ? in for follow-up of elevated cholesterol. Doing well without complaints on current medication. Denies side effects of statin including myalgia and arthralgia and nausea. Currently no chest pain, shortness of breath or other cardiovascular related symptoms noted. ? ?Just finished radiation for Ca Lung LUL. Had apparent radiation damage to Left recurrent laryngeal nerve and has hoarseness. F/U with Cancer MD, Dr. Earlie Holmes planned for June 1.  ? ? ?  05/06/2021  ?  8:58 AM 11/05/2020  ?  2:12 PM 10/29/2020  ?  3:29 PM  ?Depression screen PHQ 2/9  ?Decreased Interest 1 0 1  ?Down, Depressed, Hopeless 0 0 0  ?PHQ - 2 Score 1 0 1  ?Altered sleeping 0  1  ?Tired, decreased energy 0  1  ?Change in appetite 0  0  ?Feeling bad or failure about yourself  0  0  ?Trouble concentrating 0  0  ?Moving slowly or fidgety/restless 0  0  ?Suicidal thoughts 0  0  ?PHQ-9 Score 1  3  ?Difficult doing work/chores Somewhat difficult  Somewhat difficult  ? ? ?History ?[name] has a past medical history of Aphasia, Arthritis, BNC (bladder neck contracture), BPPV (benign paroxysmal positional vertigo), Cataract, Diverticulosis, H/O diarrhea (SECONDARY TO RADIATION THERAPY --  INTERMITANT  DIARRHEA), Hematuria, History of hemorrhagic cystitis, History of prostate cancer (CURRENTLY  ELEVATED PSA--  LUPRON INJECTIONS), Hyperlipidemia, Hypertension, Hypothyroidism, Radiation cystitis,  Radiation proctitis, Urinary incontinence, Urinary retention, and Vitamin D deficiency.  ? ?He has a past surgical history that includes Prostatectomy (1997); Colonoscopy w/ polypectomy (2005; 08/11/2010); CYSTO/ FULGERATION OF BLEEDERS/ RESECTION BLADDER NECK CONTRACTURE (07-06-2004); Cataract extraction w/ intraocular lens implant (Right); Lumbar disc surgery (1996); Transurethral resection of bladder neck (N/A, 06/23/2012); Transurethral resection of bladder tumor with gyrus (turbt-gyrus) (N/A, 06/01/2013); Eye surgery; Spine surgery; and Knee arthroscopy (Right).  ? ?His family history includes Alzheimer's disease in his brother; Appendicitis (age of onset: 103) in his sister; CVA (age of onset: 71) in his mother; Cancer in his sister and sister; Congestive Heart Failure in his mother; Early death in his sister; Hip fracture in his brother; Kidney disease in his brother and sister; Obesity in his sister; Pancreatitis in his father; Pneumonia in his father; Prostate cancer in his brother.He reports that he quit smoking about 58 years ago. His smoking use included cigarettes. He quit smokeless tobacco use about 58 years ago.  His smokeless tobacco use included chew. He reports that he does not drink alcohol and does not use drugs. ? ? ? ?ROS ?Review of Systems  ?Constitutional: Negative.   ?HENT: Negative.    ?Eyes:  Negative for visual disturbance.  ?Respiratory:  Negative for cough and shortness of breath.   ?Cardiovascular:  Negative for chest pain and leg swelling.  ?Gastrointestinal:  Negative for abdominal pain, diarrhea, nausea and vomiting.  ?Genitourinary:  Negative for difficulty urinating.  ?Musculoskeletal:  Positive for gait problem (Unsteady,  using a cane today.  He does have a walker at home.). Negative for arthralgias.  ?Skin:  Negative for rash.  ?Neurological:  Negative for headaches.  ?Psychiatric/Behavioral:  Negative for sleep disturbance.   ? ?Objective:  ?BP 137/75   Pulse 90   Temp 98.1 ?F  (36.7 ?C)   Ht 6' (1.829 m)   Wt 229 lb 3.2 oz (104 kg)   SpO2 96%   BMI 31.09 kg/m?  ? ?BP Readings from Last 3 Encounters:  ?05/06/21 137/75  ?02/24/21 138/65  ?01/27/21 (!) 162/63  ? ? ?Wt Readings from Last 3 Encounters:  ?05/06/21 229 lb 3.2 oz (104 kg)  ?02/24/21 242 lb 6.4 oz (110 kg)  ?01/27/21 244 lb 3.2 oz (110.8 kg)  ? ? ? ?Physical Exam ?Vitals reviewed.  ?Constitutional:   ?   Appearance: He is well-developed. He is ill-appearing.  ?HENT:  ?   Head: Normocephalic and atraumatic.  ?   Right Ear: External ear normal.  ?   Left Ear: External ear normal.  ?   Mouth/Throat:  ?   Pharynx: No oropharyngeal exudate or posterior oropharyngeal erythema.  ?Eyes:  ?   Pupils: Pupils are equal, round, and reactive to light.  ?Cardiovascular:  ?   Rate and Rhythm: Normal rate and regular rhythm.  ?   Heart sounds: No murmur heard. ?Pulmonary:  ?   Effort: No respiratory distress.  ?   Breath sounds: Normal breath sounds.  ?Musculoskeletal:     ?   General: Deformity (Stooped posture) present.  ?   Cervical back: Normal range of motion and neck supple.  ?Neurological:  ?   Mental Status: He is alert and oriented to person, place, and time.  ? ? ? ? ?Assessment & Plan:  ? ?[name] was seen today for medical management of chronic issues. ? ?Diagnoses and all orders for this visit: ? ?Essential hypertension ?-     CBC with Differential/Platelet ?-     CMP14+EGFR ? ?Mixed hyperlipidemia ?-     Lipid panel ? ?Acquired hypothyroidism ?-     TSH + free T4 ? ?H/O prostate cancer ? ?Osteopenia of neck of left femur ? ?Aortic atherosclerosis ([place]) ? ?Other orders ?-     atorvastatin (LIPITOR) 40 MG tablet; Take 1 tablet (40 mg total) by mouth daily. ?-     lisinopril (ZESTRIL) 40 MG tablet; Take 1 tablet (40 mg total) by mouth daily. ? ? ? ? ? ? ?I am having [name] maintain his (FLAXSEED, LINSEED, PO), Leuprolide Acetate (6 Month), Vitamin D3, Magnesium, Probiotic Product (PROBIOTIC DAILY PO), aspirin, V-2 High  Compression Hose, GARLIC PO, Multiple Vitamins-Minerals (CENTRUM SILVER ULTRA MENS PO), amLODipine, nabumetone, triamcinolone cream, levothyroxine, methocarbamol, loratadine, acetaminophen, traMADol, sucralfate, atorvastatin, and lisinopril. ? ?Allergies as of 05/06/2021   ? ?   Reactions  ? Caffeine Palpitations  ? Codeine Palpitations  ? ?  ? ?  ?Medication List  ?  ? ?  ? Accurate as of May 06, 2021 11:28 AM. If you have any questions, ask your nurse or doctor.  ?  ?  ? ?  ? ?acetaminophen 325 MG tablet ?Commonly known as: TYLENOL ?Take 650 mg by mouth every 6 (six) hours as needed for mild pain. ?  ?amLODipine 10 MG tablet ?Commonly known as: NORVASC ?Take 1 tablet (10 mg total) by mouth daily. ?  ?aspirin 325 MG tablet ?Take 1 tablet (325 mg total) by mouth daily. ?  ?atorvastatin 40 MG  tablet ?Commonly known as: LIPITOR ?Take 1 tablet (40 mg total) by mouth daily. ?  ?CENTRUM SILVER ULTRA MENS PO ?Take 1 tablet by mouth daily. ?  ?FLAXSEED (LINSEED) PO ?Take 30 mLs by mouth every morning. GRINDS FLAX SEEDS AND PUTS IN 1/4 CUP OF WATER ?  ?GARLIC PO ?Take 2 capsules by mouth daily. ?  ?Leuprolide Acetate (6 Month) 45 MG injection ?Commonly known as: LUPRON ?Inject 45 mg into the muscle every 6 (six) months. ?  ?levothyroxine 88 MCG tablet ?Commonly known as: SYNTHROID ?TAKE 1 TABLET BY MOUTH ONCE DAILY BEFORE BREAKFAST ?  ?lisinopril 40 MG tablet ?Commonly known as: ZESTRIL ?Take 1 tablet (40 mg total) by mouth daily. ?  ?loratadine 10 MG tablet ?Commonly known as: CLARITIN ?Take 10 mg by mouth daily. ?  ?Magnesium 300 MG Caps ?Take 1 capsule by mouth daily. ?  ?methocarbamol 500 MG tablet ?Commonly known as: ROBAXIN ?Take 1 tablet (500 mg total) by mouth 2 (two) times daily as needed for muscle spasms. ?  ?nabumetone 500 MG tablet ?Commonly known as: Relafen ?Take 1 tablet (500 mg total) by mouth 2 (two) times daily. ?  ?PROBIOTIC DAILY PO ?Take 1 capsule by mouth daily. ?  ?sucralfate 1 g tablet ?Commonly  known as: Carafate ?Take 1 tablet (1 g total) by mouth 4 (four) times daily -  with meals and at bedtime. Crush 1 tablet in 1 oz water and drink 5 min before meals for radiation induced esophagitis ?  ?traMADol 50 MG tabl

## 2021-05-07 ENCOUNTER — Other Ambulatory Visit: Payer: Self-pay | Admitting: Physician Assistant

## 2021-05-07 DIAGNOSIS — R918 Other nonspecific abnormal finding of lung field: Secondary | ICD-10-CM

## 2021-05-07 LAB — LIPID PANEL
Chol/HDL Ratio: 2.3 ratio (ref 0.0–5.0)
Cholesterol, Total: 128 mg/dL (ref 100–199)
HDL: 55 mg/dL (ref >39)
LDL Chol Calc (NIH): 49 mg/dL (ref 0–99)
Triglycerides: 141 mg/dL (ref 0–149)
VLDL Cholesterol Cal: 24 mg/dL (ref 5–40)

## 2021-05-07 LAB — TSH+FREE T4
Free T4: 1.9 ng/dL — ABNORMAL HIGH (ref 0.82–1.77)
TSH: 2.71 u[IU]/mL (ref 0.450–4.500)

## 2021-05-07 LAB — CBC WITH DIFFERENTIAL/PLATELET
Basophils Absolute: 0 10*3/uL (ref 0.0–0.2)
Basos: 1 %
EOS (ABSOLUTE): 0.5 10*3/uL — ABNORMAL HIGH (ref 0.0–0.4)
Eos: 8 %
Hematocrit: 41.6 % (ref 37.5–51.0)
Hemoglobin: 14.1 g/dL (ref 13.0–17.7)
Immature Grans (Abs): 0 10*3/uL (ref 0.0–0.1)
Immature Granulocytes: 0 %
Lymphocytes Absolute: 0.9 10*3/uL (ref 0.7–3.1)
Lymphs: 15 %
MCH: 31.6 pg (ref 26.6–33.0)
MCHC: 33.9 g/dL (ref 31.5–35.7)
MCV: 93 fL (ref 79–97)
Monocytes Absolute: 0.7 10*3/uL (ref 0.1–0.9)
Monocytes: 12 %
Neutrophils Absolute: 3.7 10*3/uL (ref 1.4–7.0)
Neutrophils: 64 %
Platelets: 276 10*3/uL (ref 150–450)
RBC: 4.46 x10E6/uL (ref 4.14–5.80)
RDW: 13.1 % (ref 11.6–15.4)
WBC: 5.9 10*3/uL (ref 3.4–10.8)

## 2021-05-07 LAB — CMP14+EGFR
ALT: 15 IU/L (ref 0–44)
AST: 23 IU/L (ref 0–40)
Albumin/Globulin Ratio: 1.8 (ref 1.2–2.2)
Albumin: 4.2 g/dL (ref 3.6–4.6)
Alkaline Phosphatase: 93 IU/L (ref 44–121)
BUN/Creatinine Ratio: 17 (ref 10–24)
BUN: 15 mg/dL (ref 8–27)
Bilirubin Total: 0.6 mg/dL (ref 0.0–1.2)
CO2: 22 mmol/L (ref 20–29)
Calcium: 9.6 mg/dL (ref 8.6–10.2)
Chloride: 101 mmol/L (ref 96–106)
Creatinine, Ser: 0.87 mg/dL (ref 0.76–1.27)
Globulin, Total: 2.4 g/dL (ref 1.5–4.5)
Glucose: 106 mg/dL — ABNORMAL HIGH (ref 70–99)
Potassium: 5.4 mmol/L — ABNORMAL HIGH (ref 3.5–5.2)
Sodium: 135 mmol/L (ref 134–144)
Total Protein: 6.6 g/dL (ref 6.0–8.5)
eGFR: 84 mL/min/{1.73_m2} (ref >59)

## 2021-05-08 NOTE — Progress Notes (Signed)
Hello Rashawn, ? ?Your lab result is normal and/or stable.Some minor variations that are not significant are commonly marked abnormal, but do not represent any medical problem for you. ? ?Best regards, ?Claretta Fraise, M.D.

## 2021-06-10 ENCOUNTER — Other Ambulatory Visit: Payer: Medicare Other

## 2021-06-10 ENCOUNTER — Other Ambulatory Visit: Payer: Self-pay

## 2021-06-10 DIAGNOSIS — Z192 Hormone resistant malignancy status: Secondary | ICD-10-CM

## 2021-06-10 DIAGNOSIS — C61 Malignant neoplasm of prostate: Secondary | ICD-10-CM

## 2021-06-11 LAB — PSA: Prostate Specific Ag, Serum: 0.5 ng/mL (ref 0.0–4.0)

## 2021-06-11 LAB — TESTOSTERONE,FREE AND TOTAL
Testosterone, Free: 2 pg/mL — ABNORMAL LOW (ref 6.6–18.1)
Testosterone: 18 ng/dL — ABNORMAL LOW (ref 264–916)

## 2021-06-23 ENCOUNTER — Ambulatory Visit (HOSPITAL_COMMUNITY): Payer: Medicare Other

## 2021-06-24 ENCOUNTER — Encounter (HOSPITAL_BASED_OUTPATIENT_CLINIC_OR_DEPARTMENT_OTHER): Payer: Self-pay

## 2021-06-24 ENCOUNTER — Ambulatory Visit (HOSPITAL_BASED_OUTPATIENT_CLINIC_OR_DEPARTMENT_OTHER)
Admission: RE | Admit: 2021-06-24 | Discharge: 2021-06-24 | Disposition: A | Discharge status: Home / Self Care | Payer: Medicare Other | Source: Ambulatory Visit | Attending: Physician Assistant | Admitting: Physician Assistant

## 2021-06-24 DIAGNOSIS — R911 Solitary pulmonary nodule: Secondary | ICD-10-CM | POA: Diagnosis not present

## 2021-06-24 DIAGNOSIS — C3412 Malignant neoplasm of upper lobe, left bronchus or lung: Secondary | ICD-10-CM | POA: Diagnosis not present

## 2021-06-24 DIAGNOSIS — J9811 Atelectasis: Secondary | ICD-10-CM | POA: Diagnosis not present

## 2021-06-24 DIAGNOSIS — R918 Other nonspecific abnormal finding of lung field: Secondary | ICD-10-CM | POA: Diagnosis not present

## 2021-06-24 LAB — POCT I-STAT CREATININE: Creatinine, Ser: 0.9 mg/dL (ref 0.61–1.24)

## 2021-06-24 MED ORDER — IOHEXOL 300 MG/ML  SOLN
100.0000 mL | Freq: Once | INTRAMUSCULAR | Status: AC | PRN
  Administered 2021-06-24: 65 mL via INTRAVENOUS

## 2021-06-25 ENCOUNTER — Encounter: Payer: Self-pay | Admitting: Internal Medicine

## 2021-06-25 ENCOUNTER — Encounter: Payer: Self-pay | Admitting: *Deleted

## 2021-06-25 ENCOUNTER — Inpatient Hospital Stay: Payer: Medicare Other | Attending: Internal Medicine | Admitting: Internal Medicine

## 2021-06-25 ENCOUNTER — Other Ambulatory Visit: Payer: Self-pay

## 2021-06-25 VITALS — BP 139/59 | HR 78 | Temp 98.6°F | Resp 17 | Wt 228.1 lb

## 2021-06-25 DIAGNOSIS — C3490 Malignant neoplasm of unspecified part of unspecified bronchus or lung: Secondary | ICD-10-CM | POA: Insufficient documentation

## 2021-06-25 DIAGNOSIS — Z08 Encounter for follow-up examination after completed treatment for malignant neoplasm: Secondary | ICD-10-CM | POA: Insufficient documentation

## 2021-06-25 DIAGNOSIS — Z85118 Personal history of other malignant neoplasm of bronchus and lung: Secondary | ICD-10-CM | POA: Diagnosis not present

## 2021-06-25 DIAGNOSIS — R918 Other nonspecific abnormal finding of lung field: Secondary | ICD-10-CM | POA: Insufficient documentation

## 2021-06-25 NOTE — Progress Notes (Signed)
Oncology Nurse Navigator Documentation     06/25/2021    2:00 PM 01/07/2021   10:00 AM  Oncology Nurse Navigator Flowsheets  Abnormal Finding Date  01/05/2021  Diagnosis Status Treatment Based on Clinical Diagnosis   Planned Course of Treatment Radiation   Phase of Treatment Radiation   Radiation Actual Start Date: 03/09/2021   Radiation Actual End Date: 03/27/2021   Navigation Complete Date: 06/25/2021   Post Navigation: Continue to Follow Patient? No   Reason Not Navigating Patient: No Treatment, Observation Only   Navigator Location [facility]  Referral Date to RadOnc/MedOnc  01/07/2021  Navigator Encounter Type Clinic/MDC/spoke with patient and his daughter today at clinic.  He has completed XRT tx and is on observation one. I help to explain plan of care.  Other:  Treatment Initiated Date 03/09/2021   Patient Visit Type MedOnc;Post-XRT Other  Treatment Phase Follow-up   Barriers/Navigation Needs Education Coordination of Care  Education Other   Interventions Education;Psycho-Social Support Coordination of Care  Acuity Level 2-Minimal Needs (1-2 Barriers Identified) Level 2-Minimal Needs (1-2 Barriers Identified)  Coordination of Care  Other  Education Method Verbal   Time Spent with Patient 30 45

## 2021-06-25 NOTE — Progress Notes (Signed)
[place] [phone]   Fax:([phone]) [place], [place] [address]  DIAGNOSIS: Stage IIIA (T1c, N2, M0) lung cancer likely non-small cell lung cancer pending tissue diagnosis and presented with left upper lobe lung nodule in addition to AP window and subcarinal lymphadenopathy.  There was also indeterminate focus of mild hyper metabolic activity in the liver that need close monitoring.  The patient is high risk for anesthesia and he did not have a tissue diagnosis.  PRIOR THERAPY: Palliative radiotherapy to the left upper lobe lung nodule and mediastinal lymphadenopathy under the care of Dr. Lisbeth Renshaw completed March 27, 2021  CURRENT THERAPY: Observation.  INTERVAL HISTORY: [name] 88 y.o. male returns to the clinic today for follow-up visit accompanied by his daughter.  The patient is feeling fine today with no concerning complaints except for the baseline fatigue and shortness of breath with exertion.  He denied having any current chest pain, cough or hemoptysis.  He has no nausea, vomiting, diarrhea or constipation.  He has no headache or visual changes.  He has no recent weight loss or night sweats.  He tolerated his previous treatment with palliative radiotherapy fairly well.  MEDICAL HISTORY: Past Medical History:  Diagnosis Date   Aphasia    Arthritis    BNC (bladder neck contracture)    BPPV (benign paroxysmal positional vertigo)    Cataract    bilateral   Diverticulosis    H/O diarrhea SECONDARY TO RADIATION THERAPY --  INTERMITANT  DIARRHEA   Hematuria    History of hemorrhagic cystitis    SECONDARY RADIATION THERAPY 1997   History of prostate cancer CURRENTLY  ELEVATED PSA--  LUPRON INJECTIONS   S/P PROSTATECTOMY AND RADIATION THERAPY  1997   Hyperlipidemia    Hypertension    Hypothyroidism    Radiation cystitis    Radiation proctitis    Urinary incontinence    Urinary  retention    Vitamin D deficiency     ALLERGIES:  is allergic to caffeine and codeine.  MEDICATIONS:  Current Outpatient Medications  Medication Sig Dispense Refill   acetaminophen (TYLENOL) 325 MG tablet Take 650 mg by mouth every 6 (six) hours as needed for mild pain.     amLODipine (NORVASC) 10 MG tablet Take 1 tablet (10 mg total) by mouth daily. 90 tablet 3   aspirin 325 MG tablet Take 1 tablet (325 mg total) by mouth daily.     atorvastatin (LIPITOR) 40 MG tablet Take 1 tablet (40 mg total) by mouth daily. 90 tablet 3   Cholecalciferol (VITAMIN D3) 5000 UNITS CAPS Take 1 capsule by mouth daily.     Elastic Bandages & Supports (V-2 HIGH COMPRESSION HOSE) MISC Wear daily when up walking around 1 each 0   FLAXSEED, LINSEED, PO Take 30 mLs by mouth every morning. GRINDS FLAX SEEDS AND PUTS IN 1/4 CUP OF WATER     GARLIC PO Take 2 capsules by mouth daily.     Leuprolide Acetate, 6 Month, (LUPRON) 45 MG injection Inject 45 mg into the muscle every 6 (six) months.     levothyroxine (SYNTHROID) 88 MCG tablet TAKE 1 TABLET BY MOUTH ONCE DAILY BEFORE BREAKFAST 90 tablet 2   lisinopril (ZESTRIL) 40 MG tablet Take 1 tablet (40 mg total) by mouth daily. 90 tablet 3   loratadine (CLARITIN) 10 MG tablet Take 10 mg by mouth daily.  Magnesium 300 MG CAPS Take 1 capsule by mouth daily.     methocarbamol (ROBAXIN) 500 MG tablet Take 1 tablet (500 mg total) by mouth 2 (two) times daily as needed for muscle spasms. 10 tablet 0   Multiple Vitamins-Minerals (CENTRUM SILVER ULTRA MENS PO) Take 1 tablet by mouth daily.     nabumetone (RELAFEN) 500 MG tablet Take 1 tablet (500 mg total) by mouth 2 (two) times daily. 60 tablet 2   Probiotic Product (PROBIOTIC DAILY PO) Take 1 capsule by mouth daily.     sucralfate (CARAFATE) 1 g tablet Take 1 tablet (1 g total) by mouth 4 (four) times daily -  with meals and at bedtime. Crush 1 tablet in 1 oz water and drink 5 min before meals for radiation induced  esophagitis 120 tablet 0   traMADol (ULTRAM) 50 MG tablet Take 1 tablet (50 mg total) by mouth every 6 (six) hours as needed. 60 tablet 0   triamcinolone cream (KENALOG) 0.1 % Apply topically 2 (two) times daily.     No current facility-administered medications for this visit.    SURGICAL HISTORY:  Past Surgical History:  Procedure Laterality Date   CATARACT EXTRACTION W/ INTRAOCULAR LENS IMPLANT Right    COLONOSCOPY W/ POLYPECTOMY  2005; 08/11/2010   2005: 1 cm hyperplastic sigmoid polyp, Dr. Wynetta Emery 2012: hyperplastic splenic flexure polyp -1 cm, diverticulosis, radiation proctitis, anal stenosis   CYSTO/ FULGERATION OF BLEEDERS/ RESECTION BLADDER NECK CONTRACTURE  07-06-2004   BNC AND RADIATION CYSTITIS   EYE SURGERY     KNEE ARTHROSCOPY Right    LUMBAR DISC SURGERY  1996   L4 -- L5   PROSTATECTOMY  1997   SPINE SURGERY     TRANSURETHRAL RESECTION OF BLADDER NECK N/A 06/23/2012   Procedure: TRANSURETHRAL RESECTION OF BLADDER NECK CONTRACTURE WITH GYRUS;  Surgeon: Claybon Jabs, MD;  Location: [facility];  Service: Urology;  Laterality: N/A;   TRANSURETHRAL RESECTION OF BLADDER TUMOR WITH GYRUS (TURBT-GYRUS) N/A 06/01/2013   Procedure: TRANSURETHRAL RESECTION OF BLADDER NECK CONTRACTURE WITH GYRUS (TURBT-GYRUS) AND INJECTION OF KENALOG;  Surgeon: Claybon Jabs, MD;  Location: [facility];  Service: Urology;  Laterality: N/A;    REVIEW OF SYSTEMS:  A comprehensive review of systems was negative except for: Constitutional: positive for fatigue Respiratory: positive for dyspnea on exertion Musculoskeletal: positive for arthralgias and muscle weakness   PHYSICAL EXAMINATION: General appearance: alert, cooperative, fatigued, and no distress Head: Normocephalic, without obvious abnormality, atraumatic Neck: no adenopathy, no JVD, supple, symmetrical, trachea midline, and thyroid not enlarged, symmetric, no tenderness/mass/nodules Lymph nodes: Cervical,  supraclavicular, and axillary nodes normal. Resp: clear to auscultation bilaterally Back: symmetric, no curvature. ROM normal. No CVA tenderness. Cardio: regular rate and rhythm, S1, S2 normal, no murmur, click, rub or gallop GI: soft, non-tender; bowel sounds normal; no masses,  no organomegaly Extremities: extremities normal, atraumatic, no cyanosis or edema  ECOG PERFORMANCE STATUS: 1 - Symptomatic but completely ambulatory  Blood pressure (!) 139/59, pulse 78, temperature 98.6 F (37 C), temperature source Tympanic, resp. rate 17, weight 228 lb 1 oz (103.4 kg), SpO2 96 %.  LABORATORY DATA: Lab Results  Component Value Date   WBC 5.9 05/06/2021   HGB 14.1 05/06/2021   HCT 41.6 05/06/2021   MCV 93 05/06/2021   PLT 276 05/06/2021      Chemistry      Component Value Date/Time   NA 135 05/06/2021 0927   K 5.4 (H) 05/06/2021 9528  CL 101 05/06/2021 0927   CO2 22 05/06/2021 0927   BUN 15 05/06/2021 0927   CREATININE 0.90 06/24/2021 1258   CREATININE 0.86 01/27/2021 1358   CREATININE 0.98 07/04/2012 1518      Component Value Date/Time   CALCIUM 9.6 05/06/2021 0927   ALKPHOS 93 05/06/2021 0927   AST 23 05/06/2021 0927   AST 27 01/27/2021 1358   ALT 15 05/06/2021 0927   ALT 34 01/27/2021 1358   BILITOT 0.6 05/06/2021 0927   BILITOT 0.4 01/27/2021 1358       RADIOGRAPHIC STUDIES: CT Chest W Contrast  Result Date: 06/25/2021 CLINICAL DATA:  Left upper lobe lung cancer restaging * Tracking Code: BO * EXAM: CT CHEST WITH CONTRAST TECHNIQUE: Multidetector CT imaging of the chest was performed during intravenous contrast administration. RADIATION DOSE REDUCTION: This exam was performed according to the departmental dose-optimization program which includes automated exposure control, adjustment of the mA and/or kV according to patient size and/or use of iterative reconstruction technique. CONTRAST:  76mL OMNIPAQUE IOHEXOL 300 MG/ML  SOLN COMPARISON:  PET-CT, 01/23/2021, CT chest,  01/02/2021 FINDINGS: Cardiovascular: Aortic atherosclerosis. Normal heart size. Three-vessel coronary artery calcifications. No pericardial effusion. Mediastinum/Nodes: Diminished size of a lymph node conglomerate in the AP window measuring 2.4 x 2.1 cm, previously 3.4 x 2.5 cm (series 2, image 59). Unchanged subcarinal lymph node measuring 1.8 x 1.3 cm (series 2, image 73). Small hiatal hernia. Thyroid gland, trachea, and esophagus demonstrate no significant findings. Lungs/Pleura: Interval decrease in size of a subpleural nodule of the paramedian left upper lobe, measuring 2.0 x 1.1 cm, previously 2.9 x 2.3 cm (series 4, image 56). There is new, adjacent bandlike scarring and fibrosis (series 4, image 60). Minimal dependent bibasilar scarring or atelectasis. No pleural effusion or pneumothorax. Upper Abdomen: No acute abnormality. Musculoskeletal: No chest wall abnormality. No suspicious osseous lesions identified. Disc degenerative disease and ankylosis of the thoracic spine. IMPRESSION: 1. Interval decrease in size of a subpleural nodule of the paramedian left upper lobe, with new adjacent bandlike scarring and fibrosis. Findings are consistent with treatment response and developing radiation fibrosis. 2. Diminished size of a lymph node conglomerate in the AP window with unchanged subcarinal lymph node. Findings consistent with treatment response of nodal metastatic disease. 3. Coronary artery disease. Aortic Atherosclerosis (ICD10-I70.0). Electronically Signed   By: Delanna Ahmadi M.D.   On: 06/25/2021 11:07    ASSESSMENT AND PLAN: This is a very pleasant 88 years old white male with Stage IIIA (T1c, N2, M0) lung cancer likely non-small cell lung cancer pending tissue diagnosis and presented with left upper lobe lung nodule in addition to AP window and subcarinal lymphadenopathy.  There was also indeterminate focus of mild hyper metabolic activity in the liver that need close monitoring.  The patient is high  risk for anesthesia and he did not have a tissue diagnosis. The patient is status post palliative radiotherapy to the left upper lobe lung nodule in addition to the mediastinal lymphadenopathy under the care of Dr. Genia Harold. He is currently on observation and feeling fine. He had repeat CT scan of the chest performed recently.  I personally and independently reviewed the scan and discussed the result with the patient and his daughter. His scan showed interval decrease in the size of the subpleural nodule and diminished size of the lymph node conglomerate in the AP window with unchanged subcarinal lymph node. I discussed the scan results with the patient and his daughter and recommended for him to  continue on observation with repeat CT scan of the chest in 3 months. The patient was advised to call immediately if he has any other concerning symptoms in the interval. The patient voices understanding of current disease status and treatment options and is in agreement with the current care plan.  All questions were answered. The patient knows to call the clinic with any problems, questions or concerns. We can certainly see the patient much sooner if necessary.  The total time spent in the appointment was 25 minutes.  Disclaimer: This note was dictated with voice recognition software. Similar sounding words can inadvertently be transcribed and may not be corrected upon review.

## 2021-07-21 DIAGNOSIS — L84 Corns and callosities: Secondary | ICD-10-CM | POA: Diagnosis not present

## 2021-07-21 DIAGNOSIS — M79676 Pain in unspecified toe(s): Secondary | ICD-10-CM | POA: Diagnosis not present

## 2021-07-21 DIAGNOSIS — I70209 Unspecified atherosclerosis of native arteries of extremities, unspecified extremity: Secondary | ICD-10-CM | POA: Diagnosis not present

## 2021-07-21 DIAGNOSIS — B351 Tinea unguium: Secondary | ICD-10-CM | POA: Diagnosis not present

## 2021-08-19 ENCOUNTER — Ambulatory Visit (INDEPENDENT_AMBULATORY_CARE_PROVIDER_SITE_OTHER): Payer: Medicare Other | Admitting: Family Medicine

## 2021-08-19 ENCOUNTER — Encounter: Payer: Self-pay | Admitting: Family Medicine

## 2021-08-19 ENCOUNTER — Ambulatory Visit (INDEPENDENT_AMBULATORY_CARE_PROVIDER_SITE_OTHER): Payer: Medicare Other

## 2021-08-19 VITALS — BP 153/76 | HR 76 | Temp 97.0°F | Ht 72.0 in | Wt 224.8 lb

## 2021-08-19 DIAGNOSIS — M545 Low back pain, unspecified: Secondary | ICD-10-CM

## 2021-08-19 DIAGNOSIS — G8929 Other chronic pain: Secondary | ICD-10-CM

## 2021-08-19 DIAGNOSIS — E039 Hypothyroidism, unspecified: Secondary | ICD-10-CM

## 2021-08-19 DIAGNOSIS — I10 Essential (primary) hypertension: Secondary | ICD-10-CM | POA: Diagnosis not present

## 2021-08-19 DIAGNOSIS — M533 Sacrococcygeal disorders, not elsewhere classified: Secondary | ICD-10-CM | POA: Diagnosis not present

## 2021-08-19 DIAGNOSIS — E782 Mixed hyperlipidemia: Secondary | ICD-10-CM

## 2021-08-19 MED ORDER — LEVOTHYROXINE SODIUM 88 MCG PO TABS
88.0000 ug | ORAL_TABLET | Freq: Every day | ORAL | 2 refills | Status: DC
Start: 2021-08-19 — End: 2022-02-22

## 2021-08-19 MED ORDER — ACETAMINOPHEN 500 MG PO TABS: 1000.0000 mg | ORAL_TABLET | Freq: Three times a day (TID) | ORAL | 99 refills | Status: DC

## 2021-08-19 MED ORDER — AMLODIPINE BESYLATE 10 MG PO TABS: 10.0000 mg | ORAL_TABLET | Freq: Every day | ORAL | 3 refills | Status: DC

## 2021-08-19 NOTE — Progress Notes (Signed)
Subjective:  Patient ID: [name],  male    DOB: 04-21-1932  Age: 88 y.o.    CC: Medical Management of Chronic Issues   HPI [name] presents for  follow-up of hypertension. Patient has no history of headache chest pain or shortness of breath or recent cough. Patient also denies symptoms of TIA such as numbness weakness lateralizing. Patient denies side effects from medication. States taking it regularly. BP at home running 126-129/ 60s  Patient also  in for follow-up of elevated cholesterol. Doing well without complaints on current medication. Denies side effects  including myalgia and arthralgia and nausea. Also in today for liver function testing. Currently no chest pain, shortness of breath or other cardiovascular related symptoms noted.   follow-up on  thyroid. The patient has a history of hypothyroidism for many years. It has been stable recently. Pt. denies any change in  voice, loss of hair, heat or cold intolerance. Energy level has been adequate to good. Patient denies constipation and diarrhea. No myxedema. Medication is as noted below. Verified that pt is taking it daily on an empty stomach. Well tolerated. ` Radiation for lung ca, involving Left recurrent laryngeal nerve has paralysis.    History [name] has a past medical history of Aphasia, Arthritis, BNC (bladder neck contracture), BPPV (benign paroxysmal positional vertigo), Cataract, Diverticulosis, H/O diarrhea (SECONDARY TO RADIATION THERAPY --  INTERMITANT  DIARRHEA), Hematuria, History of hemorrhagic cystitis, History of prostate cancer (CURRENTLY  ELEVATED PSA--  LUPRON INJECTIONS), Hyperlipidemia, Hypertension, Hypothyroidism, Radiation cystitis, Radiation proctitis, Urinary incontinence, Urinary retention, and Vitamin D deficiency.   He has a past surgical history that includes Prostatectomy (1997); Colonoscopy w/ polypectomy (2005; 08/11/2010); CYSTO/ FULGERATION OF BLEEDERS/ RESECTION BLADDER NECK  CONTRACTURE (07-06-2004); Cataract extraction w/ intraocular lens implant (Right); Lumbar disc surgery (1996); Transurethral resection of bladder neck (N/A, 06/23/2012); Transurethral resection of bladder tumor with gyrus (turbt-gyrus) (N/A, 06/01/2013); Eye surgery; Spine surgery; and Knee arthroscopy (Right).   His family history includes Alzheimer's disease in his brother; Appendicitis (age of onset: 49) in his sister; CVA (age of onset: 32) in his mother; Cancer in his sister and sister; Congestive Heart Failure in his mother; Early death in his sister; Hip fracture in his brother; Kidney disease in his brother and sister; Obesity in his sister; Pancreatitis in his father; Pneumonia in his father; Prostate cancer in his brother.He reports that he quit smoking about 58 years ago. His smoking use included cigarettes. He quit smokeless tobacco use about 58 years ago.  His smokeless tobacco use included chew. He reports that he does not drink alcohol and does not use drugs.  Current Outpatient Medications on File Prior to Visit  Medication Sig Dispense Refill   aspirin 325 MG tablet Take 1 tablet (325 mg total) by mouth daily.     atorvastatin (LIPITOR) 40 MG tablet Take 1 tablet (40 mg total) by mouth daily. 90 tablet 3   Cholecalciferol (VITAMIN D3) 5000 UNITS CAPS Take 1 capsule by mouth daily.     Elastic Bandages & Supports (V-2 HIGH COMPRESSION HOSE) MISC Wear daily when up walking around 1 each 0   enzalutamide (XTANDI) 40 MG tablet Take 160 mg by mouth daily.     FLAXSEED, LINSEED, PO Take 30 mLs by mouth every morning. GRINDS FLAX SEEDS AND PUTS IN 1/4 CUP OF WATER     GARLIC PO Take 2 capsules by mouth daily.     Leuprolide Acetate, 6 Month, (LUPRON) 45 MG injection  Inject 45 mg into the muscle every 6 (six) months.     lisinopril (ZESTRIL) 40 MG tablet Take 1 tablet (40 mg total) by mouth daily. 90 tablet 3   loratadine (CLARITIN) 10 MG tablet Take 10 mg by mouth daily.     Magnesium 300 MG  CAPS Take 1 capsule by mouth daily.     methocarbamol (ROBAXIN) 500 MG tablet Take 1 tablet (500 mg total) by mouth 2 (two) times daily as needed for muscle spasms. 10 tablet 0   Multiple Vitamins-Minerals (CENTRUM SILVER ULTRA MENS PO) Take 1 tablet by mouth daily.     nabumetone (RELAFEN) 500 MG tablet Take 1 tablet (500 mg total) by mouth 2 (two) times daily. 60 tablet 2   Probiotic Product (PROBIOTIC DAILY PO) Take 1 capsule by mouth daily.     sucralfate (CARAFATE) 1 g tablet Take 1 tablet (1 g total) by mouth 4 (four) times daily -  with meals and at bedtime. Crush 1 tablet in 1 oz water and drink 5 min before meals for radiation induced esophagitis 120 tablet 0   traMADol (ULTRAM) 50 MG tablet Take 1 tablet (50 mg total) by mouth every 6 (six) hours as needed. 60 tablet 0   triamcinolone cream (KENALOG) 0.1 % Apply topically 2 (two) times daily.     No current facility-administered medications on file prior to visit.    ROS Review of Systems  Objective:  BP (!) 153/76   Pulse 76   Temp (!) 97 F (36.1 C)   Ht 6' (1.829 m)   Wt 224 lb 12.8 oz (102 kg)   SpO2 96%   BMI 30.49 kg/m   BP Readings from Last 3 Encounters:  08/19/21 (!) 153/76  06/25/21 (!) 139/59  05/06/21 137/75    Wt Readings from Last 3 Encounters:  08/19/21 224 lb 12.8 oz (102 kg)  06/25/21 228 lb 1 oz (103.4 kg)  05/06/21 229 lb 3.2 oz (104 kg)     Physical Exam  Diabetic Foot Exam - Simple   No data filed     Lab Results  Component Value Date   HGBA1C 5.5 04/05/2018   HGBA1C 5.5 04/04/2016    Assessment & Plan:   [name] was seen today for medical management of chronic issues.  Diagnoses and all orders for this visit:  Essential hypertension -     CBC with Differential/Platelet -     CMP14+EGFR -     amLODipine (NORVASC) 10 MG tablet; Take 1 tablet (10 mg total) by mouth daily.  Mixed hyperlipidemia -     Lipid panel  Acquired hypothyroidism -     TSH + free T4  Chronic  left-sided low back pain without sciatica -     DG Sacrum/Coccyx; Future  Other orders -     levothyroxine (SYNTHROID) 88 MCG tablet; Take 1 tablet (88 mcg total) by mouth daily before breakfast. -     acetaminophen (TYLENOL) 500 MG tablet; Take 2 tablets (1,000 mg total) by mouth 3 (three) times daily.   I have changed [name]. [name]'s levothyroxine and acetaminophen. I am also having him maintain his (FLAXSEED, LINSEED, PO), Leuprolide Acetate (6 Month), Vitamin D3, Magnesium, Probiotic Product (PROBIOTIC DAILY PO), aspirin, V-2 High Compression Hose, GARLIC PO, Multiple Vitamins-Minerals (CENTRUM SILVER ULTRA MENS PO), nabumetone, triamcinolone cream, methocarbamol, loratadine, traMADol, sucralfate, atorvastatin, lisinopril, amLODipine, and enzalutamide.  Meds ordered this encounter  Medications   amLODipine (NORVASC) 10 MG tablet    Sig:  Take 1 tablet (10 mg total) by mouth daily.    Dispense:  90 tablet    Refill:  3   levothyroxine (SYNTHROID) 88 MCG tablet    Sig: Take 1 tablet (88 mcg total) by mouth daily before breakfast.    Dispense:  90 tablet    Refill:  2   acetaminophen (TYLENOL) 500 MG tablet    Sig: Take 2 tablets (1,000 mg total) by mouth 3 (three) times daily.    Dispense:  100 tablet    Refill:  PRN     Follow-up: Return in about 6 months (around 02/19/2022).  Claretta Fraise, M.D.

## 2021-08-19 NOTE — Patient Instructions (Signed)
Back Exercises The following exercises strengthen the muscles that help to support the trunk (torso) and back. They also help to keep the lower back flexible. Doing these exercises can help to prevent or lessen existing low back pain. If you have back pain or discomfort, try doing these exercises 2-3 times each day or as told by your health care provider. As your pain improves, do them once each day, but increase the number of times that you repeat the steps for each exercise (do more repetitions). To prevent the recurrence of back pain, continue to do these exercises once each day or as told by your health care provider. Do exercises exactly as told by your health care provider and adjust them as directed. It is normal to feel mild stretching, pulling, tightness, or discomfort as you do these exercises, but you should stop right away if you feel sudden pain or your pain gets worse. Exercises Single knee to chest Repeat these steps 3-5 times for each leg: Lie on your back on a firm bed or the floor with your legs extended. Bring one knee to your chest. Your other leg should stay extended and in contact with the floor. Hold your knee in place by grabbing your knee or thigh with both hands and hold. Pull on your knee until you feel a gentle stretch in your lower back or buttocks. Hold the stretch for 10-30 seconds. Slowly release and straighten your leg.  Pelvic tilt Repeat these steps 5-10 times: Lie on your back on a firm bed or the floor with your legs extended. Bend your knees so they are pointing toward the ceiling and your feet are flat on the floor. Tighten your lower abdominal muscles to press your lower back against the floor. This motion will tilt your pelvis so your tailbone points up toward the ceiling instead of pointing to your feet or the floor. With gentle tension and even breathing, hold this position for 5-10 seconds.  Cat-cow Repeat these steps until your lower back becomes  more flexible: Get into a hands-and-knees position on a firm bed or the floor. Keep your hands under your shoulders, and keep your knees under your hips. You may place padding under your knees for comfort. Let your head hang down toward your chest. Contract your abdominal muscles and point your tailbone toward the floor so your lower back becomes rounded like the back of a cat. Hold this position for 5 seconds. Slowly lift your head, let your abdominal muscles relax, and point your tailbone up toward the ceiling so your back forms a sagging arch like the back of a cow. Hold this position for 5 seconds.   Bridges Repeat these steps 10 times: Lie on your back on a firm bed or the floor. Bend your knees so they are pointing toward the ceiling and your feet are flat on the floor. Your arms should be flat at your sides, next to your body. Tighten your buttocks muscles and lift your buttocks off the floor until your waist is at almost the same height as your knees. You should feel the muscles working in your buttocks and the back of your thighs. If you do not feel these muscles, slide your feet 1-2 inches (2.5-5 cm) farther away from your buttocks. Hold this position for 3-5 seconds. Slowly lower your hips to the starting position, and allow your buttocks muscles to relax completely. If this exercise is too easy, try doing it with your arms crossed over your  chest.  Contact a health care provider if: Your back pain or discomfort gets much worse when you do an exercise. Your worsening back pain or discomfort does not lessen within 2 hours after you exercise. If you have any of these problems, stop doing these exercises right away. Do not do them again unless your health care provider says that you can. Get help right away if: You develop sudden, severe back pain. If this happens, stop doing the exercises right away. Do not do them again unless your health care provider says that you can. This  information is not intended to replace advice given to you by your health care provider. Make sure you discuss any questions you have with your health care provider. Document Revised: 07/08/2020 Document Reviewed: 03/26/2020 Elsevier Patient Education  [employer].

## 2021-08-20 LAB — CMP14+EGFR
ALT: 15 IU/L (ref 0–44)
AST: 20 IU/L (ref 0–40)
Albumin/Globulin Ratio: 1.8 (ref 1.2–2.2)
Albumin: 4.3 g/dL (ref 3.7–4.7)
Alkaline Phosphatase: 78 IU/L (ref 44–121)
BUN/Creatinine Ratio: 16 (ref 10–24)
BUN: 14 mg/dL (ref 8–27)
Bilirubin Total: 0.5 mg/dL (ref 0.0–1.2)
CO2: 20 mmol/L (ref 20–29)
Calcium: 10 mg/dL (ref 8.6–10.2)
Chloride: 99 mmol/L (ref 96–106)
Creatinine, Ser: 0.89 mg/dL (ref 0.76–1.27)
Globulin, Total: 2.4 g/dL (ref 1.5–4.5)
Glucose: 105 mg/dL — ABNORMAL HIGH (ref 70–99)
Potassium: 5 mmol/L (ref 3.5–5.2)
Sodium: 134 mmol/L (ref 134–144)
Total Protein: 6.7 g/dL (ref 6.0–8.5)
eGFR: 83 mL/min/{1.73_m2} (ref >59)

## 2021-08-20 LAB — CBC WITH DIFFERENTIAL/PLATELET
Basophils Absolute: 0 10*3/uL (ref 0.0–0.2)
Basos: 1 %
EOS (ABSOLUTE): 0.2 10*3/uL (ref 0.0–0.4)
Eos: 5 %
Hematocrit: 41.2 % (ref 37.5–51.0)
Hemoglobin: 14.3 g/dL (ref 13.0–17.7)
Immature Grans (Abs): 0 10*3/uL (ref 0.0–0.1)
Immature Granulocytes: 0 %
Lymphocytes Absolute: 1.2 10*3/uL (ref 0.7–3.1)
Lymphs: 22 %
MCH: 31.8 pg (ref 26.6–33.0)
MCHC: 34.7 g/dL (ref 31.5–35.7)
MCV: 92 fL (ref 79–97)
Monocytes Absolute: 0.6 10*3/uL (ref 0.1–0.9)
Monocytes: 10 %
Neutrophils Absolute: 3.3 10*3/uL (ref 1.4–7.0)
Neutrophils: 62 %
Platelets: 267 10*3/uL (ref 150–450)
RBC: 4.49 x10E6/uL (ref 4.14–5.80)
RDW: 13.4 % (ref 11.6–15.4)
WBC: 5.3 10*3/uL (ref 3.4–10.8)

## 2021-08-20 LAB — TSH+FREE T4
Free T4: 1.68 ng/dL (ref 0.82–1.77)
TSH: 3.26 u[IU]/mL (ref 0.450–4.500)

## 2021-08-20 LAB — LIPID PANEL
Chol/HDL Ratio: 2.3 ratio (ref 0.0–5.0)
Cholesterol, Total: 134 mg/dL (ref 100–199)
HDL: 59 mg/dL (ref >39)
LDL Chol Calc (NIH): 53 mg/dL (ref 0–99)
Triglycerides: 127 mg/dL (ref 0–149)
VLDL Cholesterol Cal: 22 mg/dL (ref 5–40)

## 2021-08-27 ENCOUNTER — Telehealth: Payer: Self-pay | Admitting: Internal Medicine

## 2021-08-27 NOTE — Telephone Encounter (Signed)
Per 8/3 phone line pt called to r/s appointment  appointment r/s per pt request

## 2021-09-02 ENCOUNTER — Other Ambulatory Visit: Payer: Self-pay | Admitting: Family Medicine

## 2021-09-02 ENCOUNTER — Other Ambulatory Visit: Payer: Medicare Other

## 2021-09-02 ENCOUNTER — Other Ambulatory Visit: Payer: Self-pay | Admitting: *Deleted

## 2021-09-02 ENCOUNTER — Other Ambulatory Visit: Payer: Self-pay | Admitting: Urology

## 2021-09-02 DIAGNOSIS — R9721 Rising PSA following treatment for malignant neoplasm of prostate: Secondary | ICD-10-CM

## 2021-09-09 DIAGNOSIS — C61 Malignant neoplasm of prostate: Secondary | ICD-10-CM | POA: Diagnosis not present

## 2021-09-09 DIAGNOSIS — R351 Nocturia: Secondary | ICD-10-CM | POA: Diagnosis not present

## 2021-09-11 ENCOUNTER — Telehealth: Payer: Self-pay

## 2021-09-11 ENCOUNTER — Other Ambulatory Visit: Payer: Self-pay | Admitting: Radiation Oncology

## 2021-09-11 NOTE — Telephone Encounter (Signed)
The following message was received from RAD  "Good morning, [name] is calling asking for a refill on his tramadol. [name] was the last one to fill this however the patient has completed his radiation therapy (March 2023). He would like to have the tramadol on hand when his pain flairs up".

## 2021-09-12 DIAGNOSIS — C3490 Malignant neoplasm of unspecified part of unspecified bronchus or lung: Secondary | ICD-10-CM | POA: Diagnosis not present

## 2021-09-12 DIAGNOSIS — M8580 Other specified disorders of bone density and structure, unspecified site: Secondary | ICD-10-CM | POA: Diagnosis not present

## 2021-09-12 DIAGNOSIS — I700 Atherosclerosis of aorta: Secondary | ICD-10-CM | POA: Diagnosis not present

## 2021-09-12 DIAGNOSIS — M545 Low back pain, unspecified: Secondary | ICD-10-CM | POA: Diagnosis not present

## 2021-09-12 DIAGNOSIS — M4856XA Collapsed vertebra, not elsewhere classified, lumbar region, initial encounter for fracture: Secondary | ICD-10-CM | POA: Diagnosis not present

## 2021-09-12 DIAGNOSIS — N3001 Acute cystitis with hematuria: Secondary | ICD-10-CM | POA: Diagnosis not present

## 2021-09-12 DIAGNOSIS — E785 Hyperlipidemia, unspecified: Secondary | ICD-10-CM | POA: Diagnosis not present

## 2021-09-12 DIAGNOSIS — I10 Essential (primary) hypertension: Secondary | ICD-10-CM | POA: Diagnosis not present

## 2021-09-12 DIAGNOSIS — M8588 Other specified disorders of bone density and structure, other site: Secondary | ICD-10-CM | POA: Diagnosis not present

## 2021-09-12 DIAGNOSIS — M2578 Osteophyte, vertebrae: Secondary | ICD-10-CM | POA: Diagnosis not present

## 2021-09-14 NOTE — Telephone Encounter (Signed)
I have spoken with the pt and advised as indicated. Pt expressed understanding of this information.

## 2021-09-17 ENCOUNTER — Encounter: Payer: Self-pay | Admitting: Family Medicine

## 2021-09-17 ENCOUNTER — Ambulatory Visit (INDEPENDENT_AMBULATORY_CARE_PROVIDER_SITE_OTHER): Payer: Medicare Other | Admitting: Family Medicine

## 2021-09-17 VITALS — BP 146/78 | HR 97 | Temp 97.1°F | Ht 72.0 in | Wt 223.6 lb

## 2021-09-17 DIAGNOSIS — M545 Low back pain, unspecified: Secondary | ICD-10-CM

## 2021-09-17 DIAGNOSIS — S32040A Wedge compression fracture of fourth lumbar vertebra, initial encounter for closed fracture: Secondary | ICD-10-CM

## 2021-09-17 DIAGNOSIS — G8929 Other chronic pain: Secondary | ICD-10-CM | POA: Diagnosis not present

## 2021-09-17 MED ORDER — HYDROCODONE-ACETAMINOPHEN 5-325 MG PO TABS: 1.0000 | ORAL_TABLET | Freq: Four times a day (QID) | ORAL | 0 refills | Status: DC | PRN

## 2021-09-17 NOTE — Progress Notes (Signed)
Subjective:  Patient ID: [name], male    DOB: 12-27-1932  Age: 88 y.o. MRN: [id number]  CC: Back Pain (Left lower/)   HPI MACALLAN ORD presents for left lower back pain. Has chronic LBP, but had sudden exacerbation without injury noted on 09/12/21. Pain is like a knife. No radiation. Went to ED at [facility]. Had CT lumbar and Tx for UTI.   Pain is just left of midline at the base of the spine. CT reviewed. Shows L4 compression fx with 60% height loss. Also sclerosis that could be ffrom his cancer. (Not known to have mets at that area.)     09/17/2021    8:58 AM 08/19/2021    8:20 AM 05/06/2021    8:58 AM  Depression screen PHQ 2/9  Decreased Interest 0 0 1  Down, Depressed, Hopeless 0 0 0  PHQ - 2 Score 0 0 1  Altered sleeping   0  Tired, decreased energy   0  Change in appetite   0  Feeling bad or failure about yourself    0  Trouble concentrating   0  Moving slowly or fidgety/restless   0  Suicidal thoughts   0  PHQ-9 Score   1  Difficult doing work/chores   Somewhat difficult    History Ancelmo has a past medical history of Aphasia, Arthritis, BNC (bladder neck contracture), BPPV (benign paroxysmal positional vertigo), Cataract, Diverticulosis, H/O diarrhea (SECONDARY TO RADIATION THERAPY --  INTERMITANT  DIARRHEA), Hematuria, History of hemorrhagic cystitis, History of prostate cancer (CURRENTLY  ELEVATED PSA--  LUPRON INJECTIONS), Hyperlipidemia, Hypertension, Hypothyroidism, Radiation cystitis, Radiation proctitis, Urinary incontinence, Urinary retention, and Vitamin D deficiency.   He has a past surgical history that includes Prostatectomy (1997); Colonoscopy w/ polypectomy (2005; 08/11/2010); CYSTO/ FULGERATION OF BLEEDERS/ RESECTION BLADDER NECK CONTRACTURE (07-06-2004); Cataract extraction w/ intraocular lens implant (Right); Lumbar disc surgery (1996); Transurethral resection of bladder neck (N/A, 06/23/2012); Transurethral resection of bladder tumor with gyrus  (turbt-gyrus) (N/A, 06/01/2013); Eye surgery; Spine surgery; and Knee arthroscopy (Right).   His family history includes Alzheimer's disease in his brother; Appendicitis (age of onset: 37) in his sister; CVA (age of onset: 76) in his mother; Cancer in his sister and sister; Congestive Heart Failure in his mother; Early death in his sister; Hip fracture in his brother; Kidney disease in his brother and sister; Obesity in his sister; Pancreatitis in his father; Pneumonia in his father; Prostate cancer in his brother.He reports that he quit smoking about 58 years ago. His smoking use included cigarettes. He quit smokeless tobacco use about 58 years ago.  His smokeless tobacco use included chew. He reports that he does not drink alcohol and does not use drugs.    ROS Review of Systems  Objective:  BP (!) 146/78   Pulse 97   Temp (!) 97.1 F (36.2 C)   Ht 6' (1.829 m)   Wt 223 lb 9.6 oz (101.4 kg)   SpO2 96%   BMI 30.33 kg/m   BP Readings from Last 3 Encounters:  09/17/21 (!) 146/78  08/19/21 (!) 153/76  06/25/21 (!) 139/59    Wt Readings from Last 3 Encounters:  09/17/21 223 lb 9.6 oz (101.4 kg)  08/19/21 224 lb 12.8 oz (102 kg)  06/25/21 228 lb 1 oz (103.4 kg)     Physical Exam    Assessment & Plan:   Renan was seen today for back pain.  Diagnoses and all orders for this visit:  Chronic  left-sided low back pain without sciatica  Closed compression fracture of L4 lumbar vertebra, initial encounter [facility]) -     Ambulatory referral to Interventional Radiology  Other orders -     HYDROcodone-acetaminophen ([place]) 5-325 MG tablet; Take 1 tablet by mouth every 6 (six) hours as needed for up to 15 days for moderate pain or severe pain. Take for moderate to severe pain       I have discontinued Sterling [employer] H. Utz's traMADol. I am also having him start on HYDROcodone-acetaminophen. Additionally, I am having him maintain his (FLAXSEED, LINSEED, PO), Leuprolide Acetate (6  Month), Vitamin D3, Magnesium, Probiotic Product (PROBIOTIC DAILY PO), aspirin, V-2 High Compression Hose, GARLIC PO, Multiple Vitamins-Minerals (CENTRUM SILVER ULTRA MENS PO), nabumetone, triamcinolone cream, methocarbamol, loratadine, sucralfate, atorvastatin, lisinopril, amLODipine, levothyroxine, enzalutamide, and acetaminophen.  Allergies as of 09/17/2021       Reactions   Caffeine Palpitations   Codeine Palpitations        Medication List        Accurate as of September 17, 2021  9:28 AM. If you have any questions, ask your nurse or doctor.          STOP taking these medications    traMADol 50 MG tablet Commonly known as: ULTRAM Stopped by: Claretta Fraise, MD       TAKE these medications    acetaminophen 500 MG tablet Commonly known as: TYLENOL Take 2 tablets (1,000 mg total) by mouth 3 (three) times daily.   amLODipine 10 MG tablet Commonly known as: NORVASC Take 1 tablet (10 mg total) by mouth daily.   aspirin 325 MG tablet Take 1 tablet (325 mg total) by mouth daily.   atorvastatin 40 MG tablet Commonly known as: LIPITOR Take 1 tablet (40 mg total) by mouth daily.   CENTRUM SILVER ULTRA MENS PO Take 1 tablet by mouth daily.   FLAXSEED (LINSEED) PO Take 30 mLs by mouth every morning. GRINDS FLAX SEEDS AND PUTS IN 1/4 CUP OF WATER   GARLIC PO Take 2 capsules by mouth daily.   HYDROcodone-acetaminophen 5-325 MG tablet Commonly known as: Norco Take 1 tablet by mouth every 6 (six) hours as needed for up to 15 days for moderate pain or severe pain. Take for moderate to severe pain Started by: Claretta Fraise, MD   Leuprolide Acetate (6 Month) 45 MG injection Commonly known as: LUPRON Inject 45 mg into the muscle every 6 (six) months.   levothyroxine 88 MCG tablet Commonly known as: SYNTHROID Take 1 tablet (88 mcg total) by mouth daily before breakfast.   lisinopril 40 MG tablet Commonly known as: ZESTRIL Take 1 tablet (40 mg total) by mouth daily.    loratadine 10 MG tablet Commonly known as: CLARITIN Take 10 mg by mouth daily.   Magnesium 300 MG Caps Take 1 capsule by mouth daily.   methocarbamol 500 MG tablet Commonly known as: ROBAXIN Take 1 tablet (500 mg total) by mouth 2 (two) times daily as needed for muscle spasms.   nabumetone 500 MG tablet Commonly known as: Relafen Take 1 tablet (500 mg total) by mouth 2 (two) times daily.   PROBIOTIC DAILY PO Take 1 capsule by mouth daily.   sucralfate 1 g tablet Commonly known as: Carafate Take 1 tablet (1 g total) by mouth 4 (four) times daily -  with meals and at bedtime. Crush 1 tablet in 1 oz water and drink 5 min before meals for radiation induced esophagitis   triamcinolone cream 0.1 %  Commonly known as: KENALOG Apply topically 2 (two) times daily.   V-2 High Compression Hose Misc Wear daily when up walking around   Vitamin D3 125 MCG (5000 UT) Caps Take 1 capsule by mouth daily.   Xtandi 40 MG tablet Generic drug: enzalutamide Take 160 mg by mouth daily.         Follow-up: No follow-ups on file.  Claretta Fraise, M.D.

## 2021-10-01 ENCOUNTER — Ambulatory Visit (INDEPENDENT_AMBULATORY_CARE_PROVIDER_SITE_OTHER): Payer: Medicare Other | Admitting: Family Medicine

## 2021-10-01 VITALS — BP 175/90 | HR 99 | Temp 97.4°F | Ht 72.0 in | Wt 221.4 lb

## 2021-10-01 DIAGNOSIS — S32040A Wedge compression fracture of fourth lumbar vertebra, initial encounter for closed fracture: Secondary | ICD-10-CM | POA: Diagnosis not present

## 2021-10-01 DIAGNOSIS — I10 Essential (primary) hypertension: Secondary | ICD-10-CM

## 2021-10-01 MED ORDER — HYDROCODONE-ACETAMINOPHEN 5-325 MG PO TABS: 1.0000 | ORAL_TABLET | Freq: Four times a day (QID) | ORAL | 0 refills | Status: DC | PRN

## 2021-10-01 NOTE — Progress Notes (Signed)
Subjective:  Patient ID: [name], male    DOB: 1932/06/14  Age: 88 y.o. MRN: [id number]  CC: Medication Refill   HPI [name] presents for 8/10 pain at left lower back. Radiating now to the left thigh. Good relief with hydrocodone. Found to have lumbar compression recently and started on opitate for acute relief. Also having increased BP along with the pain.      10/01/2021   11:04 AM 09/17/2021    8:58 AM 08/19/2021    8:20 AM  Depression screen PHQ 2/9  Decreased Interest 0 0 0  Down, Depressed, Hopeless 0 0 0  PHQ - 2 Score 0 0 0    History [name] has a past medical history of Aphasia, Arthritis, BNC (bladder neck contracture), BPPV (benign paroxysmal positional vertigo), Cataract, Diverticulosis, H/O diarrhea (SECONDARY TO RADIATION THERAPY --  INTERMITANT  DIARRHEA), Hematuria, History of hemorrhagic cystitis, History of prostate cancer (CURRENTLY  ELEVATED PSA--  LUPRON INJECTIONS), Hyperlipidemia, Hypertension, Hypothyroidism, Radiation cystitis, Radiation proctitis, Urinary incontinence, Urinary retention, and Vitamin D deficiency.   He has a past surgical history that includes Prostatectomy (1997); Colonoscopy w/ polypectomy (2005; 08/11/2010); CYSTO/ FULGERATION OF BLEEDERS/ RESECTION BLADDER NECK CONTRACTURE (07-06-2004); Cataract extraction w/ intraocular lens implant (Right); Lumbar disc surgery (1996); Transurethral resection of bladder neck (N/A, 06/23/2012); Transurethral resection of bladder tumor with gyrus (turbt-gyrus) (N/A, 06/01/2013); Eye surgery; Spine surgery; and Knee arthroscopy (Right).   His family history includes Alzheimer's disease in his brother; Appendicitis (age of onset: 56) in his sister; CVA (age of onset: 44) in his mother; Cancer in his sister and sister; Congestive Heart Failure in his mother; Early death in his sister; Hip fracture in his brother; Kidney disease in his brother and sister; Obesity in his sister; Pancreatitis in his father;  Pneumonia in his father; Prostate cancer in his brother.He reports that he quit smoking about 58 years ago. His smoking use included cigarettes. He quit smokeless tobacco use about 58 years ago.  His smokeless tobacco use included chew. He reports that he does not drink alcohol and does not use drugs.    ROS Review of Systems  Constitutional:  Negative for fever.  Respiratory:  Negative for shortness of breath.   Cardiovascular:  Negative for chest pain.  Musculoskeletal:  Negative for arthralgias.  Skin:  Negative for rash.    Objective:  BP (!) 175/90   Pulse 99   Temp (!) 97.4 F (36.3 C)   Ht 6' (1.829 m)   Wt 221 lb 6.4 oz (100.4 kg)   SpO2 96%   BMI 30.03 kg/m   BP Readings from Last 3 Encounters:  10/01/21 (!) 175/90  09/17/21 (!) 146/78  08/19/21 (!) 153/76    Wt Readings from Last 3 Encounters:  10/01/21 221 lb 6.4 oz (100.4 kg)  09/17/21 223 lb 9.6 oz (101.4 kg)  08/19/21 224 lb 12.8 oz (102 kg)     Physical Exam Vitals reviewed.  Constitutional:      Appearance: He is well-developed.  HENT:     Head: Normocephalic and atraumatic.     Right Ear: External ear normal.     Left Ear: External ear normal.     Mouth/Throat:     Pharynx: No oropharyngeal exudate or posterior oropharyngeal erythema.  Eyes:     Pupils: Pupils are equal, round, and reactive to light.  Cardiovascular:     Rate and Rhythm: Normal rate and regular rhythm.     Heart sounds: No  murmur heard. Pulmonary:     Effort: No respiratory distress.     Breath sounds: Normal breath sounds.  Musculoskeletal:        General: Tenderness (with spinal flexion at lumbar region) present.     Cervical back: Normal range of motion and neck supple.  Neurological:     Mental Status: He is alert and oriented to person, place, and time.       Assessment & Plan:   [name] was seen today for medication refill.  Diagnoses and all orders for this visit:  Closed compression fracture of L4 lumbar  vertebra, initial encounter [facility]) -     Ambulatory referral to Interventional Radiology  Accelerated hypertension  Other orders -     HYDROcodone-acetaminophen (NORCO) 5-325 MG tablet; Take 1 tablet by mouth every 6 (six) hours as needed for up to 15 days for moderate pain or severe pain. Take for moderate to severe pain       I am having [name] maintain his (FLAXSEED, LINSEED, PO), Leuprolide Acetate (6 Month), Vitamin D3, Magnesium, Probiotic Product (PROBIOTIC DAILY PO), aspirin, V-2 High Compression Hose, GARLIC PO, Multiple Vitamins-Minerals (CENTRUM SILVER ULTRA MENS PO), nabumetone, triamcinolone cream, methocarbamol, loratadine, sucralfate, atorvastatin, lisinopril, amLODipine, levothyroxine, enzalutamide, acetaminophen, and HYDROcodone-acetaminophen.  Allergies as of 10/01/2021       Reactions   Caffeine Palpitations   Codeine Palpitations        Medication List        Accurate as of October 01, 2021 11:59 PM. If you have any questions, ask your nurse or doctor.          acetaminophen 500 MG tablet Commonly known as: TYLENOL Take 2 tablets (1,000 mg total) by mouth 3 (three) times daily.   amLODipine 10 MG tablet Commonly known as: NORVASC Take 1 tablet (10 mg total) by mouth daily.   aspirin 325 MG tablet Take 1 tablet (325 mg total) by mouth daily.   atorvastatin 40 MG tablet Commonly known as: LIPITOR Take 1 tablet (40 mg total) by mouth daily.   CENTRUM SILVER ULTRA MENS PO Take 1 tablet by mouth daily.   FLAXSEED (LINSEED) PO Take 30 mLs by mouth every morning. GRINDS FLAX SEEDS AND PUTS IN 1/4 CUP OF WATER   GARLIC PO Take 2 capsules by mouth daily.   HYDROcodone-acetaminophen 5-325 MG tablet Commonly known as: Norco Take 1 tablet by mouth every 6 (six) hours as needed for up to 15 days for moderate pain or severe pain. Take for moderate to severe pain   Leuprolide Acetate (6 Month) 45 MG injection Commonly known as:  LUPRON Inject 45 mg into the muscle every 6 (six) months.   levothyroxine 88 MCG tablet Commonly known as: SYNTHROID Take 1 tablet (88 mcg total) by mouth daily before breakfast.   lisinopril 40 MG tablet Commonly known as: ZESTRIL Take 1 tablet (40 mg total) by mouth daily.   loratadine 10 MG tablet Commonly known as: CLARITIN Take 10 mg by mouth daily.   Magnesium 300 MG Caps Take 1 capsule by mouth daily.   methocarbamol 500 MG tablet Commonly known as: ROBAXIN Take 1 tablet (500 mg total) by mouth 2 (two) times daily as needed for muscle spasms.   nabumetone 500 MG tablet Commonly known as: Relafen Take 1 tablet (500 mg total) by mouth 2 (two) times daily.   PROBIOTIC DAILY PO Take 1 capsule by mouth daily.   sucralfate 1 g tablet Commonly known as: Carafate Take  1 tablet (1 g total) by mouth 4 (four) times daily -  with meals and at bedtime. Crush 1 tablet in 1 oz water and drink 5 min before meals for radiation induced esophagitis   triamcinolone cream 0.1 % Commonly known as: KENALOG Apply topically 2 (two) times daily.   V-2 High Compression Hose Misc Wear daily when up walking around   Vitamin D3 125 MCG (5000 UT) Caps Take 1 capsule by mouth daily.   Xtandi 40 MG tablet Generic drug: enzalutamide Take 160 mg by mouth daily.         Follow-up: Return in about 2 weeks (around 10/15/2021) for , and BP unless seen first by interventional radiology.  Claretta Fraise, M.D.

## 2021-10-04 ENCOUNTER — Encounter: Payer: Self-pay | Admitting: Family Medicine

## 2021-10-05 ENCOUNTER — Telehealth: Payer: Self-pay | Admitting: Family Medicine

## 2021-10-06 ENCOUNTER — Ambulatory Visit: Payer: Medicare Other | Admitting: Internal Medicine

## 2021-10-07 ENCOUNTER — Telehealth: Payer: Self-pay | Admitting: Family Medicine

## 2021-10-07 NOTE — Telephone Encounter (Signed)
Please call patient regarding update on referral.

## 2021-10-08 ENCOUNTER — Telehealth (HOSPITAL_COMMUNITY): Payer: Self-pay | Admitting: Radiology

## 2021-10-08 NOTE — Telephone Encounter (Signed)
Received a referral for Dr. Estanislado Pandy for L4 VP. After review of the imaging Dr. Estanislado Pandy states that this fracture is not safe to treat. Per Deveshwar the vertebral body is too flat. I relayed this message back to the referring office [facility]). JM

## 2021-10-15 ENCOUNTER — Telehealth: Payer: Self-pay | Admitting: Family Medicine

## 2021-10-15 ENCOUNTER — Other Ambulatory Visit: Payer: Self-pay | Admitting: Family Medicine

## 2021-10-15 ENCOUNTER — Ambulatory Visit: Payer: Medicare Other | Admitting: Family Medicine

## 2021-10-15 ENCOUNTER — Encounter: Payer: Self-pay | Admitting: *Deleted

## 2021-10-15 ENCOUNTER — Ambulatory Visit: Payer: Self-pay | Admitting: *Deleted

## 2021-10-15 DIAGNOSIS — S32040A Wedge compression fracture of fourth lumbar vertebra, initial encounter for closed fracture: Secondary | ICD-10-CM

## 2021-10-15 MED ORDER — HYDROCODONE-ACETAMINOPHEN 5-325 MG PO TABS: 1.0000 | ORAL_TABLET | Freq: Four times a day (QID) | ORAL | 0 refills | Status: AC | PRN

## 2021-10-15 NOTE — Telephone Encounter (Signed)
Please let the patient know that I sent their prescription to their pharmacy. Thanks, WS 

## 2021-10-15 NOTE — Patient Outreach (Signed)
  Care Coordination   Initial Visit Note   10/15/2021  Name: [name] MRN: [id number] DOB: 28-Jun-1932  [name] is a 88 y.o. year old male who sees Stacks, Cletus Gash, MD for primary care. I spoke with [name] by phone today.  What matters to the patients health and wellness today?   No Interventions Identified.  CSW collaboration with Primary Care Provider, [name] to request refill of patient's prescription medication - Hydrocodone-Acetaminophen (Norco) 5-325 MG, PO, PRN.  Patient admitted to experiencing severe back pain due to fracture.  SDOH assessments and interventions completed:  Yes.  SDOH Interventions Today    Flowsheet Row Most Recent Value  SDOH Interventions   Food Insecurity Interventions Intervention Not Indicated  Housing Interventions Intervention Not Indicated  Transportation Interventions Intervention Not Indicated  Utilities Interventions Intervention Not Indicated  Alcohol Usage Interventions Intervention Not Indicated (Score <7)  Financial Strain Interventions Intervention Not Indicated  Physical Activity Interventions Intervention Not Indicated  Stress Interventions Intervention Not Indicated  Social Connections Interventions Intervention Not Indicated       Care Coordination Interventions Activated:  Yes.    Care Coordination Interventions:  Yes, provided.    Follow up plan: No further intervention required.    Encounter Outcome:  Pt. Visit Completed.    [name], BSW, MSW, LCSW  Licensed Education officer, [facility]  Mailing [place] [address], [place], [place] [address] Physical Address-[address] [place], [place] [address] Toll Free Main # [phone] Fax # [phone] Cell # [phone] [name]

## 2021-10-15 NOTE — Patient Instructions (Signed)
Visit Information  Thank you for taking time to visit with me today. Please don't hesitate to contact me if I can be of assistance to you.   Please call the care guide team at [phone] if you need to cancel or reschedule your appointment.   If you are experiencing a Mental Health or Behavioral Health Crisis or need someone to talk to, please call the Suicide and Crisis Lifeline: 988 call the [place] National Suicide Prevention Lifeline: 1-800-273-8255 or TTY: 1-800-799-4 TTY (1-800-799-4889) to talk to a trained counselor call 1-800-273-TALK (toll free, 24 hour hotline) go to [facility] [address], [place] ([phone]) call the [place] Crisis Line: 800-939-9988 call 911  Patient verbalizes understanding of instructions and care plan provided today and agrees to view in MyChart. Active MyChart status and patient understanding of how to access instructions and care plan via MyChart confirmed with patient.     No further follow up required.  Lucretia Pendley, BSW, MSW, LCSW  Licensed Clinical Social Worker  [facility] Management  System  Mailing Address-[address], [place], [place] [address] Physical Address-[address], [place], [place] [address] Toll Free Main # [phone] Fax # [phone] Cell # [phone] [email or web address]            

## 2021-10-15 NOTE — Telephone Encounter (Signed)
Wife aware

## 2021-10-15 NOTE — Telephone Encounter (Signed)
Referral to INterventional radiology was declined, so we are waiting on ortho.

## 2021-10-15 NOTE — Telephone Encounter (Signed)
Pts wife called to check on status of this referral. Says pt is almost out of medicine.  Please advise and call wife with update.

## 2021-10-15 NOTE — Telephone Encounter (Signed)
Please advise. Seen you 09/07 and you put in referral. Do you want to work him in?

## 2021-10-19 ENCOUNTER — Telehealth: Payer: Self-pay | Admitting: Family Medicine

## 2021-10-19 NOTE — Telephone Encounter (Signed)
Faxed as requested

## 2021-10-19 NOTE — Telephone Encounter (Signed)
Pts wife called to let Dr Livia Snellen know that [facility] called them to schedule pt an appt. Pt is being seen on 11/10/21. Says that [facility] needs [place] to fax them pts xray/results.   Please call wife when they have been sent.

## 2021-10-20 DIAGNOSIS — M79676 Pain in unspecified toe(s): Secondary | ICD-10-CM | POA: Diagnosis not present

## 2021-10-20 DIAGNOSIS — L84 Corns and callosities: Secondary | ICD-10-CM | POA: Diagnosis not present

## 2021-10-20 DIAGNOSIS — I70203 Unspecified atherosclerosis of native arteries of extremities, bilateral legs: Secondary | ICD-10-CM | POA: Diagnosis not present

## 2021-10-20 DIAGNOSIS — B351 Tinea unguium: Secondary | ICD-10-CM | POA: Diagnosis not present

## 2021-10-27 ENCOUNTER — Ambulatory Visit (HOSPITAL_BASED_OUTPATIENT_CLINIC_OR_DEPARTMENT_OTHER)
Admission: RE | Admit: 2021-10-27 | Discharge: 2021-10-27 | Disposition: A | Discharge status: Home / Self Care | Payer: Medicare Other | Source: Ambulatory Visit | Attending: Internal Medicine | Admitting: Internal Medicine

## 2021-10-27 ENCOUNTER — Encounter (HOSPITAL_BASED_OUTPATIENT_CLINIC_OR_DEPARTMENT_OTHER): Payer: Self-pay

## 2021-10-27 ENCOUNTER — Inpatient Hospital Stay: Payer: Medicare Other | Attending: Oncology

## 2021-10-27 DIAGNOSIS — J479 Bronchiectasis, uncomplicated: Secondary | ICD-10-CM | POA: Diagnosis not present

## 2021-10-27 DIAGNOSIS — I700 Atherosclerosis of aorta: Secondary | ICD-10-CM | POA: Diagnosis not present

## 2021-10-27 DIAGNOSIS — C3490 Malignant neoplasm of unspecified part of unspecified bronchus or lung: Secondary | ICD-10-CM | POA: Insufficient documentation

## 2021-10-27 DIAGNOSIS — Z08 Encounter for follow-up examination after completed treatment for malignant neoplasm: Secondary | ICD-10-CM | POA: Insufficient documentation

## 2021-10-27 DIAGNOSIS — Z85118 Personal history of other malignant neoplasm of bronchus and lung: Secondary | ICD-10-CM | POA: Insufficient documentation

## 2021-10-27 LAB — CMP (CANCER CENTER ONLY)
ALT: 11 U/L (ref 0–44)
AST: 16 U/L (ref 15–41)
Albumin: 4.1 g/dL (ref 3.5–5.0)
Alkaline Phosphatase: 85 U/L (ref 38–126)
Anion gap: 9 (ref 5–15)
BUN: 18 mg/dL (ref 8–23)
CO2: 25 mmol/L (ref 22–32)
Calcium: 9.8 mg/dL (ref 8.9–10.3)
Chloride: 102 mmol/L (ref 98–111)
Creatinine: 0.91 mg/dL (ref 0.61–1.24)
GFR, Estimated: >60 mL/min (ref >60)
Glucose, Bld: 101 mg/dL — ABNORMAL HIGH (ref 70–99)
Potassium: 4.9 mmol/L (ref 3.5–5.1)
Sodium: 136 mmol/L (ref 135–145)
Total Bilirubin: 0.6 mg/dL (ref 0.3–1.2)
Total Protein: 7 g/dL (ref 6.5–8.1)

## 2021-10-27 LAB — CBC WITH DIFFERENTIAL (CANCER CENTER ONLY)
Abs Immature Granulocytes: 0.03 10*3/uL (ref 0.00–0.07)
Basophils Absolute: 0 10*3/uL (ref 0.0–0.1)
Basophils Relative: 0 %
Eosinophils Absolute: 0.2 10*3/uL (ref 0.0–0.5)
Eosinophils Relative: 3 %
HCT: 41.7 % (ref 39.0–52.0)
Hemoglobin: 13.7 g/dL (ref 13.0–17.0)
Immature Granulocytes: 0 %
Lymphocytes Relative: 18 %
Lymphs Abs: 1.4 10*3/uL (ref 0.7–4.0)
MCH: 31.6 pg (ref 26.0–34.0)
MCHC: 32.9 g/dL (ref 30.0–36.0)
MCV: 96.1 fL (ref 80.0–100.0)
Monocytes Absolute: 0.9 10*3/uL (ref 0.1–1.0)
Monocytes Relative: 11 %
Neutro Abs: 5.5 10*3/uL (ref 1.7–7.7)
Neutrophils Relative %: 68 %
Platelet Count: 255 10*3/uL (ref 150–400)
RBC: 4.34 MIL/uL (ref 4.22–5.81)
RDW: 14.2 % (ref 11.5–15.5)
WBC Count: 8.1 10*3/uL (ref 4.0–10.5)
nRBC: 0 % (ref 0.0–0.2)

## 2021-10-29 ENCOUNTER — Ambulatory Visit: Payer: Medicare Other | Admitting: Internal Medicine

## 2021-11-03 ENCOUNTER — Inpatient Hospital Stay (HOSPITAL_BASED_OUTPATIENT_CLINIC_OR_DEPARTMENT_OTHER): Payer: Medicare Other | Admitting: Internal Medicine

## 2021-11-03 ENCOUNTER — Other Ambulatory Visit: Payer: Self-pay

## 2021-11-03 VITALS — BP 161/74 | HR 94 | Temp 98.3°F | Resp 15 | Wt 220.1 lb

## 2021-11-03 DIAGNOSIS — C3490 Malignant neoplasm of unspecified part of unspecified bronchus or lung: Secondary | ICD-10-CM

## 2021-11-03 NOTE — Progress Notes (Signed)
[facility] [phone]   Fax:([phone]) [place], MD [place] [address]  DIAGNOSIS: Stage IIIA (T1c, N2, M0) lung cancer likely non-small cell lung cancer pending tissue diagnosis and presented with left upper lobe lung nodule in addition to AP window and subcarinal lymphadenopathy.  There was also indeterminate focus of mild hyper metabolic activity in the liver that need close monitoring.  The patient is high risk for anesthesia and he did not have a tissue diagnosis.  PRIOR THERAPY: Palliative radiotherapy to the left upper lobe lung nodule and mediastinal lymphadenopathy under the care of Dr. Lisbeth Renshaw completed March 27, 2021  CURRENT THERAPY: Observation.  INTERVAL HISTORY: [name] 88 y.o. male returns to the clinic today for follow-up visit accompanied by his daughter.  The patient is feeling fine except for the baseline fatigue and occasional shortness of breath with exertion.  He denied having any current chest pain, cough or hemoptysis.  He has no nausea, vomiting, diarrhea or constipation.  He has no headache or visual changes.  He denied having any fever or chills.  He is here today for evaluation with repeat CT scan of the chest for restaging of his disease.  MEDICAL HISTORY: Past Medical History:  Diagnosis Date   Aphasia    Arthritis    BNC (bladder neck contracture)    BPPV (benign paroxysmal positional vertigo)    Cataract    bilateral   Diverticulosis    H/O diarrhea SECONDARY TO RADIATION THERAPY --  INTERMITANT  DIARRHEA   Hematuria    History of hemorrhagic cystitis    SECONDARY RADIATION THERAPY 1997   History of prostate cancer CURRENTLY  ELEVATED PSA--  LUPRON INJECTIONS   S/P PROSTATECTOMY AND RADIATION THERAPY  1997   Hyperlipidemia    Hypertension    Hypothyroidism    Radiation cystitis    Radiation proctitis    Urinary incontinence    Urinary retention    Vitamin D  deficiency     ALLERGIES:  is allergic to caffeine and codeine.  MEDICATIONS:  Current Outpatient Medications  Medication Sig Dispense Refill   acetaminophen (TYLENOL) 500 MG tablet Take 2 tablets (1,000 mg total) by mouth 3 (three) times daily. 100 tablet PRN   amLODipine (NORVASC) 10 MG tablet Take 1 tablet (10 mg total) by mouth daily. 90 tablet 3   aspirin 325 MG tablet Take 1 tablet (325 mg total) by mouth daily.     atorvastatin (LIPITOR) 40 MG tablet Take 1 tablet (40 mg total) by mouth daily. 90 tablet 3   Cholecalciferol (VITAMIN D3) 5000 UNITS CAPS Take 1 capsule by mouth daily.     Elastic Bandages & Supports (V-2 HIGH COMPRESSION HOSE) MISC Wear daily when up walking around 1 each 0   enzalutamide (XTANDI) 40 MG tablet Take 160 mg by mouth daily.     FLAXSEED, LINSEED, PO Take 30 mLs by mouth every morning. GRINDS FLAX SEEDS AND PUTS IN 1/4 CUP OF WATER     GARLIC PO Take 2 capsules by mouth daily.     Leuprolide Acetate, 6 Month, (LUPRON) 45 MG injection Inject 45 mg into the muscle every 6 (six) months.     levothyroxine (SYNTHROID) 88 MCG tablet Take 1 tablet (88 mcg total) by mouth daily before breakfast. 90 tablet 2   lisinopril (ZESTRIL) 40 MG tablet Take 1 tablet (40 mg total) by mouth daily. 90 tablet  3   loratadine (CLARITIN) 10 MG tablet Take 10 mg by mouth daily.     Magnesium 300 MG CAPS Take 1 capsule by mouth daily.     methocarbamol (ROBAXIN) 500 MG tablet Take 1 tablet (500 mg total) by mouth 2 (two) times daily as needed for muscle spasms. 10 tablet 0   Multiple Vitamins-Minerals (CENTRUM SILVER ULTRA MENS PO) Take 1 tablet by mouth daily.     nabumetone (RELAFEN) 500 MG tablet Take 1 tablet (500 mg total) by mouth 2 (two) times daily. 60 tablet 2   Probiotic Product (PROBIOTIC DAILY PO) Take 1 capsule by mouth daily.     sucralfate (CARAFATE) 1 g tablet Take 1 tablet (1 g total) by mouth 4 (four) times daily -  with meals and at bedtime. Crush 1 tablet in 1 oz  water and drink 5 min before meals for radiation induced esophagitis 120 tablet 0   triamcinolone cream (KENALOG) 0.1 % Apply topically 2 (two) times daily.     No current facility-administered medications for this visit.    SURGICAL HISTORY:  Past Surgical History:  Procedure Laterality Date   CATARACT EXTRACTION W/ INTRAOCULAR LENS IMPLANT Right    COLONOSCOPY W/ POLYPECTOMY  2005; 08/11/2010   2005: 1 cm hyperplastic sigmoid polyp, Dr. Wynetta Emery 2012: hyperplastic splenic flexure polyp -1 cm, diverticulosis, radiation proctitis, anal stenosis   CYSTO/ FULGERATION OF BLEEDERS/ RESECTION BLADDER NECK CONTRACTURE  07-06-2004   BNC AND RADIATION CYSTITIS   EYE SURGERY     KNEE ARTHROSCOPY Right    LUMBAR DISC SURGERY  1996   L4 -- L5   PROSTATECTOMY  1997   SPINE SURGERY     TRANSURETHRAL RESECTION OF BLADDER NECK N/A 06/23/2012   Procedure: TRANSURETHRAL RESECTION OF BLADDER NECK CONTRACTURE WITH GYRUS;  Surgeon: Claybon Jabs, MD;  Location: [facility];  Service: Urology;  Laterality: N/A;   TRANSURETHRAL RESECTION OF BLADDER TUMOR WITH GYRUS (TURBT-GYRUS) N/A 06/01/2013   Procedure: TRANSURETHRAL RESECTION OF BLADDER NECK CONTRACTURE WITH GYRUS (TURBT-GYRUS) AND INJECTION OF KENALOG;  Surgeon: Claybon Jabs, MD;  Location: [facility];  Service: Urology;  Laterality: N/A;    REVIEW OF SYSTEMS:  A comprehensive review of systems was negative except for: Constitutional: positive for fatigue Respiratory: positive for dyspnea on exertion Musculoskeletal: positive for arthralgias and muscle weakness   PHYSICAL EXAMINATION: General appearance: alert, cooperative, fatigued, and no distress Head: Normocephalic, without obvious abnormality, atraumatic Neck: no adenopathy, no JVD, supple, symmetrical, trachea midline, and thyroid not enlarged, symmetric, no tenderness/mass/nodules Lymph nodes: Cervical, supraclavicular, and axillary nodes normal. Resp: clear to  auscultation bilaterally Back: symmetric, no curvature. ROM normal. No CVA tenderness. Cardio: regular rate and rhythm, S1, S2 normal, no murmur, click, rub or gallop GI: soft, non-tender; bowel sounds normal; no masses,  no organomegaly Extremities: extremities normal, atraumatic, no cyanosis or edema  ECOG PERFORMANCE STATUS: 1 - Symptomatic but completely ambulatory  Blood pressure (!) 161/74, pulse 94, temperature 98.3 F (36.8 C), temperature source Temporal, resp. rate 15, weight 220 lb 1.6 oz (99.8 kg), SpO2 97 %.  LABORATORY DATA: Lab Results  Component Value Date   WBC 8.1 10/27/2021   HGB 13.7 10/27/2021   HCT 41.7 10/27/2021   MCV 96.1 10/27/2021   PLT 255 10/27/2021      Chemistry      Component Value Date/Time   NA 136 10/27/2021 1024   NA 134 08/19/2021 0908   K 4.9 10/27/2021 1024  CL 102 10/27/2021 1024   CO2 25 10/27/2021 1024   BUN 18 10/27/2021 1024   BUN 14 08/19/2021 0908   CREATININE 0.91 10/27/2021 1024   CREATININE 0.98 07/04/2012 1518      Component Value Date/Time   CALCIUM 9.8 10/27/2021 1024   ALKPHOS 85 10/27/2021 1024   AST 16 10/27/2021 1024   ALT 11 10/27/2021 1024   BILITOT 0.6 10/27/2021 1024       RADIOGRAPHIC STUDIES: CT Chest Wo Contrast  Result Date: 10/29/2021 CLINICAL DATA:  88 year old male with history of non-small cell lung cancer and prostate cancer. Follow-up examination. * Tracking Code: BO * EXAM: CT CHEST WITHOUT CONTRAST TECHNIQUE: Multidetector CT imaging of the chest was performed following the standard protocol without IV contrast. RADIATION DOSE REDUCTION: This exam was performed according to the departmental dose-optimization program which includes automated exposure control, adjustment of the mA and/or kV according to patient size and/or use of iterative reconstruction technique. COMPARISON:  Chest CT 06/24/2021.  PET-CT 01/23/2021. FINDINGS: Cardiovascular: Heart size is normal. There is no significant pericardial  fluid, thickening or pericardial calcification. There is aortic atherosclerosis, as well as atherosclerosis of the great vessels of the mediastinum and the coronary arteries, including calcified atherosclerotic plaque in the left main, left anterior descending, left circumflex and right coronary arteries. Mediastinum/Nodes: Partially calcified AP window lymph node measuring 1.5 cm in short axis, decreased in size compared to the prior examination. Subcarinal lymph nodes are prominent and borderline enlarged measuring up to 12 mm in short axis. No other definite lymphadenopathy is confidently identified on today's noncontrast CT examination. Small hiatal hernia. No axillary lymphadenopathy. Lungs/Pleura: Again noted is an area of nodular architectural distortion in the medial aspect of the left upper lobe at the site of the treated lesion, best appreciated on axial image 55 of series 4) where this currently measures approximately 2.3 x 1.2 cm, with increasing surrounding septal thickening and regional architectural distortion and volume loss, most compatible with an area of evolving postradiation mass-like fibrosis. No other definite suspicious appearing pulmonary nodules or masses are noted. No acute consolidative airspace disease. No pleural effusions. Scattered areas of linear scarring and a few scattered areas of mild cylindrical bronchiectasis are noted throughout the lung bases bilaterally. Upper Abdomen: Aortic atherosclerosis. Musculoskeletal: There are no aggressive appearing lytic or blastic lesions noted in the visualized portions of the skeleton. IMPRESSION: 1. Expected postprocedural changes of evolving postradiation mass-like fibrosis in the left upper lobe with no definitive findings to suggest locally recurrent disease or definite metastatic disease in the thorax. Regression of lymphadenopathy also noted in the mediastinum, as above. 2. Aortic atherosclerosis, in addition to left main and three-vessel  coronary artery disease. Aortic Atherosclerosis (ICD10-I70.0). Electronically Signed   By: Vinnie Langton M.D.   On: 10/29/2021 07:51    ASSESSMENT AND PLAN: This is a very pleasant 88 years old white male with Stage IIIA (T1c, N2, M0) lung cancer likely non-small cell lung cancer pending tissue diagnosis and presented with left upper lobe lung nodule in addition to AP window and subcarinal lymphadenopathy.  There was also indeterminate focus of mild hyper metabolic activity in the liver that need close monitoring.  The patient is high risk for anesthesia and he did not have a tissue diagnosis. The patient is status post palliative radiotherapy to the left upper lobe lung nodule in addition to the mediastinal lymphadenopathy under the care of Dr. Lisbeth Renshaw. The patient is currently on observation and he is feeling fine  with no concerning complaints except for the baseline fatigue and shortness of breath. He had repeat CT scan of the chest performed recently.  I personally and independently reviewed the scan and discussed the result with the patient and his daughter. His scan showed no concerning findings for disease progression and there was some mild regression of the lymphadenopathy in the mediastinum. I recommended for the patient to continue on observation with repeat CT scan of the chest in 6 months.  The patient is interested in having his follow-up visit and evaluation at the [facility] center closer to home. I will refer him to see Dr. Benay Spice with the repeat lab and scan in 6 months. He was advised to call if he has any other concerning issues in the interval. The patient voices understanding of current disease status and treatment options and is in agreement with the current care plan.  All questions were answered. The patient knows to call the clinic with any problems, questions or concerns. We can certainly see the patient much sooner if necessary.  The total time spent in the  appointment was 20 minutes.  Disclaimer: This note was dictated with voice recognition software. Similar sounding words can inadvertently be transcribed and may not be corrected upon review.

## 2021-11-05 ENCOUNTER — Telehealth: Payer: Self-pay | Admitting: Family Medicine

## 2021-11-05 NOTE — Telephone Encounter (Signed)
Wife says xray results are CT that were done at [facility] on 10/16/21. Needs those results sent to [facility].

## 2021-11-05 NOTE — Telephone Encounter (Signed)
[facility] pushed images over and patient notified

## 2021-11-05 NOTE — Telephone Encounter (Signed)
Pts wife called requesting that we send pts most recent xrays to [facility] about his back before his appt with them next week.  Call wife to let her know when xrays have been sent.

## 2021-11-10 DIAGNOSIS — M5451 Vertebrogenic low back pain: Secondary | ICD-10-CM | POA: Diagnosis not present

## 2021-11-10 DIAGNOSIS — M5459 Other low back pain: Secondary | ICD-10-CM | POA: Diagnosis not present

## 2021-11-28 DIAGNOSIS — M5459 Other low back pain: Secondary | ICD-10-CM | POA: Diagnosis not present

## 2021-11-28 DIAGNOSIS — M5451 Vertebrogenic low back pain: Secondary | ICD-10-CM | POA: Diagnosis not present

## 2021-12-02 DIAGNOSIS — H4911 Fourth [trochlear] nerve palsy, right eye: Secondary | ICD-10-CM | POA: Diagnosis not present

## 2021-12-02 DIAGNOSIS — H5203 Hypermetropia, bilateral: Secondary | ICD-10-CM | POA: Diagnosis not present

## 2021-12-02 DIAGNOSIS — H524 Presbyopia: Secondary | ICD-10-CM | POA: Diagnosis not present

## 2021-12-02 DIAGNOSIS — Z961 Presence of intraocular lens: Secondary | ICD-10-CM | POA: Diagnosis not present

## 2021-12-02 DIAGNOSIS — H26491 Other secondary cataract, right eye: Secondary | ICD-10-CM | POA: Diagnosis not present

## 2021-12-02 DIAGNOSIS — H52223 Regular astigmatism, bilateral: Secondary | ICD-10-CM | POA: Diagnosis not present

## 2021-12-04 DIAGNOSIS — M5451 Vertebrogenic low back pain: Secondary | ICD-10-CM | POA: Diagnosis not present

## 2021-12-14 ENCOUNTER — Telehealth: Payer: Self-pay | Admitting: Family Medicine

## 2021-12-14 NOTE — Telephone Encounter (Signed)
Patient's wife wants to talk to nurse about medication. Refused to give any other information. Please call back.

## 2021-12-14 NOTE — Telephone Encounter (Signed)
APPOINTMENT SCHEDULED

## 2021-12-16 ENCOUNTER — Ambulatory Visit (INDEPENDENT_AMBULATORY_CARE_PROVIDER_SITE_OTHER): Payer: Medicare Other | Admitting: Family Medicine

## 2021-12-16 ENCOUNTER — Encounter: Payer: Self-pay | Admitting: Family Medicine

## 2021-12-16 VITALS — BP 153/80 | HR 86 | Temp 97.6°F | Ht 72.0 in | Wt 220.0 lb

## 2021-12-16 DIAGNOSIS — E039 Hypothyroidism, unspecified: Secondary | ICD-10-CM | POA: Diagnosis not present

## 2021-12-16 DIAGNOSIS — I10 Essential (primary) hypertension: Secondary | ICD-10-CM | POA: Diagnosis not present

## 2021-12-16 DIAGNOSIS — S32040G Wedge compression fracture of fourth lumbar vertebra, subsequent encounter for fracture with delayed healing: Secondary | ICD-10-CM | POA: Diagnosis not present

## 2021-12-16 DIAGNOSIS — C3490 Malignant neoplasm of unspecified part of unspecified bronchus or lung: Secondary | ICD-10-CM

## 2021-12-16 DIAGNOSIS — Z79899 Other long term (current) drug therapy: Secondary | ICD-10-CM

## 2021-12-16 MED ORDER — HYDROCODONE-ACETAMINOPHEN 5-325 MG PO TABS: 1.0000 | ORAL_TABLET | Freq: Two times a day (BID) | ORAL | 0 refills | Status: DC

## 2021-12-16 NOTE — Progress Notes (Signed)
Subjective:  Patient ID: [name], male    DOB: 1932-11-27  Age: 88 y.o. MRN: [id number]  CC: Pain   HPI GEOVANY TRUDO presents for compression Fx in back. Also having right knee pain. Old injury. Compression fx treatment declined by interventional rdiology. Dr. Rolena Infante told him he would have to be admitted. Didn't say no but was not encouraging according to patient's wife. Back pain flared up this morning. Now 10/10. Pt. Wife angry, confrontational that he had to be seen. "Whycan't these patients in pain just have this medication called in?" When answered that it was due to their safety profile, risk of overdose and federal regulation, she became angry and confrontational. Before she stopped, I had to state that I would end the visit if she persisted. At that point she calmed enough for [place] to discuss his options including chronic opiate use. Lower back CT showed L4 compression. Had been given temorary supply f hydrocodone due to delay in getting him referred. Now, since the surgeouns are unwilling to do vertebroplasty, he would like to have extended use of analgesics.He also has a lung lesion noted most recently on PET>    12/16/2021    1:07 PM 10/15/2021    9:37 PM 10/01/2021   11:04 AM  Depression screen PHQ 2/9  Decreased Interest 1 0 0  Down, Depressed, Hopeless 1 0 0  PHQ - 2 Score 2 0 0  Altered sleeping 0    Tired, decreased energy 1    Change in appetite 0    Feeling bad or failure about yourself  1    Trouble concentrating 0    Moving slowly or fidgety/restless 0    Suicidal thoughts 0    PHQ-9 Score 4    Difficult doing work/chores Not difficult at all      History Kimo has a past medical history of Aphasia, Arthritis, BNC (bladder neck contracture), BPPV (benign paroxysmal positional vertigo), Cataract, Diverticulosis, H/O diarrhea (SECONDARY TO RADIATION THERAPY --  INTERMITANT  DIARRHEA), Hematuria, History of hemorrhagic cystitis, History of prostate cancer  (CURRENTLY  ELEVATED PSA--  LUPRON INJECTIONS), Hyperlipidemia, Hypertension, Hypothyroidism, Radiation cystitis, Radiation proctitis, Urinary incontinence, Urinary retention, and Vitamin D deficiency.   He has a past surgical history that includes Prostatectomy (1997); Colonoscopy w/ polypectomy (2005; 08/11/2010); CYSTO/ FULGERATION OF BLEEDERS/ RESECTION BLADDER NECK CONTRACTURE (07-06-2004); Cataract extraction w/ intraocular lens implant (Right); Lumbar disc surgery (1996); Transurethral resection of bladder neck (N/A, 06/23/2012); Transurethral resection of bladder tumor with gyrus (turbt-gyrus) (N/A, 06/01/2013); Eye surgery; Spine surgery; and Knee arthroscopy (Right).   His family history includes Alzheimer's disease in his brother; Appendicitis (age of onset: 51) in his sister; CVA (age of onset: 59) in his mother; Cancer in his sister and sister; Congestive Heart Failure in his mother; Early death in his sister; Hip fracture in his brother; Kidney disease in his brother and sister; Obesity in his sister; Pancreatitis in his father; Pneumonia in his father; Prostate cancer in his brother.He reports that he quit smoking about 58 years ago. His smoking use included cigarettes. He has been exposed to tobacco smoke. He quit smokeless tobacco use about 58 years ago.  His smokeless tobacco use included chew. He reports that he does not drink alcohol and does not use drugs.    ROS Review of Systems  Constitutional:  Positive for activity change and fatigue. Negative for fever.  HENT: Negative.    Cardiovascular:  Negative for chest pain.  Musculoskeletal:  Positive  for arthralgias, back pain and gait problem.  Skin:  Negative for rash.  Neurological:  Positive for weakness.    Objective:  BP (!) 153/80   Pulse 86   Temp 97.6 F (36.4 C)   Ht 6' (1.829 m)   Wt 220 lb (99.8 kg) Comment: patient reported  SpO2 97%   BMI 29.84 kg/m   BP Readings from Last 3 Encounters:  12/16/21 (!) 153/80   11/03/21 (!) 161/74  10/01/21 (!) 175/90    Wt Readings from Last 3 Encounters:  12/16/21 220 lb (99.8 kg)  11/03/21 220 lb 1.6 oz (99.8 kg)  10/01/21 221 lb 6.4 oz (100.4 kg)     Physical Exam Vitals reviewed.  Constitutional:      Appearance: He is well-developed.  HENT:     Head: Normocephalic and atraumatic.     Right Ear: External ear normal.     Left Ear: External ear normal.     Mouth/Throat:     Pharynx: No oropharyngeal exudate or posterior oropharyngeal erythema.  Eyes:     Pupils: Pupils are equal, round, and reactive to light.  Cardiovascular:     Rate and Rhythm: Normal rate and regular rhythm.     Heart sounds: No murmur heard. Pulmonary:     Effort: No respiratory distress.     Breath sounds: Normal breath sounds.  Musculoskeletal:        General: Tenderness (Midline L3-4 area) present.     Cervical back: Normal range of motion and neck supple.  Neurological:     Mental Status: He is alert and oriented to person, place, and time.       Assessment & Plan:   Toshiro was seen today for pain.  Diagnoses and all orders for this visit:  Closed compression fracture of L4 lumbar vertebra with delayed healing, subsequent encounter -     ToxASSURE Select 13 (MW), Urine  Controlled substance agreement signed -     Drug Screen 10 W/Conf, Se  Essential hypertension  Acquired hypothyroidism  Malignant neoplasm of unspecified part of unspecified bronchus or lung ([place])  Other orders -     HYDROcodone-acetaminophen (NORCO) 5-325 MG tablet; Take 1 tablet by mouth 2 (two) times daily after a meal. Take for moderate to severe pain    Pt. Has multiple reasons for compression - CA prostate and lung, plus hypothyroidism.   I am having Theotis Burrow. Cooner start on HYDROcodone-acetaminophen. I am also having him maintain his (FLAXSEED, LINSEED, PO), Leuprolide Acetate (6 Month), Vitamin D3, Magnesium, Probiotic Product (PROBIOTIC DAILY PO), aspirin, V-2 High  Compression Hose, GARLIC PO, Multiple Vitamins-Minerals (CENTRUM SILVER ULTRA MENS PO), loratadine, atorvastatin, lisinopril, amLODipine, levothyroxine, enzalutamide, and acetaminophen.  Allergies as of 12/16/2021       Reactions   Caffeine Palpitations   Codeine Palpitations        Medication List        Accurate as of December 16, 2021  4:45 PM. If you have any questions, ask your nurse or doctor.          acetaminophen 500 MG tablet Commonly known as: TYLENOL Take 2 tablets (1,000 mg total) by mouth 3 (three) times daily.   amLODipine 10 MG tablet Commonly known as: NORVASC Take 1 tablet (10 mg total) by mouth daily.   aspirin 325 MG tablet Take 1 tablet (325 mg total) by mouth daily.   atorvastatin 40 MG tablet Commonly known as: LIPITOR Take 1 tablet (40 mg total) by mouth  daily.   CENTRUM SILVER ULTRA MENS PO Take 1 tablet by mouth daily.   FLAXSEED (LINSEED) PO Take 30 mLs by mouth every morning. GRINDS FLAX SEEDS AND PUTS IN 1/4 CUP OF WATER   GARLIC PO Take 2 capsules by mouth daily.   HYDROcodone-acetaminophen 5-325 MG tablet Commonly known as: Norco Take 1 tablet by mouth 2 (two) times daily after a meal. Take for moderate to severe pain Started by: Claretta Fraise, MD   Leuprolide Acetate (6 Month) 45 MG injection Commonly known as: LUPRON Inject 45 mg into the muscle every 6 (six) months.   levothyroxine 88 MCG tablet Commonly known as: SYNTHROID Take 1 tablet (88 mcg total) by mouth daily before breakfast.   lisinopril 40 MG tablet Commonly known as: ZESTRIL Take 1 tablet (40 mg total) by mouth daily.   loratadine 10 MG tablet Commonly known as: CLARITIN Take 10 mg by mouth daily.   Magnesium 300 MG Caps Take 1 capsule by mouth daily.   PROBIOTIC DAILY PO Take 1 capsule by mouth daily.   V-2 High Compression Hose Misc Wear daily when up walking around   Vitamin D3 125 MCG (5000 UT) Caps Take 1 capsule by mouth daily.   Xtandi  40 MG tablet Generic drug: enzalutamide Take 160 mg by mouth daily.         Follow-up: Return in about 1 month (around 01/15/2022).  Claretta Fraise, M.D.

## 2021-12-22 LAB — DRUG SCREEN 10 W/CONF, SERUM
Amphetamines, IA: NEGATIVE ng/mL
Barbiturates, IA: NEGATIVE ug/mL
Benzodiazepines, IA: NEGATIVE ng/mL
Cocaine & Metabolite, IA: NEGATIVE ng/mL
Methadone, IA: NEGATIVE ng/mL
Opiates, IA: NEGATIVE ng/mL
Oxycodones, IA: NEGATIVE ng/mL
Phencyclidine, IA: NEGATIVE ng/mL
Propoxyphene, IA: NEGATIVE ng/mL
THC(Marijuana) Metabolite, IA: NEGATIVE ng/mL

## 2022-01-12 ENCOUNTER — Telehealth: Payer: Self-pay

## 2022-01-12 NOTE — Telephone Encounter (Signed)
This nurse received a message from this patients daughter, Mateo Flow, wanting to check the status of request to be transferred to Dr. Bernette Redbird care due to [place] location be closer to his home.  This nurse advise that a message has been sent to the navigator for his office and they will either respond to [place] or reach out to them.  She acknowledged understanding.  No further questions at this time.  This nurse will follow up.

## 2022-01-21 ENCOUNTER — Other Ambulatory Visit: Payer: Self-pay | Admitting: Physician Assistant

## 2022-01-21 NOTE — Progress Notes (Signed)
This nurse received a call from this nurse on 12/19 from this patient daughter she wanted to know the status of the transfer of Care to Dr. Ammie Dalton.  This nurse advised daughter that a message will be sent to the care coordinator for Dr. Benay Spice to check on the status of the transfer.  She acknowledged understanding.  No further questions or care at this time.

## 2022-01-21 NOTE — Progress Notes (Signed)
This nurse sent a message to current provider as well as Dr. Benay Spice to check on doing transfer of care of this patient.  This nurse will reach out to daughter to let her know what the current status of the transfer of care is.  No further concerns at this time.

## 2022-01-22 ENCOUNTER — Telehealth: Payer: Self-pay

## 2022-01-22 NOTE — Telephone Encounter (Signed)
This nurse reached out to patients daughter Mateo Flow, and let her know that Dr. Benay Spice did respond to [place] and let us know that he is in agreement with seeing this patient.  He was made aware that patient needs a follow up and CT in April. Advised that their office will reach out to her to get his appointments set up for his follow up.  Daughter acknowledged understanding.  No further questions or concerns noted at this time.

## 2022-01-26 DIAGNOSIS — I70203 Unspecified atherosclerosis of native arteries of extremities, bilateral legs: Secondary | ICD-10-CM | POA: Diagnosis not present

## 2022-01-26 DIAGNOSIS — B351 Tinea unguium: Secondary | ICD-10-CM | POA: Diagnosis not present

## 2022-01-26 DIAGNOSIS — M79676 Pain in unspecified toe(s): Secondary | ICD-10-CM | POA: Diagnosis not present

## 2022-01-26 DIAGNOSIS — L84 Corns and callosities: Secondary | ICD-10-CM | POA: Diagnosis not present

## 2022-01-28 DIAGNOSIS — H35033 Hypertensive retinopathy, bilateral: Secondary | ICD-10-CM | POA: Diagnosis not present

## 2022-01-28 DIAGNOSIS — H26493 Other secondary cataract, bilateral: Secondary | ICD-10-CM | POA: Diagnosis not present

## 2022-01-28 DIAGNOSIS — H26491 Other secondary cataract, right eye: Secondary | ICD-10-CM | POA: Diagnosis not present

## 2022-01-28 DIAGNOSIS — Z961 Presence of intraocular lens: Secondary | ICD-10-CM | POA: Diagnosis not present

## 2022-01-28 DIAGNOSIS — H4911 Fourth [trochlear] nerve palsy, right eye: Secondary | ICD-10-CM | POA: Diagnosis not present

## 2022-02-19 ENCOUNTER — Ambulatory Visit (INDEPENDENT_AMBULATORY_CARE_PROVIDER_SITE_OTHER): Payer: Medicare Other

## 2022-02-19 VITALS — Ht 72.0 in | Wt 225.0 lb

## 2022-02-19 DIAGNOSIS — Z0000 Encounter for general adult medical examination without abnormal findings: Secondary | ICD-10-CM

## 2022-02-19 NOTE — Patient Instructions (Signed)
[name] , Thank you for taking time to come for your Medicare Wellness Visit. I appreciate your ongoing commitment to your health goals. Please review the following plan we discussed and let me know if I can assist you in the future.   These are the goals we discussed:  Goals       DIET - EAT MORE FRUITS AND VEGETABLES      DIET - EAT MORE FRUITS AND VEGETABLES (pt-stated)      Have 3-5 servings of fruits and vegetables per day.      DIET - INCREASE WATER INTAKE      Increase water intake during the day but limit fluids after 6 pm      Prevent falls      Use your cane at all times when ambulating        This is a list of the screening recommended for you and due dates:  Health Maintenance  Topic Date Due   COVID-19 Vaccine (1) Never done   DTaP/Tdap/Td vaccine (1 - Tdap) Never done   Zoster (Shingles) Vaccine (1 of 2) Never done   Flu Shot  04/25/2022*   Medicare Annual Wellness Visit  02/20/2023   Pneumonia Vaccine  Completed   HPV Vaccine  Aged Out   DEXA scan (bone density measurement)  Discontinued  *Topic was postponed. The date shown is not the original due date.    Advanced directives: Please bring a copy of your health care power of attorney and living will to the office to be added to your chart at your convenience.   Conditions/risks identified:   Next appointment: Follow up in one year for your annual wellness visit.   Preventive Care 48 Years and Older, Male  Preventive care refers to lifestyle choices and visits with your health care provider that can promote health and wellness. What does preventive care include? A yearly physical exam. This is also called an annual well check. Dental exams once or twice a year. Routine eye exams. Ask your health care provider how often you should have your eyes checked. Personal lifestyle choices, including: Daily care of your teeth and gums. Regular physical activity. Eating a healthy diet. Avoiding tobacco and  drug use. Limiting alcohol use. Practicing safe sex. Taking low doses of aspirin every day. Taking vitamin and mineral supplements as recommended by your health care provider. What happens during an annual well check? The services and screenings done by your health care provider during your annual well check will depend on your age, overall health, lifestyle risk factors, and family history of disease. Counseling  Your health care provider may ask you questions about your: Alcohol use. Tobacco use. Drug use. Emotional well-being. Home and relationship well-being. Sexual activity. Eating habits. History of falls. Memory and ability to understand (cognition). Work and work Statistician. Screening  You may have the following tests or measurements: Height, weight, and BMI. Blood pressure. Lipid and cholesterol levels. These may be checked every 5 years, or more frequently if you are over 74 years old. Skin check. Lung cancer screening. You may have this screening every year starting at age 60 if you have a 30-pack-year history of smoking and currently smoke or have quit within the past 15 years. Fecal occult blood test (FOBT) of the stool. You may have this test every year starting at age 19. Flexible sigmoidoscopy or colonoscopy. You may have a sigmoidoscopy every 5 years or a colonoscopy every 10 years starting at age 68.  Prostate cancer screening. Recommendations will vary depending on your family history and other risks. Hepatitis C blood test. Hepatitis B blood test. Sexually transmitted disease (STD) testing. Diabetes screening. This is done by checking your blood sugar (glucose) after you have not eaten for a while (fasting). You may have this done every 1-3 years. Abdominal aortic aneurysm (AAA) screening. You may need this if you are a current or former smoker. Osteoporosis. You may be screened starting at age 73 if you are at high risk. Talk with your health care provider  about your test results, treatment options, and if necessary, the need for more tests. Vaccines  Your health care provider may recommend certain vaccines, such as: Influenza vaccine. This is recommended every year. Tetanus, diphtheria, and acellular pertussis (Tdap, Td) vaccine. You may need a Td booster every 10 years. Zoster vaccine. You may need this after age 63. Pneumococcal 13-valent conjugate (PCV13) vaccine. One dose is recommended after age 68. Pneumococcal polysaccharide (PPSV23) vaccine. One dose is recommended after age 66. Talk to your health care provider about which screenings and vaccines you need and how often you need them. This information is not intended to replace advice given to you by your health care provider. Make sure you discuss any questions you have with your health care provider. Document Released: 02/07/2015 Document Revised: 10/01/2015 Document Reviewed: 11/12/2014 Elsevier Interactive Patient Education  2017 [employer] Prevention in the Home Falls can cause injuries. They can happen to people of all ages. There are many things you can do to make your home safe and to help prevent falls. What can I do on the outside of my home? Regularly fix the edges of walkways and driveways and fix any cracks. Remove anything that might make you trip as you walk through a door, such as a raised step or threshold. Trim any bushes or trees on the path to your home. Use bright outdoor lighting. Clear any walking paths of anything that might make someone trip, such as rocks or tools. Regularly check to see if handrails are loose or broken. Make sure that both sides of any steps have handrails. Any raised decks and porches should have guardrails on the edges. Have any leaves, snow, or ice cleared regularly. Use sand or salt on walking paths during winter. Clean up any spills in your garage right away. This includes oil or grease spills. What can I do in the  bathroom? Use night lights. Install grab bars by the toilet and in the tub and shower. Do not use towel bars as grab bars. Use non-skid mats or decals in the tub or shower. If you need to sit down in the shower, use a plastic, non-slip stool. Keep the floor dry. Clean up any water that spills on the floor as soon as it happens. Remove soap buildup in the tub or shower regularly. Attach bath mats securely with double-sided non-slip rug tape. Do not have throw rugs and other things on the floor that can make you trip. What can I do in the bedroom? Use night lights. Make sure that you have a light by your bed that is easy to reach. Do not use any sheets or blankets that are too big for your bed. They should not hang down onto the floor. Have a firm chair that has side arms. You can use this for support while you get dressed. Do not have throw rugs and other things on the floor that can make you trip.  What can I do in the kitchen? Clean up any spills right away. Avoid walking on wet floors. Keep items that you use a lot in easy-to-reach places. If you need to reach something above you, use a strong step stool that has a grab bar. Keep electrical cords out of the way. Do not use floor polish or wax that makes floors slippery. If you must use wax, use non-skid floor wax. Do not have throw rugs and other things on the floor that can make you trip. What can I do with my stairs? Do not leave any items on the stairs. Make sure that there are handrails on both sides of the stairs and use them. Fix handrails that are broken or loose. Make sure that handrails are as long as the stairways. Check any carpeting to make sure that it is firmly attached to the stairs. Fix any carpet that is loose or worn. Avoid having throw rugs at the top or bottom of the stairs. If you do have throw rugs, attach them to the floor with carpet tape. Make sure that you have a light switch at the top of the stairs and the  bottom of the stairs. If you do not have them, ask someone to add them for you. What else can I do to help prevent falls? Wear shoes that: Do not have high heels. Have rubber bottoms. Are comfortable and fit you well. Are closed at the toe. Do not wear sandals. If you use a stepladder: Make sure that it is fully opened. Do not climb a closed stepladder. Make sure that both sides of the stepladder are locked into place. Ask someone to hold it for you, if possible. Clearly mark and make sure that you can see: Any grab bars or handrails. First and last steps. Where the edge of each step is. Use tools that help you move around (mobility aids) if they are needed. These include: Canes. Walkers. Scooters. Crutches. Turn on the lights when you go into a dark area. Replace any light bulbs as soon as they burn out. Set up your furniture so you have a clear path. Avoid moving your furniture around. If any of your floors are uneven, fix them. If there are any pets around you, be aware of where they are. Review your medicines with your doctor. Some medicines can make you feel dizzy. This can increase your chance of falling. Ask your doctor what other things that you can do to help prevent falls. This information is not intended to replace advice given to you by your health care provider. Make sure you discuss any questions you have with your health care provider. Document Released: 11/07/2008 Document Revised: 06/19/2015 Document Reviewed: 02/15/2014 Elsevier Interactive Patient Education  2017 [employer].

## 2022-02-19 NOTE — Progress Notes (Signed)
Subjective:   [name] is a 89 y.o. male who presents for Medicare Annual/Subsequent preventive examination. I connected with  [name] on 02/19/22 by a audio enabled telemedicine application and verified that I am speaking with the correct person using two identifiers.  Patient Location: Home  Provider Location: Home Office  I discussed the limitations of evaluation and management by telemedicine. The patient expressed understanding and agreed to proceed.  Review of Systems     Cardiac Risk Factors include: advanced age (>23men, >66 women);male gender;hypertension     Objective:    Today's Vitals   02/19/22 1502  Weight: 225 lb (102.1 kg)   Body mass index is 30.52 kg/m.     02/19/2022    3:05 PM 10/15/2021    9:42 PM 06/25/2021    2:17 PM 02/24/2021    1:46 PM 01/27/2021    2:10 PM 01/11/2021    3:50 PM 10/29/2020    3:25 PM  Advanced Directives  Does Patient Have a Medical Advance Directive? Yes Yes No Yes Yes Yes Yes  Type of Paramedic of [place];Living will [name];Living will  [place];Living will Healthcare Power of [place] of [place];Living will  Does patient want to make changes to medical advance directive?  No - Patient declined  No - Patient declined     Copy of [name] in Chart? No - copy requested No - copy requested  No - copy requested No - copy requested No - copy requested No - copy requested  Would patient like information on creating a medical advance directive?   No - Patient declined No - Patient declined       Current Medications (verified) Outpatient Encounter Medications as of 02/19/2022  Medication Sig   acetaminophen (TYLENOL) 500 MG tablet Take 2 tablets (1,000 mg total) by mouth 3 (three) times daily.   amLODipine (NORVASC) 10 MG tablet Take 1 tablet (10 mg total) by mouth daily.   aspirin 325 MG  tablet Take 1 tablet (325 mg total) by mouth daily.   atorvastatin (LIPITOR) 40 MG tablet Take 1 tablet (40 mg total) by mouth daily.   Cholecalciferol (VITAMIN D3) 5000 UNITS CAPS Take 1 capsule by mouth daily.   Elastic Bandages & Supports (V-2 HIGH COMPRESSION HOSE) MISC Wear daily when up walking around   enzalutamide (XTANDI) 40 MG tablet Take 160 mg by mouth daily.   FLAXSEED, LINSEED, PO Take 30 mLs by mouth every morning. GRINDS FLAX SEEDS AND PUTS IN 1/4 CUP OF WATER   GARLIC PO Take 2 capsules by mouth daily.   HYDROcodone-acetaminophen (NORCO) 5-325 MG tablet Take 1 tablet by mouth 2 (two) times daily after a meal. Take for moderate to severe pain   Leuprolide Acetate, 6 Month, (LUPRON) 45 MG injection Inject 45 mg into the muscle every 6 (six) months.   levothyroxine (SYNTHROID) 88 MCG tablet Take 1 tablet (88 mcg total) by mouth daily before breakfast.   lisinopril (ZESTRIL) 40 MG tablet Take 1 tablet (40 mg total) by mouth daily.   loratadine (CLARITIN) 10 MG tablet Take 10 mg by mouth daily.   Magnesium 300 MG CAPS Take 1 capsule by mouth daily.   Multiple Vitamins-Minerals (CENTRUM SILVER ULTRA MENS PO) Take 1 tablet by mouth daily.   Probiotic Product (PROBIOTIC DAILY PO) Take 1 capsule by mouth daily.   No facility-administered encounter medications on file as  of 02/19/2022.    Allergies (verified) Caffeine and Codeine   History: Past Medical History:  Diagnosis Date   Aphasia    Arthritis    BNC (bladder neck contracture)    BPPV (benign paroxysmal positional vertigo)    Cataract    bilateral   Diverticulosis    H/O diarrhea SECONDARY TO RADIATION THERAPY --  INTERMITANT  DIARRHEA   Hematuria    History of hemorrhagic cystitis    SECONDARY RADIATION THERAPY 1997   History of prostate cancer CURRENTLY  ELEVATED PSA--  LUPRON INJECTIONS   S/P PROSTATECTOMY AND RADIATION THERAPY  1997   Hyperlipidemia    Hypertension    Hypothyroidism    Radiation cystitis     Radiation proctitis    Urinary incontinence    Urinary retention    Vitamin D deficiency    Past Surgical History:  Procedure Laterality Date   CATARACT EXTRACTION W/ INTRAOCULAR LENS IMPLANT Right    COLONOSCOPY W/ POLYPECTOMY  2005; 08/11/2010   2005: 1 cm hyperplastic sigmoid polyp, Dr. Wynetta Emery 2012: hyperplastic splenic flexure polyp -1 cm, diverticulosis, radiation proctitis, anal stenosis   CYSTO/ FULGERATION OF BLEEDERS/ RESECTION BLADDER NECK CONTRACTURE  07-06-2004   BNC AND RADIATION CYSTITIS   EYE SURGERY     KNEE ARTHROSCOPY Right    LUMBAR DISC SURGERY  1996   L4 -- L5   PROSTATECTOMY  1997   SPINE SURGERY     TRANSURETHRAL RESECTION OF BLADDER NECK N/A 06/23/2012   Procedure: TRANSURETHRAL RESECTION OF BLADDER NECK CONTRACTURE WITH GYRUS;  Surgeon: Claybon Jabs, MD;  Location: [facility];  Service: Urology;  Laterality: N/A;   TRANSURETHRAL RESECTION OF BLADDER TUMOR WITH GYRUS (TURBT-GYRUS) N/A 06/01/2013   Procedure: TRANSURETHRAL RESECTION OF BLADDER NECK CONTRACTURE WITH GYRUS (TURBT-GYRUS) AND INJECTION OF KENALOG;  Surgeon: Claybon Jabs, MD;  Location: [facility];  Service: Urology;  Laterality: N/A;   Family History  Problem Relation Age of Onset   Prostate cancer Brother    Kidney disease Brother    Congestive Heart Failure Mother    CVA Mother 62   Pancreatitis Father    Pneumonia Father    Cancer Sister        breast   Obesity Sister    Early death Sister        appendicitis   Appendicitis Sister 16       rupture   Cancer Sister        breast   Kidney disease Sister    Alzheimer's disease Brother    Hip fracture Brother    Social History   Socioeconomic History   Marital status: Married    Spouse name: Esmeralda Blanford   Number of children: 2   Years of education: 12+   Highest education level: Some college, no degree  Occupational History   Occupation: Retired    Comment: Administrator, arts for [employer]   Tobacco Use   Smoking status: Former    Years: 20.00    Types: Cigarettes    Quit date: 1965    Years since quitting: 59.1    Passive exposure: Past   Smokeless tobacco: Former    Types: Chew    Quit date: 1965   Tobacco comments:    SMOKED AND CHEW TOBACCO FOR 20 YRS QUIT AGE 23  Vaping Use   Vaping Use: Never used  Substance and Sexual Activity   Alcohol use: No   Drug use: No  Sexual activity: Not Currently    Partners: Female    Birth control/protection: None  Other Topics Concern   Not on file  Social History Narrative   Lives in one level home with his wife - their children live in [place] and [place] - both within 45 minutes   Social Determinants of Health   Financial Resource Strain: Low Risk  (02/19/2022)   Overall Financial Resource Strain (CARDIA)    Difficulty of Paying Living Expenses: Not hard at all  Food Insecurity: No Food Insecurity (02/19/2022)   Hunger Vital Sign    Worried About Running Out of Food in the Last Year: Never true    Ran Out of Food in the Last Year: Never true  Transportation Needs: No Transportation Needs (02/19/2022)   PRAPARE - Hydrologist (Medical): No    Lack of Transportation (Non-Medical): No  Physical Activity: Inactive (02/19/2022)   Exercise Vital Sign    Days of Exercise per Week: 0 days    Minutes of Exercise per Session: 0 min  Stress: No Stress Concern Present (02/19/2022)   [place]    Feeling of Stress : Not at all  Social Connections: Moderately Integrated (02/19/2022)   Social Connection and Isolation Panel [NHANES]    Frequency of Communication with Friends and Family: More than three times a week    Frequency of Social Gatherings with Friends and Family: More than three times a week    Attends Religious Services: 1 to 4 times per year    Active Member of [employer] or Organizations: No    Attends Arts administrator: Never    Marital Status: Married    Tobacco Counseling Counseling given: Not Answered Tobacco comments: SMOKED AND CHEW TOBACCO FOR 20 YRS QUIT AGE 46   Clinical Intake:  Pre-visit preparation completed: Yes  Pain : No/denies pain     Nutritional Risks: None Diabetes: No  How often do you need to have someone help you when you read instructions, pamphlets, or other written materials from your doctor or pharmacy?: 1 - Never  Diabetic?no   Interpreter Needed?: No  Information entered by :: Jadene Pierini, LPN   Activities of Daily Living    02/19/2022    3:05 PM 10/15/2021    9:41 PM  In your present state of health, do you have any difficulty performing the following activities:  Hearing? 0 0  Vision? 0 0  Difficulty concentrating or making decisions? 0 0  Walking or climbing stairs? 0 0  Dressing or bathing? 0 0  Doing errands, shopping? 0 0  Preparing Food and eating ? N N  Using the Toilet? N N  In the past six months, have you accidently leaked urine? N N  Do you have problems with loss of bowel control? N N  Managing your Medications? N N  Managing your Finances? N N  Housekeeping or managing your Housekeeping? N N    Patient Care Team: Claretta Fraise, MD as PCP - General (Family Medicine) Kathie Rhodes, MD (Inactive) as Consulting Physician (Urology) Sandford Craze, MD as Consulting Physician (Dermatology) Hayden Pedro, MD as Consulting Physician (Ophthalmology) Netta Cedars, MD as Consulting Physician (Orthopedic Surgery) Rosalin Hawking, MD as Consulting Physician (Neurology)  Indicate any recent Medical Services you may have received from other than [facility] providers in the past year (date may be approximate).     Assessment:   This is a  routine wellness examination for [place].  Hearing/Vision screen Vision Screening - Comments:: Wears rx glasses - up to date with routine eye exams with  Dr.Hecker  Dietary issues and exercise  activities discussed: Current Exercise Habits: The patient does not participate in regular exercise at present, Exercise limited by: orthopedic condition(s)   Goals Addressed             This Visit's Progress    DIET - EAT MORE FRUITS AND VEGETABLES   On track    DIET - INCREASE WATER INTAKE   On track    Increase water intake during the day but limit fluids after 6 pm       Depression Screen    02/19/2022    3:04 PM 12/16/2021    1:07 PM 10/15/2021    9:37 PM 10/01/2021   11:04 AM 09/17/2021    8:58 AM 08/19/2021    8:20 AM 05/06/2021    8:58 AM  PHQ 2/9 Scores  PHQ - 2 Score 0 2 0 0 0 0 1  PHQ- 9 Score  4     1    Fall Risk    02/19/2022    3:03 PM 12/16/2021    1:03 PM 10/15/2021    9:40 PM 10/15/2021    9:33 PM 10/01/2021   11:03 AM  Fall Risk   Falls in the past year? 0 0 0 0 0  Number falls in past yr: 0  0    Injury with Fall? 0  0    Risk for fall due to : No Fall Risks  No Fall Risks    Follow up Falls prevention discussed  Falls prevention discussed;Education provided;Falls evaluation completed      FALL RISK PREVENTION PERTAINING TO THE HOME:  Any stairs in or around the home? Yes  If so, are there any without handrails? No  Home free of loose throw rugs in walkways, pet beds, electrical cords, etc? No  Adequate lighting in your home to reduce risk of falls? No   ASSISTIVE DEVICES UTILIZED TO PREVENT FALLS:  Life alert? No  Use of a cane, walker or w/c? Yes  Grab bars in the bathroom? Yes  Shower chair or bench in shower? Yes  Elevated toilet seat or a handicapped toilet? Yes       01/30/2018    9:45 AM 01/20/2017    9:04 AM 01/15/2016    8:29 AM 01/16/2015    8:36 AM  MMSE - Mini Mental State Exam  Orientation to time 5 5 5 5   Orientation to Place 5 5 5 5   Registration 3 3 3 3   Attention/ Calculation 5 5 3 3   Recall 3 2 3 3   Language- name 2 objects 2 2 2 2   Language- repeat 0 1 1 1   Language- follow 3 step command 3 3 3 3   Language- read &  follow direction 1 1 1 1   Write a sentence 1 1 1 1   Copy design 1 1 1  0  Total score 29 29 28 27         02/19/2022    3:05 PM 02/08/2019    8:48 AM  6CIT Screen  What Year? 0 points 0 points  What month? 0 points 0 points  What time? 0 points 0 points  Count back from 20 0 points 0 points  Months in reverse 0 points 0 points  Repeat phrase 0 points 2 points  Total Score 0 points 2 points  Immunizations Immunization History  Administered Date(s) Administered   Fluad Quad(high Dose 65+) 11/07/2018   Hepatitis B 07/22/1993, 08/26/1993, 04/28/1994   Influenza, High Dose Seasonal PF 11/10/2015, 11/25/2016, 11/22/2017   Influenza,inj,Quad PF,6+ Mos 11/07/2013   Influenza-Unspecified 11/25/2012, 12/09/2014   Pneumococcal Conjugate-13 01/14/2014   Pneumococcal Polysaccharide-23 07/04/2012   Zoster, Live 01/05/2013    TDAP status: Due, Education has been provided regarding the importance of this vaccine. Advised may receive this vaccine at local pharmacy or Health Dept. Aware to provide a copy of the vaccination record if obtained from local pharmacy or Health Dept. Verbalized acceptance and understanding.  Flu Vaccine status: Declined, Education has been provided regarding the importance of this vaccine but patient still declined. Advised may receive this vaccine at local pharmacy or Health Dept. Aware to provide a copy of the vaccination record if obtained from local pharmacy or Health Dept. Verbalized acceptance and understanding.  Pneumococcal vaccine status: Up to date  Covid-19 vaccine status: Declined, Education has been provided regarding the importance of this vaccine but patient still declined. Advised may receive this vaccine at local pharmacy or Health Dept.or vaccine clinic. Aware to provide a copy of the vaccination record if obtained from local pharmacy or Health Dept. Verbalized acceptance and understanding.  Qualifies for Shingles Vaccine? Yes   Zostavax completed No    Shingrix Completed?: No.    Education has been provided regarding the importance of this vaccine. Patient has been advised to call insurance company to determine out of pocket expense if they have not yet received this vaccine. Advised may also receive vaccine at local pharmacy or Health Dept. Verbalized acceptance and understanding.  Screening Tests Health Maintenance  Topic Date Due   COVID-19 Vaccine (1) Never done   DTaP/Tdap/Td (1 - Tdap) Never done   Zoster Vaccines- Shingrix (1 of 2) Never done   INFLUENZA VACCINE  04/25/2022 (Originally 08/25/2021)   Medicare Annual Wellness (AWV)  02/20/2023   Pneumonia Vaccine 74+ Years old  Completed   HPV VACCINES  Aged Out   DEXA SCAN  Discontinued    Health Maintenance  Health Maintenance Due  Topic Date Due   COVID-19 Vaccine (1) Never done   DTaP/Tdap/Td (1 - Tdap) Never done   Zoster Vaccines- Shingrix (1 of 2) Never done    Colorectal cancer screening: No longer required.   Lung Cancer Screening: (Low Dose CT Chest recommended if Age 26-80 years, 30 pack-year currently smoking OR have quit w/in 15years.) does not qualify.   Lung Cancer Screening Referral: n/a  Additional Screening:  Hepatitis C Screening: does not qualify;   Vision Screening: Recommended annual ophthalmology exams for early detection of glaucoma and other disorders of the eye. Is the patient up to date with their annual eye exam?  Yes  Who is the provider or what is the name of the office in which the patient attends annual eye exams? Dr.hecker If pt is not established with a provider, would they like to be referred to a provider to establish care? No .   Dental Screening: Recommended annual dental exams for proper oral hygiene  Community Resource Referral / Chronic Care Management: CRR required this visit?  No   CCM required this visit?  No      Plan:     I have personally reviewed and noted the following in the patient's chart:   Medical and  social history Use of alcohol, tobacco or illicit drugs  Current medications and supplements including opioid prescriptions. Patient  is not currently taking opioid prescriptions. Functional ability and status Nutritional status Physical activity Advanced directives List of other physicians Hospitalizations, surgeries, and ER visits in previous 12 months Vitals Screenings to include cognitive, depression, and falls Referrals and appointments  In addition, I have reviewed and discussed with patient certain preventive protocols, quality metrics, and best practice recommendations. A written personalized care plan for preventive services as well as general preventive health recommendations were provided to patient.     Daphane Shepherd, LPN   1/58/6825   Nurse Notes: Due TDAp Vaccine

## 2022-02-22 ENCOUNTER — Ambulatory Visit (INDEPENDENT_AMBULATORY_CARE_PROVIDER_SITE_OTHER): Payer: Medicare Other | Admitting: Family Medicine

## 2022-02-22 ENCOUNTER — Encounter: Payer: Self-pay | Admitting: Family Medicine

## 2022-02-22 VITALS — BP 133/69 | HR 88 | Temp 96.7°F | Ht 72.0 in | Wt 213.2 lb

## 2022-02-22 DIAGNOSIS — C61 Malignant neoplasm of prostate: Secondary | ICD-10-CM | POA: Diagnosis not present

## 2022-02-22 DIAGNOSIS — E039 Hypothyroidism, unspecified: Secondary | ICD-10-CM | POA: Diagnosis not present

## 2022-02-22 DIAGNOSIS — I10 Essential (primary) hypertension: Secondary | ICD-10-CM

## 2022-02-22 DIAGNOSIS — E782 Mixed hyperlipidemia: Secondary | ICD-10-CM | POA: Diagnosis not present

## 2022-02-22 LAB — LIPID PANEL

## 2022-02-22 MED ORDER — AZELASTINE HCL 0.1 % NA SOLN: 2.0000 | Freq: Two times a day (BID) | NASAL | 12 refills | Status: DC

## 2022-02-22 MED ORDER — LEVOTHYROXINE SODIUM 88 MCG PO TABS: 88.0000 ug | ORAL_TABLET | Freq: Every day | ORAL | 2 refills | Status: DC

## 2022-02-22 NOTE — Progress Notes (Signed)
Subjective:  Patient ID: [name], male    DOB: 02/05/1932  Age: 89 y.o. MRN: [id number]  CC: Medical Management of Chronic Issues   HPI [name] presents for follow-up of elevated cholesterol. Doing well without complaints on current medication. Denies side effects of statin including myalgia and arthralgia and nausea. Also in today for liver function testing. Currently no chest pain, shortness of breath or other cardiovascular related symptoms noted.   follow-up on  thyroid. The patient has a history of hypothyroidism for many years. It has been stable recently. Pt. denies any change in  voice, loss of hair, heat or cold intolerance. Energy level has been adequate to good. Patient denies constipation and diarrhea. No myxedema. Medication is as noted below. Verified that pt is taking it daily on an empty stomach. Well tolerated.   presents for  follow-up of hypertension. Patient has no history of headache chest pain or shortness of breath or recent cough. Patient also denies symptoms of TIA such as focal numbness or weakness. Patient denies side effects from medication. States taking it regularly.  Has only taken 10 of the 60 hydrocodone given in Nov. Scrip. Sleeping in a lift chair recliner since he can't transfer out of bed.   Wants PSA and testosterone levls to send to [name].  History [name] has a past medical history of Aphasia, Arthritis, BNC (bladder neck contracture), BPPV (benign paroxysmal positional vertigo), Cataract, Diverticulosis, H/O diarrhea (SECONDARY TO RADIATION THERAPY --  INTERMITANT  DIARRHEA), Hematuria, History of hemorrhagic cystitis, History of prostate cancer (CURRENTLY  ELEVATED PSA--  LUPRON INJECTIONS), Hyperlipidemia, Hypertension, Hypothyroidism, Radiation cystitis, Radiation proctitis, Urinary incontinence, Urinary retention, and Vitamin D deficiency.   He has a past surgical history that includes Prostatectomy (1997); Colonoscopy w/  polypectomy (2005; 08/11/2010); CYSTO/ FULGERATION OF BLEEDERS/ RESECTION BLADDER NECK CONTRACTURE (07-06-2004); Cataract extraction w/ intraocular lens implant (Right); Lumbar disc surgery (1996); Transurethral resection of bladder neck (N/A, 06/23/2012); Transurethral resection of bladder tumor with gyrus (turbt-gyrus) (N/A, 06/01/2013); Eye surgery; Spine surgery; and Knee arthroscopy (Right).   His family history includes Alzheimer's disease in his brother; Appendicitis (age of onset: 20) in his sister; CVA (age of onset: 64) in his mother; Cancer in his sister and sister; Congestive Heart Failure in his mother; Early death in his sister; Hip fracture in his brother; Kidney disease in his brother and sister; Obesity in his sister; Pancreatitis in his father; Pneumonia in his father; Prostate cancer in his brother.He reports that he quit smoking about 59 years ago. His smoking use included cigarettes. He has been exposed to tobacco smoke. He quit smokeless tobacco use about 59 years ago.  His smokeless tobacco use included chew. He reports that he does not drink alcohol and does not use drugs.  Current Outpatient Medications on File Prior to Visit  Medication Sig Dispense Refill   acetaminophen (TYLENOL) 500 MG tablet Take 2 tablets (1,000 mg total) by mouth 3 (three) times daily. 100 tablet PRN   amLODipine (NORVASC) 10 MG tablet Take 1 tablet (10 mg total) by mouth daily. 90 tablet 3   aspirin 325 MG tablet Take 1 tablet (325 mg total) by mouth daily.     atorvastatin (LIPITOR) 40 MG tablet Take 1 tablet (40 mg total) by mouth daily. 90 tablet 3   Cholecalciferol (VITAMIN D3) 5000 UNITS CAPS Take 1 capsule by mouth daily.     Elastic Bandages & Supports (V-2 HIGH COMPRESSION HOSE) MISC Wear daily when up walking  around 1 each 0   enzalutamide (XTANDI) 40 MG tablet Take 160 mg by mouth daily.     FLAXSEED, LINSEED, PO Take 30 mLs by mouth every morning. GRINDS FLAX SEEDS AND PUTS IN 1/4 CUP OF WATER      GARLIC PO Take 2 capsules by mouth daily.     HYDROcodone-acetaminophen (NORCO) 5-325 MG tablet Take 1 tablet by mouth 2 (two) times daily after a meal. Take for moderate to severe pain 60 tablet 0   Leuprolide Acetate, 6 Month, (LUPRON) 45 MG injection Inject 45 mg into the muscle every 6 (six) months.     lisinopril (ZESTRIL) 40 MG tablet Take 1 tablet (40 mg total) by mouth daily. 90 tablet 3   loratadine (CLARITIN) 10 MG tablet Take 10 mg by mouth daily.     Magnesium 300 MG CAPS Take 1 capsule by mouth daily.     Multiple Vitamins-Minerals (CENTRUM SILVER ULTRA MENS PO) Take 1 tablet by mouth daily.     Probiotic Product (PROBIOTIC DAILY PO) Take 1 capsule by mouth daily.     No current facility-administered medications on file prior to visit.    ROS Review of Systems  Constitutional:  Negative for fever.  HENT:  Positive for ear pain (left) and hearing loss (overall hearing not good for years).   Respiratory:  Negative for shortness of breath.   Cardiovascular:  Negative for chest pain.  Musculoskeletal:  Positive for gait problem (uses cane or walker). Negative for arthralgias.  Skin:  Negative for rash.    Objective:  BP 133/69   Pulse 88   Temp (!) 96.7 F (35.9 C)   Ht 6' (1.829 m)   Wt 213 lb 3.2 oz (96.7 kg)   SpO2 97%   BMI 28.92 kg/m   BP Readings from Last 3 Encounters:  02/22/22 133/69  12/16/21 (!) 153/80  11/03/21 (!) 161/74    Wt Readings from Last 3 Encounters:  02/22/22 213 lb 3.2 oz (96.7 kg)  02/19/22 225 lb (102.1 kg)  12/16/21 220 lb (99.8 kg)     Physical Exam Vitals reviewed.  Constitutional:      Appearance: He is well-developed.  HENT:     Head: Normocephalic and atraumatic.     Right Ear: External ear normal.     Left Ear: External ear normal.     Mouth/Throat:     Pharynx: No oropharyngeal exudate or posterior oropharyngeal erythema.  Eyes:     Pupils: Pupils are equal, round, and reactive to light.  Cardiovascular:     Rate  and Rhythm: Normal rate and regular rhythm.     Heart sounds: No murmur heard. Pulmonary:     Effort: No respiratory distress.     Breath sounds: Normal breath sounds.  Musculoskeletal:     Cervical back: Normal range of motion and neck supple.  Neurological:     Mental Status: He is alert and oriented to person, place, and time.     Lab Results  Component Value Date   HGBA1C 5.5 04/05/2018   HGBA1C 5.5 04/04/2016    Lab Results  Component Value Date   WBC 8.1 10/27/2021   HGB 13.7 10/27/2021   HCT 41.7 10/27/2021   PLT 255 10/27/2021   GLUCOSE 101 (H) 10/27/2021   CHOL 134 08/19/2021   TRIG 127 08/19/2021   HDL 59 08/19/2021   LDLDIRECT 51 09/30/2016   LDLCALC 53 08/19/2021   ALT 11 10/27/2021   AST 16 10/27/2021  NA 136 10/27/2021   K 4.9 10/27/2021   CL 102 10/27/2021   CREATININE 0.91 10/27/2021   BUN 18 10/27/2021   CO2 25 10/27/2021   TSH 3.260 08/19/2021   PSA 0.015 12/22/2015   INR 1.01 04/03/2016   HGBA1C 5.5 04/05/2018    CT Chest Wo Contrast  Result Date: 10/29/2021 CLINICAL DATA:  89 year old male with history of non-small cell lung cancer and prostate cancer. Follow-up examination. * Tracking Code: BO * EXAM: CT CHEST WITHOUT CONTRAST TECHNIQUE: Multidetector CT imaging of the chest was performed following the standard protocol without IV contrast. RADIATION DOSE REDUCTION: This exam was performed according to the departmental dose-optimization program which includes automated exposure control, adjustment of the mA and/or kV according to patient size and/or use of iterative reconstruction technique. COMPARISON:  Chest CT 06/24/2021.  PET-CT 01/23/2021. FINDINGS: Cardiovascular: Heart size is normal. There is no significant pericardial fluid, thickening or pericardial calcification. There is aortic atherosclerosis, as well as atherosclerosis of the great vessels of the mediastinum and the coronary arteries, including calcified atherosclerotic plaque in the  left main, left anterior descending, left circumflex and right coronary arteries. Mediastinum/Nodes: Partially calcified AP window lymph node measuring 1.5 cm in short axis, decreased in size compared to the prior examination. Subcarinal lymph nodes are prominent and borderline enlarged measuring up to 12 mm in short axis. No other definite lymphadenopathy is confidently identified on today's noncontrast CT examination. Small hiatal hernia. No axillary lymphadenopathy. Lungs/Pleura: Again noted is an area of nodular architectural distortion in the medial aspect of the left upper lobe at the site of the treated lesion, best appreciated on axial image 55 of series 4) where this currently measures approximately 2.3 x 1.2 cm, with increasing surrounding septal thickening and regional architectural distortion and volume loss, most compatible with an area of evolving postradiation mass-like fibrosis. No other definite suspicious appearing pulmonary nodules or masses are noted. No acute consolidative airspace disease. No pleural effusions. Scattered areas of linear scarring and a few scattered areas of mild cylindrical bronchiectasis are noted throughout the lung bases bilaterally. Upper Abdomen: Aortic atherosclerosis. Musculoskeletal: There are no aggressive appearing lytic or blastic lesions noted in the visualized portions of the skeleton. IMPRESSION: 1. Expected postprocedural changes of evolving postradiation mass-like fibrosis in the left upper lobe with no definitive findings to suggest locally recurrent disease or definite metastatic disease in the thorax. Regression of lymphadenopathy also noted in the mediastinum, as above. 2. Aortic atherosclerosis, in addition to left main and three-vessel coronary artery disease. Aortic Atherosclerosis (ICD10-I70.0). Electronically Signed   By: Vinnie Langton M.D.   On: 10/29/2021 07:51    Assessment & Plan:   [name] was seen today for medical management of chronic  issues.  Diagnoses and all orders for this visit:  Essential hypertension -     CBC with Differential/Platelet -     CMP14+EGFR  Acquired hypothyroidism -     TSH + free T4  Mixed hyperlipidemia -     Lipid panel  Prostate cancer (HCC) -     Testosterone -     PSA, total and free  Other orders -     levothyroxine (SYNTHROID) 88 MCG tablet; Take 1 tablet (88 mcg total) by mouth daily before breakfast. -     azelastine (ASTELIN) 0.1 % nasal spray; Place 2 sprays into both nostrils 2 (two) times daily. Use in each nostril as directed   I am having [name] start on azelastine. I am  also having him maintain his (FLAXSEED, LINSEED, PO), Leuprolide Acetate (6 Month), Vitamin D3, Magnesium, Probiotic Product (PROBIOTIC DAILY PO), aspirin, V-2 High Compression Hose, GARLIC PO, Multiple Vitamins-Minerals (CENTRUM SILVER ULTRA MENS PO), loratadine, atorvastatin, lisinopril, amLODipine, enzalutamide, acetaminophen, HYDROcodone-acetaminophen, and levothyroxine.  Meds ordered this encounter  Medications   levothyroxine (SYNTHROID) 88 MCG tablet    Sig: Take 1 tablet (88 mcg total) by mouth daily before breakfast.    Dispense:  90 tablet    Refill:  2   azelastine (ASTELIN) 0.1 % nasal spray    Sig: Place 2 sprays into both nostrils 2 (two) times daily. Use in each nostril as directed    Dispense:  30 mL    Refill:  12     Follow-up: Return in about 6 months (around 08/23/2022).  Claretta Fraise, M.D.

## 2022-02-23 ENCOUNTER — Other Ambulatory Visit: Payer: Self-pay | Admitting: Family Medicine

## 2022-02-23 LAB — PSA, TOTAL AND FREE
PSA, Free: <0.02 ng/mL
Prostate Specific Ag, Serum: <0.1 ng/mL (ref 0.0–4.0)

## 2022-02-23 LAB — TSH+FREE T4
Free T4: 1.46 ng/dL (ref 0.82–1.77)
TSH: 5.5 u[IU]/mL — ABNORMAL HIGH (ref 0.450–4.500)

## 2022-02-23 LAB — CMP14+EGFR
ALT: 9 IU/L (ref 0–44)
AST: 16 IU/L (ref 0–40)
Albumin/Globulin Ratio: 1.5 (ref 1.2–2.2)
Albumin: 3.8 g/dL (ref 3.7–4.7)
Alkaline Phosphatase: 90 IU/L (ref 44–121)
BUN/Creatinine Ratio: 18 (ref 10–24)
BUN: 15 mg/dL (ref 8–27)
Bilirubin Total: 0.6 mg/dL (ref 0.0–1.2)
CO2: 19 mmol/L — ABNORMAL LOW (ref 20–29)
Calcium: 10 mg/dL (ref 8.6–10.2)
Chloride: 98 mmol/L (ref 96–106)
Creatinine, Ser: 0.83 mg/dL (ref 0.76–1.27)
Globulin, Total: 2.6 g/dL (ref 1.5–4.5)
Glucose: 112 mg/dL — ABNORMAL HIGH (ref 70–99)
Potassium: 4.9 mmol/L (ref 3.5–5.2)
Sodium: 135 mmol/L (ref 134–144)
Total Protein: 6.4 g/dL (ref 6.0–8.5)
eGFR: 85 mL/min/{1.73_m2} (ref >59)

## 2022-02-23 LAB — CBC WITH DIFFERENTIAL/PLATELET
Basophils Absolute: 0.1 10*3/uL (ref 0.0–0.2)
Basos: 1 %
EOS (ABSOLUTE): 0.6 10*3/uL — ABNORMAL HIGH (ref 0.0–0.4)
Eos: 9 %
Hematocrit: 39.8 % (ref 37.5–51.0)
Hemoglobin: 12.9 g/dL — ABNORMAL LOW (ref 13.0–17.7)
Immature Grans (Abs): 0 10*3/uL (ref 0.0–0.1)
Immature Granulocytes: 1 %
Lymphocytes Absolute: 1.2 10*3/uL (ref 0.7–3.1)
Lymphs: 19 %
MCH: 30.6 pg (ref 26.6–33.0)
MCHC: 32.4 g/dL (ref 31.5–35.7)
MCV: 94 fL (ref 79–97)
Monocytes Absolute: 0.7 10*3/uL (ref 0.1–0.9)
Monocytes: 10 %
Neutrophils Absolute: 4 10*3/uL (ref 1.4–7.0)
Neutrophils: 60 %
Platelets: 288 10*3/uL (ref 150–450)
RBC: 4.22 x10E6/uL (ref 4.14–5.80)
RDW: 12.8 % (ref 11.6–15.4)
WBC: 6.5 10*3/uL (ref 3.4–10.8)

## 2022-02-23 LAB — LIPID PANEL
Chol/HDL Ratio: 2.1 ratio (ref 0.0–5.0)
Cholesterol, Total: 130 mg/dL (ref 100–199)
HDL: 63 mg/dL (ref >39)
LDL Chol Calc (NIH): 50 mg/dL (ref 0–99)
Triglycerides: 87 mg/dL (ref 0–149)
VLDL Cholesterol Cal: 17 mg/dL (ref 5–40)

## 2022-02-23 LAB — TESTOSTERONE: Testosterone: 18 ng/dL — ABNORMAL LOW (ref 264–916)

## 2022-02-23 MED ORDER — LEVOTHYROXINE SODIUM 100 MCG PO TABS: 100.0000 ug | ORAL_TABLET | Freq: Every day | ORAL | 1 refills | Status: DC

## 2022-02-24 ENCOUNTER — Other Ambulatory Visit: Payer: Self-pay | Admitting: *Deleted

## 2022-02-24 DIAGNOSIS — E039 Hypothyroidism, unspecified: Secondary | ICD-10-CM

## 2022-03-12 ENCOUNTER — Other Ambulatory Visit: Payer: Self-pay | Admitting: Family Medicine

## 2022-03-23 ENCOUNTER — Telehealth: Payer: Self-pay | Admitting: Family Medicine

## 2022-03-23 NOTE — Telephone Encounter (Signed)
Patients daughter called requesting to speak with nurse regarding pts last thyroid result. Says his thyroid result came back high last time but Dr Livia Snellen wanted to increase the dosage of his thyroid medicine instead of decrease. Wants to make sure this is correct?

## 2022-03-23 NOTE — Telephone Encounter (Signed)
That is correct. The TSH goes up when the thyroid hormone is down, and down when the thyroid hormone is up. As a result. The HIGHER  the TSH the more levothyroxine you need.

## 2022-03-23 NOTE — Telephone Encounter (Signed)
DAUGHTER AWARE

## 2022-03-24 DIAGNOSIS — C61 Malignant neoplasm of prostate: Secondary | ICD-10-CM | POA: Diagnosis not present

## 2022-03-29 ENCOUNTER — Other Ambulatory Visit: Payer: Self-pay | Admitting: *Deleted

## 2022-03-29 DIAGNOSIS — C3490 Malignant neoplasm of unspecified part of unspecified bronchus or lung: Secondary | ICD-10-CM

## 2022-04-13 ENCOUNTER — Other Ambulatory Visit: Payer: Medicare Other

## 2022-04-13 DIAGNOSIS — E039 Hypothyroidism, unspecified: Secondary | ICD-10-CM

## 2022-04-14 LAB — TSH+FREE T4
Free T4: 1.58 ng/dL (ref 0.82–1.77)
TSH: 4.58 u[IU]/mL — ABNORMAL HIGH (ref 0.450–4.500)

## 2022-04-15 ENCOUNTER — Encounter: Payer: Self-pay | Admitting: Family Medicine

## 2022-04-15 ENCOUNTER — Ambulatory Visit (INDEPENDENT_AMBULATORY_CARE_PROVIDER_SITE_OTHER): Payer: Medicare Other | Admitting: Family Medicine

## 2022-04-15 VITALS — BP 127/68 | HR 102 | Temp 96.8°F | Ht 72.0 in | Wt 213.6 lb

## 2022-04-15 DIAGNOSIS — E039 Hypothyroidism, unspecified: Secondary | ICD-10-CM

## 2022-04-15 MED ORDER — LEVOTHYROXINE SODIUM 125 MCG PO TABS: 125.0000 ug | ORAL_TABLET | Freq: Every day | ORAL | 1 refills | Status: DC

## 2022-04-15 NOTE — Progress Notes (Signed)
Subjective:  Patient ID: [name], male    DOB: [birth year]  Age: 89 y.o. MRN: [id number]  CC: Hypothyroidism   HPI [name] presents for  follow-up on  thyroid. The patient has a history of hypothyroidism for many years.Pt. denies any change in  voice, loss of hair, heat or cold intolerance. Energy level has been a bit better. Recent ly had thyroid dose adjusted upward. Here today for further adjustment. He had lawork done in prep  Patient denies constipation and diarrhea. No myxedema. Medication is as noted below. Verified that pt is taking it daily on an empty stomach. Well tolerated.      04/15/2022    2:49 PM 04/15/2022    2:43 PM 02/22/2022    7:58 AM  Depression screen PHQ 2/9  Decreased Interest 3 0 0  Down, Depressed, Hopeless 0 0 0  PHQ - 2 Score 3 0 0  Altered sleeping 0    Tired, decreased energy 0    Change in appetite 0    Feeling bad or failure about yourself  1    Trouble concentrating 0    Moving slowly or fidgety/restless 1    Suicidal thoughts 0    PHQ-9 Score 5    Difficult doing work/chores Somewhat difficult      History [name] has a past medical history of Aphasia, Arthritis, BNC (bladder neck contracture), BPPV (benign paroxysmal positional vertigo), Cataract, Diverticulosis, H/O diarrhea (SECONDARY TO RADIATION THERAPY --  INTERMITANT  DIARRHEA), Hematuria, History of hemorrhagic cystitis, History of prostate cancer (CURRENTLY  ELEVATED PSA--  LUPRON INJECTIONS), Hyperlipidemia, Hypertension, Hypothyroidism, Radiation cystitis, Radiation proctitis, Urinary incontinence, Urinary retention, and Vitamin D deficiency.   He has a past surgical history that includes Prostatectomy (1997); Colonoscopy w/ polypectomy (2005; 08/11/2010); CYSTO/ FULGERATION OF BLEEDERS/ RESECTION BLADDER NECK CONTRACTURE (07-06-2004); Cataract extraction w/ intraocular lens implant (Right); Lumbar disc surgery (1996); Transurethral resection of bladder neck (N/A, 06/23/2012);  Transurethral resection of bladder tumor with gyrus (turbt-gyrus) (N/A, 06/01/2013); Eye surgery; Spine surgery; and Knee arthroscopy (Right).   His family history includes Alzheimer's disease in his brother; Appendicitis (age of onset: 67) in his sister; CVA (age of onset: 15) in his mother; Cancer in his sister and sister; Congestive Heart Failure in his mother; Early death in his sister; Hip fracture in his brother; Kidney disease in his brother and sister; Obesity in his sister; Pancreatitis in his father; Pneumonia in his father; Prostate cancer in his brother.He reports that he quit smoking about 59 years ago. His smoking use included cigarettes. He has been exposed to tobacco smoke. He quit smokeless tobacco use about 59 years ago.  His smokeless tobacco use included chew. He reports that he does not drink alcohol and does not use drugs.    ROS Review of Systems  Constitutional:  Negative for fever.  Respiratory:  Negative for shortness of breath.   Cardiovascular:  Negative for chest pain.  Musculoskeletal:  Negative for arthralgias.  Skin:  Negative for rash.    Objective:  BP 127/68   Pulse (!) 102   Temp (!) 96.8 F (36 C)   Ht 6' (1.829 m)   Wt 213 lb 9.6 oz (96.9 kg)   SpO2 95%   BMI 28.97 kg/m   BP Readings from Last 3 Encounters:  04/15/22 127/68  02/22/22 133/69  12/16/21 (!) 153/80    Wt Readings from Last 3 Encounters:  04/15/22 213 lb 9.6 oz (96.9 kg)  02/22/22 213 lb  3.2 oz (96.7 kg)  02/19/22 225 lb (102.1 kg)     Physical Exam Vitals reviewed.  Constitutional:      Appearance: He is well-developed.  HENT:     Head: Normocephalic and atraumatic.     Right Ear: External ear normal.     Left Ear: External ear normal.     Mouth/Throat:     Pharynx: No oropharyngeal exudate or posterior oropharyngeal erythema.  Eyes:     Pupils: Pupils are equal, round, and reactive to light.  Cardiovascular:     Rate and Rhythm: Normal rate and regular rhythm.      Heart sounds: No murmur heard. Pulmonary:     Effort: No respiratory distress.     Breath sounds: Normal breath sounds.  Musculoskeletal:     Cervical back: Normal range of motion and neck supple.  Neurological:     Mental Status: He is alert and oriented to person, place, and time.    Results for orders placed or performed in visit on 04/13/22  TSH + free T4  Result Value Ref Range   TSH 4.580 (H) 0.450 - 4.500 uIU/mL   Free T4 1.58 0.82 - 1.77 ng/dL      Assessment & Plan:   [name] was seen today for hypothyroidism.  Diagnoses and all orders for this visit:  Acquired hypothyroidism  Other orders -     levothyroxine (SYNTHROID) 125 MCG tablet; Take 1 tablet (125 mcg total) by mouth daily before breakfast.       I have changed [name]'s levothyroxine. I am also having him maintain his (FLAXSEED, LINSEED, PO), Leuprolide Acetate (6 Month), Vitamin D3, Magnesium, Probiotic Product (PROBIOTIC DAILY PO), aspirin, V-2 High Compression Hose, GARLIC PO, Multiple Vitamins-Minerals (CENTRUM SILVER ULTRA MENS PO), loratadine, atorvastatin, lisinopril, amLODipine, enzalutamide, acetaminophen, HYDROcodone-acetaminophen, and azelastine.  Allergies as of 04/15/2022       Reactions   Caffeine Palpitations   Codeine Palpitations        Medication List        Accurate as of April 15, 2022 11:59 PM. If you have any questions, ask your nurse or doctor.          acetaminophen 500 MG tablet Commonly known as: TYLENOL Take 2 tablets (1,000 mg total) by mouth 3 (three) times daily.   amLODipine 10 MG tablet Commonly known as: NORVASC Take 1 tablet (10 mg total) by mouth daily.   aspirin 325 MG tablet Take 1 tablet (325 mg total) by mouth daily.   atorvastatin 40 MG tablet Commonly known as: LIPITOR Take 1 tablet (40 mg total) by mouth daily.   azelastine 0.1 % nasal spray Commonly known as: ASTELIN Place 2 sprays into both nostrils 2 (two) times daily. Use in  each nostril as directed   CENTRUM SILVER ULTRA MENS PO Take 1 tablet by mouth daily.   FLAXSEED (LINSEED) PO Take 30 mLs by mouth every morning. GRINDS FLAX SEEDS AND PUTS IN 1/4 CUP OF WATER   GARLIC PO Take 2 capsules by mouth daily.   HYDROcodone-acetaminophen 5-325 MG tablet Commonly known as: Norco Take 1 tablet by mouth 2 (two) times daily after a meal. Take for moderate to severe pain   Leuprolide Acetate (6 Month) 45 MG injection Commonly known as: LUPRON Inject 45 mg into the muscle every 6 (six) months.   levothyroxine 125 MCG tablet Commonly known as: SYNTHROID Take 1 tablet (125 mcg total) by mouth daily before breakfast. What changed:  medication strength  how much to take Changed by: Claretta Fraise, MD   lisinopril 40 MG tablet Commonly known as: ZESTRIL Take 1 tablet (40 mg total) by mouth daily.   loratadine 10 MG tablet Commonly known as: CLARITIN Take 10 mg by mouth daily.   Magnesium 300 MG Caps Take 1 capsule by mouth daily.   PROBIOTIC DAILY PO Take 1 capsule by mouth daily.   V-2 High Compression Hose Misc Wear daily when up walking around   Vitamin D3 125 MCG (5000 UT) capsule Generic drug: Cholecalciferol Take 1 capsule by mouth daily.   Xtandi 40 MG tablet Generic drug: enzalutamide Take 160 mg by mouth daily.         Follow-up: Return in about 2 months (around 06/15/2022).  Claretta Fraise, M.D.

## 2022-04-17 ENCOUNTER — Encounter: Payer: Self-pay | Admitting: Family Medicine

## 2022-04-27 DIAGNOSIS — L84 Corns and callosities: Secondary | ICD-10-CM | POA: Diagnosis not present

## 2022-04-27 DIAGNOSIS — I70203 Unspecified atherosclerosis of native arteries of extremities, bilateral legs: Secondary | ICD-10-CM | POA: Diagnosis not present

## 2022-04-27 DIAGNOSIS — B351 Tinea unguium: Secondary | ICD-10-CM | POA: Diagnosis not present

## 2022-04-27 DIAGNOSIS — M79676 Pain in unspecified toe(s): Secondary | ICD-10-CM | POA: Diagnosis not present

## 2022-04-28 ENCOUNTER — Other Ambulatory Visit: Payer: Self-pay | Admitting: *Deleted

## 2022-04-28 DIAGNOSIS — C3490 Malignant neoplasm of unspecified part of unspecified bronchus or lung: Secondary | ICD-10-CM

## 2022-05-03 ENCOUNTER — Ambulatory Visit (HOSPITAL_BASED_OUTPATIENT_CLINIC_OR_DEPARTMENT_OTHER)
Admission: RE | Admit: 2022-05-03 | Discharge: 2022-05-03 | Disposition: A | Discharge status: Home / Self Care | Payer: Medicare Other | Source: Ambulatory Visit | Attending: Internal Medicine | Admitting: Internal Medicine

## 2022-05-03 ENCOUNTER — Inpatient Hospital Stay: Payer: Medicare Other | Attending: Oncology

## 2022-05-03 DIAGNOSIS — Z923 Personal history of irradiation: Secondary | ICD-10-CM | POA: Diagnosis not present

## 2022-05-03 DIAGNOSIS — E039 Hypothyroidism, unspecified: Secondary | ICD-10-CM | POA: Diagnosis not present

## 2022-05-03 DIAGNOSIS — Z8546 Personal history of malignant neoplasm of prostate: Secondary | ICD-10-CM | POA: Diagnosis not present

## 2022-05-03 DIAGNOSIS — R32 Unspecified urinary incontinence: Secondary | ICD-10-CM | POA: Insufficient documentation

## 2022-05-03 DIAGNOSIS — G8929 Other chronic pain: Secondary | ICD-10-CM | POA: Insufficient documentation

## 2022-05-03 DIAGNOSIS — Z08 Encounter for follow-up examination after completed treatment for malignant neoplasm: Secondary | ICD-10-CM | POA: Insufficient documentation

## 2022-05-03 DIAGNOSIS — M549 Dorsalgia, unspecified: Secondary | ICD-10-CM | POA: Diagnosis not present

## 2022-05-03 DIAGNOSIS — C3490 Malignant neoplasm of unspecified part of unspecified bronchus or lung: Secondary | ICD-10-CM | POA: Diagnosis not present

## 2022-05-03 DIAGNOSIS — Z8673 Personal history of transient ischemic attack (TIA), and cerebral infarction without residual deficits: Secondary | ICD-10-CM | POA: Insufficient documentation

## 2022-05-03 DIAGNOSIS — R918 Other nonspecific abnormal finding of lung field: Secondary | ICD-10-CM | POA: Insufficient documentation

## 2022-05-03 DIAGNOSIS — H8110 Benign paroxysmal vertigo, unspecified ear: Secondary | ICD-10-CM | POA: Diagnosis not present

## 2022-05-03 DIAGNOSIS — J432 Centrilobular emphysema: Secondary | ICD-10-CM | POA: Diagnosis not present

## 2022-05-03 LAB — CBC WITH DIFFERENTIAL (CANCER CENTER ONLY)
Abs Immature Granulocytes: 0.04 10*3/uL (ref 0.00–0.07)
Basophils Absolute: 0 10*3/uL (ref 0.0–0.1)
Basophils Relative: 1 %
Eosinophils Absolute: 0.4 10*3/uL (ref 0.0–0.5)
Eosinophils Relative: 6 %
HCT: 40.7 % (ref 39.0–52.0)
Hemoglobin: 13.6 g/dL (ref 13.0–17.0)
Immature Granulocytes: 1 %
Lymphocytes Relative: 21 %
Lymphs Abs: 1.4 10*3/uL (ref 0.7–4.0)
MCH: 31.5 pg (ref 26.0–34.0)
MCHC: 33.4 g/dL (ref 30.0–36.0)
MCV: 94.2 fL (ref 80.0–100.0)
Monocytes Absolute: 0.7 10*3/uL (ref 0.1–1.0)
Monocytes Relative: 11 %
Neutro Abs: 4.2 10*3/uL (ref 1.7–7.7)
Neutrophils Relative %: 60 %
Platelet Count: 267 10*3/uL (ref 150–400)
RBC: 4.32 MIL/uL (ref 4.22–5.81)
RDW: 13.9 % (ref 11.5–15.5)
WBC Count: 6.8 10*3/uL (ref 4.0–10.5)
nRBC: 0 % (ref 0.0–0.2)

## 2022-05-03 LAB — CMP (CANCER CENTER ONLY)
ALT: 8 U/L (ref 0–44)
AST: 13 U/L — ABNORMAL LOW (ref 15–41)
Albumin: 4.1 g/dL (ref 3.5–5.0)
Alkaline Phosphatase: 73 U/L (ref 38–126)
Anion gap: 7 (ref 5–15)
BUN: 16 mg/dL (ref 8–23)
CO2: 25 mmol/L (ref 22–32)
Calcium: 10.1 mg/dL (ref 8.9–10.3)
Chloride: 101 mmol/L (ref 98–111)
Creatinine: 0.95 mg/dL (ref 0.61–1.24)
GFR, Estimated: >60 mL/min (ref >60)
Glucose, Bld: 105 mg/dL — ABNORMAL HIGH (ref 70–99)
Potassium: 4.2 mmol/L (ref 3.5–5.1)
Sodium: 133 mmol/L — ABNORMAL LOW (ref 135–145)
Total Bilirubin: 0.5 mg/dL (ref 0.3–1.2)
Total Protein: 6.6 g/dL (ref 6.5–8.1)

## 2022-05-03 MED ORDER — IOHEXOL 300 MG/ML  SOLN
100.0000 mL | Freq: Once | INTRAMUSCULAR | Status: AC | PRN
  Administered 2022-05-03: 75 mL via INTRAVENOUS

## 2022-05-10 ENCOUNTER — Inpatient Hospital Stay (HOSPITAL_BASED_OUTPATIENT_CLINIC_OR_DEPARTMENT_OTHER): Payer: Medicare Other | Admitting: Oncology

## 2022-05-10 VITALS — BP 152/68 | HR 90 | Temp 98.1°F | Resp 18 | Ht 72.0 in | Wt 214.0 lb

## 2022-05-10 DIAGNOSIS — G8929 Other chronic pain: Secondary | ICD-10-CM | POA: Diagnosis not present

## 2022-05-10 DIAGNOSIS — M549 Dorsalgia, unspecified: Secondary | ICD-10-CM | POA: Diagnosis not present

## 2022-05-10 DIAGNOSIS — Z8546 Personal history of malignant neoplasm of prostate: Secondary | ICD-10-CM | POA: Diagnosis not present

## 2022-05-10 DIAGNOSIS — R918 Other nonspecific abnormal finding of lung field: Secondary | ICD-10-CM

## 2022-05-10 DIAGNOSIS — E039 Hypothyroidism, unspecified: Secondary | ICD-10-CM | POA: Diagnosis not present

## 2022-05-10 DIAGNOSIS — Z08 Encounter for follow-up examination after completed treatment for malignant neoplasm: Secondary | ICD-10-CM | POA: Diagnosis not present

## 2022-05-10 NOTE — Progress Notes (Signed)
[facility] OFFICE PROGRESS NOTE   Diagnosis: Lung mass, mediastinal lymph nodes  INTERVAL HISTORY:   Mr. Batch was evaluated for hoarseness in late 2022.  A CT chest 01/02/2021 revealed a 2.3 x 2.8 cm lobulated nodule in the medial left upper lobe with contact of the adjacent mediastinum.  An AP window nodal mass measured 2.5 x 3.4 cm.  There was suspicion for cirrhosis.  A PET scan on 01/23/2021 revealed a hypermetabolic left upper lobe nodule and hypermetabolic AP window/subcarinal lymphadenopathy.  There was 1 small focus of FDG activity above background in the liver that was indeterminate.  He was not a candidate for biopsy due to his medical condition.  He was referred to Dr. Mitzi Hansen for radiation in the setting of clinical stage III lung cancer.  He completed 37.5 Gray in 15 fractions.  He reports tolerating the radiation well.  He has been followed by Dr. Shirline Frees for surveillance imaging.  A CT chest 10/27/2021 revealed postradiation changes in the left upper lobe with no evidence of recurrent disease.  Regression of lymphadenopathy is noted in the mediastinum.  Mr Samek is here today with his wife and son.  He generally feels well.  Past medical history: History of benign positional vertigo Prostate cancer, status post prostatectomy and radiation in 1997, currently maintained on Lupron and enzalutamide Hyperlipidemia Hypertension History of radiation cystitis Urinary incontinence Benign positional vertigo History of a CVA in the remote past 9.   Vertebral compression fractures Past surgical history: Lumbar spine surgery Right knee surgery Prostatectomy   Family history: A paternal uncle had prostate cancer.  A brother had prostate cancer.  Social history: He lives with his wife and Mannam.  He is retired from the town of [employer].  He quit smoking cigarettes 6 years ago.  No alcohol use.  No risk factor for hepatitis.  No transfusion  history.  Review of systems: Positives-urinary incontinence, occasional nausea, "indigestion" after eating, right knee and low back pain-sleeps in a lift chair secondary to pain, short-term memory loss  Objective:  Vital signs in last 24 hours:  Blood pressure (!) 152/68, pulse 90, temperature 98.1 F (36.7 C), temperature source Oral, resp. rate 18, height 6' (1.829 m), weight 214 lb (97.1 kg), SpO2 99 %.    HEENT: Oropharynx without visible mass, neck without mass Lymphatics: No cervical, supraclavicular, axillary, or inguinal nodes Resp: Lungs clear bilaterally Cardio: Regular rate and rhythm GI: No hepatosplenomegaly, no mass, nontender Vascular: Varicosities and chronic stasis change of the lower legs Neuro: Alert and oriented, the motor exam appears intact in the upper and lower extremities bilaterally, ambulated to the exam table Skin: No rash Musculoskeletal: No spine tenderness  Lab Results:  Lab Results  Component Value Date   WBC 6.8 05/03/2022   HGB 13.6 05/03/2022   HCT 40.7 05/03/2022   MCV 94.2 05/03/2022   PLT 267 05/03/2022   NEUTROABS 4.2 05/03/2022    CMP  Lab Results  Component Value Date   NA 133 (L) 05/03/2022   K 4.2 05/03/2022   CL 101 05/03/2022   CO2 25 05/03/2022   GLUCOSE 105 (H) 05/03/2022   BUN 16 05/03/2022   CREATININE 0.95 05/03/2022   CALCIUM 10.1 05/03/2022   PROT 6.6 05/03/2022   ALBUMIN 4.1 05/03/2022   AST 13 (L) 05/03/2022   ALT 8 05/03/2022   ALKPHOS 73 05/03/2022   BILITOT 0.5 05/03/2022   GFRNONAA >60 05/03/2022   GFRAA 95 11/08/2019  Medications: I have reviewed the patient's current medications.   Assessment/Plan:  Left lung mass/mediastinal lymphadenopathy CT neck eleven 1722-1.9 cm left upper lobe nodule, medialization of the left vocal cord CT chest 01/03/2019 22-2.3 x 2.8 cm medial anterior left upper lobe nodule contacting the mediastinum, 2.5 x 3.4 cm AP window node PET 01/23/2021-hypermetabolic left  upper lobe nodule, hypermetabolic AP window and subcarinal lymphadenopathy, 1 small focus in the liver with FDG activity above background-indeterminate Radiation, 37.5 Wallace Cullens 03/09/2021-03/27/2021-15 fractions CT chest 06/24/2021-decrease in left upper lobe nodule, developing radiation fibrosis, decreased AP window lymph node conglomerate, unchanged subcarinal node CT chest 10/27/2021-postradiation fibrosis in the left upper lobe with no local disease recurrence, regression of lymphadenopathy with a partially calcified AP window node 1.5 cm, 12 mm subcarinal node CT chest 05/02/2021-left upper lobe radiation change, unchanged mediastinal lymph nodes  2.   Prostate cancer-prostatectomy and radiation 1997, followed by urology on Lupron/enzalutamide for an elevated PSA 3.  Chronic back pain 4.  Transurethral resection of lateral neck 2014, transurethral resection of bladder tumor 2015 5.  Hypothyroidism 6.  Benign positional vertigo 7.  History of a CVA 8.  Right knee arthroscopy 9.  Lumbar surgery 1996 10.  Urinary incontinence 11.  Postprandial "indigestion "  Disposition: Mr. Moriyama was treated with radiation to a left upper lung mass and mediastinal lymph nodes and in 2023.  The surveillance chest CT reveals no evidence of disease progression.  I reviewed the CT images with Mr.Klutts and his family.  He has multiple comorbid conditions.  There is no clinical evidence for progression of lung cancer or prostate cancer.  He is followed by urology for treatment of prostate cancer.  He will return for an office visit and surveillance chest CT in 6 months.  I am available to see him sooner for new symptoms.  Recommended he follow-up with Dr. Darlyn Read if he has persistent "indigestion "or develops dysphagia.  Approximately 45 minutes were spent with the patient today.  The majority the time was used for counseling and coordination of care.  Thornton Papas, MD  05/10/2022  11:37 AM

## 2022-05-10 NOTE — Progress Notes (Deleted)
  [facility] OFFICE PROGRESS NOTE   Diagnosis:   INTERVAL HISTORY:   ***  Objective:  Vital signs in last 24 hours:  Blood pressure (!) 152/68, pulse 90, temperature 98.1 F (36.7 C), temperature source Oral, resp. rate 18, height 6' (1.829 m), weight 214 lb (97.1 kg), SpO2 99 %.    HEENT: *** Lymphatics: *** Resp: *** Cardio: *** GI: *** Vascular: *** Neuro:***  Skin:***   Portacath/PICC-without erythema  Lab Results:  Lab Results  Component Value Date   WBC 6.8 05/03/2022   HGB 13.6 05/03/2022   HCT 40.7 05/03/2022   MCV 94.2 05/03/2022   PLT 267 05/03/2022   NEUTROABS 4.2 05/03/2022    CMP  Lab Results  Component Value Date   NA 133 (L) 05/03/2022   K 4.2 05/03/2022   CL 101 05/03/2022   CO2 25 05/03/2022   GLUCOSE 105 (H) 05/03/2022   BUN 16 05/03/2022   CREATININE 0.95 05/03/2022   CALCIUM 10.1 05/03/2022   PROT 6.6 05/03/2022   ALBUMIN 4.1 05/03/2022   AST 13 (L) 05/03/2022   ALT 8 05/03/2022   ALKPHOS 73 05/03/2022   BILITOT 0.5 05/03/2022   GFRNONAA >60 05/03/2022   GFRAA 95 11/08/2019    No results found for: "CEA1", "CEA", "CAN199", "CA125"  Lab Results  Component Value Date   INR 1.01 04/03/2016   LABPROT 13.3 04/03/2016    Imaging:  No results found.  Medications: I have reviewed the patient's current medications.   Assessment/Plan:    Disposition: ***  Thornton Papas, MD  05/10/2022  11:14 AM

## 2022-05-28 ENCOUNTER — Telehealth: Payer: Self-pay | Admitting: Family Medicine

## 2022-05-28 NOTE — Telephone Encounter (Signed)
Please add future lab order for patient. He is scheduled to come in for labs on 5/22. Wants his PSA, testosterone, and whatever other labs needed to be checked.

## 2022-05-31 ENCOUNTER — Other Ambulatory Visit: Payer: Self-pay | Admitting: Family Medicine

## 2022-05-31 DIAGNOSIS — E782 Mixed hyperlipidemia: Secondary | ICD-10-CM

## 2022-05-31 DIAGNOSIS — Z8546 Personal history of malignant neoplasm of prostate: Secondary | ICD-10-CM

## 2022-05-31 DIAGNOSIS — I10 Essential (primary) hypertension: Secondary | ICD-10-CM

## 2022-05-31 DIAGNOSIS — E039 Hypothyroidism, unspecified: Secondary | ICD-10-CM

## 2022-05-31 NOTE — Telephone Encounter (Signed)
Labs ordered for 5/22

## 2022-06-12 ENCOUNTER — Other Ambulatory Visit: Payer: Self-pay | Admitting: Family Medicine

## 2022-06-16 ENCOUNTER — Other Ambulatory Visit: Payer: Medicare Other

## 2022-06-16 DIAGNOSIS — E782 Mixed hyperlipidemia: Secondary | ICD-10-CM

## 2022-06-16 DIAGNOSIS — Z8546 Personal history of malignant neoplasm of prostate: Secondary | ICD-10-CM

## 2022-06-16 DIAGNOSIS — E039 Hypothyroidism, unspecified: Secondary | ICD-10-CM | POA: Diagnosis not present

## 2022-06-16 DIAGNOSIS — I10 Essential (primary) hypertension: Secondary | ICD-10-CM

## 2022-06-17 LAB — TSH+FREE T4
Free T4: 1.85 ng/dL — ABNORMAL HIGH (ref 0.82–1.77)
TSH: 3.01 u[IU]/mL (ref 0.450–4.500)

## 2022-06-21 LAB — CBC WITH DIFFERENTIAL/PLATELET
Basophils Absolute: 0 10*3/uL (ref 0.0–0.2)
Basos: 1 %
EOS (ABSOLUTE): 0.6 10*3/uL — ABNORMAL HIGH (ref 0.0–0.4)
Eos: 7 %
Hematocrit: 39.7 % (ref 37.5–51.0)
Hemoglobin: 12.6 g/dL — ABNORMAL LOW (ref 13.0–17.7)
Immature Grans (Abs): 0 10*3/uL (ref 0.0–0.1)
Immature Granulocytes: 1 %
Lymphocytes Absolute: 1.5 10*3/uL (ref 0.7–3.1)
Lymphs: 19 %
MCH: 29.1 pg (ref 26.6–33.0)
MCHC: 31.7 g/dL (ref 31.5–35.7)
MCV: 92 fL (ref 79–97)
Monocytes Absolute: 0.8 10*3/uL (ref 0.1–0.9)
Monocytes: 10 %
Neutrophils Absolute: 5.3 10*3/uL (ref 1.4–7.0)
Neutrophils: 62 %
Platelets: 355 10*3/uL (ref 150–450)
RBC: 4.33 x10E6/uL (ref 4.14–5.80)
RDW: 12.6 % (ref 11.6–15.4)
WBC: 8.2 10*3/uL (ref 3.4–10.8)

## 2022-06-21 LAB — CMP14+EGFR
ALT: 10 IU/L (ref 0–44)
AST: 15 IU/L (ref 0–40)
Albumin/Globulin Ratio: 1.5 (ref 1.2–2.2)
Albumin: 3.8 g/dL (ref 3.7–4.7)
Alkaline Phosphatase: 85 IU/L (ref 44–121)
BUN/Creatinine Ratio: 25 — ABNORMAL HIGH (ref 10–24)
BUN: 19 mg/dL (ref 8–27)
Bilirubin Total: 0.5 mg/dL (ref 0.0–1.2)
CO2: 21 mmol/L (ref 20–29)
Calcium: 9.5 mg/dL (ref 8.6–10.2)
Chloride: 103 mmol/L (ref 96–106)
Creatinine, Ser: 0.75 mg/dL — ABNORMAL LOW (ref 0.76–1.27)
Globulin, Total: 2.5 g/dL (ref 1.5–4.5)
Glucose: 102 mg/dL — ABNORMAL HIGH (ref 70–99)
Potassium: 5.1 mmol/L (ref 3.5–5.2)
Sodium: 135 mmol/L (ref 134–144)
Total Protein: 6.3 g/dL (ref 6.0–8.5)
eGFR: 87 mL/min/{1.73_m2} (ref >59)

## 2022-06-21 LAB — LIPID PANEL
Chol/HDL Ratio: 1.4 ratio (ref 0.0–5.0)
Cholesterol, Total: 131 mg/dL (ref 100–199)
HDL: 97 mg/dL (ref >39)
LDL Chol Calc (NIH): 19 mg/dL (ref 0–99)
Triglycerides: 75 mg/dL (ref 0–149)
VLDL Cholesterol Cal: 15 mg/dL (ref 5–40)

## 2022-06-21 LAB — TESTOSTERONE,FREE AND TOTAL
Testosterone, Free: 1.9 pg/mL — ABNORMAL LOW (ref 6.6–18.1)
Testosterone: 30 ng/dL — ABNORMAL LOW (ref 264–916)

## 2022-06-21 LAB — PSA, TOTAL AND FREE
PSA, Free: <0.02 ng/mL
Prostate Specific Ag, Serum: <0.1 ng/mL (ref 0.0–4.0)

## 2022-06-23 ENCOUNTER — Encounter: Payer: Self-pay | Admitting: Family Medicine

## 2022-06-23 ENCOUNTER — Ambulatory Visit (INDEPENDENT_AMBULATORY_CARE_PROVIDER_SITE_OTHER): Payer: Medicare Other | Admitting: Family Medicine

## 2022-06-23 VITALS — BP 146/70 | HR 83 | Temp 97.3°F | Ht 72.0 in | Wt 212.4 lb

## 2022-06-23 DIAGNOSIS — E039 Hypothyroidism, unspecified: Secondary | ICD-10-CM | POA: Diagnosis not present

## 2022-06-23 DIAGNOSIS — I10 Essential (primary) hypertension: Secondary | ICD-10-CM

## 2022-06-23 DIAGNOSIS — C61 Malignant neoplasm of prostate: Secondary | ICD-10-CM | POA: Diagnosis not present

## 2022-06-23 DIAGNOSIS — E782 Mixed hyperlipidemia: Secondary | ICD-10-CM | POA: Diagnosis not present

## 2022-06-23 MED ORDER — PANTOPRAZOLE SODIUM 20 MG PO TBEC: 20.0000 mg | DELAYED_RELEASE_TABLET | Freq: Every day | ORAL | 1 refills | Status: DC

## 2022-06-23 NOTE — Progress Notes (Signed)
Subjective:  Patient ID: [name], male    DOB: [birth year]  Age: 89 y.o. MRN: [id number]  CC: Medical Management of Chronic Issues   HPI [name] presents for  follow-up of hypertension. Patient has no history of headache chest pain or shortness of breath or recent cough. Patient also denies symptoms of TIA such as focal numbness or weakness. Patient denies side effects from medication. States taking it regularly.   in for follow-up of elevated cholesterol. Doing well without complaints on current medication. Denies side effects of statin including myalgia and arthralgia and nausea. Currently no chest pain, shortness of breath or other cardiovascular related symptoms noted.    follow-up on  thyroid. The patient has a history of hypothyroidism for many years. It has been stable recently. Pt. denies any change in  voice, loss of hair, heat or cold intolerance. Energy level has been adequate to good. Patient denies constipation and diarrhea. No myxedema. Medication is as noted below. Verified that pt is taking it daily on an empty stomach. Well tolerated.  Taking Xtandi for testosterone suppression due to Ca prostate. Currently in remission.   History [name] has a past medical history of Aphasia, Arthritis, BNC (bladder neck contracture), BPPV (benign paroxysmal positional vertigo), Cataract, Diverticulosis, H/O diarrhea (SECONDARY TO RADIATION THERAPY --  INTERMITANT  DIARRHEA), Hematuria, History of hemorrhagic cystitis, History of prostate cancer (CURRENTLY  ELEVATED PSA--  LUPRON INJECTIONS), Hyperlipidemia, Hypertension, Hypothyroidism, Radiation cystitis, Radiation proctitis, Urinary incontinence, Urinary retention, and Vitamin D deficiency.   He has a past surgical history that includes Prostatectomy (1997); Colonoscopy w/ polypectomy (2005; 08/11/2010); CYSTO/ FULGERATION OF BLEEDERS/ RESECTION BLADDER NECK CONTRACTURE (07-06-2004); Cataract extraction w/ intraocular lens  implant (Right); Lumbar disc surgery (1996); Transurethral resection of bladder neck (N/A, 06/23/2012); Transurethral resection of bladder tumor with gyrus (turbt-gyrus) (N/A, 06/01/2013); Eye surgery; Spine surgery; and Knee arthroscopy (Right).   His family history includes Alzheimer's disease in his brother; Appendicitis (age of onset: 3) in his sister; CVA (age of onset: 12) in his mother; Cancer in his sister and sister; Congestive Heart Failure in his mother; Early death in his sister; Hip fracture in his brother; Kidney disease in his brother and sister; Obesity in his sister; Pancreatitis in his father; Pneumonia in his father; Prostate cancer in his brother.He reports that he quit smoking about 59 years ago. His smoking use included cigarettes. He has been exposed to tobacco smoke. He quit smokeless tobacco use about 59 years ago.  His smokeless tobacco use included chew. He reports that he does not drink alcohol and does not use drugs.  Current Outpatient Medications on File Prior to Visit  Medication Sig Dispense Refill   amLODipine (NORVASC) 10 MG tablet Take 1 tablet (10 mg total) by mouth daily. 90 tablet 3   aspirin 325 MG tablet Take 1 tablet (325 mg total) by mouth daily.     atorvastatin (LIPITOR) 40 MG tablet Take 1 tablet by mouth once daily 90 tablet 0   azelastine (ASTELIN) 0.1 % nasal spray Place 2 sprays into both nostrils 2 (two) times daily. Use in each nostril as directed 30 mL 12   Cholecalciferol (VITAMIN D3) 5000 UNITS CAPS Take 1 capsule by mouth daily.     enzalutamide (XTANDI) 40 MG tablet Take 160 mg by mouth daily.     Leuprolide Acetate, 6 Month, (LUPRON) 45 MG injection Inject 45 mg into the muscle every 6 (six) months.     levothyroxine (SYNTHROID) 125 MCG  tablet Take 1 tablet (125 mcg total) by mouth daily before breakfast. 90 tablet 1   lisinopril (ZESTRIL) 40 MG tablet Take 1 tablet by mouth once daily 90 tablet 0   loratadine (CLARITIN) 10 MG tablet Take 10 mg  by mouth daily.     Magnesium 300 MG CAPS Take 1 capsule by mouth daily.     Multiple Vitamins-Minerals (CENTRUM SILVER ULTRA MENS PO) Take 1 tablet by mouth daily.     Probiotic Product (PROBIOTIC DAILY PO) Take 1 capsule by mouth daily.     sodium chloride (OCEAN) 0.65 % SOLN nasal spray Place 1 spray into both nostrils as needed for congestion.     No current facility-administered medications on file prior to visit.    ROS Review of Systems  Constitutional:  Negative for fever.  Respiratory:  Negative for shortness of breath.   Cardiovascular:  Negative for chest pain.  Musculoskeletal:  Negative for arthralgias.  Skin:  Negative for rash.    Objective:  BP (!) 146/70   Pulse 83   Temp (!) 97.3 F (36.3 C)   Ht 6' (1.829 m)   Wt 212 lb 6.4 oz (96.3 kg)   SpO2 95%   BMI 28.81 kg/m   BP Readings from Last 3 Encounters:  06/23/22 (!) 146/70  05/10/22 (!) 152/68  04/15/22 127/68    Wt Readings from Last 3 Encounters:  06/23/22 212 lb 6.4 oz (96.3 kg)  05/10/22 214 lb (97.1 kg)  04/15/22 213 lb 9.6 oz (96.9 kg)     Physical Exam Vitals reviewed.  Constitutional:      Appearance: He is well-developed.  HENT:     Head: Normocephalic and atraumatic.     Right Ear: External ear normal.     Left Ear: External ear normal.     Mouth/Throat:     Pharynx: No oropharyngeal exudate or posterior oropharyngeal erythema.  Eyes:     Pupils: Pupils are equal, round, and reactive to light.  Cardiovascular:     Rate and Rhythm: Normal rate and regular rhythm.     Heart sounds: No murmur heard. Pulmonary:     Effort: No respiratory distress.     Breath sounds: Normal breath sounds.  Musculoskeletal:     Cervical back: Normal range of motion and neck supple.  Neurological:     Mental Status: He is alert and oriented to person, place, and time.       Assessment & Plan:   [name] was seen today for medical management of chronic issues.  Diagnoses and all orders for  this visit:  Mixed hyperlipidemia  Acquired hypothyroidism  Essential hypertension  Prostate cancer (HCC)  Other orders -     pantoprazole (PROTONIX) 20 MG tablet; Take 1 tablet (20 mg total) by mouth daily. For stomach   Allergies as of 06/23/2022       Reactions   Caffeine Palpitations   Codeine Palpitations        Medication List        Accurate as of Jun 23, 2022  9:40 PM. If you have any questions, ask your nurse or doctor.          STOP taking these medications    acetaminophen 500 MG tablet Commonly known as: TYLENOL Stopped by: Mechele Claude, MD   FLAXSEED (LINSEED) PO Stopped by: Mechele Claude, MD   GARLIC PO Stopped by: Mechele Claude, MD   HYDROcodone-acetaminophen 5-325 MG tablet Commonly known as: Norco Stopped by: Mechele Claude,  MD   V-2 High Compression Hose Misc Stopped by: Mechele Claude, MD       TAKE these medications    amLODipine 10 MG tablet Commonly known as: NORVASC Take 1 tablet (10 mg total) by mouth daily.   aspirin 325 MG tablet Take 1 tablet (325 mg total) by mouth daily.   atorvastatin 40 MG tablet Commonly known as: LIPITOR Take 1 tablet by mouth once daily   azelastine 0.1 % nasal spray Commonly known as: ASTELIN Place 2 sprays into both nostrils 2 (two) times daily. Use in each nostril as directed   CENTRUM SILVER ULTRA MENS PO Take 1 tablet by mouth daily.   Leuprolide Acetate (6 Month) 45 MG injection Commonly known as: LUPRON Inject 45 mg into the muscle every 6 (six) months.   levothyroxine 125 MCG tablet Commonly known as: SYNTHROID Take 1 tablet (125 mcg total) by mouth daily before breakfast.   lisinopril 40 MG tablet Commonly known as: ZESTRIL Take 1 tablet by mouth once daily   loratadine 10 MG tablet Commonly known as: CLARITIN Take 10 mg by mouth daily.   Magnesium 300 MG Caps Take 1 capsule by mouth daily.   pantoprazole 20 MG tablet Commonly known as: PROTONIX Take 1 tablet (20  mg total) by mouth daily. For stomach Started by: Mechele Claude, MD   PROBIOTIC DAILY PO Take 1 capsule by mouth daily.   sodium chloride 0.65 % Soln nasal spray Commonly known as: OCEAN Place 1 spray into both nostrils as needed for congestion.   Vitamin D3 125 MCG (5000 UT) Caps Take 1 capsule by mouth daily.   Xtandi 40 MG tablet Generic drug: enzalutamide Take 160 mg by mouth daily.        Meds ordered this encounter  Medications   pantoprazole (PROTONIX) 20 MG tablet    Sig: Take 1 tablet (20 mg total) by mouth daily. For stomach    Dispense:  90 tablet    Refill:  1      Follow-up: Return in about 3 months (around 09/23/2022).  Mechele Claude, M.D.

## 2022-06-28 ENCOUNTER — Telehealth: Payer: Self-pay | Admitting: Family Medicine

## 2022-06-28 ENCOUNTER — Other Ambulatory Visit: Payer: Self-pay | Admitting: *Deleted

## 2022-06-28 DIAGNOSIS — E039 Hypothyroidism, unspecified: Secondary | ICD-10-CM

## 2022-06-28 DIAGNOSIS — I10 Essential (primary) hypertension: Secondary | ICD-10-CM

## 2022-06-28 DIAGNOSIS — E782 Mixed hyperlipidemia: Secondary | ICD-10-CM

## 2022-06-28 NOTE — Telephone Encounter (Signed)
Future lab order entered.

## 2022-06-29 ENCOUNTER — Telehealth: Payer: Self-pay | Admitting: Family Medicine

## 2022-06-29 NOTE — Telephone Encounter (Signed)
WHEN SHOULD PANTOPRAZOLE BE TAKEN AND WILL IT INTERFERE WITH HIS THYROID MED THAT HE TAKES A COUPLE HOURS BEFORE MEALS

## 2022-06-29 NOTE — Telephone Encounter (Signed)
Pt wants to speak with nurse regarding pts medications. Has a question about new medicine he just started on interacting with his thyroid medication.

## 2022-06-29 NOTE — Telephone Encounter (Signed)
ALSO WANTS TO KNOW IF ANY OF HIS OTHER MEDS WILL INTERACT WITH HIS THYROID MED

## 2022-06-29 NOTE — Telephone Encounter (Signed)
Wife aware

## 2022-06-29 NOTE — Telephone Encounter (Signed)
If he takes the thyoid medication on an empty stomach he will be fine. He CAN NOT take other meds with it.

## 2022-07-30 ENCOUNTER — Telehealth: Payer: Self-pay | Admitting: Family Medicine

## 2022-07-30 NOTE — Telephone Encounter (Signed)
Wife has been notified.

## 2022-07-30 NOTE — Telephone Encounter (Signed)
Pts wife called to let Dr Darlyn Read know that for the past 2 weeks pt has been having diarrhea and believes its coming from the Pantoprazole Rx that he has been taking. Needs advise on what Dr Darlyn Read recommends.

## 2022-07-30 NOTE — Telephone Encounter (Signed)
Try taking it every other day and see if that does better, if still having issues then stop it and make an appointment to come in and discuss other options

## 2022-08-02 NOTE — Telephone Encounter (Signed)
I agree with this plan. WS

## 2022-08-03 ENCOUNTER — Other Ambulatory Visit (HOSPITAL_BASED_OUTPATIENT_CLINIC_OR_DEPARTMENT_OTHER): Payer: Self-pay

## 2022-08-03 ENCOUNTER — Other Ambulatory Visit: Payer: Self-pay

## 2022-08-03 ENCOUNTER — Telehealth: Payer: Self-pay

## 2022-08-03 ENCOUNTER — Encounter (HOSPITAL_BASED_OUTPATIENT_CLINIC_OR_DEPARTMENT_OTHER): Payer: Self-pay | Admitting: Emergency Medicine

## 2022-08-03 ENCOUNTER — Telehealth: Payer: Self-pay | Admitting: Surgery

## 2022-08-03 ENCOUNTER — Emergency Department (HOSPITAL_BASED_OUTPATIENT_CLINIC_OR_DEPARTMENT_OTHER): Payer: Medicare Other

## 2022-08-03 ENCOUNTER — Emergency Department (HOSPITAL_BASED_OUTPATIENT_CLINIC_OR_DEPARTMENT_OTHER)
Admission: EM | Admit: 2022-08-03 | Discharge: 2022-08-03 | Disposition: A | Discharge status: Home / Self Care | Payer: Medicare Other | Attending: Emergency Medicine | Admitting: Emergency Medicine

## 2022-08-03 DIAGNOSIS — E871 Hypo-osmolality and hyponatremia: Secondary | ICD-10-CM

## 2022-08-03 DIAGNOSIS — R29898 Other symptoms and signs involving the musculoskeletal system: Secondary | ICD-10-CM | POA: Diagnosis not present

## 2022-08-03 DIAGNOSIS — R531 Weakness: Secondary | ICD-10-CM | POA: Insufficient documentation

## 2022-08-03 DIAGNOSIS — R410 Disorientation, unspecified: Secondary | ICD-10-CM | POA: Diagnosis not present

## 2022-08-03 DIAGNOSIS — Z79899 Other long term (current) drug therapy: Secondary | ICD-10-CM | POA: Insufficient documentation

## 2022-08-03 DIAGNOSIS — B351 Tinea unguium: Secondary | ICD-10-CM | POA: Diagnosis not present

## 2022-08-03 DIAGNOSIS — L84 Corns and callosities: Secondary | ICD-10-CM | POA: Diagnosis not present

## 2022-08-03 DIAGNOSIS — Z7982 Long term (current) use of aspirin: Secondary | ICD-10-CM | POA: Diagnosis not present

## 2022-08-03 DIAGNOSIS — M79676 Pain in unspecified toe(s): Secondary | ICD-10-CM | POA: Diagnosis not present

## 2022-08-03 DIAGNOSIS — R269 Unspecified abnormalities of gait and mobility: Secondary | ICD-10-CM | POA: Insufficient documentation

## 2022-08-03 DIAGNOSIS — I6782 Cerebral ischemia: Secondary | ICD-10-CM | POA: Diagnosis not present

## 2022-08-03 DIAGNOSIS — I70203 Unspecified atherosclerosis of native arteries of extremities, bilateral legs: Secondary | ICD-10-CM | POA: Diagnosis not present

## 2022-08-03 DIAGNOSIS — R2681 Unsteadiness on feet: Secondary | ICD-10-CM

## 2022-08-03 DIAGNOSIS — Z1152 Encounter for screening for COVID-19: Secondary | ICD-10-CM | POA: Diagnosis not present

## 2022-08-03 LAB — COMPREHENSIVE METABOLIC PANEL
ALT: 8 U/L (ref 0–44)
AST: 19 U/L (ref 15–41)
Albumin: 3.3 g/dL — ABNORMAL LOW (ref 3.5–5.0)
Alkaline Phosphatase: 55 U/L (ref 38–126)
Anion gap: 8 (ref 5–15)
BUN: 18 mg/dL (ref 8–23)
CO2: 22 mmol/L (ref 22–32)
Calcium: 8.5 mg/dL — ABNORMAL LOW (ref 8.9–10.3)
Chloride: 97 mmol/L — ABNORMAL LOW (ref 98–111)
Creatinine, Ser: 0.68 mg/dL (ref 0.61–1.24)
GFR, Estimated: >60 mL/min (ref >60)
Glucose, Bld: 106 mg/dL — ABNORMAL HIGH (ref 70–99)
Potassium: 4.2 mmol/L (ref 3.5–5.1)
Sodium: 127 mmol/L — ABNORMAL LOW (ref 135–145)
Total Bilirubin: 0.5 mg/dL (ref 0.3–1.2)
Total Protein: 5.9 g/dL — ABNORMAL LOW (ref 6.5–8.1)

## 2022-08-03 LAB — URINALYSIS, ROUTINE W REFLEX MICROSCOPIC
Bacteria, UA: NONE SEEN
Bilirubin Urine: NEGATIVE
Glucose, UA: NEGATIVE mg/dL
Hgb urine dipstick: NEGATIVE
Ketones, ur: NEGATIVE mg/dL
Nitrite: NEGATIVE
Protein, ur: NEGATIVE mg/dL
Specific Gravity, Urine: <1.005 — ABNORMAL LOW (ref 1.005–1.030)
pH: 7 (ref 5.0–8.0)

## 2022-08-03 LAB — SARS CORONAVIRUS 2 BY RT PCR: SARS Coronavirus 2 by RT PCR: NEGATIVE

## 2022-08-03 LAB — CBC WITH DIFFERENTIAL/PLATELET
Abs Immature Granulocytes: 0.05 10*3/uL (ref 0.00–0.07)
Basophils Absolute: 0 10*3/uL (ref 0.0–0.1)
Basophils Relative: 0 %
Eosinophils Absolute: 0.1 10*3/uL (ref 0.0–0.5)
Eosinophils Relative: 1 %
HCT: 31.2 % — ABNORMAL LOW (ref 39.0–52.0)
Hemoglobin: 10.3 g/dL — ABNORMAL LOW (ref 13.0–17.0)
Immature Granulocytes: 1 %
Lymphocytes Relative: 12 %
Lymphs Abs: 1.1 10*3/uL (ref 0.7–4.0)
MCH: 30.2 pg (ref 26.0–34.0)
MCHC: 33 g/dL (ref 30.0–36.0)
MCV: 91.5 fL (ref 80.0–100.0)
Monocytes Absolute: 0.7 10*3/uL (ref 0.1–1.0)
Monocytes Relative: 8 %
Neutro Abs: 7 10*3/uL (ref 1.7–7.7)
Neutrophils Relative %: 78 %
Platelets: 291 10*3/uL (ref 150–400)
RBC: 3.41 MIL/uL — ABNORMAL LOW (ref 4.22–5.81)
RDW: 14.5 % (ref 11.5–15.5)
WBC: 9 10*3/uL (ref 4.0–10.5)
nRBC: 0 % (ref 0.0–0.2)

## 2022-08-03 MED ORDER — SODIUM CHLORIDE 0.9 % IV BOLUS
500.0000 mL | Freq: Once | INTRAVENOUS | Status: AC
  Administered 2022-08-03: 500 mL via INTRAVENOUS

## 2022-08-03 NOTE — ED Triage Notes (Signed)
Pt arrives to ED with c/o left leg weakness x1 week and a fall this morning. Family members also noted slight confusion and more difficulty walking over the past week.

## 2022-08-03 NOTE — ED Notes (Signed)
Pt given discharge instructions. Opportunities given for questions. Pt verbalizes understanding. PIV removed x1. Quitman Norberto R, RN 

## 2022-08-03 NOTE — ED Provider Notes (Addendum)
Weak and confused. F/u urine, Tx if positive Physical Exam  BP (!) 133/57 (BP Location: Right Arm)   Pulse 81   Temp 98.3 F (36.8 C) (Oral)   Resp 18   Ht 6' (1.829 m)   Wt 98.4 kg   BMI 29.43 kg/m   Physical Exam  Procedures  Procedures  ED Course / MDM    Medical Decision Making Amount and/or Complexity of Data Reviewed Labs: ordered. Radiology: ordered.   Urinalysis no signs of infection.  Patient feels well.  Reviewed plan with family.  They are in agreement with going home and pursuing follow-up with neurology.  Return precautions reviewed.       Arby Barrette, MD 08/03/22 1526    Arby Barrette, MD 08/03/22 [phone]

## 2022-08-03 NOTE — Discharge Instructions (Signed)
You were seen in the emergency department today with some weakness and increasing memory issues.  We have placed a referral to the neurologist.  Please call the office to schedule a follow-up appointment.  I would like your primary care doctor to recheck your sodium levels in the next week.  Please continue to drink fluids.  Return with any new or suddenly worsening symptoms.

## 2022-08-03 NOTE — ED Provider Notes (Signed)
[place] EMERGENCY DEPARTMENT AT [facility] Provider Note   CSN: [id number] Arrival date & time: 08/03/22  1122     History  Chief Complaint  Patient presents with   Weakness   Fall    [name] is a 89 y.o. male.  Patient comes in for concern of weakness and fall this morning.  Son and wife at bedside, who notes that patient has been a little more confused lately, thinking there are more people in the house then really are, and confusing son for brother.  This confusion has been going on for about a week.  Patient has a lot of assistance at home with ADLs, but can manage on his own and usually showers by himself.  Wife and son notes that patient went to podiatry appointment today, as they were concerned why he had been having slower motion/taking smaller steps when walking.  Family notes that patient had been dragging left leg, and appearing as though he could not pick it up off the floor, while walking.  Family went to podiatrist office today, where patient was standing, and then sat down onto the floor.  Son notes patient has been walking more than usual, and this may have played a role.  Patient was otherwise normal, and they were able to get him off the floor and into her room at which point podiatry provider recommended he follow-up with PCP, who recommended he follow-up with the ED.  The history is provided by the patient, the spouse and a caregiver.  Weakness Severity:  Mild Onset quality:  Gradual Progression:  Waxing and waning Chronicity:  New Associated symptoms: no diarrhea, no fever, no nausea and no vomiting   Fall       Home Medications Prior to Admission medications   Medication Sig Start Date End Date Taking? Authorizing Provider  amLODipine (NORVASC) 10 MG tablet Take 1 tablet (10 mg total) by mouth daily. 08/19/21   Mechele Claude, MD  aspirin 325 MG tablet Take 1 tablet (325 mg total) by mouth daily. 04/05/16   Rama, Maryruth Bun, MD   atorvastatin (LIPITOR) 40 MG tablet Take 1 tablet by mouth once daily 06/14/22   Mechele Claude, MD  azelastine (ASTELIN) 0.1 % nasal spray Place 2 sprays into both nostrils 2 (two) times daily. Use in each nostril as directed 02/22/22   Mechele Claude, MD  Cholecalciferol (VITAMIN D3) 5000 UNITS CAPS Take 1 capsule by mouth daily.    [provider]  enzalutamide Diana Eves) 40 MG tablet Take 160 mg by mouth daily.    [provider]  Leuprolide Acetate, 6 Month, (LUPRON) 45 MG injection Inject 45 mg into the muscle every 6 (six) months.    [provider]  levothyroxine (SYNTHROID) 125 MCG tablet Take 1 tablet (125 mcg total) by mouth daily before breakfast. 04/15/22   Mechele Claude, MD  lisinopril (ZESTRIL) 40 MG tablet Take 1 tablet by mouth once daily 06/14/22   Mechele Claude, MD  loratadine (CLARITIN) 10 MG tablet Take 10 mg by mouth daily.    [provider]  Magnesium 300 MG CAPS Take 1 capsule by mouth daily.    [provider]  Multiple Vitamins-Minerals (CENTRUM SILVER ULTRA MENS PO) Take 1 tablet by mouth daily.    [provider]  pantoprazole (PROTONIX) 20 MG tablet Take 1 tablet (20 mg total) by mouth daily. For stomach 06/23/22   Mechele Claude, MD  Probiotic Product (PROBIOTIC DAILY PO) Take 1 capsule by  mouth daily.    [provider]  sodium chloride (OCEAN) 0.65 % SOLN nasal spray Place 1 spray into both nostrils as needed for congestion.    [provider]      Allergies    Caffeine and Codeine    Review of Systems   Review of Systems  Constitutional:  Negative for fever.  Gastrointestinal:  Negative for diarrhea, nausea and vomiting.  Neurological:  Positive for weakness.  Psychiatric/Behavioral:  Positive for hallucinations.     Physical Exam Updated Vital Signs Temp 98.3 F (36.8 C) (Oral)   Ht 6' (1.829 m)   Wt 98.4 kg   BMI 29.43 kg/m  Physical Exam Constitutional:      General: He is not  in acute distress.    Appearance: Normal appearance. He is normal weight. He is not ill-appearing.  Neurological:     Mental Status: He is alert. Mental status is at baseline.     Cranial Nerves: Cranial nerves 2-12 are intact. No cranial nerve deficit, dysarthria or facial asymmetry.     Sensory: Sensation is intact. No sensory deficit.     Motor: Motor function is intact. No weakness or tremor (Gait: wide based w/ shuffling steps).     Coordination: Coordination is intact. Finger-Nose-Finger Test and Heel to [place] Test normal.     Gait: Tandem walk abnormal.     ED Results / Procedures / Treatments   Labs (all labs ordered are listed, but only abnormal results are displayed) Labs Reviewed - No data to display  EKG None  Radiology No results found.  Procedures Procedures    Medications Ordered in ED Medications - No data to display  ED Course/ Medical Decision Making/ A&P                             Medical Decision Making Patient comes in for recent onset of memory changes, and weakness.  Patient was taken to podiatry appointment by family, for change in gait with shuffled steps.  At appointment patient sat onto the floor, concerning for new onset weakness.  Patient was sent to ED by PCP after recommendation from podiatrist.  Patient is at his baseline mental status, and has normal neuroexam.  Family have reported some concern and left leg weakness, however not present on exam today.  Looking at patient's gait, and hearing history of possible delirium, patient may be suffering from Parkinson's.  Low concern for stroke at this time given neuroexam and history.  Patient otherwise normal state of health no concerns for infectious etiology.  Given vague symptoms will elect to image patient's head, to rule out hematoma, or normal pressure hydrocephalus. Will also obtain labs to rule out infection.  CT Head was benign and showed no acute intracranial abnormalities. It was considered  wether or not patient should go have MRI done, but through shared decision making, the family elected to discharge home w/ HHPT and Neurology follow up.   Care taken over by Dr. Alona Bene at end of provider's shift.  Amount and/or Complexity of Data Reviewed Labs: ordered.    Details: CBC, CMP, Covid/Flu, UA Radiology: ordered.    Details: CT head WO was benign and showed no acute intracranial abnormalities          Final Clinical Impression(s) / ED Diagnoses Final diagnoses:  None    Rx / DC Orders ED Discharge Orders     None  Bess Kinds, MD 08/03/22 1458    Maia Plan, MD 08/05/22 [phone]

## 2022-08-03 NOTE — Telephone Encounter (Signed)
Patient's wife called - patient was at Dr. Arvil Chaco office today and had a fall. Patient has been having trouble walking x 1+ wk - having trouble lifting his feet. Wife states that his ongoing confession is getting worse. She also states that his eyes are red and watery and has new onset on shaking and sweating.  Advised the wife that the patient needs to go to the ED asap for eval and treatment. She expressed understanding and states they will take him now. She will call back to set up a follow up appointment

## 2022-08-03 NOTE — Telephone Encounter (Signed)
Will call patient in the am about [facility] recomendations

## 2022-08-05 DIAGNOSIS — Z8546 Personal history of malignant neoplasm of prostate: Secondary | ICD-10-CM | POA: Diagnosis not present

## 2022-08-05 DIAGNOSIS — R339 Retention of urine, unspecified: Secondary | ICD-10-CM | POA: Diagnosis not present

## 2022-08-05 DIAGNOSIS — M1711 Unilateral primary osteoarthritis, right knee: Secondary | ICD-10-CM | POA: Diagnosis not present

## 2022-08-05 DIAGNOSIS — Z9181 History of falling: Secondary | ICD-10-CM | POA: Diagnosis not present

## 2022-08-05 DIAGNOSIS — R269 Unspecified abnormalities of gait and mobility: Secondary | ICD-10-CM | POA: Diagnosis not present

## 2022-08-05 DIAGNOSIS — I10 Essential (primary) hypertension: Secondary | ICD-10-CM | POA: Diagnosis not present

## 2022-08-05 DIAGNOSIS — E039 Hypothyroidism, unspecified: Secondary | ICD-10-CM | POA: Diagnosis not present

## 2022-08-05 DIAGNOSIS — E559 Vitamin D deficiency, unspecified: Secondary | ICD-10-CM | POA: Diagnosis not present

## 2022-08-05 DIAGNOSIS — R32 Unspecified urinary incontinence: Secondary | ICD-10-CM | POA: Diagnosis not present

## 2022-08-05 DIAGNOSIS — K5790 Diverticulosis of intestine, part unspecified, without perforation or abscess without bleeding: Secondary | ICD-10-CM | POA: Diagnosis not present

## 2022-08-05 DIAGNOSIS — I69398 Other sequelae of cerebral infarction: Secondary | ICD-10-CM | POA: Diagnosis not present

## 2022-08-05 DIAGNOSIS — E871 Hypo-osmolality and hyponatremia: Secondary | ICD-10-CM | POA: Diagnosis not present

## 2022-08-05 DIAGNOSIS — E782 Mixed hyperlipidemia: Secondary | ICD-10-CM | POA: Diagnosis not present

## 2022-08-05 DIAGNOSIS — M6281 Muscle weakness (generalized): Secondary | ICD-10-CM | POA: Diagnosis not present

## 2022-08-05 DIAGNOSIS — H8110 Benign paroxysmal vertigo, unspecified ear: Secondary | ICD-10-CM | POA: Diagnosis not present

## 2022-08-06 ENCOUNTER — Telehealth: Payer: Self-pay | Admitting: Family Medicine

## 2022-08-10 DIAGNOSIS — I69398 Other sequelae of cerebral infarction: Secondary | ICD-10-CM | POA: Diagnosis not present

## 2022-08-10 DIAGNOSIS — M1711 Unilateral primary osteoarthritis, right knee: Secondary | ICD-10-CM | POA: Diagnosis not present

## 2022-08-10 DIAGNOSIS — E871 Hypo-osmolality and hyponatremia: Secondary | ICD-10-CM | POA: Diagnosis not present

## 2022-08-10 DIAGNOSIS — R269 Unspecified abnormalities of gait and mobility: Secondary | ICD-10-CM | POA: Diagnosis not present

## 2022-08-10 DIAGNOSIS — I10 Essential (primary) hypertension: Secondary | ICD-10-CM | POA: Diagnosis not present

## 2022-08-10 DIAGNOSIS — M6281 Muscle weakness (generalized): Secondary | ICD-10-CM | POA: Diagnosis not present

## 2022-08-12 DIAGNOSIS — M6281 Muscle weakness (generalized): Secondary | ICD-10-CM | POA: Diagnosis not present

## 2022-08-12 DIAGNOSIS — M1711 Unilateral primary osteoarthritis, right knee: Secondary | ICD-10-CM | POA: Diagnosis not present

## 2022-08-12 DIAGNOSIS — I10 Essential (primary) hypertension: Secondary | ICD-10-CM | POA: Diagnosis not present

## 2022-08-12 DIAGNOSIS — I69398 Other sequelae of cerebral infarction: Secondary | ICD-10-CM | POA: Diagnosis not present

## 2022-08-12 DIAGNOSIS — R269 Unspecified abnormalities of gait and mobility: Secondary | ICD-10-CM | POA: Diagnosis not present

## 2022-08-12 DIAGNOSIS — E871 Hypo-osmolality and hyponatremia: Secondary | ICD-10-CM | POA: Diagnosis not present

## 2022-08-17 ENCOUNTER — Telehealth: Payer: Self-pay | Admitting: Family Medicine

## 2022-08-17 DIAGNOSIS — I10 Essential (primary) hypertension: Secondary | ICD-10-CM | POA: Diagnosis not present

## 2022-08-17 DIAGNOSIS — E871 Hypo-osmolality and hyponatremia: Secondary | ICD-10-CM | POA: Diagnosis not present

## 2022-08-17 DIAGNOSIS — C3490 Malignant neoplasm of unspecified part of unspecified bronchus or lung: Secondary | ICD-10-CM

## 2022-08-17 DIAGNOSIS — I69398 Other sequelae of cerebral infarction: Secondary | ICD-10-CM | POA: Diagnosis not present

## 2022-08-17 DIAGNOSIS — R269 Unspecified abnormalities of gait and mobility: Secondary | ICD-10-CM | POA: Diagnosis not present

## 2022-08-17 DIAGNOSIS — M1711 Unilateral primary osteoarthritis, right knee: Secondary | ICD-10-CM | POA: Diagnosis not present

## 2022-08-17 DIAGNOSIS — M6281 Muscle weakness (generalized): Secondary | ICD-10-CM | POA: Diagnosis not present

## 2022-08-18 DIAGNOSIS — I10 Essential (primary) hypertension: Secondary | ICD-10-CM | POA: Diagnosis not present

## 2022-08-18 DIAGNOSIS — I69398 Other sequelae of cerebral infarction: Secondary | ICD-10-CM | POA: Diagnosis not present

## 2022-08-18 DIAGNOSIS — M1711 Unilateral primary osteoarthritis, right knee: Secondary | ICD-10-CM | POA: Diagnosis not present

## 2022-08-18 DIAGNOSIS — M6281 Muscle weakness (generalized): Secondary | ICD-10-CM | POA: Diagnosis not present

## 2022-08-18 DIAGNOSIS — E871 Hypo-osmolality and hyponatremia: Secondary | ICD-10-CM | POA: Diagnosis not present

## 2022-08-18 DIAGNOSIS — R269 Unspecified abnormalities of gait and mobility: Secondary | ICD-10-CM | POA: Diagnosis not present

## 2022-08-18 NOTE — Telephone Encounter (Signed)
Yes, please.

## 2022-08-19 NOTE — Telephone Encounter (Signed)
Order faxed to [facility]

## 2022-08-20 NOTE — Telephone Encounter (Signed)
Order needs to be faxed to [facility] because [employer].

## 2022-08-20 NOTE — Telephone Encounter (Signed)
Order faxed to [facility]

## 2022-08-22 ENCOUNTER — Emergency Department (HOSPITAL_COMMUNITY): Payer: Medicare Other

## 2022-08-22 ENCOUNTER — Emergency Department (HOSPITAL_COMMUNITY)
Admission: EM | Admit: 2022-08-22 | Discharge: 2022-08-22 | Disposition: A | Discharge status: Home / Self Care | Payer: Medicare Other | Attending: Emergency Medicine | Admitting: Emergency Medicine

## 2022-08-22 ENCOUNTER — Other Ambulatory Visit: Payer: Self-pay

## 2022-08-22 DIAGNOSIS — N3000 Acute cystitis without hematuria: Secondary | ICD-10-CM

## 2022-08-22 DIAGNOSIS — R918 Other nonspecific abnormal finding of lung field: Secondary | ICD-10-CM | POA: Diagnosis not present

## 2022-08-22 DIAGNOSIS — M8580 Other specified disorders of bone density and structure, unspecified site: Secondary | ICD-10-CM | POA: Diagnosis not present

## 2022-08-22 DIAGNOSIS — M1711 Unilateral primary osteoarthritis, right knee: Secondary | ICD-10-CM | POA: Diagnosis not present

## 2022-08-22 DIAGNOSIS — Z79899 Other long term (current) drug therapy: Secondary | ICD-10-CM | POA: Diagnosis not present

## 2022-08-22 DIAGNOSIS — R102 Pelvic and perineal pain: Secondary | ICD-10-CM | POA: Diagnosis not present

## 2022-08-22 DIAGNOSIS — I10 Essential (primary) hypertension: Secondary | ICD-10-CM | POA: Diagnosis not present

## 2022-08-22 DIAGNOSIS — M6281 Muscle weakness (generalized): Secondary | ICD-10-CM | POA: Diagnosis not present

## 2022-08-22 DIAGNOSIS — G459 Transient cerebral ischemic attack, unspecified: Secondary | ICD-10-CM | POA: Diagnosis not present

## 2022-08-22 DIAGNOSIS — Z7982 Long term (current) use of aspirin: Secondary | ICD-10-CM | POA: Diagnosis not present

## 2022-08-22 DIAGNOSIS — I959 Hypotension, unspecified: Secondary | ICD-10-CM | POA: Diagnosis not present

## 2022-08-22 DIAGNOSIS — R531 Weakness: Secondary | ICD-10-CM

## 2022-08-22 DIAGNOSIS — I700 Atherosclerosis of aorta: Secondary | ICD-10-CM | POA: Diagnosis not present

## 2022-08-22 DIAGNOSIS — E871 Hypo-osmolality and hyponatremia: Secondary | ICD-10-CM

## 2022-08-22 DIAGNOSIS — M85861 Other specified disorders of bone density and structure, right lower leg: Secondary | ICD-10-CM | POA: Diagnosis not present

## 2022-08-22 DIAGNOSIS — M25561 Pain in right knee: Secondary | ICD-10-CM | POA: Diagnosis not present

## 2022-08-22 LAB — CBC
HCT: 34.6 % — ABNORMAL LOW (ref 39.0–52.0)
Hemoglobin: 11.5 g/dL — ABNORMAL LOW (ref 13.0–17.0)
MCH: 30.3 pg (ref 26.0–34.0)
MCHC: 33.2 g/dL (ref 30.0–36.0)
MCV: 91.1 fL (ref 80.0–100.0)
Platelets: 315 10*3/uL (ref 150–400)
RBC: 3.8 MIL/uL — ABNORMAL LOW (ref 4.22–5.81)
RDW: 14.7 % (ref 11.5–15.5)
WBC: 10.2 10*3/uL (ref 4.0–10.5)
nRBC: 0 % (ref 0.0–0.2)

## 2022-08-22 LAB — URINALYSIS, ROUTINE W REFLEX MICROSCOPIC
Bilirubin Urine: NEGATIVE
Glucose, UA: NEGATIVE mg/dL
Ketones, ur: NEGATIVE mg/dL
Nitrite: NEGATIVE
Protein, ur: NEGATIVE mg/dL
Specific Gravity, Urine: 1.008 (ref 1.005–1.030)
pH: 7 (ref 5.0–8.0)

## 2022-08-22 LAB — BASIC METABOLIC PANEL
Anion gap: 4 — ABNORMAL LOW (ref 5–15)
BUN: 13 mg/dL (ref 8–23)
CO2: 24 mmol/L (ref 22–32)
Calcium: 8.6 mg/dL — ABNORMAL LOW (ref 8.9–10.3)
Chloride: 97 mmol/L — ABNORMAL LOW (ref 98–111)
Creatinine, Ser: 0.81 mg/dL (ref 0.61–1.24)
GFR, Estimated: >60 mL/min (ref >60)
Glucose, Bld: 120 mg/dL — ABNORMAL HIGH (ref 70–99)
Potassium: 4 mmol/L (ref 3.5–5.1)
Sodium: 125 mmol/L — ABNORMAL LOW (ref 135–145)

## 2022-08-22 LAB — HEPATIC FUNCTION PANEL
ALT: 11 U/L (ref 0–44)
AST: 18 U/L (ref 15–41)
Albumin: 3 g/dL — ABNORMAL LOW (ref 3.5–5.0)
Alkaline Phosphatase: 69 U/L (ref 38–126)
Bilirubin, Direct: 0.3 mg/dL — ABNORMAL HIGH (ref 0.0–0.2)
Indirect Bilirubin: 1 mg/dL — ABNORMAL HIGH (ref 0.3–0.9)
Total Bilirubin: 1.3 mg/dL — ABNORMAL HIGH (ref 0.3–1.2)
Total Protein: 6.7 g/dL (ref 6.5–8.1)

## 2022-08-22 LAB — CBG MONITORING, ED: Glucose-Capillary: 122 mg/dL — ABNORMAL HIGH (ref 70–99)

## 2022-08-22 MED ORDER — CEPHALEXIN 500 MG PO CAPS: 500.0000 mg | ORAL_CAPSULE | Freq: Four times a day (QID) | ORAL | 0 refills | Status: DC

## 2022-08-22 NOTE — ED Triage Notes (Signed)
Pt arrived [facility] for weakness. Pt has physical therapy that comes out to his house and they were out there Thurs. Daughter has been getting pt to practice therapy with walker. Wife called EMS because pt has become weak and she could not get pt off of toilet. Wife wants pt assessed for his new weakness.

## 2022-08-22 NOTE — Discharge Instructions (Signed)
Increase your salt intake and follow-up with your family doctor this week so they can check results of your urine culture

## 2022-08-23 ENCOUNTER — Ambulatory Visit: Payer: Medicare Other | Admitting: Family Medicine

## 2022-08-23 ENCOUNTER — Telehealth: Payer: Self-pay | Admitting: Family Medicine

## 2022-08-23 ENCOUNTER — Telehealth (INDEPENDENT_AMBULATORY_CARE_PROVIDER_SITE_OTHER): Payer: Medicare Other | Admitting: Family Medicine

## 2022-08-23 ENCOUNTER — Encounter: Payer: Self-pay | Admitting: Family Medicine

## 2022-08-23 DIAGNOSIS — M1711 Unilateral primary osteoarthritis, right knee: Secondary | ICD-10-CM | POA: Diagnosis not present

## 2022-08-23 DIAGNOSIS — C3490 Malignant neoplasm of unspecified part of unspecified bronchus or lung: Secondary | ICD-10-CM

## 2022-08-23 DIAGNOSIS — R531 Weakness: Secondary | ICD-10-CM | POA: Diagnosis not present

## 2022-08-23 DIAGNOSIS — K627 Radiation proctitis: Secondary | ICD-10-CM

## 2022-08-23 DIAGNOSIS — Z8673 Personal history of transient ischemic attack (TIA), and cerebral infarction without residual deficits: Secondary | ICD-10-CM | POA: Diagnosis not present

## 2022-08-23 DIAGNOSIS — I69998 Other sequelae following unspecified cerebrovascular disease: Secondary | ICD-10-CM

## 2022-08-23 DIAGNOSIS — R42 Dizziness and giddiness: Secondary | ICD-10-CM

## 2022-08-23 NOTE — Telephone Encounter (Signed)
Son called back and says that they have decided to go with [facility]. Son has not contacted the facility yet.   Spouse and son Gery Pray asking for nurse to rc for additional information on how to assist pt until they wait for Wednesday when [facility] will be ready. Pt needs help in the home moving and using the restroom.

## 2022-08-23 NOTE — Telephone Encounter (Signed)
Pts son called to let Dr Dettinger know that both he and his mom would like to proceed with FL2 for nursing home because they have realized that they are both unable to take care of pt. Says this afternoon after getting off the video call with Dr Louanne Skye, they went to help pt use the bathroom and change his depend and pt couldn't even get out of bed. They are not able to get him out of bed and dont know what to do. Please advise and call pts wife or son about this.

## 2022-08-23 NOTE — Telephone Encounter (Signed)
Any suggestions>?

## 2022-08-23 NOTE — Progress Notes (Addendum)
Brien Few recently  [employer] via MyChart video note  I connected with [name] on 08/23/22 at 1422 by video and verified that I am speaking with the correct person using two identifiers. [name] is currently located at home and  son and wife  are currently with her during visit. The provider, Elige Radon Shealynn Saulnier, MD is located in their office at time of visit.  Call ended at 1444  I discussed the limitations, risks, security and privacy concerns of performing an evaluation and management service by video and the availability of in person appointments. I also discussed with the patient that there may be a patient responsible charge related to this service. The patient expressed understanding and agreed to proceed.   History and Present Illness: Patient has been falling and weakness.  Yesterday he fell in the bathtub and also could not get up of the toilet. He did have a slight uti at ED yesterday and had a lot of arthritis in his right knee. He had an episode of nearly falling last week at podiatry. He saw ED on 7/9 and had scans from old stroke. They did a full neuro exam. He is decreased mobility and decreased over the past couple months.  He has a lift chair and is struggling with that as well.  He can't lift himself off the toilet.  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  They can't get him out of bed and he can't lift himself either.  He has normally bowel and urinary incontinence. He was noted to be weak last week.    Outpatient Encounter Medications as of 08/23/2022  Medication Sig   amLODipine (NORVASC) 10 MG tablet Take 1 tablet (10 mg total) by mouth daily.   aspirin 325 MG tablet Take 1 tablet (325 mg total) by mouth daily.   atorvastatin (LIPITOR) 40 MG tablet Take 1 tablet by mouth once daily   azelastine (ASTELIN) 0.1 % nasal spray Place 2 sprays into both nostrils 2 (two) times daily. Use in  each nostril as directed   cephALEXin (KEFLEX) 500 MG capsule Take 1 capsule (500 mg total) by mouth 4 (four) times daily.   Cholecalciferol (VITAMIN D3) 5000 UNITS CAPS Take 1 capsule by mouth daily.   enzalutamide (XTANDI) 40 MG tablet Take 160 mg by mouth daily.   Leuprolide Acetate, 6 Month, (LUPRON) 45 MG injection Inject 45 mg into the muscle every 6 (six) months.   levothyroxine (SYNTHROID) 125 MCG tablet Take 1 tablet (125 mcg total) by mouth daily before breakfast.   lisinopril (ZESTRIL) 40 MG tablet Take 1 tablet by mouth once daily   loratadine (CLARITIN) 10 MG tablet Take 10 mg by mouth daily.   Magnesium 300 MG CAPS Take 1 capsule by mouth daily.   Multiple Vitamins-Minerals (CENTRUM SILVER ULTRA MENS PO) Take 1 tablet by mouth daily.   pantoprazole (PROTONIX) 20 MG tablet Take 1 tablet (20 mg total) by mouth daily. For stomach   Probiotic Product (PROBIOTIC DAILY PO) Take 1 capsule by mouth daily.   sodium chloride (OCEAN) 0.65 % SOLN nasal spray Place 1 spray into both nostrils as needed for congestion.   No facility-administered encounter medications on file as of 08/23/2022.    Review of Systems  Constitutional:  Negative for chills and fever.  Eyes:  Negative for visual disturbance.  Respiratory:  Negative for shortness of breath and wheezing.  Cardiovascular:  Negative for chest pain and leg swelling.  Musculoskeletal:  Positive for arthralgias, gait problem and myalgias. Negative for back pain.  Skin:  Negative for rash.  All other systems reviewed and are negative.   Observations/Objective: Patient sounds and looks comfortable and in no acute distress  Assessment and Plan: Problem List Items Addressed This Visit       Respiratory   Malignant neoplasm of unspecified part of unspecified bronchus or lung [facility])     Digestive   Radiation proctitis     Musculoskeletal and Integument   Osteoarthritis of right knee   Relevant Orders   Ambulatory referral to  Home Health   For home use only DME Other see comment   For home use only DME Hospital bed   For home use only DME Other see comment     Other   Dizziness   Other Visit Diagnoses     Weakness due to old stroke    -  Primary   Relevant Orders   Ambulatory referral to Home Health   For home use only DME Other see comment   For home use only DME Hospital bed   For home use only DME Other see comment   History of CVA (cerebrovascular accident)       Relevant Orders   Ambulatory referral to Home Health   For home use only DME Other see comment   For home use only DME Hospital bed   For home use only DME Other see comment       Considering FL2 and placement vs increasing home health.  Will increase therapy and give lift and hospital bed.   He is being treated for uti currently.   They are wanting to do the FL 2 now and we will get that process started.  He is significantly weak and bowel incontinent and urinary incontinent and wears depends and at this point cannot help them at all to lift him out of bed or off the toilet showers and has had multiple falls recently. Follow up plan: Return if symptoms worsen or fail to improve.     I discussed the assessment and treatment plan with the patient. The patient was provided an opportunity to ask questions and all were answered. The patient agreed with the plan and demonstrated an understanding of the instructions.   The patient was advised to call back or seek an in-person evaluation if the symptoms worsen or if the condition fails to improve as anticipated.  The above assessment and management plan was discussed with the patient. The patient verbalized understanding of and has agreed to the management plan. Patient is aware to call the clinic if symptoms persist or worsen. Patient is aware when to return to the clinic for a follow-up visit. Patient educated on when it is appropriate to go to the emergency department.    I provided 22  minutes of non-face-to-face time during this encounter.    Nils Pyle, MD

## 2022-08-23 NOTE — ED Provider Notes (Signed)
[place] EMERGENCY DEPARTMENT AT [facility] Provider Note   CSN: [id number] Arrival date & time: 08/22/22  4098     History  Chief Complaint  Patient presents with   Weakness    [name] is a 89 y.o. male.  Patient has a history of hypertension and osteoarthritis of his knees.  Patient was on the commode today and was too weak to get up.  Patient usually walks with a walker  The history is provided by the patient and a relative. No language interpreter was used.  Weakness Severity:  Moderate Onset quality:  Gradual Timing:  Constant Progression:  Waxing and waning Chronicity:  Recurrent Context: not alcohol use   Relieved by:  Nothing Worsened by:  Nothing Ineffective treatments:  None tried Associated symptoms: no abdominal pain, no chest pain, no cough, no diarrhea, no frequency, no headaches and no seizures        Home Medications Prior to Admission medications   Medication Sig Start Date End Date Taking? Authorizing Provider  cephALEXin (KEFLEX) 500 MG capsule Take 1 capsule (500 mg total) by mouth 4 (four) times daily. 08/22/22  Yes Bethann Berkshire, MD  amLODipine (NORVASC) 10 MG tablet Take 1 tablet (10 mg total) by mouth daily. 08/19/21   Mechele Claude, MD  aspirin 325 MG tablet Take 1 tablet (325 mg total) by mouth daily. 04/05/16   Rama, Maryruth Bun, MD  atorvastatin (LIPITOR) 40 MG tablet Take 1 tablet by mouth once daily 06/14/22   Mechele Claude, MD  azelastine (ASTELIN) 0.1 % nasal spray Place 2 sprays into both nostrils 2 (two) times daily. Use in each nostril as directed 02/22/22   Mechele Claude, MD  Cholecalciferol (VITAMIN D3) 5000 UNITS CAPS Take 1 capsule by mouth daily.    [provider]  enzalutamide Diana Eves) 40 MG tablet Take 160 mg by mouth daily.    [provider]  Leuprolide Acetate, 6 Month, (LUPRON) 45 MG injection Inject 45 mg into the muscle every 6 (six) months.    [provider]  levothyroxine  (SYNTHROID) 125 MCG tablet Take 1 tablet (125 mcg total) by mouth daily before breakfast. 04/15/22   Mechele Claude, MD  lisinopril (ZESTRIL) 40 MG tablet Take 1 tablet by mouth once daily 06/14/22   Mechele Claude, MD  loratadine (CLARITIN) 10 MG tablet Take 10 mg by mouth daily.    [provider]  Magnesium 300 MG CAPS Take 1 capsule by mouth daily.    [provider]  Multiple Vitamins-Minerals (CENTRUM SILVER ULTRA MENS PO) Take 1 tablet by mouth daily.    [provider]  pantoprazole (PROTONIX) 20 MG tablet Take 1 tablet (20 mg total) by mouth daily. For stomach 06/23/22   Mechele Claude, MD  Probiotic Product (PROBIOTIC DAILY PO) Take 1 capsule by mouth daily.    [provider]  sodium chloride (OCEAN) 0.65 % SOLN nasal spray Place 1 spray into both nostrils as needed for congestion.    [provider]      Allergies    Caffeine and Codeine    Review of Systems   Review of Systems  Constitutional:  Negative for appetite change and fatigue.  HENT:  Negative for congestion, ear discharge and sinus pressure.   Eyes:  Negative for discharge.  Respiratory:  Negative for cough.   Cardiovascular:  Negative for chest pain.  Gastrointestinal:  Negative for abdominal pain and diarrhea.  Genitourinary:  Negative for frequency and hematuria.  Musculoskeletal:  Negative for back pain.  Skin:  Negative for rash.  Neurological:  Positive for weakness. Negative for seizures and headaches.  Psychiatric/Behavioral:  Negative for hallucinations.     Physical Exam Updated Vital Signs BP (!) 144/73   Pulse 70   Temp 97.8 F (36.6 C) (Oral)   Resp 17   Ht 6' (1.829 m)   Wt 98.4 kg   SpO2 95%   BMI 29.43 kg/m  Physical Exam Vitals and nursing note reviewed.  Constitutional:      Appearance: He is well-developed.  HENT:     Head: Normocephalic.     Nose: Nose normal.  Eyes:     General: No scleral icterus.    Conjunctiva/sclera: Conjunctivae  normal.  Neck:     Thyroid: No thyromegaly.  Cardiovascular:     Rate and Rhythm: Normal rate and regular rhythm.     Heart sounds: No murmur heard.    No friction rub. No gallop.  Pulmonary:     Breath sounds: No stridor. No wheezing or rales.  Chest:     Chest wall: No tenderness.  Abdominal:     General: There is no distension.     Tenderness: There is no abdominal tenderness. There is no rebound.  Musculoskeletal:        General: Normal range of motion.     Cervical back: Neck supple.     Comments: Patient able to ambulate with his walker  Lymphadenopathy:     Cervical: No cervical adenopathy.  Skin:    Findings: No erythema or rash.  Neurological:     Mental Status: He is alert and oriented to person, place, and time.     Motor: No abnormal muscle tone.     Coordination: Coordination normal.  Psychiatric:        Behavior: Behavior normal.     ED Results / Procedures / Treatments   Labs (all labs ordered are listed, but only abnormal results are displayed) Labs Reviewed  BASIC METABOLIC PANEL - Abnormal; Notable for the following components:      Result Value   Sodium 125 (*)    Chloride 97 (*)    Glucose, Bld 120 (*)    Calcium 8.6 (*)    Anion gap 4 (*)    All other components within normal limits  CBC - Abnormal; Notable for the following components:   RBC 3.80 (*)    Hemoglobin 11.5 (*)    HCT 34.6 (*)    All other components within normal limits  URINALYSIS, ROUTINE W REFLEX MICROSCOPIC - Abnormal; Notable for the following components:   APPearance HAZY (*)    Hgb urine dipstick MODERATE (*)    Leukocytes,Ua LARGE (*)    Bacteria, UA RARE (*)    All other components within normal limits  HEPATIC FUNCTION PANEL - Abnormal; Notable for the following components:   Albumin 3.0 (*)    Total Bilirubin 1.3 (*)    Bilirubin, Direct 0.3 (*)    Indirect Bilirubin 1.0 (*)    All other components within normal limits  CBG MONITORING, ED - Abnormal; Notable for  the following components:   Glucose-Capillary 122 (*)    All other components within normal limits  URINE CULTURE    EKG EKG Interpretation Date/Time:  Sunday August 22 2022 08:28:58 EDT Ventricular Rate:  79 PR Interval:  221 QRS Duration:  91 QT Interval:  383 QTC Calculation: 439 R Axis:   -21  Text Interpretation:  Sinus rhythm Prolonged PR interval Borderline left axis deviation Anteroseptal infarct, old Confirmed by Nicanor Alcon, April ([id number]) on 08/23/2022 9:43:42 AM  Radiology DG Knee Complete 4 Views Right  Result Date: 08/22/2022 CLINICAL DATA:  pain EXAM: RIGHT KNEE - COMPLETE 4+ VIEW COMPARISON:  None Available. FINDINGS: Osteopenia. No acute fracture or dislocation. Tricompartmental degenerative changes with moderate to severe joint space narrowing of the lateral compartment, moderate joint space narrowing of the medial compartment with osteophyte formation and moderate to severe joint space narrowing and osteophyte formation of the patellofemoral compartment. No area of erosion or osseous destruction. No unexpected radiopaque foreign body. Chondrocalcinosis. No significant joint effusion. Vascular calcifications. IMPRESSION: Tricompartmental degenerative changes. Electronically Signed   By: Meda Klinefelter M.D.   On: 08/22/2022 13:11   DG Pelvis Portable  Result Date: 08/22/2022 CLINICAL DATA:  pain weakness EXAM: PORTABLE PELVIS 1-2 VIEWS COMPARISON:  August 19, 2021, September 12, 2021 FINDINGS: Osteopenia. Revisualization of a compression fracture deformity of L5. Extent of vertebral body height loss is grossly similar in comparison to prior. Sacrum is completely obscured by overlapping bowel contents. No definitive acute fracture or dislocation on single AP view. Multiple pelvic clips. IMPRESSION: No definitive acute fracture or dislocation on single AP view. Grossly similar appearance of an L5 compression fracture deformity. If persistent clinical concern, recommend dedicated  cross-sectional imaging. Electronically Signed   By: Meda Klinefelter M.D.   On: 08/22/2022 13:09   CT Head Wo Contrast  Result Date: 08/22/2022 CLINICAL DATA:  89 year old male TIA.  New/increasing weakness. EXAM: CT HEAD WITHOUT CONTRAST TECHNIQUE: Contiguous axial images were obtained from the base of the skull through the vertex without intravenous contrast. RADIATION DOSE REDUCTION: This exam was performed according to the departmental dose-optimization program which includes automated exposure control, adjustment of the mA and/or kV according to patient size and/or use of iterative reconstruction technique. COMPARISON:  Brain MRI 06/30/2020.  Head CT 08/03/2022. FINDINGS: Brain: Chronic small vessel disease most pronounced in the right corona radiata and bilateral basal ganglia. Small chronic right cerebellar infarct. Stable gray-white matter differentiation throughout the brain. No midline shift, ventriculomegaly, mass effect, evidence of mass lesion, intracranial hemorrhage or evidence of cortically based acute infarction. Vascular: Calcified atherosclerosis at the skull base. No suspicious intracranial vascular hyperdensity. Skull: No acute osseous abnormality identified. Sinuses/Orbits: Stable paranasal sinuses. Chronic posterior right ethmoid opacification but other Visualized paranasal sinuses and mastoids are stable and well aerated. Other: No acute orbit or scalp soft tissue finding. IMPRESSION: No acute intracranial abnormality. Stable non contrast CT appearance of chronic small vessel ischemia. Electronically Signed   By: Odessa Fleming M.D.   On: 08/22/2022 09:37   DG Chest Port 1 View  Result Date: 08/22/2022 CLINICAL DATA:  Weakness EXAM: PORTABLE CHEST 1 VIEW COMPARISON:  05/03/2022 FINDINGS: The heart size and mediastinal contours are within normal limits. Aortic atherosclerosis. Post treatment changes within the left suprahilar region. Streaky opacity within the right mid lung. No pleural  effusion or pneumothorax. The visualized skeletal structures are unremarkable. IMPRESSION: Streaky opacity within the right mid lung, atelectasis versus infiltrate. Electronically Signed   By: Duanne Guess D.O.   On: 08/22/2022 09:20    Procedures Procedures    Medications Ordered in ED Medications - No data to display  ED Course/ Medical Decision Making/ A&P                             Medical Decision Making Amount  and/or Complexity of Data Reviewed Labs: ordered. Radiology: ordered. ECG/medicine tests: ordered.  Risk Prescription drug management.  This patient presents to the ED for concern of weakness, this involves an extensive number of treatment options, and is a complaint that carries with it a high risk of complications and morbidity.  The differential diagnosis includes dehydration, stroke   Co morbidities that complicate the patient evaluation  Hypertension and obesity   Additional history obtained:  Additional history obtained from family External records from outside source obtained and reviewed including hospital records   Lab Tests:  I Ordered, and personally interpreted labs.  The pertinent results include: Sodium 125, urinalysis shows large leukocytes and 6-10 WBCs  Imaging Studies ordered:  I ordered imaging studies including CT head and right knee I independently visualized and interpreted imaging which showed head CT shows no acute changes, right knee shows tripe compartment syndrome from degenerative changes I agree with the radiologist interpretation   Cardiac Monitoring: / EKG:  The patient was maintained on a cardiac monitor.  I personally viewed and interpreted the cardiac monitored which showed an underlying rhythm of: Normal sinus rhythm   Consultations Obtained:  No consultant/ ED Course / Critical interventions / Medication management  Urinary tract infection and hyponatremia I ordered medication including normal saline and  Keflex Reevaluation of the patient after these medicines showed that the patient improved I have reviewed the patients home medicines and have made adjustments as needed   Social Determinants of Health:  None   Test / Admission - Considered:  None  Patient with weakness and possible UTI and mild hyponatremia with severe degenerative changes in his right knee.  Patient sent home on Keflex and will follow-up with PCP        Final Clinical Impression(s) / ED Diagnoses Final diagnoses:  Weakness  Acute cystitis without hematuria  Hyponatremia    Rx / DC Orders ED Discharge Orders          Ordered    cephALEXin (KEFLEX) 500 MG capsule  4 times daily        08/22/22 1517              Bethann Berkshire, MD 08/23/22 1125

## 2022-08-23 NOTE — Telephone Encounter (Signed)
Molli Knock can we go ahead and get the FL 2 started for this patient and we may have to call and ask what facility they want to go forward with.

## 2022-08-24 ENCOUNTER — Telehealth: Payer: Self-pay | Admitting: Family Medicine

## 2022-08-24 DIAGNOSIS — M1711 Unilateral primary osteoarthritis, right knee: Secondary | ICD-10-CM | POA: Diagnosis not present

## 2022-08-24 DIAGNOSIS — M6281 Muscle weakness (generalized): Secondary | ICD-10-CM | POA: Diagnosis not present

## 2022-08-24 DIAGNOSIS — R269 Unspecified abnormalities of gait and mobility: Secondary | ICD-10-CM | POA: Diagnosis not present

## 2022-08-24 DIAGNOSIS — I69398 Other sequelae of cerebral infarction: Secondary | ICD-10-CM | POA: Diagnosis not present

## 2022-08-24 DIAGNOSIS — E871 Hypo-osmolality and hyponatremia: Secondary | ICD-10-CM | POA: Diagnosis not present

## 2022-08-24 DIAGNOSIS — I10 Essential (primary) hypertension: Secondary | ICD-10-CM | POA: Diagnosis not present

## 2022-08-24 NOTE — Telephone Encounter (Signed)
Pt son wants to have more home health, wants to hold off on the FL2 Pt needs more extensive care like bathing assistance he can not get in shower anymore

## 2022-08-24 NOTE — Progress Notes (Signed)
Hospital bed order faxed to [facility]

## 2022-08-25 DIAGNOSIS — M1711 Unilateral primary osteoarthritis, right knee: Secondary | ICD-10-CM | POA: Diagnosis not present

## 2022-08-25 DIAGNOSIS — I10 Essential (primary) hypertension: Secondary | ICD-10-CM | POA: Diagnosis not present

## 2022-08-25 DIAGNOSIS — M6281 Muscle weakness (generalized): Secondary | ICD-10-CM | POA: Diagnosis not present

## 2022-08-25 DIAGNOSIS — I69398 Other sequelae of cerebral infarction: Secondary | ICD-10-CM | POA: Diagnosis not present

## 2022-08-25 DIAGNOSIS — R269 Unspecified abnormalities of gait and mobility: Secondary | ICD-10-CM | POA: Diagnosis not present

## 2022-08-25 DIAGNOSIS — E871 Hypo-osmolality and hyponatremia: Secondary | ICD-10-CM | POA: Diagnosis not present

## 2022-08-25 NOTE — Telephone Encounter (Signed)
Okay that is fine to have that decision, we have already made up the FL 2 and we will keep it on file.  Will have our person go ahead and contact home health and get the more assistance and more home stuff that we had initially intended.  I believe that they were already with [facility], MD [facility] 08/25/2022, 7:53 AM

## 2022-08-25 NOTE — Telephone Encounter (Signed)
Hospital bed & shower chair faxed to [facility] yesterday

## 2022-08-27 ENCOUNTER — Telehealth: Payer: Self-pay | Admitting: *Deleted

## 2022-08-27 NOTE — Telephone Encounter (Signed)
Transition Care Management Follow-up Telephone Call Date of discharge and from where: [name] 08/22/2022 How have you been since you were released from the hospital? Patient still very weak  Any questions or concerns? No  Items Reviewed: Did the pt receive and understand the discharge instructions provided? Yes  Medications obtained and verified? No  Other? Yes  Any new allergies since your discharge? No  Dietary orders reviewed? No Do you have support at home? Yes  Wife , and son and daughter    Follow up appointments reviewed:  PCP Hospital f/u appt confirmed? Yes   had a video appt and patient has family support family staying with him also emailed son resources for care giving assistance  . Are transportation arrangements needed? No  family takes turns if he needs it  If their condition worsens, is the pt aware to call PCP or go to the Emergency Dept.? Yes Was the patient provided with contact information for the PCP's office or ED? Yes Was to pt encouraged to call back with questions or concerns? Yes

## 2022-08-27 NOTE — Telephone Encounter (Signed)
Patient is established with [facility] at this time.

## 2022-08-31 DIAGNOSIS — I69398 Other sequelae of cerebral infarction: Secondary | ICD-10-CM | POA: Diagnosis not present

## 2022-08-31 DIAGNOSIS — E871 Hypo-osmolality and hyponatremia: Secondary | ICD-10-CM | POA: Diagnosis not present

## 2022-08-31 DIAGNOSIS — R269 Unspecified abnormalities of gait and mobility: Secondary | ICD-10-CM | POA: Diagnosis not present

## 2022-08-31 DIAGNOSIS — I10 Essential (primary) hypertension: Secondary | ICD-10-CM | POA: Diagnosis not present

## 2022-08-31 DIAGNOSIS — M1711 Unilateral primary osteoarthritis, right knee: Secondary | ICD-10-CM | POA: Diagnosis not present

## 2022-08-31 DIAGNOSIS — M6281 Muscle weakness (generalized): Secondary | ICD-10-CM | POA: Diagnosis not present

## 2022-09-01 DIAGNOSIS — M6281 Muscle weakness (generalized): Secondary | ICD-10-CM | POA: Diagnosis not present

## 2022-09-01 DIAGNOSIS — I69398 Other sequelae of cerebral infarction: Secondary | ICD-10-CM | POA: Diagnosis not present

## 2022-09-01 DIAGNOSIS — I10 Essential (primary) hypertension: Secondary | ICD-10-CM | POA: Diagnosis not present

## 2022-09-01 DIAGNOSIS — R269 Unspecified abnormalities of gait and mobility: Secondary | ICD-10-CM | POA: Diagnosis not present

## 2022-09-01 DIAGNOSIS — E871 Hypo-osmolality and hyponatremia: Secondary | ICD-10-CM | POA: Diagnosis not present

## 2022-09-01 DIAGNOSIS — M1711 Unilateral primary osteoarthritis, right knee: Secondary | ICD-10-CM | POA: Diagnosis not present

## 2022-09-02 NOTE — Telephone Encounter (Signed)
I have LM for [facility] to inquire if they can add Aide to order (typically these are an out of pocket expense due to [facility] not offering this Service).

## 2022-09-03 NOTE — Telephone Encounter (Signed)
Okay thanks, I think it might be a challenge to push for it but let me know what the final result ends up being and then we can let the family know

## 2022-09-05 ENCOUNTER — Encounter (HOSPITAL_BASED_OUTPATIENT_CLINIC_OR_DEPARTMENT_OTHER): Payer: Self-pay

## 2022-09-05 ENCOUNTER — Other Ambulatory Visit: Payer: Self-pay

## 2022-09-05 ENCOUNTER — Inpatient Hospital Stay (HOSPITAL_BASED_OUTPATIENT_CLINIC_OR_DEPARTMENT_OTHER)
Admission: EM | Admit: 2022-09-05 | Discharge: 2022-09-14 | DRG: 947 | Disposition: A | Discharge status: Home Health | Payer: Medicare Other | Attending: Internal Medicine | Admitting: Internal Medicine

## 2022-09-05 DIAGNOSIS — N3041 Irradiation cystitis with hematuria: Secondary | ICD-10-CM | POA: Diagnosis present

## 2022-09-05 DIAGNOSIS — K624 Stenosis of anus and rectum: Secondary | ICD-10-CM | POA: Diagnosis present

## 2022-09-05 DIAGNOSIS — R319 Hematuria, unspecified: Secondary | ICD-10-CM | POA: Diagnosis not present

## 2022-09-05 DIAGNOSIS — M1712 Unilateral primary osteoarthritis, left knee: Secondary | ICD-10-CM | POA: Diagnosis not present

## 2022-09-05 DIAGNOSIS — Z87891 Personal history of nicotine dependence: Secondary | ICD-10-CM

## 2022-09-05 DIAGNOSIS — Y842 Radiological procedure and radiotherapy as the cause of abnormal reaction of the patient, or of later complication, without mention of misadventure at the time of the procedure: Secondary | ICD-10-CM | POA: Diagnosis present

## 2022-09-05 DIAGNOSIS — E782 Mixed hyperlipidemia: Secondary | ICD-10-CM | POA: Diagnosis not present

## 2022-09-05 DIAGNOSIS — Z9841 Cataract extraction status, right eye: Secondary | ICD-10-CM | POA: Diagnosis not present

## 2022-09-05 DIAGNOSIS — Z66 Do not resuscitate: Secondary | ICD-10-CM | POA: Diagnosis present

## 2022-09-05 DIAGNOSIS — K631 Perforation of intestine (nontraumatic): Secondary | ICD-10-CM | POA: Diagnosis present

## 2022-09-05 DIAGNOSIS — R404 Transient alteration of awareness: Secondary | ICD-10-CM | POA: Diagnosis not present

## 2022-09-05 DIAGNOSIS — E039 Hypothyroidism, unspecified: Secondary | ICD-10-CM | POA: Diagnosis present

## 2022-09-05 DIAGNOSIS — M6289 Other specified disorders of muscle: Secondary | ICD-10-CM | POA: Diagnosis present

## 2022-09-05 DIAGNOSIS — E785 Hyperlipidemia, unspecified: Secondary | ICD-10-CM | POA: Diagnosis present

## 2022-09-05 DIAGNOSIS — Z820 Family history of epilepsy and other diseases of the nervous system: Secondary | ICD-10-CM

## 2022-09-05 DIAGNOSIS — I700 Atherosclerosis of aorta: Secondary | ICD-10-CM | POA: Diagnosis present

## 2022-09-05 DIAGNOSIS — Z79899 Other long term (current) drug therapy: Secondary | ICD-10-CM

## 2022-09-05 DIAGNOSIS — R531 Weakness: Principal | ICD-10-CM

## 2022-09-05 DIAGNOSIS — C61 Malignant neoplasm of prostate: Secondary | ICD-10-CM | POA: Diagnosis present

## 2022-09-05 DIAGNOSIS — Z9079 Acquired absence of other genital organ(s): Secondary | ICD-10-CM

## 2022-09-05 DIAGNOSIS — Z7982 Long term (current) use of aspirin: Secondary | ICD-10-CM

## 2022-09-05 DIAGNOSIS — Z85118 Personal history of other malignant neoplasm of bronchus and lung: Secondary | ICD-10-CM

## 2022-09-05 DIAGNOSIS — B9620 Unspecified Escherichia coli [E. coli] as the cause of diseases classified elsewhere: Secondary | ICD-10-CM | POA: Diagnosis present

## 2022-09-05 DIAGNOSIS — K5790 Diverticulosis of intestine, part unspecified, without perforation or abscess without bleeding: Secondary | ICD-10-CM | POA: Diagnosis not present

## 2022-09-05 DIAGNOSIS — Z923 Personal history of irradiation: Secondary | ICD-10-CM

## 2022-09-05 DIAGNOSIS — Z8042 Family history of malignant neoplasm of prostate: Secondary | ICD-10-CM

## 2022-09-05 DIAGNOSIS — H8110 Benign paroxysmal vertigo, unspecified ear: Secondary | ICD-10-CM | POA: Diagnosis not present

## 2022-09-05 DIAGNOSIS — U071 COVID-19: Secondary | ICD-10-CM | POA: Diagnosis present

## 2022-09-05 DIAGNOSIS — Z8249 Family history of ischemic heart disease and other diseases of the circulatory system: Secondary | ICD-10-CM

## 2022-09-05 DIAGNOSIS — E441 Mild protein-calorie malnutrition: Secondary | ICD-10-CM | POA: Diagnosis present

## 2022-09-05 DIAGNOSIS — Z79890 Hormone replacement therapy (postmenopausal): Secondary | ICD-10-CM | POA: Diagnosis not present

## 2022-09-05 DIAGNOSIS — F0390 Unspecified dementia without behavioral disturbance: Secondary | ICD-10-CM | POA: Diagnosis not present

## 2022-09-05 DIAGNOSIS — R41841 Cognitive communication deficit: Secondary | ICD-10-CM | POA: Diagnosis present

## 2022-09-05 DIAGNOSIS — K5730 Diverticulosis of large intestine without perforation or abscess without bleeding: Secondary | ICD-10-CM | POA: Diagnosis not present

## 2022-09-05 DIAGNOSIS — E871 Hypo-osmolality and hyponatremia: Secondary | ICD-10-CM | POA: Diagnosis not present

## 2022-09-05 DIAGNOSIS — E559 Vitamin D deficiency, unspecified: Secondary | ICD-10-CM | POA: Diagnosis not present

## 2022-09-05 DIAGNOSIS — I10 Essential (primary) hypertension: Secondary | ICD-10-CM | POA: Diagnosis not present

## 2022-09-05 DIAGNOSIS — R1311 Dysphagia, oral phase: Secondary | ICD-10-CM | POA: Diagnosis present

## 2022-09-05 DIAGNOSIS — Z841 Family history of disorders of kidney and ureter: Secondary | ICD-10-CM

## 2022-09-05 DIAGNOSIS — I6381 Other cerebral infarction due to occlusion or stenosis of small artery: Secondary | ICD-10-CM | POA: Diagnosis present

## 2022-09-05 DIAGNOSIS — R269 Unspecified abnormalities of gait and mobility: Secondary | ICD-10-CM | POA: Diagnosis not present

## 2022-09-05 DIAGNOSIS — Z8673 Personal history of transient ischemic attack (TIA), and cerebral infarction without residual deficits: Secondary | ICD-10-CM | POA: Diagnosis not present

## 2022-09-05 DIAGNOSIS — R918 Other nonspecific abnormal finding of lung field: Secondary | ICD-10-CM | POA: Diagnosis present

## 2022-09-05 DIAGNOSIS — C3490 Malignant neoplasm of unspecified part of unspecified bronchus or lung: Secondary | ICD-10-CM | POA: Diagnosis present

## 2022-09-05 DIAGNOSIS — Z961 Presence of intraocular lens: Secondary | ICD-10-CM | POA: Diagnosis present

## 2022-09-05 DIAGNOSIS — Z87440 Personal history of urinary (tract) infections: Secondary | ICD-10-CM

## 2022-09-05 DIAGNOSIS — N400 Enlarged prostate without lower urinary tract symptoms: Secondary | ICD-10-CM | POA: Diagnosis present

## 2022-09-05 DIAGNOSIS — Z823 Family history of stroke: Secondary | ICD-10-CM

## 2022-09-05 DIAGNOSIS — M6281 Muscle weakness (generalized): Secondary | ICD-10-CM | POA: Diagnosis not present

## 2022-09-05 DIAGNOSIS — Z79818 Long term (current) use of other agents affecting estrogen receptors and estrogen levels: Secondary | ICD-10-CM

## 2022-09-05 DIAGNOSIS — R159 Full incontinence of feces: Secondary | ICD-10-CM | POA: Diagnosis present

## 2022-09-05 DIAGNOSIS — R339 Retention of urine, unspecified: Secondary | ICD-10-CM | POA: Diagnosis not present

## 2022-09-05 DIAGNOSIS — M25562 Pain in left knee: Secondary | ICD-10-CM | POA: Diagnosis not present

## 2022-09-05 DIAGNOSIS — Z7401 Bed confinement status: Secondary | ICD-10-CM | POA: Diagnosis not present

## 2022-09-05 DIAGNOSIS — K219 Gastro-esophageal reflux disease without esophagitis: Secondary | ICD-10-CM | POA: Diagnosis present

## 2022-09-05 DIAGNOSIS — R310 Gross hematuria: Secondary | ICD-10-CM | POA: Diagnosis not present

## 2022-09-05 DIAGNOSIS — Z885 Allergy status to narcotic agent status: Secondary | ICD-10-CM

## 2022-09-05 DIAGNOSIS — R32 Unspecified urinary incontinence: Secondary | ICD-10-CM | POA: Diagnosis not present

## 2022-09-05 DIAGNOSIS — R42 Dizziness and giddiness: Secondary | ICD-10-CM | POA: Diagnosis present

## 2022-09-05 DIAGNOSIS — M1711 Unilateral primary osteoarthritis, right knee: Secondary | ICD-10-CM | POA: Diagnosis present

## 2022-09-05 DIAGNOSIS — Q53112 Unilateral inguinal testis: Secondary | ICD-10-CM | POA: Diagnosis not present

## 2022-09-05 DIAGNOSIS — I69398 Other sequelae of cerebral infarction: Secondary | ICD-10-CM | POA: Diagnosis not present

## 2022-09-05 DIAGNOSIS — N390 Urinary tract infection, site not specified: Secondary | ICD-10-CM | POA: Diagnosis present

## 2022-09-05 LAB — CBC WITH DIFFERENTIAL/PLATELET
Abs Immature Granulocytes: 0.04 10*3/uL (ref 0.00–0.07)
Basophils Absolute: 0 10*3/uL (ref 0.0–0.1)
Basophils Relative: 0 %
Eosinophils Absolute: 0.3 10*3/uL (ref 0.0–0.5)
Eosinophils Relative: 4 %
HCT: 34.4 % — ABNORMAL LOW (ref 39.0–52.0)
Hemoglobin: 11.4 g/dL — ABNORMAL LOW (ref 13.0–17.0)
Immature Granulocytes: 1 %
Lymphocytes Relative: 16 %
Lymphs Abs: 1.2 10*3/uL (ref 0.7–4.0)
MCH: 30.1 pg (ref 26.0–34.0)
MCHC: 33.1 g/dL (ref 30.0–36.0)
MCV: 90.8 fL (ref 80.0–100.0)
Monocytes Absolute: 0.6 10*3/uL (ref 0.1–1.0)
Monocytes Relative: 7 %
Neutro Abs: 5.4 10*3/uL (ref 1.7–7.7)
Neutrophils Relative %: 72 %
Platelets: 354 10*3/uL (ref 150–400)
RBC: 3.79 MIL/uL — ABNORMAL LOW (ref 4.22–5.81)
RDW: 15.3 % (ref 11.5–15.5)
WBC: 7.6 10*3/uL (ref 4.0–10.5)
nRBC: 0 % (ref 0.0–0.2)

## 2022-09-05 LAB — BASIC METABOLIC PANEL
Anion gap: 6 (ref 5–15)
BUN: 16 mg/dL (ref 8–23)
CO2: 25 mmol/L (ref 22–32)
Calcium: 9.5 mg/dL (ref 8.9–10.3)
Chloride: 96 mmol/L — ABNORMAL LOW (ref 98–111)
Creatinine, Ser: 0.65 mg/dL (ref 0.61–1.24)
GFR, Estimated: >60 mL/min (ref >60)
Glucose, Bld: 105 mg/dL — ABNORMAL HIGH (ref 70–99)
Potassium: 4.3 mmol/L (ref 3.5–5.1)
Sodium: 127 mmol/L — ABNORMAL LOW (ref 135–145)

## 2022-09-05 LAB — URINALYSIS, ROUTINE W REFLEX MICROSCOPIC: WBC, UA: >50 WBC/hpf (ref 0–5)

## 2022-09-05 NOTE — ED Notes (Signed)
Pt unable to ambulate independently. Was 2 person assist to get out of bed and stand. Unable to ambulate safely  MD aware

## 2022-09-05 NOTE — ED Provider Notes (Signed)
[place] EMERGENCY DEPARTMENT AT [facility] Provider Note   CSN: [id number] Arrival date & time: 09/05/22  1559     History  Chief Complaint  Patient presents with   Hematuria    [name] is a 89 y.o. male.  HPI   89 year old male presents emergency department with concern for blood in urine.  He takes a daily aspirin.  He is currently incontinent of urine and stool.  Was recently treated with UTI (Keflex regimen that completed 2 days ago) and over the last couple days has noted bloody urine.  No mention of bloody stools or bleeding from the rectum.  No fever or chills.  No suprapubic/flank pain.  No symptoms of anemia, shortness of breath, lightheadedness.  Patient has baseline generalized weakness and does get home PT.  Home Medications Prior to Admission medications   Medication Sig Start Date End Date Taking? Authorizing Provider  amLODipine (NORVASC) 10 MG tablet Take 1 tablet (10 mg total) by mouth daily. 08/19/21   Mechele Claude, MD  aspirin 325 MG tablet Take 1 tablet (325 mg total) by mouth daily. 04/05/16   Rama, Maryruth Bun, MD  atorvastatin (LIPITOR) 40 MG tablet Take 1 tablet by mouth once daily 06/14/22   Mechele Claude, MD  azelastine (ASTELIN) 0.1 % nasal spray Place 2 sprays into both nostrils 2 (two) times daily. Use in each nostril as directed 02/22/22   Mechele Claude, MD  cephALEXin (KEFLEX) 500 MG capsule Take 1 capsule (500 mg total) by mouth 4 (four) times daily. 08/22/22   Bethann Berkshire, MD  Cholecalciferol (VITAMIN D3) 5000 UNITS CAPS Take 1 capsule by mouth daily.    [provider]  enzalutamide Diana Eves) 40 MG tablet Take 160 mg by mouth daily.    [provider]  Leuprolide Acetate, 6 Month, (LUPRON) 45 MG injection Inject 45 mg into the muscle every 6 (six) months.    [provider]  levothyroxine (SYNTHROID) 125 MCG tablet Take 1 tablet (125 mcg total) by mouth daily before breakfast. 04/15/22   Mechele Claude,  MD  lisinopril (ZESTRIL) 40 MG tablet Take 1 tablet by mouth once daily 06/14/22   Mechele Claude, MD  loratadine (CLARITIN) 10 MG tablet Take 10 mg by mouth daily.    [provider]  Magnesium 300 MG CAPS Take 1 capsule by mouth daily.    [provider]  Multiple Vitamins-Minerals (CENTRUM SILVER ULTRA MENS PO) Take 1 tablet by mouth daily.    [provider]  pantoprazole (PROTONIX) 20 MG tablet Take 1 tablet (20 mg total) by mouth daily. For stomach 06/23/22   Mechele Claude, MD  Probiotic Product (PROBIOTIC DAILY PO) Take 1 capsule by mouth daily.    [provider]  sodium chloride (OCEAN) 0.65 % SOLN nasal spray Place 1 spray into both nostrils as needed for congestion.    [provider]      Allergies    Caffeine and Codeine    Review of Systems   Review of Systems  Constitutional:  Negative for chills and fever.  Respiratory:  Negative for shortness of breath.   Cardiovascular:  Negative for chest pain.  Gastrointestinal:  Negative for abdominal pain, diarrhea and vomiting.  Genitourinary:  Positive for hematuria. Negative for difficulty urinating, flank pain, genital sores, penile pain, penile swelling, scrotal swelling and testicular pain.       + incontinence   Skin:  Negative for rash.  Neurological:  Negative for headaches.  Physical Exam Updated Vital Signs BP (!) 156/66   Pulse 71   Temp (!) 97.5 F (36.4 C)   Resp 18   SpO2 98%  Physical Exam Vitals and nursing note reviewed.  Constitutional:      General: He is not in acute distress.    Appearance: Normal appearance. He is not ill-appearing.  HENT:     Head: Normocephalic.     Mouth/Throat:     Mouth: Mucous membranes are moist.  Cardiovascular:     Rate and Rhythm: Normal rate.  Pulmonary:     Effort: Pulmonary effort is normal. No respiratory distress.  Abdominal:     Palpations: Abdomen is soft.     Tenderness: There is no abdominal tenderness.   Genitourinary:    Comments: Unremarkable penis and testes, clear yellow urine expressed, depends is soaked with what appears to be bloody urine.  Chaperoned rectal exam shows brown stool with no clots or bleeding coming from the rectum Skin:    General: Skin is warm.  Neurological:     Mental Status: He is alert and oriented to person, place, and time. Mental status is at baseline.  Psychiatric:        Mood and Affect: Mood normal.     ED Results / Procedures / Treatments   Labs (all labs ordered are listed, but only abnormal results are displayed) Labs Reviewed  CBC WITH DIFFERENTIAL/PLATELET - Abnormal; Notable for the following components:      Result Value   RBC 3.79 (*)    Hemoglobin 11.4 (*)    HCT 34.4 (*)    All other components within normal limits  BASIC METABOLIC PANEL - Abnormal; Notable for the following components:   Sodium 127 (*)    Chloride 96 (*)    Glucose, Bld 105 (*)    All other components within normal limits  URINALYSIS, ROUTINE W REFLEX MICROSCOPIC    EKG None  Radiology No results found.  Procedures Procedures    Medications Ordered in ED Medications - No data to display  ED Course/ Medical Decision Making/ A&P                                 Medical Decision Making Amount and/or Complexity of Data Reviewed Labs: ordered.  Risk Decision regarding hospitalization.   89 year old male presents emergency department with concern for hematuria.  He is incontinent of urine and stool and wears depends.  Vitals are normal stable on arrival.  Besides baby aspirin he is not anticoagulated.  On exam the depends is saturated with blood/clots.  He does express clear urine.  Bladder scan shows no significant retention.  Rectal exam shows light brown stool, no clots or evidence of bleeding from the rectum.  Blood work is reassuring, hemoglobin stable, kidney function is normal.  We did an In-N-Out catheter that revealed hematuria with clots.   Urinalysis shows blood and unable to see other markers.  On review from his previous visit [facility], urine culture did not grow any specific bacteria.  He completed Keflex.  Low suspicion for UTI at this time.  [facility].  When discussing outpatient follow-up family has concern for weakness which is what he was originally seen in any [facility] for 2 weeks ago.  We had our staff try to get him up and he is a 2-3 person assist and is unable to manage at home and  would not be successful with outpatient follow-up with urology.  For this reason, inability to ambulate, patient will be admitted with hematuria and weakness.  Patients evaluation and results requires admission for further treatment and care.  Spoke with hospitalist, reviewed patient's ED course and they accept admission.  Patient agrees with admission plan, offers no new complaints and is stable/unchanged at time of admit.        Final Clinical Impression(s) / ED Diagnoses Final diagnoses:  None    Rx / DC Orders ED Discharge Orders     None         Rozelle Logan, DO 09/05/22 2347

## 2022-09-05 NOTE — ED Notes (Signed)
Pt's wife states that pt is incontinent & unable to provide urine sample at this time

## 2022-09-05 NOTE — ED Notes (Signed)
Gave patient ice water with approval from Doctor.

## 2022-09-05 NOTE — ED Triage Notes (Signed)
Pt c/o bleeding from penis- "was mixed w his urine but now it looks like just blood." Family states he was dx w UTI approx 2wks ago, finished abx

## 2022-09-05 NOTE — ED Notes (Signed)
Report given to the next RN... 

## 2022-09-06 ENCOUNTER — Inpatient Hospital Stay (HOSPITAL_COMMUNITY): Payer: Medicare Other

## 2022-09-06 DIAGNOSIS — Y842 Radiological procedure and radiotherapy as the cause of abnormal reaction of the patient, or of later complication, without mention of misadventure at the time of the procedure: Secondary | ICD-10-CM | POA: Diagnosis present

## 2022-09-06 DIAGNOSIS — M1712 Unilateral primary osteoarthritis, left knee: Secondary | ICD-10-CM | POA: Diagnosis not present

## 2022-09-06 DIAGNOSIS — E441 Mild protein-calorie malnutrition: Secondary | ICD-10-CM | POA: Diagnosis present

## 2022-09-06 DIAGNOSIS — R404 Transient alteration of awareness: Secondary | ICD-10-CM | POA: Diagnosis not present

## 2022-09-06 DIAGNOSIS — I69398 Other sequelae of cerebral infarction: Secondary | ICD-10-CM | POA: Diagnosis not present

## 2022-09-06 DIAGNOSIS — E782 Mixed hyperlipidemia: Secondary | ICD-10-CM | POA: Diagnosis not present

## 2022-09-06 DIAGNOSIS — R1311 Dysphagia, oral phase: Secondary | ICD-10-CM | POA: Diagnosis present

## 2022-09-06 DIAGNOSIS — I10 Essential (primary) hypertension: Secondary | ICD-10-CM | POA: Diagnosis present

## 2022-09-06 DIAGNOSIS — Z79890 Hormone replacement therapy (postmenopausal): Secondary | ICD-10-CM | POA: Diagnosis not present

## 2022-09-06 DIAGNOSIS — Z961 Presence of intraocular lens: Secondary | ICD-10-CM | POA: Diagnosis present

## 2022-09-06 DIAGNOSIS — M6289 Other specified disorders of muscle: Secondary | ICD-10-CM | POA: Diagnosis present

## 2022-09-06 DIAGNOSIS — Z7401 Bed confinement status: Secondary | ICD-10-CM | POA: Diagnosis not present

## 2022-09-06 DIAGNOSIS — R918 Other nonspecific abnormal finding of lung field: Secondary | ICD-10-CM | POA: Diagnosis present

## 2022-09-06 DIAGNOSIS — N3041 Irradiation cystitis with hematuria: Secondary | ICD-10-CM | POA: Diagnosis present

## 2022-09-06 DIAGNOSIS — R531 Weakness: Secondary | ICD-10-CM | POA: Diagnosis present

## 2022-09-06 DIAGNOSIS — I700 Atherosclerosis of aorta: Secondary | ICD-10-CM | POA: Diagnosis present

## 2022-09-06 DIAGNOSIS — C3490 Malignant neoplasm of unspecified part of unspecified bronchus or lung: Secondary | ICD-10-CM | POA: Diagnosis present

## 2022-09-06 DIAGNOSIS — U071 COVID-19: Secondary | ICD-10-CM | POA: Diagnosis present

## 2022-09-06 DIAGNOSIS — K631 Perforation of intestine (nontraumatic): Secondary | ICD-10-CM | POA: Diagnosis present

## 2022-09-06 DIAGNOSIS — E785 Hyperlipidemia, unspecified: Secondary | ICD-10-CM | POA: Diagnosis present

## 2022-09-06 DIAGNOSIS — M6281 Muscle weakness (generalized): Secondary | ICD-10-CM | POA: Diagnosis present

## 2022-09-06 DIAGNOSIS — K219 Gastro-esophageal reflux disease without esophagitis: Secondary | ICD-10-CM | POA: Diagnosis present

## 2022-09-06 DIAGNOSIS — M25562 Pain in left knee: Secondary | ICD-10-CM | POA: Diagnosis not present

## 2022-09-06 DIAGNOSIS — Z87891 Personal history of nicotine dependence: Secondary | ICD-10-CM | POA: Diagnosis not present

## 2022-09-06 DIAGNOSIS — Z9841 Cataract extraction status, right eye: Secondary | ICD-10-CM | POA: Diagnosis not present

## 2022-09-06 DIAGNOSIS — R339 Retention of urine, unspecified: Secondary | ICD-10-CM | POA: Diagnosis not present

## 2022-09-06 DIAGNOSIS — Z85118 Personal history of other malignant neoplasm of bronchus and lung: Secondary | ICD-10-CM | POA: Diagnosis not present

## 2022-09-06 DIAGNOSIS — N390 Urinary tract infection, site not specified: Secondary | ICD-10-CM | POA: Diagnosis present

## 2022-09-06 DIAGNOSIS — R269 Unspecified abnormalities of gait and mobility: Secondary | ICD-10-CM | POA: Diagnosis not present

## 2022-09-06 DIAGNOSIS — B9620 Unspecified Escherichia coli [E. coli] as the cause of diseases classified elsewhere: Secondary | ICD-10-CM | POA: Diagnosis present

## 2022-09-06 DIAGNOSIS — R319 Hematuria, unspecified: Secondary | ICD-10-CM | POA: Diagnosis not present

## 2022-09-06 DIAGNOSIS — E039 Hypothyroidism, unspecified: Secondary | ICD-10-CM | POA: Diagnosis present

## 2022-09-06 DIAGNOSIS — E559 Vitamin D deficiency, unspecified: Secondary | ICD-10-CM | POA: Diagnosis not present

## 2022-09-06 DIAGNOSIS — R32 Unspecified urinary incontinence: Secondary | ICD-10-CM | POA: Diagnosis not present

## 2022-09-06 DIAGNOSIS — R42 Dizziness and giddiness: Secondary | ICD-10-CM | POA: Diagnosis present

## 2022-09-06 DIAGNOSIS — Z8673 Personal history of transient ischemic attack (TIA), and cerebral infarction without residual deficits: Secondary | ICD-10-CM | POA: Diagnosis not present

## 2022-09-06 DIAGNOSIS — Z8042 Family history of malignant neoplasm of prostate: Secondary | ICD-10-CM | POA: Diagnosis not present

## 2022-09-06 DIAGNOSIS — R310 Gross hematuria: Secondary | ICD-10-CM | POA: Diagnosis not present

## 2022-09-06 DIAGNOSIS — Z79818 Long term (current) use of other agents affecting estrogen receptors and estrogen levels: Secondary | ICD-10-CM | POA: Diagnosis not present

## 2022-09-06 DIAGNOSIS — M1711 Unilateral primary osteoarthritis, right knee: Secondary | ICD-10-CM | POA: Diagnosis present

## 2022-09-06 DIAGNOSIS — K624 Stenosis of anus and rectum: Secondary | ICD-10-CM | POA: Diagnosis present

## 2022-09-06 DIAGNOSIS — Q53112 Unilateral inguinal testis: Secondary | ICD-10-CM | POA: Diagnosis not present

## 2022-09-06 DIAGNOSIS — F0390 Unspecified dementia without behavioral disturbance: Secondary | ICD-10-CM | POA: Diagnosis present

## 2022-09-06 DIAGNOSIS — K5730 Diverticulosis of large intestine without perforation or abscess without bleeding: Secondary | ICD-10-CM | POA: Diagnosis not present

## 2022-09-06 DIAGNOSIS — H8110 Benign paroxysmal vertigo, unspecified ear: Secondary | ICD-10-CM | POA: Diagnosis not present

## 2022-09-06 DIAGNOSIS — N400 Enlarged prostate without lower urinary tract symptoms: Secondary | ICD-10-CM | POA: Diagnosis present

## 2022-09-06 DIAGNOSIS — Z9079 Acquired absence of other genital organ(s): Secondary | ICD-10-CM | POA: Diagnosis not present

## 2022-09-06 DIAGNOSIS — Z885 Allergy status to narcotic agent status: Secondary | ICD-10-CM | POA: Diagnosis not present

## 2022-09-06 DIAGNOSIS — E871 Hypo-osmolality and hyponatremia: Secondary | ICD-10-CM | POA: Diagnosis not present

## 2022-09-06 DIAGNOSIS — K5790 Diverticulosis of intestine, part unspecified, without perforation or abscess without bleeding: Secondary | ICD-10-CM | POA: Diagnosis not present

## 2022-09-06 DIAGNOSIS — Z66 Do not resuscitate: Secondary | ICD-10-CM | POA: Diagnosis present

## 2022-09-06 DIAGNOSIS — C61 Malignant neoplasm of prostate: Secondary | ICD-10-CM | POA: Diagnosis present

## 2022-09-06 DIAGNOSIS — R41841 Cognitive communication deficit: Secondary | ICD-10-CM | POA: Diagnosis present

## 2022-09-06 DIAGNOSIS — Z923 Personal history of irradiation: Secondary | ICD-10-CM | POA: Diagnosis not present

## 2022-09-06 LAB — CBC
HCT: 36.2 % — ABNORMAL LOW (ref 39.0–52.0)
Hemoglobin: 11.4 g/dL — ABNORMAL LOW (ref 13.0–17.0)
MCH: 30.3 pg (ref 26.0–34.0)
MCHC: 31.5 g/dL (ref 30.0–36.0)
MCV: 96.3 fL (ref 80.0–100.0)
Platelets: 310 10*3/uL (ref 150–400)
RBC: 3.76 MIL/uL — ABNORMAL LOW (ref 4.22–5.81)
RDW: 15.4 % (ref 11.5–15.5)
WBC: 5.6 10*3/uL (ref 4.0–10.5)
nRBC: 0 % (ref 0.0–0.2)

## 2022-09-06 LAB — BASIC METABOLIC PANEL
Anion gap: 9 (ref 5–15)
BUN: 11 mg/dL (ref 8–23)
CO2: 23 mmol/L (ref 22–32)
Calcium: 9.4 mg/dL (ref 8.9–10.3)
Chloride: 99 mmol/L (ref 98–111)
Creatinine, Ser: 0.65 mg/dL (ref 0.61–1.24)
GFR, Estimated: >60 mL/min (ref >60)
Glucose, Bld: 99 mg/dL (ref 70–99)
Potassium: 4.3 mmol/L (ref 3.5–5.1)
Sodium: 131 mmol/L — ABNORMAL LOW (ref 135–145)

## 2022-09-06 LAB — URINALYSIS, ROUTINE W REFLEX MICROSCOPIC
Bilirubin Urine: NEGATIVE
Glucose, UA: NEGATIVE mg/dL
Ketones, ur: NEGATIVE mg/dL
Nitrite: NEGATIVE
Protein, ur: NEGATIVE mg/dL
Specific Gravity, Urine: 1.004 — ABNORMAL LOW (ref 1.005–1.030)
pH: 7 (ref 5.0–8.0)

## 2022-09-06 LAB — TSH: TSH: 2.796 u[IU]/mL (ref 0.350–4.500)

## 2022-09-06 MED ORDER — POLYETHYLENE GLYCOL 3350 17 G PO PACK: 17.0000 g | PACK | Freq: Every day | ORAL | Status: DC | PRN

## 2022-09-06 MED ORDER — ACETAMINOPHEN 325 MG PO TABS
650.0000 mg | ORAL_TABLET | Freq: Four times a day (QID) | ORAL | Status: DC | PRN
  Administered 2022-09-06 – 2022-09-13 (×4): 650 mg via ORAL
  Filled 2022-09-06 (×4): qty 2

## 2022-09-06 MED ORDER — ONDANSETRON HCL 4 MG/2ML IJ SOLN: 4.0000 mg | Freq: Four times a day (QID) | INTRAMUSCULAR | Status: DC | PRN

## 2022-09-06 MED ORDER — LISINOPRIL 20 MG PO TABS
40.0000 mg | ORAL_TABLET | Freq: Every day | ORAL | Status: DC
  Administered 2022-09-06 – 2022-09-08 (×3): 40 mg via ORAL
  Filled 2022-09-06 (×3): qty 2

## 2022-09-06 MED ORDER — METOPROLOL TARTRATE 5 MG/5ML IV SOLN: 5.0000 mg | Freq: Four times a day (QID) | INTRAVENOUS | Status: DC | PRN

## 2022-09-06 MED ORDER — ONDANSETRON HCL 4 MG PO TABS: 4.0000 mg | ORAL_TABLET | Freq: Four times a day (QID) | ORAL | Status: DC | PRN

## 2022-09-06 MED ORDER — OXYCODONE HCL 5 MG PO TABS
5.0000 mg | ORAL_TABLET | ORAL | Status: DC | PRN
  Administered 2022-09-08: 5 mg via ORAL
  Filled 2022-09-06: qty 1

## 2022-09-06 MED ORDER — LACTATED RINGERS IV SOLN: INTRAVENOUS | Status: DC

## 2022-09-06 MED ORDER — ENZALUTAMIDE 40 MG PO TABS: 160.0000 mg | ORAL_TABLET | Freq: Every day | ORAL | Status: DC

## 2022-09-06 MED ORDER — ATORVASTATIN CALCIUM 40 MG PO TABS
40.0000 mg | ORAL_TABLET | Freq: Every day | ORAL | Status: DC
  Administered 2022-09-06 – 2022-09-14 (×9): 40 mg via ORAL
  Filled 2022-09-06 (×9): qty 1

## 2022-09-06 MED ORDER — FENTANYL CITRATE PF 50 MCG/ML IJ SOSY: 12.5000 ug | PREFILLED_SYRINGE | INTRAMUSCULAR | Status: DC | PRN

## 2022-09-06 MED ORDER — ACETAMINOPHEN 650 MG RE SUPP: 650.0000 mg | Freq: Four times a day (QID) | RECTAL | Status: DC | PRN

## 2022-09-06 MED ORDER — LEVOTHYROXINE SODIUM 25 MCG PO TABS
125.0000 ug | ORAL_TABLET | Freq: Every day | ORAL | Status: DC
  Administered 2022-09-06 – 2022-09-14 (×9): 125 ug via ORAL
  Filled 2022-09-06 (×9): qty 1

## 2022-09-06 MED ORDER — AMLODIPINE BESYLATE 10 MG PO TABS
10.0000 mg | ORAL_TABLET | Freq: Every day | ORAL | Status: DC
  Administered 2022-09-06 – 2022-09-08 (×3): 10 mg via ORAL
  Filled 2022-09-06 (×3): qty 1

## 2022-09-06 MED ORDER — PANTOPRAZOLE SODIUM 20 MG PO TBEC
20.0000 mg | DELAYED_RELEASE_TABLET | Freq: Every day | ORAL | Status: DC
  Administered 2022-09-06 – 2022-09-14 (×9): 20 mg via ORAL
  Filled 2022-09-06 (×9): qty 1

## 2022-09-06 NOTE — Progress Notes (Signed)
Placed call to wife Myriam Jacobson for patient so that when she visits today she will bring him some depends.

## 2022-09-06 NOTE — Evaluation (Signed)
Physical Therapy Evaluation Patient Details Name: [name] MRN: [id number] DOB: 06/04/1932 Today's Date: 09/06/2022  History of Present Illness  [name] is a 89 y.o. male with medical history significant of   prostate cancer, HTN, HLD, hypothyroid, GERD, allergic rhinitis who presents 09/05/22  with generalized weakness at home as well as progressive hematuria over the last 2 to 3 days.  Clinical Impression  Pt admitted with above diagnosis.  Pt currently with functional limitations due to the deficits listed below (see PT Problem List). Pt will benefit from acute skilled PT to increase their independence and safety with mobility to allow discharge.   The patient is very pleasant,  somewhat confused about  place and  reason for being in hospital. The patient's family not present.  The patient was assisted to recliner after getting washed up from  [employer]  leaking.  The patient required mod assistance  for bed mobility and min assistance to stand and step to recliner.  Patient  should progress to return home with family support as ling as he has assist for ambulation.         If plan is discharge home, recommend the following: A little help with walking and/or transfers;A little help with bathing/dressing/bathroom;Assistance with cooking/housework;Assist for transportation;Help with stairs or ramp for entrance   Can travel by private vehicle        Equipment Recommendations    Recommendations for Other Services       Functional Status Assessment Patient has had a recent decline in their functional status and demonstrates the ability to make significant improvements in function in a reasonable and predictable amount of time.     Precautions / Restrictions Precautions Precautions: Fall Precaution Comments: incontinent Restrictions Weight Bearing Restrictions: No      Mobility  Bed Mobility Overal bed mobility: Needs Assistance Bed Mobility: Rolling, Sidelying to  Sit Rolling: Min assist Sidelying to sit: Min assist       General bed mobility comments: mod support to scoot to bed edge    Transfers Overall transfer level: Needs assistance Equipment used: Rolling walker (2 wheels) Transfers: Sit to/from Stand, Bed to chair/wheelchair/BSC Sit to Stand: Min assist   Step pivot transfers: Min assist       General transfer comment: cues for safety    Ambulation/Gait                  Stairs            Wheelchair Mobility     Tilt Bed    Modified Rankin (Stroke Patients Only)       Balance Overall balance assessment: Needs assistance Sitting-balance support: Bilateral upper extremity supported, Feet supported Sitting balance-Leahy Scale: Fair     Standing balance support: Bilateral upper extremity supported, During functional activity Standing balance-Leahy Scale: Fair Standing balance comment: stabilizeds on RW                             Pertinent Vitals/Pain Pain Assessment Pain Assessment: No/denies pain    Home Living Family/patient expects to be discharged to:: Private residence Living Arrangements: Spouse/significant other;Children Available Help at Discharge: Family;Available 24 hours/day Type of Home: House Home Access: Stairs to enter Entrance Stairs-Rails: Right Entrance Stairs-Number of Steps: 3   Home Layout: One level Home Equipment: Agricultural consultant (2 wheels);Cane - single point;Shower seat - built in;Grab bars - toilet;Grab bars - tub/shower;Hand held shower head Additional Comments: info  from previous encounter    Prior Function Prior Level of Function : Needs assist       Physical Assist : Mobility (physical) Mobility (physical): Transfers;Gait;Stairs   Mobility Comments: pt. reports using RW ADLs Comments: requires assistance     Extremity/Trunk Assessment   Upper Extremity Assessment Upper Extremity Assessment: Overall WFL for tasks assessed    Lower Extremity  Assessment Lower Extremity Assessment: Generalized weakness    Cervical / Trunk Assessment Cervical / Trunk Assessment: Kyphotic  Communication   Communication Communication: No apparent difficulties  Cognition Arousal: Alert Behavior During Therapy: WFL for tasks assessed/performed Overall Cognitive Status: No family/caregiver present to determine baseline cognitive functioning Area of Impairment: Orientation, Safety/judgement                 Orientation Level: Place, Time, Situation       Safety/Judgement: Decreased awareness of deficits              General Comments      Exercises     Assessment/Plan    PT Assessment Patient needs continued PT services  PT Problem List Decreased strength;Decreased mobility;Decreased safety awareness;Decreased balance       PT Treatment Interventions DME instruction;Gait training;Functional mobility training;Therapeutic exercise;Therapeutic activities;Balance training;Patient/family education;Cognitive remediation    PT Goals (Current goals can be found in the Care Plan section)  Acute Rehab PT Goals Patient Stated Goal: agreeable to get OOB PT Goal Formulation: Patient unable to participate in goal setting Time For Goal Achievement: 09/20/22 Potential to Achieve Goals: Fair    Frequency Min 1X/week     Co-evaluation               AM-PAC PT "6 Clicks" Mobility  Outcome Measure Help needed turning from your back to your side while in a flat bed without using bedrails?: A Lot Help needed moving from lying on your back to sitting on the side of a flat bed without using bedrails?: A Lot Help needed moving to and from a bed to a chair (including a wheelchair)?: A Lot Help needed standing up from a chair using your arms (e.g., wheelchair or bedside chair)?: A Lot Help needed to walk in hospital room?: Total Help needed climbing 3-5 steps with a railing? : Total 6 Click Score: 10    End of Session Equipment  Utilized During Treatment: Gait belt Activity Tolerance: Patient tolerated treatment well Patient left: in chair;with call bell/phone within reach;with chair alarm set;with nursing/sitter in room Nurse Communication: Mobility status PT Visit Diagnosis: Unsteadiness on feet (R26.81);History of falling (Z91.81);Muscle weakness (generalized) (M62.81)    Time: 1027-2536 PT Time Calculation (min) (ACUTE ONLY): 31 min   Charges:   PT Evaluation $PT Eval Low Complexity: 1 Low PT Treatments $Therapeutic Activity: 8-22 mins PT General Charges $$ ACUTE PT VISIT: 1 Visit         Blanchard Kelch PT Acute Rehabilitation Services Office [phone] Weekend [phone]   Rada Hay 09/06/2022, 12:41 PM

## 2022-09-06 NOTE — H&P (Signed)
History and Physical    Patient: [name]:[id number] DOB: Dec 04, 1932 DOA: 09/05/2022 DOS: the patient was seen and examined on 09/06/2022 PCP: Mechele Claude, MD  Patient coming from: Home  Chief Complaint:  Chief Complaint  Patient presents with   Hematuria   HPI: [name] is a 89 y.o. male with medical history significant of   prostate cancer, HTN, HLD, hypothyroid, GERD, allergic rhinitis who presents with generalized weakness at home as well as progressive hematuria over the last 2 to 3 days.  Patient reports being treated for UTI approximately 2 weeks ago completing treatment about 2 days ago.  He has noted increasing bloody urine over the last 2 to 3 days.  He denies fever or chills he denies suprapubic or flank pain.  In the ED intraoperative she was noted to require 2-3 people to help get him up and around and it was felt that outpatient evaluation of his ongoing hematuria would be unable to be completed.  We were asked to admit to continue the workup.  Urology was consulted by the EDP who asked for a nonemergent consult in the morning. Review of Systems: As mentioned in the history of present illness. All other systems reviewed and are negative. Past Medical History:  Diagnosis Date   Aphasia    Arthritis    BNC (bladder neck contracture)    BPPV (benign paroxysmal positional vertigo)    Cataract    bilateral   Diverticulosis    H/O diarrhea SECONDARY TO RADIATION THERAPY --  INTERMITANT  DIARRHEA   Hematuria    History of hemorrhagic cystitis    SECONDARY RADIATION THERAPY 1997   History of prostate cancer CURRENTLY  ELEVATED PSA--  LUPRON INJECTIONS   S/P PROSTATECTOMY AND RADIATION THERAPY  1997   Hyperlipidemia    Hypertension    Hypothyroidism    Radiation cystitis    Radiation proctitis    Urinary incontinence    Urinary retention    Vitamin D deficiency    Past Surgical History:  Procedure Laterality Date   CATARACT EXTRACTION W/ INTRAOCULAR  LENS IMPLANT Right    COLONOSCOPY W/ POLYPECTOMY  2005; 08/11/2010   2005: 1 cm hyperplastic sigmoid polyp, Dr. Laural Benes 2012: hyperplastic splenic flexure polyp -1 cm, diverticulosis, radiation proctitis, anal stenosis   CYSTO/ FULGERATION OF BLEEDERS/ RESECTION BLADDER NECK CONTRACTURE  07-06-2004   BNC AND RADIATION CYSTITIS   EYE SURGERY     KNEE ARTHROSCOPY Right    LUMBAR DISC SURGERY  1996   L4 -- L5   PROSTATECTOMY  1997   SPINE SURGERY     TRANSURETHRAL RESECTION OF BLADDER NECK N/A 06/23/2012   Procedure: TRANSURETHRAL RESECTION OF BLADDER NECK CONTRACTURE WITH GYRUS;  Surgeon: Garnett Farm, MD;  Location: [facility];  Service: Urology;  Laterality: N/A;   TRANSURETHRAL RESECTION OF BLADDER TUMOR WITH GYRUS (TURBT-GYRUS) N/A 06/01/2013   Procedure: TRANSURETHRAL RESECTION OF BLADDER NECK CONTRACTURE WITH GYRUS (TURBT-GYRUS) AND INJECTION OF KENALOG;  Surgeon: Garnett Farm, MD;  Location: [facility];  Service: Urology;  Laterality: N/A;   Social History:  reports that he quit smoking about 59 years ago. His smoking use included cigarettes. He started smoking about 79 years ago. He has been exposed to tobacco smoke. He quit smokeless tobacco use about 59 years ago.  His smokeless tobacco use included chew. He reports that he does not drink alcohol and does not use drugs.  Allergies  Allergen Reactions  Caffeine Palpitations   Codeine Palpitations    Family History  Problem Relation Age of Onset   Prostate cancer Brother    Kidney disease Brother    Congestive Heart Failure Mother    CVA Mother 50   Pancreatitis Father    Pneumonia Father    Cancer Sister        breast   Obesity Sister    Early death Sister        appendicitis   Appendicitis Sister 20       rupture   Cancer Sister        breast   Kidney disease Sister    Alzheimer's disease Brother    Hip fracture Brother     Prior to Admission medications   Medication Sig Start  Date End Date Taking? Authorizing Provider  amLODipine (NORVASC) 10 MG tablet Take 1 tablet (10 mg total) by mouth daily. 08/19/21   Mechele Claude, MD  aspirin 325 MG tablet Take 1 tablet (325 mg total) by mouth daily. 04/05/16   Rama, Maryruth Bun, MD  atorvastatin (LIPITOR) 40 MG tablet Take 1 tablet by mouth once daily 06/14/22   Mechele Claude, MD  azelastine (ASTELIN) 0.1 % nasal spray Place 2 sprays into both nostrils 2 (two) times daily. Use in each nostril as directed 02/22/22   Mechele Claude, MD  cephALEXin (KEFLEX) 500 MG capsule Take 1 capsule (500 mg total) by mouth 4 (four) times daily. 08/22/22   Bethann Berkshire, MD  Cholecalciferol (VITAMIN D3) 5000 UNITS CAPS Take 1 capsule by mouth daily.    [provider]  enzalutamide Diana Eves) 40 MG tablet Take 160 mg by mouth daily.    [provider]  Leuprolide Acetate, 6 Month, (LUPRON) 45 MG injection Inject 45 mg into the muscle every 6 (six) months.    [provider]  levothyroxine (SYNTHROID) 125 MCG tablet Take 1 tablet (125 mcg total) by mouth daily before breakfast. 04/15/22   Mechele Claude, MD  lisinopril (ZESTRIL) 40 MG tablet Take 1 tablet by mouth once daily 06/14/22   Mechele Claude, MD  loratadine (CLARITIN) 10 MG tablet Take 10 mg by mouth daily.    [provider]  Magnesium 300 MG CAPS Take 1 capsule by mouth daily.    [provider]  Multiple Vitamins-Minerals (CENTRUM SILVER ULTRA MENS PO) Take 1 tablet by mouth daily.    [provider]  pantoprazole (PROTONIX) 20 MG tablet Take 1 tablet (20 mg total) by mouth daily. For stomach 06/23/22   Mechele Claude, MD  Probiotic Product (PROBIOTIC DAILY PO) Take 1 capsule by mouth daily.    [provider]  sodium chloride (OCEAN) 0.65 % SOLN nasal spray Place 1 spray into both nostrils as needed for congestion.    [provider]    Physical Exam: Vitals:   09/05/22 2330 09/05/22 2345 09/06/22 0025 09/06/22 0032   BP: (!) 157/82 (!) 163/69  (!) 159/70  Pulse: 65 64  66  Resp:    18  Temp:    97.6 F (36.4 C)  TempSrc:    Oral  SpO2: 98% 98%  99%  Weight:   98 kg   Height:   6' (1.829 m)    Physical Examination: General appearance - chronically ill appearing Chest - clear to auscultation, no wheezes, rales or rhonchi, symmetric air entry Heart - normal rate, regular rhythm, normal S1, S2, no murmurs, rubs, clicks or gallops Abdomen - soft, nontender, nondistended, no masses  or organomegaly Extremities - pedal edema 2 +  Data Reviewed: Results for orders placed or performed during the hospital encounter of 09/05/22 (from the past 24 hour(s))  CBC with Differential     Status: Abnormal   Collection Time: 09/05/22  7:47 PM  Result Value Ref Range   WBC 7.6 4.0 - 10.5 K/uL   RBC 3.79 (L) 4.22 - 5.81 MIL/uL   Hemoglobin 11.4 (L) 13.0 - 17.0 g/dL   HCT 16.0 (L) 10.9 - 32.3 %   MCV 90.8 80.0 - 100.0 fL   MCH 30.1 26.0 - 34.0 pg   MCHC 33.1 30.0 - 36.0 g/dL   RDW 55.7 32.2 - 02.5 %   Platelets 354 150 - 400 K/uL   nRBC 0.0 0.0 - 0.2 %   Neutrophils Relative % 72 %   Neutro Abs 5.4 1.7 - 7.7 K/uL   Lymphocytes Relative 16 %   Lymphs Abs 1.2 0.7 - 4.0 K/uL   Monocytes Relative 7 %   Monocytes Absolute 0.6 0.1 - 1.0 K/uL   Eosinophils Relative 4 %   Eosinophils Absolute 0.3 0.0 - 0.5 K/uL   Basophils Relative 0 %   Basophils Absolute 0.0 0.0 - 0.1 K/uL   Immature Granulocytes 1 %   Abs Immature Granulocytes 0.04 0.00 - 0.07 K/uL  Basic metabolic panel     Status: Abnormal   Collection Time: 09/05/22  7:47 PM  Result Value Ref Range   Sodium 127 (L) 135 - 145 mmol/L   Potassium 4.3 3.5 - 5.1 mmol/L   Chloride 96 (L) 98 - 111 mmol/L   CO2 25 22 - 32 mmol/L   Glucose, Bld 105 (H) 70 - 99 mg/dL   BUN 16 8 - 23 mg/dL   Creatinine, Ser 4.27 0.61 - 1.24 mg/dL   Calcium 9.5 8.9 - 06.2 mg/dL   GFR, Estimated >37 >62 mL/min   Anion gap 6 5 - 15  Urinalysis, Routine w reflex microscopic  -Urine, Clean Catch     Status: Abnormal   Collection Time: 09/05/22  8:49 PM  Result Value Ref Range   Color, Urine RED (A) YELLOW   APPearance CLEAR CLEAR   Specific Gravity, Urine  1.005 - 1.030    TEST NOT REPORTED DUE TO COLOR INTERFERENCE OF URINE PIGMENT   pH  5.0 - 8.0    TEST NOT REPORTED DUE TO COLOR INTERFERENCE OF URINE PIGMENT   Glucose, UA (A) NEGATIVE mg/dL    TEST NOT REPORTED DUE TO COLOR INTERFERENCE OF URINE PIGMENT   Hgb urine dipstick (A) NEGATIVE    TEST NOT REPORTED DUE TO COLOR INTERFERENCE OF URINE PIGMENT   Bilirubin Urine (A) NEGATIVE    TEST NOT REPORTED DUE TO COLOR INTERFERENCE OF URINE PIGMENT   Ketones, ur (A) NEGATIVE mg/dL    TEST NOT REPORTED DUE TO COLOR INTERFERENCE OF URINE PIGMENT   Protein, ur (A) NEGATIVE mg/dL    TEST NOT REPORTED DUE TO COLOR INTERFERENCE OF URINE PIGMENT   Nitrite (A) NEGATIVE    TEST NOT REPORTED DUE TO COLOR INTERFERENCE OF URINE PIGMENT   Leukocytes,Ua (A) NEGATIVE    TEST NOT REPORTED DUE TO COLOR INTERFERENCE OF URINE PIGMENT   RBC / HPF 11-20 0 - 5 RBC/hpf   WBC, UA >50 0 - 5 WBC/hpf   Bacteria, UA RARE (A) NONE SEEN   Squamous Epithelial / HPF 0-5 0 - 5 /HPF   WBC Clumps PRESENT    Mucus PRESENT  DG Knee Complete 4 Views Right  Result Date: 08/22/2022 CLINICAL DATA:  pain EXAM: RIGHT KNEE - COMPLETE 4+ VIEW COMPARISON:  None Available. FINDINGS: Osteopenia. No acute fracture or dislocation. Tricompartmental degenerative changes with moderate to severe joint space narrowing of the lateral compartment, moderate joint space narrowing of the medial compartment with osteophyte formation and moderate to severe joint space narrowing and osteophyte formation of the patellofemoral compartment. No area of erosion or osseous destruction. No unexpected radiopaque foreign body. Chondrocalcinosis. No significant joint effusion. Vascular calcifications. IMPRESSION: Tricompartmental degenerative changes. Electronically Signed   By:  Meda Klinefelter M.D.   On: 08/22/2022 13:11   DG Pelvis Portable  Result Date: 08/22/2022 CLINICAL DATA:  pain weakness EXAM: PORTABLE PELVIS 1-2 VIEWS COMPARISON:  August 19, 2021, September 12, 2021 FINDINGS: Osteopenia. Revisualization of a compression fracture deformity of L5. Extent of vertebral body height loss is grossly similar in comparison to prior. Sacrum is completely obscured by overlapping bowel contents. No definitive acute fracture or dislocation on single AP view. Multiple pelvic clips. IMPRESSION: No definitive acute fracture or dislocation on single AP view. Grossly similar appearance of an L5 compression fracture deformity. If persistent clinical concern, recommend dedicated cross-sectional imaging. Electronically Signed   By: Meda Klinefelter M.D.   On: 08/22/2022 13:09   CT Head Wo Contrast  Result Date: 08/22/2022 CLINICAL DATA:  89 year old male TIA.  New/increasing weakness. EXAM: CT HEAD WITHOUT CONTRAST TECHNIQUE: Contiguous axial images were obtained from the base of the skull through the vertex without intravenous contrast. RADIATION DOSE REDUCTION: This exam was performed according to the departmental dose-optimization program which includes automated exposure control, adjustment of the mA and/or kV according to patient size and/or use of iterative reconstruction technique. COMPARISON:  Brain MRI 06/30/2020.  Head CT 08/03/2022. FINDINGS: Brain: Chronic small vessel disease most pronounced in the right corona radiata and bilateral basal ganglia. Small chronic right cerebellar infarct. Stable gray-white matter differentiation throughout the brain. No midline shift, ventriculomegaly, mass effect, evidence of mass lesion, intracranial hemorrhage or evidence of cortically based acute infarction. Vascular: Calcified atherosclerosis at the skull base. No suspicious intracranial vascular hyperdensity. Skull: No acute osseous abnormality identified. Sinuses/Orbits: Stable paranasal  sinuses. Chronic posterior right ethmoid opacification but other Visualized paranasal sinuses and mastoids are stable and well aerated. Other: No acute orbit or scalp soft tissue finding. IMPRESSION: No acute intracranial abnormality. Stable non contrast CT appearance of chronic small vessel ischemia. Electronically Signed   By: Odessa Fleming M.D.   On: 08/22/2022 09:37   DG Chest Port 1 View  Result Date: 08/22/2022 CLINICAL DATA:  Weakness EXAM: PORTABLE CHEST 1 VIEW COMPARISON:  05/03/2022 FINDINGS: The heart size and mediastinal contours are within normal limits. Aortic atherosclerosis. Post treatment changes within the left suprahilar region. Streaky opacity within the right mid lung. No pleural effusion or pneumothorax. The visualized skeletal structures are unremarkable. IMPRESSION: Streaky opacity within the right mid lung, atelectasis versus infiltrate. Electronically Signed   By: Duanne Guess D.O.   On: 08/22/2022 09:20     Assessment and Plan: * Generalized weakness Normal electrolytes and stable hemoglobin.  He does have baseline weakness and gets PT at home.  Will have PT eval and treat  Hematuria Unclear etiology Has negative urine culture from 08/22/2022 Urology consult in the morning  Prostate cancer [facility]) S/p prostatectomy and radiation in 1997 On Xtandi and Lupron every 6 months.  Malignant neoplasm of unspecified part of unspecified bronchus or lung (HCC) Status post XRT  with progression per oncology note  Multiple lacunar infarcts (HCC) History of remote CVA On aspirin but being held due to intermittent dysuria.  H/O prostate cancer Status post prostatectomy and radiation in 1997  Hypothyroidism Continue Synthroid Check TSH  Hyperlipidemia Continue Lipitor  Essential hypertension Continue amlodipine and lisinopril      Advance Care Planning:   Code Status: Full Code confirmed with patient  Consults: Urology will need to be called in the morning  Family  Communication: patient at bedside  Severity of Illness: The appropriate patient status for this patient is INPATIENT. Inpatient status is judged to be reasonable and necessary in order to provide the required intensity of service to ensure the patient's safety. The patient's presenting symptoms, physical exam findings, and initial radiographic and laboratory data in the context of their chronic comorbidities is felt to place them at high risk for further clinical deterioration. Furthermore, it is not anticipated that the patient will be medically stable for discharge from the hospital within 2 midnights of admission.   * I certify that at the point of admission it is my clinical judgment that the patient will require inpatient hospital care spanning beyond 2 midnights from the point of admission due to high intensity of service, high risk for further deterioration and high frequency of surveillance required.*  Author: Reva Bores, MD 09/06/2022 1:15 AM  For on call review [email or web address].

## 2022-09-06 NOTE — Hospital Course (Signed)
Patient with history of prostate cancer, HTN, HLD, hypothyroid, GERD, allergic rhinitis who presents with generalized weakness at home as well as progressive hematuria over the last 2 to 3 days.  Patient reports being treated for UTI approximately 2 weeks ago completing treatment about 2 days ago.  He has noted increasing bloody urine over the last 2 to 3 days.  He denies fever or chills he denies suprapubic or flank pain.  In the ED intraoperative she was noted to require 2-3 people to help get him up and around and it was felt that outpatient evaluation of his ongoing hematuria would be unable to be completed.  We were asked to admit to continue the workup.  Urology was consulted by the EDP who asked for a nonemergent consult in the morning.

## 2022-09-06 NOTE — Progress Notes (Signed)
Family would like patient to stay at [facility] in [place] or someplace in [place] or [employer].

## 2022-09-06 NOTE — Assessment & Plan Note (Signed)
Status post XRT with progression per oncology note

## 2022-09-06 NOTE — Progress Notes (Signed)
Hospitalist Transfer Note:    Nursing staff, Please call [facility] Admits & Consults System-Wide number on Amion [phone]) as soon as patient's arrival, so appropriate admitting provider can evaluate the pt.  Transferring facility: [facility] Requesting provider: Dr. Coralee Pesa (EDP at [facility]) Reason for transfer: admission for further evaluation and management of generalized weakness, gross hematuria.    89  year old male with medical history notable for BPH status post TURP, who presented to [facility] ED complaining of 2 to 3 days of progressive generalized weakness.  He lives at home with his wife, but over the last 2 to 3 days in the setting of generalized weakness, has experienced progressive difficulty in performing his ADLs as well as progressive difficulty with ambulation.  No associated any acute focal weakness.  He has chronic incontinence, but, over the course the last 4 days, has noted a red appearance associated with his urine, occasionally passing clots over this timeframe.  He is not on any blood thinners, but does follow with [facility] in the setting of a history of BPH. no reported recent trauma.   [facility] EDP has contacted on-call [facility], Dr. Vevelyn Francois, requesting non-urgent consult on the AM of 8/12.    Subsequently, I accepted this patient for transfer for inpatient admission to a med-tele bed at [facility]  for further work-up and management of the above.        Rodney Pigg, DO Hospitalist

## 2022-09-06 NOTE — Assessment & Plan Note (Signed)
Unclear etiology Has negative urine culture from 08/22/2022 Urology consult in the morning

## 2022-09-06 NOTE — Plan of Care (Signed)

## 2022-09-06 NOTE — Progress Notes (Addendum)
Report called to [place] at [facility]. Discharge Packet sent with [employer]ar This note is on the wrong patient.

## 2022-09-06 NOTE — Assessment & Plan Note (Addendum)
S/p prostatectomy and radiation in 1997 On Xtandi and Lupron every 6 months.

## 2022-09-06 NOTE — Assessment & Plan Note (Signed)
-  Continue amlodipine and lisinopril 

## 2022-09-06 NOTE — Assessment & Plan Note (Signed)
Normal electrolytes and stable hemoglobin.  He does have baseline weakness and gets PT at home.  Will have PT eval and treat

## 2022-09-06 NOTE — Assessment & Plan Note (Signed)
Continue Synthroid.  Check TSH. 

## 2022-09-06 NOTE — Assessment & Plan Note (Signed)
-  Continue Lipitor °

## 2022-09-06 NOTE — Assessment & Plan Note (Signed)
Status post prostatectomy and radiation in 1997

## 2022-09-06 NOTE — Assessment & Plan Note (Signed)
History of remote CVA On aspirin but being held due to intermittent dysuria.

## 2022-09-06 NOTE — Progress Notes (Addendum)
Prior-To-Admission Oral Chemotherapy for Treatment of Oncologic Disease   Order noted from Dr. Tinnie Gens to continue prior-to-admission oral chemotherapy regimen of Xtandi.  Procedure Per Pharmacy & Therapeutics Committee Policy: Orders for continuation of home oral chemotherapy for treatment of an oncologic disease will be held unless approved by an oncologist during current admission.    For patients receiving oncology care at [facility], inpatient pharmacist contacts patient's oncologist during regular office hours to review. If earlier review is medically necessary, attending physician consults [facility] on-call oncologist  Oral chemotherapy continuation order is on hold pending oncologist review, [facility] oncologist Thornton Papas has been notified by inpatient pharmacy.  Addendum: Per Dr. Truett Perna, patient's prostate cancer is managed by urology. Urology consulted this admission and Dr. Traci Sermon has been notified.     Cherylin Mylar, PharmD Clinical Pharmacist  8/12/202411:25 AM

## 2022-09-06 NOTE — Consult Note (Signed)
Urology Consult Note   Requesting Attending Physician:  Briant Cedar, MD Service Providing Consult: Urology  Consulting Attending: Dr. Lafonda Mosses   Reason for Consult:  Hematuria  HPI: [name] is seen in consultation for reasons noted above at the request of Briant Cedar, MD  ------------------  Assessment:  89 y.o. male with hematuria and weakness for the last 48 to 72 hours.  Patient was recently treated for urinary tract infection, completing his treatment around 8/10.  He reports blood in urine since then.  Past urologic history includes radical prostatectomy 1996, salvage XRT in 1999, Xtandi from 2016 through 2017, and ADT from 2010 through 2023.  He is followed by Dr. Mena Go[facility] of [facility].  Thankfully, he is not on any blood thinners.  On my arrival patient was alert though pleasantly confused.  Pure wick male catheter was in place draining lightly blood-tinged urine.     Recommendations: # Hematuria Undifferentiated hematuria, though likely secondary to radiation cystitis +/-  residual hemorrhagic cystitis.  Would recommend repeat urine culture.  Previous urinalysis was too bloody for proper processing, though what I am seeing in the collection tubing today should be fine.  Stable hemoglobin for the last few days and preserved renal function.  No acute urologic indication at this time.  Will wait for culture data and monitor as needed.  Can consider cystoscopy on an outpatient basis.  Please call with questions  Case and plan discussed with Dr. Lafonda Mosses  Past Medical History: Past Medical History:  Diagnosis Date   Aphasia    Arthritis    BNC (bladder neck contracture)    BPPV (benign paroxysmal positional vertigo)    Cataract    bilateral   Diverticulosis    H/O diarrhea SECONDARY TO RADIATION THERAPY --  INTERMITANT  DIARRHEA   Hematuria    History of hemorrhagic cystitis    SECONDARY RADIATION THERAPY 1997   History of prostate cancer  CURRENTLY  ELEVATED PSA--  LUPRON INJECTIONS   S/P PROSTATECTOMY AND RADIATION THERAPY  1997   Hyperlipidemia    Hypertension    Hypothyroidism    Radiation cystitis    Radiation proctitis    Urinary incontinence    Urinary retention    Vitamin D deficiency     Past Surgical History:  Past Surgical History:  Procedure Laterality Date   CATARACT EXTRACTION W/ INTRAOCULAR LENS IMPLANT Right    COLONOSCOPY W/ POLYPECTOMY  2005; 08/11/2010   2005: 1 cm hyperplastic sigmoid polyp, Dr. Laural Benes 2012: hyperplastic splenic flexure polyp -1 cm, diverticulosis, radiation proctitis, anal stenosis   CYSTO/ FULGERATION OF BLEEDERS/ RESECTION BLADDER NECK CONTRACTURE  07-06-2004   BNC AND RADIATION CYSTITIS   EYE SURGERY     KNEE ARTHROSCOPY Right    LUMBAR DISC SURGERY  1996   L4 -- L5   PROSTATECTOMY  1997   SPINE SURGERY     TRANSURETHRAL RESECTION OF BLADDER NECK N/A 06/23/2012   Procedure: TRANSURETHRAL RESECTION OF BLADDER NECK CONTRACTURE WITH GYRUS;  Surgeon: Garnett Farm, MD;  Location: [facility] [place];  Service: Urology;  Laterality: N/A;   TRANSURETHRAL RESECTION OF BLADDER TUMOR WITH GYRUS (TURBT-GYRUS) N/A 06/01/2013   Procedure: TRANSURETHRAL RESECTION OF BLADDER NECK CONTRACTURE WITH GYRUS (TURBT-GYRUS) AND INJECTION OF KENALOG;  Surgeon: Garnett Farm, MD;  Location: [facility] [place];  Service: Urology;  Laterality: N/A;    Medication: Current Facility-Administered Medications  Medication Dose Route Frequency Provider Last Rate Last Admin  acetaminophen (TYLENOL) tablet 650 mg  650 mg Oral Q6H PRN Reva Bores, MD       Or   acetaminophen (TYLENOL) suppository 650 mg  650 mg Rectal Q6H PRN Reva Bores, MD       amLODipine (NORVASC) tablet 10 mg  10 mg Oral Daily Reva Bores, MD   10 mg at 09/06/22 0938   atorvastatin (LIPITOR) tablet 40 mg  40 mg Oral Daily Reva Bores, MD   40 mg at 09/06/22 1610   lactated ringers infusion   Intravenous  Continuous Reva Bores, MD 75 mL/hr at 09/06/22 0114 New Bag at 09/06/22 0114   levothyroxine (SYNTHROID) tablet 125 mcg  125 mcg Oral Q0600 Reva Bores, MD   125 mcg at 09/06/22 0529   lisinopril (ZESTRIL) tablet 40 mg  40 mg Oral Daily Reva Bores, MD   40 mg at 09/06/22 0937   metoprolol tartrate (LOPRESSOR) injection 5 mg  5 mg Intravenous Q6H PRN Reva Bores, MD       ondansetron [facility]) tablet 4 mg  4 mg Oral Q6H PRN Reva Bores, MD       Or   ondansetron [facility]) injection 4 mg  4 mg Intravenous Q6H PRN Reva Bores, MD       oxyCODONE (Oxy IR/ROXICODONE) immediate release tablet 5 mg  5 mg Oral Q4H PRN Reva Bores, MD       pantoprazole (PROTONIX) EC tablet 20 mg  20 mg Oral Daily Reva Bores, MD   20 mg at 09/06/22 0937   polyethylene glycol (MIRALAX / GLYCOLAX) packet 17 g  17 g Oral Daily PRN Reva Bores, MD        Allergies: Allergies  Allergen Reactions   Caffeine Palpitations   Codeine Palpitations    Social History: Social History   Tobacco Use   Smoking status: Former    Current packs/day: 0.00    Types: Cigarettes    Start date: 1945    Quit date: 1965    Years since quitting: 59.6    Passive exposure: Past   Smokeless tobacco: Former    Types: Chew    Quit date: 1965   Tobacco comments:    SMOKED AND CHEW TOBACCO FOR 20 YRS QUIT AGE 67  Vaping Use   Vaping status: Never Used  Substance Use Topics   Alcohol use: No   Drug use: No    Family History Family History  Problem Relation Age of Onset   Prostate cancer Brother    Kidney disease Brother    Congestive Heart Failure Mother    CVA Mother 66   Pancreatitis Father    Pneumonia Father    Cancer Sister        breast   Obesity Sister    Early death Sister        appendicitis   Appendicitis Sister 20       rupture   Cancer Sister        breast   Kidney disease Sister    Alzheimer's disease Brother    Hip fracture Brother     Review of Systems  Unable to  perform ROS: Dementia     Objective   Vital signs in last 24 hours: BP (!) 172/69 (BP Location: Left Arm)   Pulse 87   Temp 98.2 F (36.8 C)   Resp 18   Ht 6' (1.829 m)   Wt 98 kg  SpO2 98%   BMI 29.30 kg/m   Physical Exam General: Infused, alert, resting, appropriate HEENT: /AT Pulmonary: Normal work of breathing Cardiovascular: RRR, no cyanosis Abdomen: Soft, NTTP, nondistended GU: Male PureWick catheter in place draining lightly blood-tinged urine  Most Recent Labs: Lab Results  Component Value Date   WBC 5.6 09/06/2022   HGB 11.4 (L) 09/06/2022   HCT 36.2 (L) 09/06/2022   PLT 310 09/06/2022    Lab Results  Component Value Date   NA 131 (L) 09/06/2022   K 4.3 09/06/2022   CL 99 09/06/2022   CO2 23 09/06/2022   BUN 11 09/06/2022   CREATININE 0.65 09/06/2022   CALCIUM 9.4 09/06/2022    Lab Results  Component Value Date   INR 1.01 04/03/2016   APTT 28 04/03/2016     Urine Culture: @LAB7RCNTIP (laburin,org,r9620,r9621)@   IMAGING: No results found.  ------  Elmon Kirschner, NP Pager: [phone]   Please contact the urology consult pager with any further questions/concerns.

## 2022-09-06 NOTE — Progress Notes (Signed)
PROGRESS NOTE  [name] QMV:[id number] DOB: 1932-12-24 DOA: 09/05/2022 PCP: Mechele Claude, MD  HPI/Recap of past 24 hours: [name] is a 89 y.o. male with medical history significant for prostate cancer, HTN, HLD, hypothyroidism, vertigo, hemorrhagic cystitis, urinary incontinence, GERD who presents with generalized weakness at home as well as progressive hematuria over the last 2 to 3 days.  Patient reports being treated for UTI approximately 2 weeks ago completing treatment about 2 days ago. In the ED, pt was noted to require 2-3 people to help get him up and around and it was felt that outpatient evaluation of his ongoing hematuria would be unable to be completed. Urology was consulted by the EDP.  Patient admitted for further management.     Today, patient still with hematuria, denies any abdominal pain, fever/chills, nausea/vomiting, chest pain, shortness of breath.    Assessment/Plan: Principal Problem:   Generalized weakness Active Problems:   Essential hypertension   Hyperlipidemia   Hypothyroidism   Multiple lacunar infarcts (HCC)   Malignant neoplasm of unspecified part of unspecified bronchus or lung (HCC)   Prostate cancer (HCC)   Hematuria   Hematuria Unclear etiology, no recent imaging done on admission UA not reported due to hematuria Noted negative urine culture from 08/22/2022 Renal/bladder ultrasound pending, further imaging pending on ultrasound Urology consulted Daily CBC  Generalized weakness Possibly multifactorial, due to deconditioning PT/OT Fall precautions  History of prostate cancer S/p prostatectomy and radiation in 1997 On Xtandi and Lupron every 6 months Follows with Urology   Malignant neoplasm of unspecified part of unspecified bronchus or lung Status post XRT with progression per oncology note   Multiple lacunar infarcts History of remote CVA On aspirin but being held due to intermittent dysuria    Hypothyroidism Continue Synthroid TSH WNL   Hyperlipidemia Continue Lipitor   Essential hypertension Continue amlodipine and lisinopril    Estimated body mass index is 29.3 kg/m as calculated from the following:   Height as of this encounter: 6' (1.829 m).   Weight as of this encounter: 98 kg.     Code Status: Full code  Family Communication: None at bedside  Disposition Plan: Status is: Inpatient Remains inpatient appropriate because: level of care      Consultants: Urology  Procedures: None  Antimicrobials: None  DVT prophylaxis: SCDs   Objective: Vitals:   09/06/22 0507 09/06/22 0805 09/06/22 0937 09/06/22 0938  BP: (!) 155/67 (!) 176/67 (!) 176/67 (!) 176/67  Pulse: 62 65    Resp: 18 18    Temp: 97.6 F (36.4 C) 98.2 F (36.8 C)    TempSrc: Oral     SpO2: 99% 98%    Weight:      Height:        Intake/Output Summary (Last 24 hours) at 09/06/2022 1203 Last data filed at 09/06/2022 0600 Gross per 24 hour  Intake 201.5 ml  Output 1100 ml  Net -898.5 ml   Filed Weights   09/06/22 0025  Weight: 98 kg    Exam: General: NAD  Cardiovascular: S1, S2 present Respiratory: CTAB Abdomen: Soft, nontender, nondistended, bowel sounds present Musculoskeletal: No bilateral pedal edema noted Skin: Normal Psychiatry: Normal mood     Data Reviewed: CBC: Recent Labs  Lab 09/05/22 1947 09/06/22 0557  WBC 7.6 5.6  NEUTROABS 5.4  --   HGB 11.4* 11.4*  HCT 34.4* 36.2*  MCV 90.8 96.3  PLT 354 310   Basic Metabolic Panel: Recent Labs  Lab 09/05/22  1947 09/06/22 0557  NA 127* 131*  K 4.3 4.3  CL 96* 99  CO2 25 23  GLUCOSE 105* 99  BUN 16 11  CREATININE 0.65 0.65  CALCIUM 9.5 9.4   GFR: Estimated Creatinine Clearance: 77.5 mL/min (by C-G formula based on SCr of 0.65 mg/dL). Liver Function Tests: No results for input(s): "AST", "ALT", "ALKPHOS", "BILITOT", "PROT", "ALBUMIN" in the last 168 hours. No results for input(s): "LIPASE",  "AMYLASE" in the last 168 hours. No results for input(s): "AMMONIA" in the last 168 hours. Coagulation Profile: No results for input(s): "INR", "PROTIME" in the last 168 hours. Cardiac Enzymes: No results for input(s): "CKTOTAL", "CKMB", "CKMBINDEX", "TROPONINI" in the last 168 hours. BNP (last 3 results) No results for input(s): "PROBNP" in the last 8760 hours. HbA1C: No results for input(s): "HGBA1C" in the last 72 hours. CBG: No results for input(s): "GLUCAP" in the last 168 hours. Lipid Profile: No results for input(s): "CHOL", "HDL", "LDLCALC", "TRIG", "CHOLHDL", "LDLDIRECT" in the last 72 hours. Thyroid Function Tests: Recent Labs    09/06/22 0557  TSH 2.796   Anemia Panel: No results for input(s): "VITAMINB12", "FOLATE", "FERRITIN", "TIBC", "IRON", "RETICCTPCT" in the last 72 hours. Urine analysis:    Component Value Date/Time   COLORURINE RED (A) 09/05/2022 2049   APPEARANCEUR CLEAR 09/05/2022 2049   LABSPEC  09/05/2022 2049    TEST NOT REPORTED DUE TO COLOR INTERFERENCE OF URINE PIGMENT   PHURINE  09/05/2022 2049    TEST NOT REPORTED DUE TO COLOR INTERFERENCE OF URINE PIGMENT   GLUCOSEU (A) 09/05/2022 2049    TEST NOT REPORTED DUE TO COLOR INTERFERENCE OF URINE PIGMENT   HGBUR (A) 09/05/2022 2049    TEST NOT REPORTED DUE TO COLOR INTERFERENCE OF URINE PIGMENT   BILIRUBINUR (A) 09/05/2022 2049    TEST NOT REPORTED DUE TO COLOR INTERFERENCE OF URINE PIGMENT   KETONESUR (A) 09/05/2022 2049    TEST NOT REPORTED DUE TO COLOR INTERFERENCE OF URINE PIGMENT   PROTEINUR (A) 09/05/2022 2049    TEST NOT REPORTED DUE TO COLOR INTERFERENCE OF URINE PIGMENT   UROBILINOGEN 1.0 05/16/2013 1917   NITRITE (A) 09/05/2022 2049    TEST NOT REPORTED DUE TO COLOR INTERFERENCE OF URINE PIGMENT   LEUKOCYTESUR (A) 09/05/2022 2049    TEST NOT REPORTED DUE TO COLOR INTERFERENCE OF URINE PIGMENT   Sepsis Labs: @LABRCNTIP (procalcitonin:4,lacticidven:4)  )No results found for this or any  previous visit (from the past 240 hour(s)).    Studies: No results found.  Scheduled Meds:  amLODipine  10 mg Oral Daily   atorvastatin  40 mg Oral Daily   enzalutamide  160 mg Oral Daily   levothyroxine  125 mcg Oral Q0600   lisinopril  40 mg Oral Daily   pantoprazole  20 mg Oral Daily    Continuous Infusions:  lactated ringers 75 mL/hr at 09/06/22 0114     LOS: 0 days     Briant Cedar, MD [facility]  If 7PM-7AM, please contact night-coverage [email or web address] 09/06/2022, 12:03 PM

## 2022-09-07 DIAGNOSIS — R531 Weakness: Secondary | ICD-10-CM | POA: Diagnosis not present

## 2022-09-07 LAB — BASIC METABOLIC PANEL
Anion gap: 8 (ref 5–15)
BUN: 11 mg/dL (ref 8–23)
CO2: 26 mmol/L (ref 22–32)
Calcium: 9.3 mg/dL (ref 8.9–10.3)
Chloride: 99 mmol/L (ref 98–111)
Creatinine, Ser: 0.59 mg/dL — ABNORMAL LOW (ref 0.61–1.24)
GFR, Estimated: >60 mL/min (ref >60)
Glucose, Bld: 99 mg/dL (ref 70–99)
Potassium: 4.4 mmol/L (ref 3.5–5.1)
Sodium: 133 mmol/L — ABNORMAL LOW (ref 135–145)

## 2022-09-07 LAB — CBC WITH DIFFERENTIAL/PLATELET
Abs Immature Granulocytes: 0.03 10*3/uL (ref 0.00–0.07)
Basophils Absolute: 0 10*3/uL (ref 0.0–0.1)
Basophils Relative: 1 %
Eosinophils Absolute: 0.5 10*3/uL (ref 0.0–0.5)
Eosinophils Relative: 6 %
HCT: 38.1 % — ABNORMAL LOW (ref 39.0–52.0)
Hemoglobin: 12.1 g/dL — ABNORMAL LOW (ref 13.0–17.0)
Immature Granulocytes: 0 %
Lymphocytes Relative: 14 %
Lymphs Abs: 1 10*3/uL (ref 0.7–4.0)
MCH: 29.7 pg (ref 26.0–34.0)
MCHC: 31.8 g/dL (ref 30.0–36.0)
MCV: 93.6 fL (ref 80.0–100.0)
Monocytes Absolute: 0.7 10*3/uL (ref 0.1–1.0)
Monocytes Relative: 9 %
Neutro Abs: 5.2 10*3/uL (ref 1.7–7.7)
Neutrophils Relative %: 70 %
Platelets: 335 10*3/uL (ref 150–400)
RBC: 4.07 MIL/uL — ABNORMAL LOW (ref 4.22–5.81)
RDW: 15.2 % (ref 11.5–15.5)
WBC: 7.4 10*3/uL (ref 4.0–10.5)
nRBC: 0 % (ref 0.0–0.2)

## 2022-09-07 NOTE — Plan of Care (Signed)
  Problem: Clinical Measurements: Goal: Will remain free from infection Outcome: Progressing Goal: Diagnostic test results will improve Outcome: Progressing   Problem: Activity: Goal: Risk for activity intolerance will decrease Outcome: Progressing   Problem: Elimination: Goal: Will not experience complications related to bowel motility Outcome: Progressing Goal: Will not experience complications related to urinary retention Outcome: Progressing   Problem: Pain Managment: Goal: General experience of comfort will improve Outcome: Progressing   Problem: Safety: Goal: Ability to remain free from injury will improve Outcome: Progressing

## 2022-09-07 NOTE — Progress Notes (Signed)
Physical Therapy Treatment Patient Details Name: [name] MRN: [id number] DOB: 05/24/1932 Today's Date: 09/07/2022   History of Present Illness [name] is a 89 y.o. male with medical history significant of   prostate cancer, HTN, HLD, hypothyroid, GERD, allergic rhinitis who presents 09/05/22  with generalized weakness at home as well as progressive hematuria over the last 2 to 3 days.    PT Comments  Pt ambulated 67' with RW in hall with ongoing cues to step closer to the walker, pt was incontinent of bowel while walking.     If plan is discharge home, recommend the following: A little help with walking and/or transfers;A little help with bathing/dressing/bathroom;Assistance with cooking/housework;Assist for transportation;Help with stairs or ramp for entrance   Can travel by private vehicle        Equipment Recommendations  None recommended by PT    Recommendations for Other Services       Precautions / Restrictions Precautions Precautions: Fall Precaution Comments: incontinent bladder and bowel Restrictions Weight Bearing Restrictions: No     Mobility  Bed Mobility Overal bed mobility: Needs Assistance Bed Mobility: Sit to Supine       Sit to supine: Mod assist   General bed mobility comments: assist to bring BLEs into bed    Transfers Overall transfer level: Needs assistance Equipment used: Rolling walker (2 wheels) Transfers: Sit to/from Stand, Bed to chair/wheelchair/BSC Sit to Stand: Mod assist   Step pivot transfers: Min assist       General transfer comment: cues for safety, pt tends to release grip on RW; transferred recliner to [facility], then BSC to bed    Ambulation/Gait Ambulation/Gait assistance: Contact guard assist Gait Distance (Feet): 60 Feet Assistive device: Rolling walker (2 wheels) Gait Pattern/deviations: Step-through pattern, Trunk flexed, Decreased stride length Gait velocity: decr     General Gait Details: no response to  verbal cues to step closer to RW and to increase step length; pt ambulates quite far behind RW with trunk flexed; incontinent of bowel while walking   Stairs             Wheelchair Mobility     Tilt Bed    Modified Rankin (Stroke Patients Only)       Balance Overall balance assessment: Needs assistance Sitting-balance support: Bilateral upper extremity supported, Feet supported Sitting balance-Leahy Scale: Fair     Standing balance support: Bilateral upper extremity supported, During functional activity, Reliant on assistive device for balance Standing balance-Leahy Scale: Poor Standing balance comment: stabilizes on RW                            Cognition Arousal: Alert Behavior During Therapy: WFL for tasks assessed/performed Overall Cognitive Status: No family/caregiver present to determine baseline cognitive functioning Area of Impairment: Orientation, Safety/judgement                 Orientation Level: Place, Time, Situation, Disoriented to             General Comments: HOH, not oriented to location/situation        Exercises  Marching x10 both seated Long arc quads x 10 both seated Ankle pumps x 10 both seated    General Comments        Pertinent Vitals/Pain Pain Assessment Pain Assessment: No/denies pain    Home Living  Prior Function            PT Goals (current goals can now be found in the care plan section) Acute Rehab PT Goals Patient Stated Goal: agreeable to get OOB PT Goal Formulation: Patient unable to participate in goal setting Time For Goal Achievement: 09/20/22 Potential to Achieve Goals: Fair Progress towards PT goals: Progressing toward goals    Frequency    Min 1X/week      PT Plan      Co-evaluation              AM-PAC PT "6 Clicks" Mobility   Outcome Measure  Help needed turning from your back to your side while in a flat bed without using  bedrails?: A Lot Help needed moving from lying on your back to sitting on the side of a flat bed without using bedrails?: A Lot Help needed moving to and from a bed to a chair (including a wheelchair)?: A Little Help needed standing up from a chair using your arms (e.g., wheelchair or bedside chair)?: A Lot Help needed to walk in hospital room?: A Little Help needed climbing 3-5 steps with a railing? : A Lot 6 Click Score: 14    End of Session Equipment Utilized During Treatment: Gait belt Activity Tolerance: Patient tolerated treatment well Patient left: in chair;with call bell/phone within reach;with chair alarm set;with nursing/sitter in room Nurse Communication: Mobility status PT Visit Diagnosis: Unsteadiness on feet (R26.81);History of falling (Z91.81);Muscle weakness (generalized) (M62.81)     Time: 3818-2993 PT Time Calculation (min) (ACUTE ONLY): 24 min  Charges:    $Gait Training: 8-22 mins $Therapeutic Activity: 8-22 mins PT General Charges $$ ACUTE PT VISIT: 1 Visit                     Tamala Ser PT 09/07/2022  Acute Rehabilitation Services  Office [phone]

## 2022-09-07 NOTE — Progress Notes (Signed)
PROGRESS NOTE  [name] EGB:[id number] DOB: 1932-02-25 DOA: 09/05/2022 PCP: Mechele Claude, MD  HPI/Recap of past 24 hours: [name] is a 89 y.o. male with medical history significant for prostate cancer, HTN, HLD, hypothyroidism, vertigo, hemorrhagic cystitis, urinary incontinence, GERD who presents with generalized weakness at home as well as progressive hematuria over the last 2 to 3 days.  Patient reports being treated for UTI approximately 2 weeks ago completing treatment about 2 days ago. In the ED, pt was noted to require 2-3 people to help get him up and around and it was felt that outpatient evaluation of his ongoing hematuria would be unable to be completed. Urology was consulted by the EDP.  Patient admitted for further management.     Today, patient denies any new complaints.  Hematuria seems to have resolved    Assessment/Plan: Principal Problem:   Generalized weakness Active Problems:   Essential hypertension   Hyperlipidemia   Hypothyroidism   Multiple lacunar infarcts (HCC)   Malignant neoplasm of unspecified part of unspecified bronchus or lung (HCC)   Prostate cancer (HCC)   Hematuria   Hematuria Unclear etiology, no recent imaging done on admission UA not reported due to hematuria Noted negative urine culture from 08/22/2022 Renal/bladder ultrasound unremarkable Urology consulted, recommend repeat urine culture, outpatient follow-up, signed off Repeat urine culture pending Daily CBC  Generalized weakness Possibly multifactorial, due to deconditioning PT/OT-recommend home health Fall precautions  History of prostate cancer S/p prostatectomy and radiation in 1997 On Xtandi and Lupron every 6 months Follows with Urology   Malignant neoplasm of unspecified part of unspecified bronchus or lung Status post XRT with progression per oncology note   Multiple lacunar infarcts History of remote CVA On aspirin but being held due to intermittent  dysuria   Hypothyroidism Continue Synthroid TSH WNL   Hyperlipidemia Continue Lipitor   Essential hypertension Continue amlodipine and lisinopril    Estimated body mass index is 29.3 kg/m as calculated from the following:   Height as of this encounter: 6' (1.829 m).   Weight as of this encounter: 98 kg.     Code Status: Full code  Family Communication: None at bedside  Disposition Plan: Status is: Inpatient Remains inpatient appropriate because: level of care      Consultants: Urology  Procedures: None  Antimicrobials: None  DVT prophylaxis: SCDs   Objective: Vitals:   09/06/22 1251 09/06/22 2102 09/07/22 0956 09/07/22 1349  BP: (!) 172/69 (!) 170/59 (!) 150/75 (!) 164/66  Pulse: 87 81  79  Resp: 18 15  18   Temp:  97.7 F (36.5 C)  97.7 F (36.5 C)  TempSrc:  Oral  Oral  SpO2: 98% 97%  97%  Weight:      Height:        Intake/Output Summary (Last 24 hours) at 09/07/2022 1731 Last data filed at 09/07/2022 1510 Gross per 24 hour  Intake 480 ml  Output 3000 ml  Net -2520 ml   Filed Weights   09/06/22 0025  Weight: 98 kg    Exam: General: NAD  Cardiovascular: S1, S2 present Respiratory: CTAB Abdomen: Soft, nontender, nondistended, bowel sounds present Musculoskeletal: No bilateral pedal edema noted Skin: Normal Psychiatry: Normal mood     Data Reviewed: CBC: Recent Labs  Lab 09/05/22 1947 09/06/22 0557 09/07/22 0749  WBC 7.6 5.6 7.4  NEUTROABS 5.4  --  5.2  HGB 11.4* 11.4* 12.1*  HCT 34.4* 36.2* 38.1*  MCV 90.8 96.3 93.6  PLT  354 310 335   Basic Metabolic Panel: Recent Labs  Lab 09/05/22 1947 09/06/22 0557 09/07/22 0749  NA 127* 131* 133*  K 4.3 4.3 4.4  CL 96* 99 99  CO2 25 23 26   GLUCOSE 105* 99 99  BUN 16 11 11   CREATININE 0.65 0.65 0.59*  CALCIUM 9.5 9.4 9.3   GFR: Estimated Creatinine Clearance: 77.5 mL/min (A) (by C-G formula based on SCr of 0.59 mg/dL (L)). Liver Function Tests: No results for input(s):  "AST", "ALT", "ALKPHOS", "BILITOT", "PROT", "ALBUMIN" in the last 168 hours. No results for input(s): "LIPASE", "AMYLASE" in the last 168 hours. No results for input(s): "AMMONIA" in the last 168 hours. Coagulation Profile: No results for input(s): "INR", "PROTIME" in the last 168 hours. Cardiac Enzymes: No results for input(s): "CKTOTAL", "CKMB", "CKMBINDEX", "TROPONINI" in the last 168 hours. BNP (last 3 results) No results for input(s): "PROBNP" in the last 8760 hours. HbA1C: No results for input(s): "HGBA1C" in the last 72 hours. CBG: No results for input(s): "GLUCAP" in the last 168 hours. Lipid Profile: No results for input(s): "CHOL", "HDL", "LDLCALC", "TRIG", "CHOLHDL", "LDLDIRECT" in the last 72 hours. Thyroid Function Tests: Recent Labs    09/06/22 0557  TSH 2.796   Anemia Panel: No results for input(s): "VITAMINB12", "FOLATE", "FERRITIN", "TIBC", "IRON", "RETICCTPCT" in the last 72 hours. Urine analysis:    Component Value Date/Time   COLORURINE YELLOW 09/06/2022 2242   APPEARANCEUR HAZY (A) 09/06/2022 2242   LABSPEC 1.004 (L) 09/06/2022 2242   PHURINE 7.0 09/06/2022 2242   GLUCOSEU NEGATIVE 09/06/2022 2242   HGBUR LARGE (A) 09/06/2022 2242   BILIRUBINUR NEGATIVE 09/06/2022 2242   KETONESUR NEGATIVE 09/06/2022 2242   PROTEINUR NEGATIVE 09/06/2022 2242   UROBILINOGEN 1.0 05/16/2013 1917   NITRITE NEGATIVE 09/06/2022 2242   LEUKOCYTESUR LARGE (A) 09/06/2022 2242   Sepsis Labs: @LABRCNTIP (procalcitonin:4,lacticidven:4)  )No results found for this or any previous visit (from the past 240 hour(s)).    Studies: No results found.  Scheduled Meds:  amLODipine  10 mg Oral Daily   atorvastatin  40 mg Oral Daily   levothyroxine  125 mcg Oral Q0600   lisinopril  40 mg Oral Daily   pantoprazole  20 mg Oral Daily    Continuous Infusions:     LOS: 1 day     Briant Cedar, MD [facility]  If 7PM-7AM, please contact  night-coverage [email or web address] 09/07/2022, 5:31 PM

## 2022-09-07 NOTE — Progress Notes (Signed)
     Subjective: 8/13: NAEON. Pt resting in bed on rounds. In good spirits with no hematuria  Objective: Vital signs in last 24 hours: Temp:  [97.7 F (36.5 C)] 97.7 F (36.5 C) (08/12 2102) Pulse Rate:  [81-87] 81 (08/12 2102) Resp:  [15-18] 15 (08/12 2102) BP: (170-176)/(59-69) 170/59 (08/12 2102) SpO2:  [97 %-98 %] 97 % (08/12 2102)  Assessment/Plan: # Hematuria Undifferentiated hematuria, though likely secondary to radiation cystitis +/-  residual hemorrhagic cystitis. Resolved as of today. Will have schedulers contact his wife to set up office cystoscopy. Urology will sign off at this time. Please call with questions.   Intake/Output from previous day: 08/12 0701 - 08/13 0700 In: 280 [P.O.:280] Out: 3600 [Urine:3600]  Intake/Output this shift: No intake/output data recorded.  Physical Exam:  General: Alert and oriented CV: No cyanosis Lungs: equal chest rise Gu: Purewick in place, clear yellow urine  Lab Results: Recent Labs    09/05/22 1947 09/06/22 0557 09/07/22 0749  HGB 11.4* 11.4* 12.1*  HCT 34.4* 36.2* 38.1*   BMET Recent Labs    09/06/22 0557 09/07/22 0749  NA 131* 133*  K 4.3 4.4  CL 99 99  CO2 23 26  GLUCOSE 99 99  BUN 11 11  CREATININE 0.65 0.59*  CALCIUM 9.4 9.3     Studies/Results: US RENAL  Result Date: 09/06/2022 CLINICAL DATA:  Hematuria EXAM: RENAL / URINARY TRACT ULTRASOUND COMPLETE COMPARISON:  01/11/2021 abdominal CT FINDINGS: Right Kidney: Renal measurements: 9 x 5 x 5 cm = volume: 116 mL. No hydronephrosis or evidence of mass. Left Kidney: Renal measurements: 9 x 6 x 5 cm = volume: 130 mL. Echogenicity within normal limits. No mass or hydronephrosis visualized. Bladder: Not visualized.  History of catheter with presumed decompression. IMPRESSION: 1. Symmetric and unremarkable kidneys. 2. Nonvisualized bladder. Electronically Signed   By: Tiburcio Pea M.D.   On: 09/06/2022 14:53      LOS: 1 day   Elmon Kirschner,  NP [facility] Pager: [phone]  09/07/2022, 9:14 AM

## 2022-09-07 NOTE — Evaluation (Signed)
Occupational Therapy Evaluation Patient Details Name: [name] MRN: [id number] DOB: 1932/12/27 Today's Date: 09/07/2022   History of Present Illness [name] is a 89 y.o. male with medical history significant of   prostate cancer, HTN, HLD, hypothyroid, GERD, allergic rhinitis who presents 09/05/22  with generalized weakness at home as well as progressive hematuria over the last 2 to 3 days.   Clinical Impression   Patient is a 89 year old male who was admitted for above. Patient was living at home with wife PLOF. Currently, patient is mod A for transfer with RW with increased time and cues for proper hand placement. Patient was  Patient was noted to have decreased functional activity tolerance, decreased endurance, decreased standing balance, decreased safety awareness, and decreased knowledge of AD/AE impacting participation in ADLs. Plan for patient to d/c home with [facility] and family support.  Patient would continue to benefit from skilled OT services at this time while admitted and after d/c to address noted deficits in order to improve overall safety and independence in ADLs.          If plan is discharge home, recommend the following: A little help with walking and/or transfers;A little help with bathing/dressing/bathroom;Assistance with cooking/housework;Direct supervision/assist for medications management;Assist for transportation;Help with stairs or ramp for entrance;Direct supervision/assist for financial management;Supervision due to cognitive status    Functional Status Assessment  Patient has had a recent decline in their functional status and demonstrates the ability to make significant improvements in function in a reasonable and predictable amount of time.  Equipment Recommendations  None recommended by OT       Precautions / Restrictions Precautions Precautions: Fall Precaution Comments: incontinent Restrictions Weight Bearing Restrictions: No      Mobility  Bed Mobility Overal bed mobility: Needs Assistance Bed Mobility: Rolling, Sidelying to Sit Rolling: Min assist Sidelying to sit: Min assist       General bed mobility comments: mod support to scoot to bed edge            ADL either performed or assessed with clinical judgement   ADL Overall ADL's : Needs assistance/impaired Eating/Feeding: Supervision/ safety;Sitting   Grooming: Sitting;Set up;Oral care   Upper Body Bathing: Sitting;Minimal assistance   Lower Body Bathing: Sitting/lateral leans;Sit to/from stand;Total assistance;Maximal assistance   Upper Body Dressing : Minimal assistance;Sitting   Lower Body Dressing: Sitting/lateral leans;Sit to/from stand;Maximal assistance   Toilet Transfer: Moderate assistance;Stand-pivot;Rolling walker (2 wheels) Toilet Transfer Details (indicate cue type and reason): to transfer to recliner in room with increased time. patient was able to teach therapist on proper hand placement during first sit to stands but unable to carryover for next ones.                 Vision Baseline Vision/History: 1 Wears glasses              Pertinent Vitals/Pain Pain Assessment Pain Assessment: No/denies pain     Extremity/Trunk Assessment Upper Extremity Assessment Upper Extremity Assessment: Overall WFL for tasks assessed   Lower Extremity Assessment Lower Extremity Assessment: Defer to PT evaluation   Cervical / Trunk Assessment Cervical / Trunk Assessment: Kyphotic   Communication Communication Communication: No apparent difficulties   Cognition Arousal: Alert Behavior During Therapy: WFL for tasks assessed/performed Overall Cognitive Status: No family/caregiver present to determine baseline cognitive functioning Area of Impairment: Orientation, Safety/judgement                 Orientation Level: Place, Time, Situation  Safety/Judgement: Decreased awareness of deficits     General Comments: HOH, patient  reported he was at [employer].                Home Living Family/patient expects to be discharged to:: Private residence Living Arrangements: Spouse/significant other;Children Available Help at Discharge: Family;Available 24 hours/day Type of Home: House Home Access: Stairs to enter [employer] of Steps: 3 Entrance Stairs-Rails: Right Home Layout: One level     Bathroom Shower/Tub: Producer, television/film/video: Handicapped height Bathroom Accessibility: Yes   Home Equipment: Agricultural consultant (2 wheels);Cane - single point;Shower seat - built in;Grab bars - toilet;Grab bars - tub/shower;Hand held shower head   Additional Comments: info from previous encounter      Prior Functioning/Environment Prior Level of Function : Needs assist       Physical Assist : Mobility (physical) Mobility (physical): Transfers;Gait;Stairs   Mobility Comments: pt. reports using RW ADLs Comments: requires assistance        OT Problem List: Decreased activity tolerance;Impaired balance (sitting and/or standing);Decreased coordination;Decreased safety awareness;Decreased knowledge of precautions;Decreased knowledge of use of DME or AE      OT Treatment/Interventions: Self-care/ADL training;Energy conservation;Therapeutic exercise;DME and/or AE instruction;Therapeutic activities;Patient/family education;Balance training    OT Goals(Current goals can be found in the care plan section) Acute Rehab OT Goals Patient Stated Goal: to see his wife OT Goal Formulation: Patient unable to participate in goal setting Time For Goal Achievement: 09/21/22 Potential to Achieve Goals: Fair  OT Frequency: Min 1X/week       AM-PAC OT "6 Clicks" Daily Activity     Outcome Measure Help from another person eating meals?: A Little Help from another person taking care of personal grooming?: A Little Help from another person toileting, which includes using toliet, bedpan, or urinal?: A Lot Help from  another person bathing (including washing, rinsing, drying)?: A Lot Help from another person to put on and taking off regular upper body clothing?: A Little Help from another person to put on and taking off regular lower body clothing?: A Lot 6 Click Score: 15   End of Session Equipment Utilized During Treatment: Gait belt;Rolling walker (2 wheels) Nurse Communication: Mobility status  Activity Tolerance: Patient tolerated treatment well Patient left: in chair;with call bell/phone within reach;with chair alarm set  OT Visit Diagnosis: Unsteadiness on feet (R26.81);Other abnormalities of gait and mobility (R26.89)                Time: 0109-3235 OT Time Calculation (min): 24 min Charges:  OT General Charges $OT Visit: 1 Visit OT Evaluation $OT Eval Low Complexity: 1 Low OT Treatments $Self Care/Home Management : 8-22 mins  Rosalio Loud, MS Acute Rehabilitation Department Office# [phone]   Selinda Flavin 09/07/2022, 12:26 PM

## 2022-09-08 ENCOUNTER — Inpatient Hospital Stay (HOSPITAL_COMMUNITY): Payer: Medicare Other

## 2022-09-08 DIAGNOSIS — R531 Weakness: Secondary | ICD-10-CM | POA: Diagnosis not present

## 2022-09-08 LAB — BASIC METABOLIC PANEL
Anion gap: 11 (ref 5–15)
BUN: 12 mg/dL (ref 8–23)
CO2: 22 mmol/L (ref 22–32)
Calcium: 9.2 mg/dL (ref 8.9–10.3)
Chloride: 95 mmol/L — ABNORMAL LOW (ref 98–111)
Creatinine, Ser: 0.68 mg/dL (ref 0.61–1.24)
GFR, Estimated: >60 mL/min (ref >60)
Glucose, Bld: 112 mg/dL — ABNORMAL HIGH (ref 70–99)
Potassium: 4 mmol/L (ref 3.5–5.1)
Sodium: 128 mmol/L — ABNORMAL LOW (ref 135–145)

## 2022-09-08 LAB — CBC WITH DIFFERENTIAL/PLATELET
Abs Immature Granulocytes: 0.04 10*3/uL (ref 0.00–0.07)
Basophils Absolute: 0 10*3/uL (ref 0.0–0.1)
Basophils Relative: 1 %
Eosinophils Absolute: 0.2 10*3/uL (ref 0.0–0.5)
Eosinophils Relative: 3 %
HCT: 41.1 % (ref 39.0–52.0)
Hemoglobin: 13.1 g/dL (ref 13.0–17.0)
Immature Granulocytes: 1 %
Lymphocytes Relative: 11 %
Lymphs Abs: 0.9 10*3/uL (ref 0.7–4.0)
MCH: 29.9 pg (ref 26.0–34.0)
MCHC: 31.9 g/dL (ref 30.0–36.0)
MCV: 93.8 fL (ref 80.0–100.0)
Monocytes Absolute: 0.6 10*3/uL (ref 0.1–1.0)
Monocytes Relative: 7 %
Neutro Abs: 6.6 10*3/uL (ref 1.7–7.7)
Neutrophils Relative %: 77 %
Platelets: 359 10*3/uL (ref 150–400)
RBC: 4.38 MIL/uL (ref 4.22–5.81)
RDW: 15 % (ref 11.5–15.5)
WBC: 8.4 10*3/uL (ref 4.0–10.5)
nRBC: 0 % (ref 0.0–0.2)

## 2022-09-08 MED ORDER — QUETIAPINE FUMARATE 25 MG PO TABS
12.5000 mg | ORAL_TABLET | Freq: Every day | ORAL | Status: DC
  Administered 2022-09-08 – 2022-09-13 (×5): 12.5 mg via ORAL
  Filled 2022-09-08 (×6): qty 1

## 2022-09-08 MED ORDER — SODIUM CHLORIDE 0.9 % IV SOLN: INTRAVENOUS | Status: DC

## 2022-09-08 MED ORDER — IOHEXOL 300 MG/ML  SOLN
100.0000 mL | Freq: Once | INTRAMUSCULAR | Status: AC | PRN
  Administered 2022-09-08: 100 mL via INTRAVENOUS

## 2022-09-08 MED ORDER — AMLODIPINE BESYLATE 5 MG PO TABS
5.0000 mg | ORAL_TABLET | Freq: Every day | ORAL | Status: DC
  Administered 2022-09-09 – 2022-09-14 (×6): 5 mg via ORAL
  Filled 2022-09-08 (×6): qty 1

## 2022-09-08 MED ORDER — SODIUM CHLORIDE 0.9 % IV SOLN
1.0000 g | INTRAVENOUS | Status: DC
  Administered 2022-09-08 – 2022-09-11 (×4): 1 g via INTRAVENOUS
  Filled 2022-09-08 (×4): qty 10

## 2022-09-08 MED ORDER — CYCLOBENZAPRINE HCL 5 MG PO TABS
5.0000 mg | ORAL_TABLET | Freq: Three times a day (TID) | ORAL | Status: DC | PRN
  Administered 2022-09-12 – 2022-09-13 (×2): 5 mg via ORAL
  Filled 2022-09-08 (×3): qty 1

## 2022-09-08 MED ORDER — OXYCODONE HCL 5 MG PO TABS
10.0000 mg | ORAL_TABLET | ORAL | Status: DC | PRN
  Administered 2022-09-13 – 2022-09-14 (×2): 10 mg via ORAL
  Filled 2022-09-08 (×2): qty 2

## 2022-09-08 MED ORDER — LISINOPRIL 20 MG PO TABS
20.0000 mg | ORAL_TABLET | Freq: Every day | ORAL | Status: DC
  Administered 2022-09-09 – 2022-09-14 (×6): 20 mg via ORAL
  Filled 2022-09-08 (×6): qty 1

## 2022-09-08 NOTE — Progress Notes (Signed)
PROGRESS NOTE    [name]  WUJ:[id number] DOB: 01-Aug-1932 DOA: 09/05/2022 PCP: Mechele Claude, MD    Brief Narrative: 89 y.o. male with medical history significant for prostate cancer, HTN, HLD, hypothyroidism, vertigo, hemorrhagic cystitis, urinary incontinence, GERD who presents with generalized weakness at home as well as progressive hematuria over the last 2 to 3 days.  Patient reports being treated for UTI approximately 2 weeks ago completing treatment about 2 days ago. In the ED, pt was noted to require 2-3 people to help get him up and around and it was felt that outpatient evaluation of his ongoing hematuria would be unable to be completed. Urology was consulted by the EDP.  Patient admitted for further management.    Assessment & Plan:   Principal Problem:   Generalized weakness Active Problems:   Essential hypertension   Hyperlipidemia   Hypothyroidism   Multiple lacunar infarcts (HCC)   Malignant neoplasm of unspecified part of unspecified bronchus or lung (HCC)   Prostate cancer (HCC)   Hematuria  Hematuria-urine culture this admission with gram-negative rods follow-up final culture and sensitivity.  Start Rocephin today. Renal/bladder ultrasound unremarkable Urology consulted, recommend repeat urine culture, outpatient follow-up, signed off   Generalized weakness Possibly multifactorial, due to deconditioning PT/OT-recommend home health Fall precautions   History of prostate cancer S/p prostatectomy and radiation in 1997 On Xtandi and Lupron every 6 months Follows with Urology   Malignant neoplasm of unspecified part of unspecified bronchus or lung Status post XRT with progression per oncology note   Multiple lacunar infarcts History of remote CVA On aspirin but being held due to intermittent dysuria   Hypothyroidism Continue Synthroid TSH WNL   Hyperlipidemia Continue Lipitor   Essential hypertension On amlodipine and lisinopril Blood pressure  soft to decrease the dose of Norvasc and lisinopril  Estimated body mass index is 29.3 kg/m as calculated from the following:   Height as of this encounter: 6' (1.829 m).   Weight as of this encounter: 98 kg.  DVT prophylaxis: lovenox Code Status:full  Family Communication:dw wife Disposition Plan:  Status is: Inpatient Remains inpatient appropriate because: Urinary tract infection hematuria weakness history of prostate cancer IV antibiotics   Consultants:  Urology  Procedures:none Antimicrobials:rocephin  Subjective:  Resting in bed seen by physical therapy yesterday recommended home health PT on discharge continues with dark urine/hematuria Here reports feeling very weak Objective: Vitals:   09/07/22 0956 09/07/22 1349 09/07/22 2056 09/08/22 0653  BP: (!) 150/75 (!) 164/66 138/60 134/68  Pulse:  79 72 72  Resp:  18 14 14   Temp:  97.7 F (36.5 C) 98.3 F (36.8 C) 97.8 F (36.6 C)  TempSrc:  Oral Oral Oral  SpO2:  97% 95% 98%  Weight:      Height:        Intake/Output Summary (Last 24 hours) at 09/08/2022 1124 Last data filed at 09/07/2022 1855 Gross per 24 hour  Intake 240 ml  Output 600 ml  Net -360 ml   Filed Weights   09/06/22 0025  Weight: 98 kg    Examination:  General exam: Appears in nad Respiratory system: Clear to auscultation. Respiratory effort normal. Cardiovascular system: S1 & S2 heard, RRR. No JVD, murmurs, rubs, gallops or clicks. No pedal edema. Gastrointestinal system: Abdomen is nondistended, soft and nontender. No organomegaly or masses felt. Normal bowel sounds heard. Central nervous system: Alert and oriented. No focal neurological deficits. Extremities: no edema   Data Reviewed: I have personally reviewed following  labs and imaging studies  CBC: Recent Labs  Lab 09/05/22 1947 09/06/22 0557 09/07/22 0749 09/08/22 0925  WBC 7.6 5.6 7.4 8.4  NEUTROABS 5.4  --  5.2 6.6  HGB 11.4* 11.4* 12.1* 13.1  HCT 34.4* 36.2* 38.1* 41.1   MCV 90.8 96.3 93.6 93.8  PLT 354 310 335 359   Basic Metabolic Panel: Recent Labs  Lab 09/05/22 1947 09/06/22 0557 09/07/22 0749 09/08/22 0925  NA 127* 131* 133* 128*  K 4.3 4.3 4.4 4.0  CL 96* 99 99 95*  CO2 25 23 26 22   GLUCOSE 105* 99 99 112*  BUN 16 11 11 12   CREATININE 0.65 0.65 0.59* 0.68  CALCIUM 9.5 9.4 9.3 9.2   GFR: Estimated Creatinine Clearance: 77.5 mL/min (by C-G formula based on SCr of 0.68 mg/dL). Liver Function Tests: No results for input(s): "AST", "ALT", "ALKPHOS", "BILITOT", "PROT", "ALBUMIN" in the last 168 hours. No results for input(s): "LIPASE", "AMYLASE" in the last 168 hours. No results for input(s): "AMMONIA" in the last 168 hours. Coagulation Profile: No results for input(s): "INR", "PROTIME" in the last 168 hours. Cardiac Enzymes: No results for input(s): "CKTOTAL", "CKMB", "CKMBINDEX", "TROPONINI" in the last 168 hours. BNP (last 3 results) No results for input(s): "PROBNP" in the last 8760 hours. HbA1C: No results for input(s): "HGBA1C" in the last 72 hours. CBG: No results for input(s): "GLUCAP" in the last 168 hours. Lipid Profile: No results for input(s): "CHOL", "HDL", "LDLCALC", "TRIG", "CHOLHDL", "LDLDIRECT" in the last 72 hours. Thyroid Function Tests: Recent Labs    09/06/22 0557  TSH 2.796   Anemia Panel: No results for input(s): "VITAMINB12", "FOLATE", "FERRITIN", "TIBC", "IRON", "RETICCTPCT" in the last 72 hours. Sepsis Labs: No results for input(s): "PROCALCITON", "LATICACIDVEN" in the last 168 hours.  Recent Results (from the past 240 hour(s))  Urine Culture (for pregnant, neutropenic or urologic patients or patients with an indwelling urinary catheter)     Status: Abnormal (Preliminary result)   Collection Time: 09/06/22 10:42 PM   Specimen: Urine, Clean Catch  Result Value Ref Range Status   Specimen Description   Final    URINE, CLEAN CATCH Performed at [facility], [address]  [place], [place] [address]    Special Requests   Final    NONE Performed at [facility], [address], [place], [place] [address]    Culture (A)  Final    >=100,000 COLONIES/mL GRAM NEGATIVE RODS CULTURE REINCUBATED FOR BETTER GROWTH SUSCEPTIBILITIES TO FOLLOW Performed at [facility], [address] [place], [place] [address]    Report Status PENDING  Incomplete         Radiology Studies: US RENAL  Result Date: 09/06/2022 CLINICAL DATA:  Hematuria EXAM: RENAL / URINARY TRACT ULTRASOUND COMPLETE COMPARISON:  01/11/2021 abdominal CT FINDINGS: Right Kidney: Renal measurements: 9 x 5 x 5 cm = volume: 116 mL. No hydronephrosis or evidence of mass. Left Kidney: Renal measurements: 9 x 6 x 5 cm = volume: 130 mL. Echogenicity within normal limits. No mass or hydronephrosis visualized. Bladder: Not visualized.  History of catheter with presumed decompression. IMPRESSION: 1. Symmetric and unremarkable kidneys. 2. Nonvisualized bladder. Electronically Signed   By: Tiburcio Pea M.D.   On: 09/06/2022 14:53        Scheduled Meds:  amLODipine  10 mg Oral Daily   atorvastatin  40 mg Oral Daily   levothyroxine  125 mcg Oral Q0600   lisinopril  40 mg Oral Daily  pantoprazole  20 mg Oral Daily   Continuous Infusions:   LOS: 2 days    Time spent: 39 min  Alwyn Ren, MD  09/08/2022, 11:24 AM

## 2022-09-08 NOTE — Progress Notes (Signed)
Physical Therapy Treatment Patient Details Name: [name] MRN: [id number] DOB: 09/11/1932 Today's Date: 09/08/2022   History of Present Illness [name] is a 89 y.o. male with medical history significant of   prostate cancer, HTN, HLD, hypothyroid, GERD, allergic rhinitis who presents 09/05/22  with generalized weakness at home as well as progressive hematuria over the last 2 to 3 days.    PT Comments  Patient's family present, reporting that patient is  experiencing leg jerking and pain when legs touched. Patient presents with significant change in  MS and mobility . Patient ambulated x 60' yesterday. Today total assist to roll. Did not attempt further activity. Family reports significant  care requirements for patient PTA by family. Noted order for xray Left  knee pending. Hopefully patient will return to  level  of mobility noted yesterday.  Patient will benefit from continued inpatient follow up therapy, <3 hours/day.      If plan is discharge home, recommend the following: A little help with walking and/or transfers;A little help with bathing/dressing/bathroom;Assistance with cooking/housework;Assist for transportation;Help with stairs or ramp for entrance   Can travel by private vehicle        Equipment Recommendations  None recommended by PT    Recommendations for Other Services       Precautions / Restrictions Precautions Precautions: Fall Precaution Comments: incontinent bladder and bowel Restrictions Weight Bearing Restrictions: No     Mobility  Bed Mobility   Bed Mobility: Rolling           General bed mobility comments: patient quite resistive  to rolling to each side, indicating LE pain, total to roll and change bed.    Transfers                        Ambulation/Gait                   Stairs             Wheelchair Mobility     Tilt Bed    Modified Rankin (Stroke Patients Only)       Balance    Sitting-balance support: Feet supported   Sitting balance - Comments: unable today                                    Cognition Arousal: Alert Behavior During Therapy: Anxious, Restless Overall Cognitive Status: History of cognitive impairments - at baseline                           Safety/Judgement: Decreased awareness of deficits     General Comments: only states to  that legs are painful        Exercises      General Comments        Pertinent Vitals/Pain Pain Assessment Pain Assessment: PAINAD Breathing: occasional labored breathing, short period of hyperventilation Negative Vocalization: occasional moan/groan, low speech, negative/disapproving quality Facial Expression: sad, frightened, frown Body Language: tense, distressed pacing, fidgeting Consolability: distracted or reassured by voice/touch PAINAD Score: 5 Pain Descriptors / Indicators: Cramping, Discomfort, Grimacing Pain Intervention(s): Monitored during session, Limited activity within patient's tolerance, Premedicated before session    Home Living                          Prior Function  PT Goals (current goals can now be found in the care plan section) Progress towards PT goals: Not progressing toward goals - comment    Frequency    Min 1X/week      PT Plan      Co-evaluation              AM-PAC PT "6 Clicks" Mobility   Outcome Measure  Help needed turning from your back to your side while in a flat bed without using bedrails?: Total Help needed moving from lying on your back to sitting on the side of a flat bed without using bedrails?: Total Help needed moving to and from a bed to a chair (including a wheelchair)?: Total Help needed standing up from a chair using your arms (e.g., wheelchair or bedside chair)?: Total Help needed to walk in hospital room?: Total Help needed climbing 3-5 steps with a railing? : Total 6 Click Score:  6    End of Session   Activity Tolerance: Patient limited by pain;Treatment limited secondary to medical complications (Comment) Patient left: in bed;with call bell/phone within reach;with nursing/sitter in room;with bed alarm set Nurse Communication: Mobility status PT Visit Diagnosis: Unsteadiness on feet (R26.81);History of falling (Z91.81);Muscle weakness (generalized) (M62.81)     Time: 0865-7846 PT Time Calculation (min) (ACUTE ONLY): 20 min  Charges:    $Therapeutic Activity: 8-22 mins PT General Charges $$ ACUTE PT VISIT: 1 Visit                     Blanchard Kelch PT Acute Rehabilitation Services Office [phone] Weekend [phone]    Rada Hay 09/08/2022, 3:52 PM

## 2022-09-09 ENCOUNTER — Encounter (HOSPITAL_COMMUNITY): Payer: Self-pay | Admitting: Internal Medicine

## 2022-09-09 DIAGNOSIS — R531 Weakness: Secondary | ICD-10-CM | POA: Diagnosis not present

## 2022-09-09 LAB — COMPREHENSIVE METABOLIC PANEL
ALT: 12 U/L (ref 0–44)
AST: 19 U/L (ref 15–41)
Albumin: 2.9 g/dL — ABNORMAL LOW (ref 3.5–5.0)
Alkaline Phosphatase: 86 U/L (ref 38–126)
Anion gap: 8 (ref 5–15)
BUN: 12 mg/dL (ref 8–23)
CO2: 21 mmol/L — ABNORMAL LOW (ref 22–32)
Calcium: 8.6 mg/dL — ABNORMAL LOW (ref 8.9–10.3)
Chloride: 95 mmol/L — ABNORMAL LOW (ref 98–111)
Creatinine, Ser: 0.67 mg/dL (ref 0.61–1.24)
GFR, Estimated: >60 mL/min (ref >60)
Glucose, Bld: 93 mg/dL (ref 70–99)
Potassium: 4.1 mmol/L (ref 3.5–5.1)
Sodium: 124 mmol/L — ABNORMAL LOW (ref 135–145)
Total Bilirubin: 1 mg/dL (ref 0.3–1.2)
Total Protein: 6.3 g/dL — ABNORMAL LOW (ref 6.5–8.1)

## 2022-09-09 LAB — CBC WITH DIFFERENTIAL/PLATELET
Abs Immature Granulocytes: 0.05 10*3/uL (ref 0.00–0.07)
Basophils Absolute: 0 10*3/uL (ref 0.0–0.1)
Basophils Relative: 0 %
Eosinophils Absolute: 0.3 10*3/uL (ref 0.0–0.5)
Eosinophils Relative: 4 %
HCT: 38.9 % — ABNORMAL LOW (ref 39.0–52.0)
Hemoglobin: 12.5 g/dL — ABNORMAL LOW (ref 13.0–17.0)
Immature Granulocytes: 1 %
Lymphocytes Relative: 11 %
Lymphs Abs: 0.9 10*3/uL (ref 0.7–4.0)
MCH: 30.3 pg (ref 26.0–34.0)
MCHC: 32.1 g/dL (ref 30.0–36.0)
MCV: 94.2 fL (ref 80.0–100.0)
Monocytes Absolute: 0.7 10*3/uL (ref 0.1–1.0)
Monocytes Relative: 9 %
Neutro Abs: 6.2 10*3/uL (ref 1.7–7.7)
Neutrophils Relative %: 75 %
Platelets: 289 10*3/uL (ref 150–400)
RBC: 4.13 MIL/uL — ABNORMAL LOW (ref 4.22–5.81)
RDW: 15.1 % (ref 11.5–15.5)
WBC: 8.2 10*3/uL (ref 4.0–10.5)
nRBC: 0 % (ref 0.0–0.2)

## 2022-09-09 MED ORDER — GERHARDT'S BUTT CREAM
TOPICAL_CREAM | Freq: Four times a day (QID) | CUTANEOUS | Status: DC
  Administered 2022-09-09: 1 via TOPICAL
  Filled 2022-09-09: qty 1

## 2022-09-09 MED ORDER — POLYETHYLENE GLYCOL 3350 17 G PO PACK
17.0000 g | PACK | Freq: Every day | ORAL | Status: DC
  Administered 2022-09-10 – 2022-09-14 (×5): 17 g via ORAL
  Filled 2022-09-09 (×5): qty 1

## 2022-09-09 MED ORDER — METRONIDAZOLE 500 MG/100ML IV SOLN
500.0000 mg | Freq: Two times a day (BID) | INTRAVENOUS | Status: DC
  Administered 2022-09-09 – 2022-09-12 (×7): 500 mg via INTRAVENOUS
  Filled 2022-09-09 (×7): qty 100

## 2022-09-09 NOTE — TOC Initial Note (Addendum)
Transition of Care [facility]) - Initial/Assessment Note    Patient Details  Name: [name] MRN: [id number] Date of Birth: February 27, 1932  [facility]) CM/SW Contact:    Beckie Busing, RN Phone [phone]  09/09/2022, 3:23 PM  Clinical Narrative:                 [facility] acknowledges consult for SNF placement. CM at bedside patient is unable to participate in assessment. CM spoke with wife who gives verbal consent to speak with daughter Sunny Schlein. CM has explained referral and daughter is in agreement with SNF placement. CM to initiate SNF workup.   1600 FL2 complete info faxed out for bed offers   Expected Discharge Plan: Skilled Nursing Facility Barriers to Discharge: Continued Medical Work up, SNF Pending bed offer   Patient Goals and CMS Choice Patient states their goals for this hospitalization and ongoing recovery are:: TO go to SNF for rehab CMS Medicare.gov Compare Post Acute Care list provided to:: Other (Comment Required) (daughter Sunny Schlein) Choice offered to / list presented to : Spouse, Adult Children [employer] ownership interest in [facility].provided to:: Adult Children    Expected Discharge Plan and Services In-house Referral: NA Discharge Planning Services: CM Consult Post Acute Care Choice: Nursing Home Living arrangements for the past 2 months: Single Family Home                 DME Arranged: N/A DME Agency: NA       HH Arranged: NA HH Agency: NA        Prior Living Arrangements/Services Living arrangements for the past 2 months: Single Family Home Lives with:: Spouse Patient language and need for interpreter reviewed:: Yes Do you feel safe going back to the place where you live?: No   Needs rehab per wife/ daughter  Need for Family Participation in Patient Care: Yes (Comment) Care giver support system in place?: Yes (comment) Current home services:  (n/a)    Activities of Daily Living Home Assistive  Devices/Equipment: Cane (specify quad or straight) ADL Screening (condition at time of admission) Patient's cognitive ability adequate to safely complete daily activities?: No Is the patient deaf or have difficulty hearing?: Yes Does the patient have difficulty seeing, even when wearing glasses/contacts?: No Does the patient have difficulty concentrating, remembering, or making decisions?: No Patient able to express need for assistance with ADLs?: Yes Does the patient have difficulty dressing or bathing?: No Independently performs ADLs?: Yes (appropriate for developmental age) Does the patient have difficulty walking or climbing stairs?: No Weakness of Legs: Right Weakness of Arms/Hands: None  Permission Sought/Granted   Permission granted to share information with : Yes, Verbal Permission Granted  Share Information with NAME: Sunny Schlein [phone]     Permission granted to share info w Relationship: daughter  Permission granted to share info w Contact Information: [phone]  Emotional Assessment Appearance:: Appears stated age Attitude/Demeanor/Rapport: Gracious Affect (typically observed): Quiet, Pleasant Orientation: : Oriented to Self Alcohol / Substance Use: Not Applicable Psych Involvement: No (comment)  Admission diagnosis:  Generalized weakness [R53.1] Hematuria, unspecified type [R31.9] Patient Active Problem List   Diagnosis Date Noted   Hematuria 09/06/2022   Generalized weakness 09/05/2022   Prostate cancer (HCC) 06/23/2022   Malignant neoplasm of unspecified part of unspecified bronchus or lung (HCC) 06/25/2021   Mass of upper lobe of left lung 01/27/2021   Aortic atherosclerosis (HCC) 09/10/2020   Dizziness 09/10/2020   Osteoarthritis of right knee 12/29/2017  Pain in right knee 03/01/2017   Multiple lacunar infarcts (HCC) 09/30/2016   TIA (transient ischemic attack) 04/03/2016   Metabolic syndrome 01/15/2016   Osteopenia of neck of left femur  01/15/2016   H/O prostate cancer 06/27/2015   Obesity (BMI 30.0-34.9) 12/27/2014   Essential hypertension 07/04/2012   Hyperlipidemia 07/04/2012   Hypothyroidism 07/04/2012   Bladder neck contracture 06/22/2012   Radiation proctitis 08/11/2010   Anal stenosis 08/11/2010   Personal history of colonic polyps 08/11/2010   PCP:  Mechele Claude, MD Pharmacy:   [facility] [address], [place] - [address] [place] [address] Phone: [phone] Fax: [phone]  [facility] [address] [place] [address] Phone: [phone] Fax: [phone]     Social Determinants of Health (SDOH) Social History: SDOH Screenings   Food Insecurity: No Food Insecurity (09/06/2022)  Housing: Low Risk  (09/06/2022)  Transportation Needs: No Transportation Needs (09/06/2022)  Utilities: Not At Risk (09/06/2022)  Alcohol Screen: Low Risk  (02/19/2022)  Depression (PHQ2-9): Low Risk  (06/23/2022)  Recent Concern: Depression (PHQ2-9) - Medium Risk (04/15/2022)  Financial Resource Strain: Low Risk  (02/19/2022)  Physical Activity: Inactive (02/19/2022)  Social Connections: Moderately Integrated (02/19/2022)  Stress: No Stress Concern Present (02/19/2022)  Tobacco Use: Medium Risk (09/09/2022)   SDOH Interventions:     Readmission Risk Interventions    09/09/2022    3:11 PM  Readmission Risk Prevention Plan  Transportation Screening Complete  PCP or Specialist Appt within 5-7 Days Complete  Home Care Screening Complete  Medication Review (RN CM) Complete

## 2022-09-09 NOTE — [place] FL2 (Signed)
[place] MEDICAID FL2 LEVEL OF CARE FORM     IDENTIFICATION  Patient Name: [name] Birthdate: [birth year] Sex: male Admission Date (Current Location): 09/05/2022  [facility] and [place] Number:  Producer, television/film/video and Address:  [facility],  [address] [place], [place] [address]      Provider Number: [id number]  Attending Physician Name and Address:  Alwyn Ren, MD  Relative Name and Phone Number:  Sunny Schlein [phone]    Current Level of Care: Hospital Recommended Level of Care: Skilled Nursing Facility Prior Approval Number:    Date Approved/Denied:   PASRR Number:    Discharge Plan: SNF    Current Diagnoses: Patient Active Problem List   Diagnosis Date Noted   Hematuria 09/06/2022   Generalized weakness 09/05/2022   Prostate cancer (HCC) 06/23/2022   Malignant neoplasm of unspecified part of unspecified bronchus or lung (HCC) 06/25/2021   Mass of upper lobe of left lung 01/27/2021   Aortic atherosclerosis (HCC) 09/10/2020   Dizziness 09/10/2020   Osteoarthritis of right knee 12/29/2017   Pain in right knee 03/01/2017   Multiple lacunar infarcts (HCC) 09/30/2016   TIA (transient ischemic attack) 04/03/2016   Metabolic syndrome 01/15/2016   Osteopenia of neck of left femur 01/15/2016   H/O prostate cancer 06/27/2015   Obesity (BMI 30.0-34.9) 12/27/2014   Essential hypertension 07/04/2012   Hyperlipidemia 07/04/2012   Hypothyroidism 07/04/2012   Bladder neck contracture 06/22/2012   Radiation proctitis 08/11/2010   Anal stenosis 08/11/2010   Personal history of colonic polyps 08/11/2010    Orientation RESPIRATION BLADDER Height & Weight     Self  Normal Incontinent Weight: 98 kg Height:  6' (182.9 cm)  BEHAVIORAL SYMPTOMS/MOOD NEUROLOGICAL BOWEL NUTRITION STATUS     (n/a) Incontinent Diet  AMBULATORY STATUS COMMUNICATION OF NEEDS Skin   Extensive Assist Verbally Normal                       Personal Care  Assistance Level of Assistance  Bathing, Feeding, Dressing Bathing Assistance: Limited assistance Feeding assistance: Limited assistance Dressing Assistance: Limited assistance     Functional Limitations Info  Sight, Hearing, Speech Sight Info: Adequate Hearing Info: Adequate Speech Info: Adequate    SPECIAL CARE FACTORS FREQUENCY  PT (By licensed PT), OT (By licensed OT)     PT Frequency: 5X/week OT Frequency: 5X/week            Contractures Contractures Info: Not present    Additional Factors Info  Code Status, Allergies, Psychotropic, Insulin Sliding Scale, Isolation Precautions, Suctioning Needs Code Status Info: DNR Allergies Info: Caffeine, Codeine Psychotropic Info: see d/c summary Insulin Sliding Scale Info: see d/c summary Isolation Precautions Info: n/a Suctioning Needs: n/a   Current Medications (09/09/2022):  This is the current hospital active medication list Current Facility-Administered Medications  Medication Dose Route Frequency Provider Last Rate Last Admin   0.9 %  sodium chloride infusion   Intravenous Continuous Alwyn Ren, MD 75 mL/hr at 09/09/22 0432 Infusion Verify at 09/09/22 0432   acetaminophen (TYLENOL) tablet 650 mg  650 mg Oral Q6H PRN Reva Bores, MD   650 mg at 09/06/22 2219   Or   acetaminophen (TYLENOL) suppository 650 mg  650 mg Rectal Q6H PRN Reva Bores, MD       amLODipine (NORVASC) tablet 5 mg  5 mg Oral Daily Alwyn Ren, MD   5 mg at 09/09/22 0949   atorvastatin (LIPITOR) tablet  40 mg  40 mg Oral Daily Reva Bores, MD   40 mg at 09/09/22 0949   cefTRIAXone (ROCEPHIN) 1 g in sodium chloride 0.9 % 100 mL IVPB  1 g Intravenous Q24H Alwyn Ren, MD 200 mL/hr at 09/09/22 1312 1 g at 09/09/22 1312   cyclobenzaprine (FLEXERIL) tablet 5 mg  5 mg Oral TID PRN Alwyn Ren, MD       Gerhardt's butt cream   Topical QID Alwyn Ren, MD   Given at 09/09/22 [phone]   levothyroxine (SYNTHROID)  tablet 125 mcg  125 mcg Oral Q0600 Reva Bores, MD   125 mcg at 09/09/22 1191   lisinopril (ZESTRIL) tablet 20 mg  20 mg Oral Daily Alwyn Ren, MD   20 mg at 09/09/22 0949   metoprolol tartrate (LOPRESSOR) injection 5 mg  5 mg Intravenous Q6H PRN Reva Bores, MD       metroNIDAZOLE (FLAGYL) IVPB 500 mg  500 mg Intravenous Q12H Alwyn Ren, MD 100 mL/hr at 09/09/22 0949 500 mg at 09/09/22 0949   ondansetron (ZOFRAN) tablet 4 mg  4 mg Oral Q6H PRN Reva Bores, MD       Or   ondansetron [facility]) injection 4 mg  4 mg Intravenous Q6H PRN Reva Bores, MD       oxyCODONE (Oxy IR/ROXICODONE) immediate release tablet 10 mg  10 mg Oral Q4H PRN Alwyn Ren, MD       pantoprazole (PROTONIX) EC tablet 20 mg  20 mg Oral Daily Reva Bores, MD   20 mg at 09/09/22 0949   polyethylene glycol (MIRALAX / GLYCOLAX) packet 17 g  17 g Oral Daily Barnetta Chapel, PA-C       QUEtiapine (SEROQUEL) tablet 12.5 mg  12.5 mg Oral QHS Alwyn Ren, MD   12.5 mg at 09/08/22 2120     Discharge Medications: Please see discharge summary for a list of discharge medications.  Relevant Imaging Results:  Relevant Lab Results:   Additional Information SS# [id number]  Beckie Busing, RN

## 2022-09-09 NOTE — Care Management Important Message (Signed)
Important Message  Patient Details IM Letter given. Name: [name] MRN: [id number] Date of Birth: 02/14/1932   Medicare Important Message Given:  Yes     Caren Macadam 09/09/2022, 9:36 AM

## 2022-09-09 NOTE — Consult Note (Signed)
[name] July 02, 1932  [id number].    Requesting MD: Dr. Jerelene Redden Chief Complaint/Reason for Consult: rectal microperforation  HPI:  This is an 89 yo white male with a history of Stage IIIC lung cancer s/p radiation with good response (no path as his medical issues precluded candidacy for bx), prostate cancer, s/p prostatectomy and radiation therapy, on Lupron/enzalutamide, hematuria secondary to radiation cystitis, hypothyroidism, dementia, H/O CVA, HTN, HLD, GERD, and some urinary incontinence.  He was brought to the ED secondary to hematuria.  Urology was consulted and felt this was secondary to radiation cystitis and recommended outpatient cysto.  His hematuria has resolved.  He underwent a CT scan which revealed "Irregular and lobulated thickening of the right rectal wall concerning for malignancy. Further evaluation with colonoscopy recommended. 3. Small pockets of air extending from the rectal lumen along the right rectal wall with apparent extraluminal extension may represent focally contained microperforation. No fluid collection or abscess. 4. Several small rounded perirectal lymph nodes."  The patient currently denies any abdominal pain, states he is eating, and moving his bowels with no blood present.  He does have some dementia so a full accurate history is difficult.  Due to the findings above, we have been asked to see him.  ROS: ROS: Please see HPI  Family History  Problem Relation Age of Onset   Prostate cancer Brother    Kidney disease Brother    Congestive Heart Failure Mother    CVA Mother 69   Pancreatitis Father    Pneumonia Father    Cancer Sister        breast   Obesity Sister    Early death Sister        appendicitis   Appendicitis Sister 20       rupture   Cancer Sister        breast   Kidney disease Sister    Alzheimer's disease Brother    Hip fracture Brother     Past Medical History:  Diagnosis Date   Aphasia    Arthritis     BNC (bladder neck contracture)    BPPV (benign paroxysmal positional vertigo)    Cataract    bilateral   Diverticulosis    H/O diarrhea SECONDARY TO RADIATION THERAPY --  INTERMITANT  DIARRHEA   Hematuria    History of hemorrhagic cystitis    SECONDARY RADIATION THERAPY 1997   History of prostate cancer CURRENTLY  ELEVATED PSA--  LUPRON INJECTIONS   S/P PROSTATECTOMY AND RADIATION THERAPY  1997   Hyperlipidemia    Hypertension    Hypothyroidism    Radiation cystitis    Radiation proctitis    Urinary incontinence    Urinary retention    Vitamin D deficiency     Past Surgical History:  Procedure Laterality Date   CATARACT EXTRACTION W/ INTRAOCULAR LENS IMPLANT Right    COLONOSCOPY W/ POLYPECTOMY  2005; 08/11/2010   2005: 1 cm hyperplastic sigmoid polyp, Dr. Laural Benes 2012: hyperplastic splenic flexure polyp -1 cm, diverticulosis, radiation proctitis, anal stenosis   CYSTO/ FULGERATION OF BLEEDERS/ RESECTION BLADDER NECK CONTRACTURE  07-06-2004   BNC AND RADIATION CYSTITIS   EYE SURGERY     KNEE ARTHROSCOPY Right    LUMBAR DISC SURGERY  1996   L4 -- L5   PROSTATECTOMY  1997   SPINE SURGERY     TRANSURETHRAL RESECTION OF BLADDER NECK N/A 06/23/2012   Procedure: TRANSURETHRAL RESECTION OF BLADDER NECK CONTRACTURE WITH GYRUS;  Surgeon: Loraine Leriche  Collier Salina, MD;  Location: [facility];  Service: Urology;  Laterality: N/A;   TRANSURETHRAL RESECTION OF BLADDER TUMOR WITH GYRUS (TURBT-GYRUS) N/A 06/01/2013   Procedure: TRANSURETHRAL RESECTION OF BLADDER NECK CONTRACTURE WITH GYRUS (TURBT-GYRUS) AND INJECTION OF KENALOG;  Surgeon: Garnett Farm, MD;  Location: [facility];  Service: Urology;  Laterality: N/A;    Social History:  reports that he quit smoking about 59 years ago. His smoking use included cigarettes. He started smoking about 79 years ago. He has been exposed to tobacco smoke. He quit smokeless tobacco use about 59 years ago.  His smokeless tobacco use  included chew. He reports that he does not drink alcohol and does not use drugs.  Allergies:  Allergies  Allergen Reactions   Caffeine Palpitations   Codeine Palpitations    Medications Prior to Admission  Medication Sig Dispense Refill   amLODipine (NORVASC) 10 MG tablet Take 1 tablet (10 mg total) by mouth daily. 90 tablet 3   aspirin 325 MG tablet Take 1 tablet (325 mg total) by mouth daily.     atorvastatin (LIPITOR) 40 MG tablet Take 1 tablet by mouth once daily 90 tablet 0   azelastine (ASTELIN) 0.1 % nasal spray Place 2 sprays into both nostrils 2 (two) times daily. Use in each nostril as directed 30 mL 12   Cholecalciferol (VITAMIN D3) 5000 UNITS CAPS Take 1 capsule by mouth daily.     enzalutamide (XTANDI) 40 MG tablet Take 160 mg by mouth daily.     Leuprolide Acetate, 6 Month, (LUPRON) 45 MG injection Inject 45 mg into the muscle every 6 (six) months.     levothyroxine (SYNTHROID) 125 MCG tablet Take 1 tablet (125 mcg total) by mouth daily before breakfast. 90 tablet 1   lisinopril (ZESTRIL) 40 MG tablet Take 1 tablet by mouth once daily 90 tablet 0   loratadine (CLARITIN) 10 MG tablet Take 10 mg by mouth daily.     Magnesium 300 MG CAPS Take 1 capsule by mouth daily.     Multiple Vitamins-Minerals (CENTRUM SILVER ULTRA MENS PO) Take 1 tablet by mouth daily.     pantoprazole (PROTONIX) 20 MG tablet Take 1 tablet (20 mg total) by mouth daily. For stomach 90 tablet 1   Probiotic Product (PROBIOTIC DAILY PO) Take 1 capsule by mouth daily.     sodium chloride (OCEAN) 0.65 % SOLN nasal spray Place 1 spray into both nostrils as needed for congestion.     cephALEXin (KEFLEX) 500 MG capsule Take 1 capsule (500 mg total) by mouth 4 (four) times daily. (Patient not taking: Reported on 09/06/2022) 28 capsule 0     Physical Exam: Blood pressure (!) 147/64, pulse 75, temperature 97.9 F (36.6 C), temperature source Oral, resp. rate 18, height 6' (1.829 m), weight 98 kg, SpO2  94%. General: pleasant, WD, WN white male who is laying in bed in NAD HEENT: head is normocephalic, atraumatic.  Sclera are noninjected.  PERRL.  Ears and nose without any masses or lesions.  Mouth is pink and moist Abd: soft, NT, ND, +BS, no masses, hernias, or organomegaly Rectal: DRE performed and difficult as his anus is very tight.  I could only get about a centimeter of my index finger through the orifice.  This was clearly stenosed and with what small amount I could feel internally, this is very abnormal tissue that is thickened.  There was no blood present on my finger.  He does have incontinence of  his stool with excoriation of his perirectal area.  A Chaperone, his nurse, was present throughout the entirety of this procedure) Psych: Alert, says he is in [place] at first and struggles to come up with hospital, 1924 is the year, and Jackquline Bosch is the president.   Results for orders placed or performed during the hospital encounter of 09/05/22 (from the past 48 hour(s))  Basic metabolic panel     Status: Abnormal   Collection Time: 09/08/22  9:25 AM  Result Value Ref Range   Sodium 128 (L) 135 - 145 mmol/L   Potassium 4.0 3.5 - 5.1 mmol/L   Chloride 95 (L) 98 - 111 mmol/L   CO2 22 22 - 32 mmol/L   Glucose, Bld 112 (H) 70 - 99 mg/dL    Comment: Glucose reference range applies only to samples taken after fasting for at least 8 hours.   BUN 12 8 - 23 mg/dL   Creatinine, Ser 4.01 0.61 - 1.24 mg/dL   Calcium 9.2 8.9 - 02.7 mg/dL   GFR, Estimated >25 >36 mL/min    Comment: (NOTE) Calculated using the CKD-EPI Creatinine Equation (2021)    Anion gap 11 5 - 15    Comment: Performed at [facility], [address], [place], [place] [address]  CBC with Differential/Platelet     Status: None   Collection Time: 09/08/22  9:25 AM  Result Value Ref Range   WBC 8.4 4.0 - 10.5 K/uL   RBC 4.38 4.22 - 5.81 MIL/uL   Hemoglobin 13.1 13.0 - 17.0 g/dL   HCT 47.4 25.9 - 56.3 %   MCV 93.8  80.0 - 100.0 fL   MCH 29.9 26.0 - 34.0 pg   MCHC 31.9 30.0 - 36.0 g/dL   RDW 87.5 64.3 - 32.9 %   Platelets 359 150 - 400 K/uL   nRBC 0.0 0.0 - 0.2 %   Neutrophils Relative % 77 %   Neutro Abs 6.6 1.7 - 7.7 K/uL   Lymphocytes Relative 11 %   Lymphs Abs 0.9 0.7 - 4.0 K/uL   Monocytes Relative 7 %   Monocytes Absolute 0.6 0.1 - 1.0 K/uL   Eosinophils Relative 3 %   Eosinophils Absolute 0.2 0.0 - 0.5 K/uL   Basophils Relative 1 %   Basophils Absolute 0.0 0.0 - 0.1 K/uL   Immature Granulocytes 1 %   Abs Immature Granulocytes 0.04 0.00 - 0.07 K/uL    Comment: Performed at [facility], [address] [place], [place] [address]  CBC with Differential/Platelet     Status: Abnormal   Collection Time: 09/09/22  8:07 AM  Result Value Ref Range   WBC 8.2 4.0 - 10.5 K/uL   RBC 4.13 (L) 4.22 - 5.81 MIL/uL   Hemoglobin 12.5 (L) 13.0 - 17.0 g/dL   HCT 16.6 (L) 06.3 - 01.6 %   MCV 94.2 80.0 - 100.0 fL   MCH 30.3 26.0 - 34.0 pg   MCHC 32.1 30.0 - 36.0 g/dL   RDW 01.0 93.2 - 35.5 %   Platelets 289 150 - 400 K/uL   nRBC 0.0 0.0 - 0.2 %   Neutrophils Relative % 75 %   Neutro Abs 6.2 1.7 - 7.7 K/uL   Lymphocytes Relative 11 %   Lymphs Abs 0.9 0.7 - 4.0 K/uL   Monocytes Relative 9 %   Monocytes Absolute 0.7 0.1 - 1.0 K/uL   Eosinophils Relative 4 %   Eosinophils Absolute 0.3 0.0 - 0.5  K/uL   Basophils Relative 0 %   Basophils Absolute 0.0 0.0 - 0.1 K/uL   Immature Granulocytes 1 %   Abs Immature Granulocytes 0.05 0.00 - 0.07 K/uL    Comment: Performed at [facility], [address], [place], [place] [address]  Comprehensive metabolic panel     Status: Abnormal   Collection Time: 09/09/22  8:07 AM  Result Value Ref Range   Sodium 124 (L) 135 - 145 mmol/L   Potassium 4.1 3.5 - 5.1 mmol/L   Chloride 95 (L) 98 - 111 mmol/L   CO2 21 (L) 22 - 32 mmol/L   Glucose, Bld 93 70 - 99 mg/dL    Comment: Glucose reference range applies only to samples taken  after fasting for at least 8 hours.   BUN 12 8 - 23 mg/dL   Creatinine, Ser 3.08 0.61 - 1.24 mg/dL   Calcium 8.6 (L) 8.9 - 10.3 mg/dL   Total Protein 6.3 (L) 6.5 - 8.1 g/dL   Albumin 2.9 (L) 3.5 - 5.0 g/dL   AST 19 15 - 41 U/L   ALT 12 0 - 44 U/L   Alkaline Phosphatase 86 38 - 126 U/L   Total Bilirubin 1.0 0.3 - 1.2 mg/dL   GFR, Estimated >65 >78 mL/min    Comment: (NOTE) Calculated using the CKD-EPI Creatinine Equation (2021)    Anion gap 8 5 - 15    Comment: Performed at [facility], [address] [place], [place] [address]   CT ABDOMEN PELVIS W WO CONTRAST  Result Date: 09/08/2022 CLINICAL DATA:  Hematuria. EXAM: CT ABDOMEN AND PELVIS WITHOUT AND WITH CONTRAST TECHNIQUE: Multidetector CT imaging of the abdomen and pelvis was performed following the standard protocol before and following the bolus administration of intravenous contrast. RADIATION DOSE REDUCTION: This exam was performed according to the departmental dose-optimization program which includes automated exposure control, adjustment of the mA and/or kV according to patient size and/or use of iterative reconstruction technique. CONTRAST:  OMNIPAQUE IOHEXOL 300 MG/ML  SOLN COMPARISON:  None Available. FINDINGS: Evaluation of this exam is limited due to respiratory motion artifact. Lower chest: Bibasilar subpleural atelectasis/scarring. There is coronary vascular calcification. No intra-abdominal free air or free fluid. Hepatobiliary: The liver is unremarkable. No biliary dilatation. The gallbladder is unremarkable. Pancreas: Unremarkable. No pancreatic ductal dilatation or surrounding inflammatory changes. Spleen: Normal in size without focal abnormality. Adrenals/Urinary Tract: The adrenal glands are unremarkable. There is no hydronephrosis or nephrolithiasis on either side. Indeterminate 2 cm hypodense lesion from the anterior lower pole of the left kidney which is not characterized on this CT due to motion  artifact but present on the prior CT of 2023 most consistent with a cyst. There is symmetric enhancement and excretion of contrast by both kidneys. The visualized ureters appear unremarkable. The urinary bladder is minimally distended. Mild thickened appearance of the bladder wall may be related to underdistention or represent chronic bladder outlet obstruction. Stomach/Bowel: There is severe sigmoid diverticulosis without active inflammatory changes. There is irregular and lobulated thickening of the right rectal wall measuring approximately 2.4 cm in thickness (78/4) concerning for malignancy. Further evaluation with colonoscopy recommended. Several small pockets of air along the right lateral rectal wall may represent air within ulceration or focally contained perforation. There is no bowel obstruction. The appendix is normal. Vascular/Lymphatic: Advanced aortoiliac atherosclerotic disease. The IVC is unremarkable. No portal venous gas. Several rounded subcentimeter lymph nodes in the posterior pelvis along the medial rectum  measure up to 7 mm in short axis. No adenopathy. Reproductive: Prostatectomy. The left testicle is located in the inguinal canal. Other: Midline vertical anterior pelvic wall incisional scar. Musculoskeletal: Osteopenia with severe degenerative changes. Age indeterminate L2 and L4 compression fractures, new since the lumbar CT of 01/12/2021. Correlation with clinical exam and point tenderness recommended. IMPRESSION: 1. No hydronephrosis or nephrolithiasis. 2. Irregular and lobulated thickening of the right rectal wall concerning for malignancy. Further evaluation with colonoscopy recommended. 3. Small pockets of air extending from the rectal lumen along the right rectal wall with apparent extraluminal extension may represent focally contained microperforation. No fluid collection or abscess. 4. Several small rounded perirectal lymph nodes. 5. Severe sigmoid diverticulosis. No bowel  obstruction. Normal appendix. 6. Age indeterminate L2 and L4 compression fractures, new since the lumbar CT of 01/12/2021. Correlation with clinical exam and point tenderness recommended. 7.  Aortic Atherosclerosis (ICD10-I70.0). Electronically Signed   By: Elgie Collard M.D.   On: 09/08/2022 22:18   DG Knee 1-2 Views Left  Result Date: 09/08/2022 CLINICAL DATA:  Pain EXAM: LEFT KNEE - 1-2 VIEW COMPARISON:  None Available. FINDINGS: No fracture, dislocation, or definite effusion. Motion degradation of the lateral projection. Narrowing of the articular cartilage in the medial compartment. Medial and lateral chondrocalcinosis. Marginal spurring from the patellar articular surface. IMPRESSION: 1. No acute findings. 2. Degenerative changes as above. Electronically Signed   By: Corlis Leak M.D.   On: 09/08/2022 16:48      Assessment/Plan Rectal thickening with microperforation The patient has been seen, examined, chart, labs, vitals, and imaging personally reviewed.  The patient has rectal thickening with anal stenosis and some fecal incontinence.  It is difficult to determine if this is related to malignancy vs radiation side effect.  He is not completely obstructed, but given the stenosis of his anus, I will place him on miralax to keep his stools soft and able to pass.  We have also ordered Gerhardt's butt cream to help with excoriation.  Recommend 10 days of abx therapy.  He is already on Rocephin/Flagyl.  This can be transitioned to Augmentin when ready for oral abxs.  Ultimately, the recommendation is for GI evaluation as an outpatient after microperf heals, for flex sig, rigid procto, to biopsy this area to determine if this is malignant vs radiation induced.  Pending results, follow up with oncology.  However, the patient is elderly and has some dementia with other medical issues.  I do think it would be reasonable, at some point, to have discussions with the family moving forward about how aggressive  they would like to be in this work up.  There is no role for surgery at this time.  I have discussed this with the primary service.  We will be available if needed.   FEN - HH diet VTE - none due to hematuria ID - Rocephin/Flagyl   Stage IIIc lung cancer Prostate cancer - on Lupron/enzalutamide HTN HLD Dementia GERD H/O CVA Radiation cystitis with hematuria - improved  I reviewed Consultant oncology (in chart), urology notes, hospitalist notes, last 24 h vitals and pain scores, last 48 h intake and output, last 24 h labs and trends, and last 24 h imaging results.  Letha Cape, [facility] Surgery 09/09/2022, 9:39 AM Please see Amion for pager number during day hours 7:00am-4:30pm or 7:00am -11:30am on weekends

## 2022-09-09 NOTE — Plan of Care (Signed)

## 2022-09-09 NOTE — Progress Notes (Signed)
PROGRESS NOTE    [name]  NGE:[id number] DOB: 1932/04/22 DOA: 09/05/2022 PCP: Mechele Claude, MD    Brief Narrative: 89 y.o. male with medical history significant for prostate cancer, HTN, HLD, hypothyroidism, vertigo, hemorrhagic cystitis, urinary incontinence, GERD who presents with generalized weakness at home as well as progressive hematuria over the last 2 to 3 days.  Patient reports being treated for UTI approximately 2 weeks ago completing treatment about 2 days ago. In the ED, pt was noted to require 2-3 people to help get him up and around and it was felt that outpatient evaluation of his ongoing hematuria would be unable to be completed. Urology was consulted by the EDP.  Patient admitted for further management.    Assessment & Plan:   Principal Problem:   Generalized weakness Active Problems:   Essential hypertension   Hyperlipidemia   Hypothyroidism   Multiple lacunar infarcts (HCC)   Malignant neoplasm of unspecified part of unspecified bronchus or lung (HCC)   Prostate cancer (HCC)   Hematuria  Hematuria-urine culture this admission with E Coli follow-up final culture and sensitivity.  Start Rocephin today. Renal/bladder ultrasound unremarkable Urology consulted, recommend repeat urine culture, outpatient follow-up, signed off   Generalized weakness Possibly multifactorial, due to deconditioning PT/OT-recommend home health Fall precautions   History of prostate cancer S/p prostatectomy and radiation in 1997 On Xtandi and Lupron every 6 months Follows with Urology   Malignant neoplasm of unspecified part of unspecified bronchus or lung Status post XRT with progression per oncology note   Multiple lacunar infarcts History of remote CVA On aspirin but being held due to intermittent dysuria   Hypothyroidism Continue Synthroid TSH WNL   Hyperlipidemia Continue Lipitor   Essential hypertension On amlodipine and lisinopril  ?  Rectal wall thickening  with microperforation-will continue Rocephin added Flagyl.  Appreciate surgery input.  Will discuss with GI.  Discussed with patient's family they would like to see if he can get a colonoscopy ?  Estimated body mass index is 29.3 kg/m as calculated from the following:   Height as of this encounter: 6' (1.829 m).   Weight as of this encounter: 98 kg.  DVT prophylaxis: lovenox Code Status:full  Family Communication:dw wife Disposition Plan:  Status is: Inpatient Remains inpatient appropriate because: Urinary tract infection hematuria weakness history of prostate cancer IV antibiotics   Consultants:  Urology  Procedures:none Antimicrobials:rocephin  Subjective: Patient resting in bed in no acute distress he is coughing up thick yellow phlegm  He denies abdominal pain nausea vomiting  Hematuria has cleared up Objective: Vitals:   09/08/22 0653 09/08/22 1336 09/08/22 2058 09/09/22 0442  BP: 134/68 (!) 125/105 (!) 142/62 (!) 147/64  Pulse: 72 (!) 105 74 75  Resp: 14 20 18 18   Temp: 97.8 F (36.6 C) 98.3 F (36.8 C) 97.8 F (36.6 C) 97.9 F (36.6 C)  TempSrc: Oral Oral Oral Oral  SpO2: 98% 96% 95% 94%  Weight:      Height:        Intake/Output Summary (Last 24 hours) at 09/09/2022 1321 Last data filed at 09/09/2022 0850 Gross per 24 hour  Intake 1290.56 ml  Output 1100 ml  Net 190.56 ml   Filed Weights   09/06/22 0025  Weight: 98 kg    Examination:  General exam: Appears in nad Respiratory system: Clear to auscultation. Respiratory effort normal. Cardiovascular system: S1 & S2 heard, RRR. No JVD, murmurs, rubs, gallops or clicks. No pedal edema. Gastrointestinal system: Abdomen is  nondistended, soft and nontender. No organomegaly or masses felt. Normal bowel sounds heard. Central nervous system: Alert and oriented. No focal neurological deficits. Extremities: no edema   Data Reviewed: I have personally reviewed following labs and imaging studies  CBC: Recent  Labs  Lab 09/05/22 1947 09/06/22 0557 09/07/22 0749 09/08/22 0925 09/09/22 0807  WBC 7.6 5.6 7.4 8.4 8.2  NEUTROABS 5.4  --  5.2 6.6 6.2  HGB 11.4* 11.4* 12.1* 13.1 12.5*  HCT 34.4* 36.2* 38.1* 41.1 38.9*  MCV 90.8 96.3 93.6 93.8 94.2  PLT 354 310 335 359 289   Basic Metabolic Panel: Recent Labs  Lab 09/05/22 1947 09/06/22 0557 09/07/22 0749 09/08/22 0925 09/09/22 0807  NA 127* 131* 133* 128* 124*  K 4.3 4.3 4.4 4.0 4.1  CL 96* 99 99 95* 95*  CO2 25 23 26 22  21*  GLUCOSE 105* 99 99 112* 93  BUN 16 11 11 12 12   CREATININE 0.65 0.65 0.59* 0.68 0.67  CALCIUM 9.5 9.4 9.3 9.2 8.6*   GFR: Estimated Creatinine Clearance: 77.5 mL/min (by C-G formula based on SCr of 0.67 mg/dL). Liver Function Tests: Recent Labs  Lab 09/09/22 0807  AST 19  ALT 12  ALKPHOS 86  BILITOT 1.0  PROT 6.3*  ALBUMIN 2.9*   No results for input(s): "LIPASE", "AMYLASE" in the last 168 hours. No results for input(s): "AMMONIA" in the last 168 hours. Coagulation Profile: No results for input(s): "INR", "PROTIME" in the last 168 hours. Cardiac Enzymes: No results for input(s): "CKTOTAL", "CKMB", "CKMBINDEX", "TROPONINI" in the last 168 hours. BNP (last 3 results) No results for input(s): "PROBNP" in the last 8760 hours. HbA1C: No results for input(s): "HGBA1C" in the last 72 hours. CBG: No results for input(s): "GLUCAP" in the last 168 hours. Lipid Profile: No results for input(s): "CHOL", "HDL", "LDLCALC", "TRIG", "CHOLHDL", "LDLDIRECT" in the last 72 hours. Thyroid Function Tests: No results for input(s): "TSH", "T4TOTAL", "FREET4", "T3FREE", "THYROIDAB" in the last 72 hours.  Anemia Panel: No results for input(s): "VITAMINB12", "FOLATE", "FERRITIN", "TIBC", "IRON", "RETICCTPCT" in the last 72 hours. Sepsis Labs: No results for input(s): "PROCALCITON", "LATICACIDVEN" in the last 168 hours.  Recent Results (from the past 240 hour(s))  Urine Culture (for pregnant, neutropenic or urologic  patients or patients with an indwelling urinary catheter)     Status: Abnormal (Preliminary result)   Collection Time: 09/06/22 10:42 PM   Specimen: Urine, Clean Catch  Result Value Ref Range Status   Specimen Description   Final    URINE, CLEAN CATCH Performed at [facility], [address] [place], [place] [address]    Special Requests   Final    NONE Performed at [facility], [address], [place], [place] [address]    Culture (A)  Final    >=100,000 COLONIES/mL ESCHERICHIA COLI CULTURE REINCUBATED FOR BETTER GROWTH SUSCEPTIBILITIES TO FOLLOW Performed at [facility], [address], [place], [place] [address]    Report Status PENDING  Incomplete         Radiology Studies: CT ABDOMEN PELVIS W WO CONTRAST  Result Date: 09/08/2022 CLINICAL DATA:  Hematuria. EXAM: CT ABDOMEN AND PELVIS WITHOUT AND WITH CONTRAST TECHNIQUE: Multidetector CT imaging of the abdomen and pelvis was performed following the standard protocol before and following the bolus administration of intravenous contrast. RADIATION DOSE REDUCTION: This exam was performed according to the departmental dose-optimization program which includes automated exposure control, adjustment of the mA and/or kV according to  patient size and/or use of iterative reconstruction technique. CONTRAST:  OMNIPAQUE IOHEXOL 300 MG/ML  SOLN COMPARISON:  None Available. FINDINGS: Evaluation of this exam is limited due to respiratory motion artifact. Lower chest: Bibasilar subpleural atelectasis/scarring. There is coronary vascular calcification. No intra-abdominal free air or free fluid. Hepatobiliary: The liver is unremarkable. No biliary dilatation. The gallbladder is unremarkable. Pancreas: Unremarkable. No pancreatic ductal dilatation or surrounding inflammatory changes. Spleen: Normal in size without focal abnormality. Adrenals/Urinary Tract: The adrenal glands are unremarkable. There is  no hydronephrosis or nephrolithiasis on either side. Indeterminate 2 cm hypodense lesion from the anterior lower pole of the left kidney which is not characterized on this CT due to motion artifact but present on the prior CT of 2023 most consistent with a cyst. There is symmetric enhancement and excretion of contrast by both kidneys. The visualized ureters appear unremarkable. The urinary bladder is minimally distended. Mild thickened appearance of the bladder wall may be related to underdistention or represent chronic bladder outlet obstruction. Stomach/Bowel: There is severe sigmoid diverticulosis without active inflammatory changes. There is irregular and lobulated thickening of the right rectal wall measuring approximately 2.4 cm in thickness (78/4) concerning for malignancy. Further evaluation with colonoscopy recommended. Several small pockets of air along the right lateral rectal wall may represent air within ulceration or focally contained perforation. There is no bowel obstruction. The appendix is normal. Vascular/Lymphatic: Advanced aortoiliac atherosclerotic disease. The IVC is unremarkable. No portal venous gas. Several rounded subcentimeter lymph nodes in the posterior pelvis along the medial rectum measure up to 7 mm in short axis. No adenopathy. Reproductive: Prostatectomy. The left testicle is located in the inguinal canal. Other: Midline vertical anterior pelvic wall incisional scar. Musculoskeletal: Osteopenia with severe degenerative changes. Age indeterminate L2 and L4 compression fractures, new since the lumbar CT of 01/12/2021. Correlation with clinical exam and point tenderness recommended. IMPRESSION: 1. No hydronephrosis or nephrolithiasis. 2. Irregular and lobulated thickening of the right rectal wall concerning for malignancy. Further evaluation with colonoscopy recommended. 3. Small pockets of air extending from the rectal lumen along the right rectal wall with apparent extraluminal  extension may represent focally contained microperforation. No fluid collection or abscess. 4. Several small rounded perirectal lymph nodes. 5. Severe sigmoid diverticulosis. No bowel obstruction. Normal appendix. 6. Age indeterminate L2 and L4 compression fractures, new since the lumbar CT of 01/12/2021. Correlation with clinical exam and point tenderness recommended. 7.  Aortic Atherosclerosis (ICD10-I70.0). Electronically Signed   By: Elgie Collard M.D.   On: 09/08/2022 22:18   DG Knee 1-2 Views Left  Result Date: 09/08/2022 CLINICAL DATA:  Pain EXAM: LEFT KNEE - 1-2 VIEW COMPARISON:  None Available. FINDINGS: No fracture, dislocation, or definite effusion. Motion degradation of the lateral projection. Narrowing of the articular cartilage in the medial compartment. Medial and lateral chondrocalcinosis. Marginal spurring from the patellar articular surface. IMPRESSION: 1. No acute findings. 2. Degenerative changes as above. Electronically Signed   By: Corlis Leak M.D.   On: 09/08/2022 16:48        Scheduled Meds:  amLODipine  5 mg Oral Daily   atorvastatin  40 mg Oral Daily   Gerhardt's butt cream   Topical QID   levothyroxine  125 mcg Oral Q0600   lisinopril  20 mg Oral Daily   pantoprazole  20 mg Oral Daily   polyethylene glycol  17 g Oral Daily   QUEtiapine  12.5 mg Oral QHS   Continuous Infusions:  sodium chloride 75 mL/hr at  09/09/22 0432   cefTRIAXone (ROCEPHIN)  IV 1 g (09/09/22 1312)   metronidazole 500 mg (09/09/22 0949)     LOS: 3 days    Time spent: 39 min  Alwyn Ren, MD  09/09/2022, 1:21 PM

## 2022-09-10 DIAGNOSIS — R531 Weakness: Secondary | ICD-10-CM | POA: Diagnosis not present

## 2022-09-10 LAB — CBC WITH DIFFERENTIAL/PLATELET
Abs Immature Granulocytes: 0.04 10*3/uL (ref 0.00–0.07)
Basophils Absolute: 0 10*3/uL (ref 0.0–0.1)
Basophils Relative: 0 %
Eosinophils Absolute: 0.2 10*3/uL (ref 0.0–0.5)
Eosinophils Relative: 3 %
HCT: 37.3 % — ABNORMAL LOW (ref 39.0–52.0)
Hemoglobin: 12 g/dL — ABNORMAL LOW (ref 13.0–17.0)
Immature Granulocytes: 1 %
Lymphocytes Relative: 8 %
Lymphs Abs: 0.5 10*3/uL — ABNORMAL LOW (ref 0.7–4.0)
MCH: 30.5 pg (ref 26.0–34.0)
MCHC: 32.2 g/dL (ref 30.0–36.0)
MCV: 94.9 fL (ref 80.0–100.0)
Monocytes Absolute: 0.6 10*3/uL (ref 0.1–1.0)
Monocytes Relative: 9 %
Neutro Abs: 5.5 10*3/uL (ref 1.7–7.7)
Neutrophils Relative %: 79 %
Platelets: 267 10*3/uL (ref 150–400)
RBC: 3.93 MIL/uL — ABNORMAL LOW (ref 4.22–5.81)
RDW: 14.8 % (ref 11.5–15.5)
WBC: 6.9 10*3/uL (ref 4.0–10.5)
nRBC: 0 % (ref 0.0–0.2)

## 2022-09-10 LAB — BASIC METABOLIC PANEL
Anion gap: 8 (ref 5–15)
BUN: 9 mg/dL (ref 8–23)
CO2: 20 mmol/L — ABNORMAL LOW (ref 22–32)
Calcium: 8.4 mg/dL — ABNORMAL LOW (ref 8.9–10.3)
Chloride: 99 mmol/L (ref 98–111)
Creatinine, Ser: 0.66 mg/dL (ref 0.61–1.24)
GFR, Estimated: >60 mL/min (ref >60)
Glucose, Bld: 121 mg/dL — ABNORMAL HIGH (ref 70–99)
Potassium: 4 mmol/L (ref 3.5–5.1)
Sodium: 127 mmol/L — ABNORMAL LOW (ref 135–145)

## 2022-09-10 MED ORDER — CARMEX CLASSIC LIP BALM EX OINT
TOPICAL_OINTMENT | CUTANEOUS | Status: DC | PRN
  Filled 2022-09-10: qty 10

## 2022-09-10 NOTE — Evaluation (Signed)
Physical Therapy Evaluation Patient Details Name: ELKANAH GOURNEAU MRN: [id number] DOB: 12-18-1932 Today's Date: 09/10/2022  History of Present Illness  KALYX SPADE is a 89 y.o. male with medical history significant of   prostate cancer, HTN, HLD, hypothyroid, GERD, allergic rhinitis who presents 09/05/22  with generalized weakness at home as well as progressive hematuria over the last 2 to 3 days.  Clinical Impression    Pt admitted with above diagnosis.  Pt currently with functional limitations due to the deficits listed below (see PT Problem List). Pt s/p lunch and nurse made PT aware pt c/o buttock pain and pt requesting to go back to bed. PT arrived and nurse assisted with pt requiring A x 2 for safety with mobility tasks. Pt required min A x 2 increased time and multimodal cues for sit to stand  from recline. Pt required multimodal cues and exhibited excessive trunk flexion in standing. Pt able to maintain static standing at RW with CGA and cues for 3 mins. Pt required min A to complete SPT to bed, pt required cues for safety with retograde stepping and min A for RW management. Pt demonstrated F static sitting balance EOB. Pt required max A for B LE for sit to supine and total A for repositioning in bed. Pt left in bed all needs in place. Family inquired per noted decline this week with safety and IND with functional mobility tasks and PT attributing to UTI at this time with apparent increased confusion. Daughter and spouse indicate SNF for short term rehab at time of d/c and pt will benefit from continued inpatient follow up therapy, <3 hours/day.  Pt will benefit from acute skilled PT to increase their independence and safety with mobility to allow discharge.         If plan is discharge home, recommend the following: A little help with walking and/or transfers;A little help with bathing/dressing/bathroom;Assistance with cooking/housework;Assist for transportation;Help with stairs or ramp for  entrance   Can travel by private vehicle        Equipment Recommendations None recommended by PT  Recommendations for Other Services       Functional Status Assessment Patient has had a recent decline in their functional status and demonstrates the ability to make significant improvements in function in a reasonable and predictable amount of time.     Precautions / Restrictions Precautions Precautions: Fall Precaution Comments: incontinent bladder and bowel Restrictions Weight Bearing Restrictions: No      Mobility  Bed Mobility Overal bed mobility: Needs Assistance Bed Mobility: Sit to Supine       Sit to supine: Max assist, Used rails (for B LEs)   General bed mobility comments: pt required total A x 2 for repositioning in bed    Transfers Overall transfer level: Needs assistance Equipment used: Rolling walker (2 wheels) Transfers: Sit to/from Stand, Bed to chair/wheelchair/BSC Sit to Stand: Min assist, +2 physical assistance, +2 safety/equipment, From elevated surface   Step pivot transfers: Min assist       General transfer comment: cues for safety, pt tends to release grip on RW increased time to transition L UE from recliner to RW; SPT recliner to bed, constant multimodal cues, pt able to stand for 3 mins for nurse to assist with pericare, cues for extension posture with excessive trunik flexion compounded by kyphosis    Ambulation/Gait               General Gait Details: NT due to required assist,  multimodal cues and increased time for motor processing and planning as well as pt and staff safety  Stairs            Wheelchair Mobility     Tilt Bed    Modified Rankin (Stroke Patients Only)       Balance Overall balance assessment: Needs assistance Sitting-balance support: Feet supported, Bilateral upper extremity supported Sitting balance-Leahy Scale: Fair     Standing balance support: Bilateral upper extremity supported, During  functional activity, Reliant on assistive device for balance Standing balance-Leahy Scale: Poor Standing balance comment: stabilizes on RW, excessive trunk flexion                             Pertinent Vitals/Pain Pain Assessment Pain Assessment: Faces Faces Pain Scale: Hurts even more Pain Location: pt reports bottom hurts Pain Descriptors / Indicators: Discomfort, Grimacing, Aching, Constant Pain Intervention(s): Limited activity within patient's tolerance, Monitored during session, Repositioned    Home Living Family/patient expects to be discharged to:: Private residence Living Arrangements: Spouse/significant other;Children Available Help at Discharge: Family;Available 24 hours/day Type of Home: House Home Access: Stairs to enter Entrance Stairs-Rails: Right Entrance Stairs-Number of Steps: 3   Home Layout: One level Home Equipment: Agricultural consultant (2 wheels);Cane - single point;Shower seat - built in;Grab bars - toilet;Grab bars - tub/shower;Hand held shower head Additional Comments: info from previous encounter    Prior Function Prior Level of Function : Needs assist       Physical Assist : Mobility (physical) Mobility (physical): Transfers;Gait;Stairs   Mobility Comments: pt. reports using RW ADLs Comments: requires assistance     Extremity/Trunk Assessment        Lower Extremity Assessment Lower Extremity Assessment: Generalized weakness    Cervical / Trunk Assessment Cervical / Trunk Assessment: Kyphotic  Communication   Communication Communication: Difficulty communicating thoughts/reduced clarity of speech Cueing Techniques: Verbal cues;Gestural cues;Tactile cues;Visual cues  Cognition Arousal: Alert Behavior During Therapy: Anxious, Restless Overall Cognitive Status: History of cognitive impairments - at baseline Area of Impairment: Orientation, Safety/judgement                 Orientation Level: Place, Time, Situation,  Disoriented to       Safety/Judgement: Decreased awareness of deficits     General Comments: pt appears to be confused, daughter and spouse present for a portion of tx session        General Comments      Exercises     Assessment/Plan    PT Assessment Patient needs continued PT services  PT Problem List Decreased strength;Decreased mobility;Decreased safety awareness;Decreased balance       PT Treatment Interventions DME instruction;Gait training;Functional mobility training;Therapeutic exercise;Therapeutic activities;Balance training;Patient/family education;Cognitive remediation    PT Goals (Current goals can be found in the Care Plan section)  Acute Rehab PT Goals Patient Stated Goal: agreeable to get OOB PT Goal Formulation: Patient unable to participate in goal setting Time For Goal Achievement: 09/20/22 Potential to Achieve Goals: Fair    Frequency Min 1X/week     Co-evaluation               AM-PAC PT "6 Clicks" Mobility  Outcome Measure Help needed turning from your back to your side while in a flat bed without using bedrails?: Total Help needed moving from lying on your back to sitting on the side of a flat bed without using bedrails?: A Lot Help needed moving to and from  a bed to a chair (including a wheelchair)?: A Lot Help needed standing up from a chair using your arms (e.g., wheelchair or bedside chair)?: A Lot Help needed to walk in hospital room?: Total Help needed climbing 3-5 steps with a railing? : Total 6 Click Score: 9    End of Session Equipment Utilized During Treatment: Gait belt Activity Tolerance: Patient limited by pain;Treatment limited secondary to medical complications (Comment) Patient left: in bed;with call bell/phone within reach;with nursing/sitter in room;with bed alarm set;with family/visitor present Nurse Communication: Mobility status PT Visit Diagnosis: Unsteadiness on feet (R26.81);History of falling (Z91.81);Muscle  weakness (generalized) (M62.81);Difficulty in walking, not elsewhere classified (R26.2);Pain;Other abnormalities of gait and mobility (R26.89) Pain - Right/Left:  (buttocks)    Time: 1610-9604 PT Time Calculation (min) (ACUTE ONLY): 16 min   Charges:     PT Treatments $Therapeutic Activity: 8-22 mins PT General Charges $$ ACUTE PT VISIT: 1 Visit         Johnny Bridge, PT Acute Rehab   Jacqualyn Posey 09/10/2022, 1:29 PM

## 2022-09-10 NOTE — TOC Progression Note (Addendum)
Transition of Care [facility]) - Progression Note    Patient Details  Name: [name] MRN: [id number] Date of Birth: 04-16-1932  [facility] CM/SW Contact  Beckie Busing, RN Phone [phone]  09/10/2022, 2:06 PM  Clinical Narrative:    Wife and daughter at bedside to discuss disposition plan. Daughter now requesting [facility] , [facility] or [facility]. CM will follow up with facilities for bed offers. TOC to follow for placement. Per MD note patient is not medically ready for d/c today (Urinary tract infection hematuria weakness history of prostate cancer IV antibiotics).   1420 Patient info faxed out to [facility] , [facility] and [facility] per daughters request for top choices for placement.    Expected Discharge Plan: Skilled Nursing Facility Barriers to Discharge: Continued Medical Work up, SNF Pending bed offer  Expected Discharge Plan and Services In-house Referral: NA Discharge Planning Services: CM Consult Post Acute Care Choice: Nursing Home Living arrangements for the past 2 months: Single Family Home                 DME Arranged: N/A DME Agency: NA       HH Arranged: NA HH Agency: NA         Social Determinants of Health (SDOH) Interventions SDOH Screenings   Food Insecurity: No Food Insecurity (09/06/2022)  Housing: Low Risk  (09/06/2022)  Transportation Needs: No Transportation Needs (09/06/2022)  Utilities: Not At Risk (09/06/2022)  Alcohol Screen: Low Risk  (02/19/2022)  Depression (PHQ2-9): Low Risk  (06/23/2022)  Recent Concern: Depression (PHQ2-9) - Medium Risk (04/15/2022)  Financial Resource Strain: Low Risk  (02/19/2022)  Physical Activity: Inactive (02/19/2022)  Social Connections: Moderately Integrated (02/19/2022)  Stress: No Stress Concern Present (02/19/2022)  Tobacco Use: Medium Risk (09/09/2022)    Readmission Risk Interventions    09/09/2022    3:11 PM  Readmission Risk Prevention Plan   Transportation Screening Complete  PCP or Specialist Appt within 5-7 Days Complete  Home Care Screening Complete  Medication Review (RN CM) Complete

## 2022-09-10 NOTE — Plan of Care (Signed)

## 2022-09-10 NOTE — Progress Notes (Signed)
PROGRESS NOTE    [name]  XBJ:[id number] DOB: 07/27/1932 DOA: 09/05/2022 PCP: Mechele Claude, MD    Brief Narrative: 89 y.o. male with medical history significant for prostate cancer, HTN, HLD, hypothyroidism, vertigo, hemorrhagic cystitis, urinary incontinence, GERD who presents with generalized weakness at home as well as progressive hematuria over the last 2 to 3 days.  Patient reports being treated for UTI approximately 2 weeks ago completing treatment about 2 days ago. In the ED, pt was noted to require 2-3 people to help get him up and around and it was felt that outpatient evaluation of his ongoing hematuria would be unable to be completed. Urology was consulted by the EDP.  Patient admitted for further management.    Assessment & Plan:   Principal Problem:   Generalized weakness Active Problems:   Essential hypertension   Hyperlipidemia   Hypothyroidism   Multiple lacunar infarcts (HCC)   Malignant neoplasm of unspecified part of unspecified bronchus or lung (HCC)   Prostate cancer (HCC)   Hematuria  Hematuria-urine culture this admission with E Coli follow-up final culture and sensitivity.  Continue Rocephin.  Sensitivities still pending. Renal/bladder ultrasound unremarkable Urology consulted, recommend repeat urine culture, outpatient follow-up, signed off   Generalized weakness Possibly multifactorial, due to deconditioning PT/OT-recommend home health Fall precautions   History of prostate cancer S/p prostatectomy and radiation in 1997 On Xtandi and Lupron every 6 months Follows with Urology   Malignant neoplasm of unspecified part of unspecified bronchus or lung Status post XRT with progression per oncology note   Multiple lacunar infarcts History of remote CVA On aspirin but being held due to intermittent dysuria   Hypothyroidism Continue Synthroid TSH WNL   Hyperlipidemia Continue Lipitor   Essential hypertension On amlodipine and  lisinopril  ?  Rectal wall thickening with microperforation-will continue Rocephin and Flagyl.  Appreciate surgery input.  Will discuss with GI.   Will have him follow-up with GI as an outpatient   Estimated body mass index is 29.3 kg/m as calculated from the following:   Height as of this encounter: 6' (1.829 m).   Weight as of this encounter: 98 kg.  DVT prophylaxis: lovenox Code Status:full  Family Communication:dw wife Disposition Plan:  Status is: Inpatient Remains inpatient appropriate because: Urinary tract infection hematuria weakness history of prostate cancer IV antibiotics   Consultants:  Urology  Procedures:none Antimicrobials:rocephin  Subjective: He is resting in bed awake Denies chest pain abdominal pain dysuria nausea vomiting  coughing up thick phlegm still   Hematuria has cleared up Objective: Vitals:   09/09/22 1509 09/09/22 1916 09/10/22 0338 09/10/22 1015  BP: (!) 158/71 (!) 153/63 (!) 166/72 (!) 154/72  Pulse: 89 75 76   Resp: 20 18 18    Temp: 99.3 F (37.4 C) 98.2 F (36.8 C) 98.2 F (36.8 C)   TempSrc: Oral Oral Oral   SpO2: 94% 95% 97%   Weight:      Height:        Intake/Output Summary (Last 24 hours) at 09/10/2022 1224 Last data filed at 09/10/2022 1017 Gross per 24 hour  Intake 1699.14 ml  Output 3400 ml  Net -1700.86 ml   Filed Weights   09/06/22 0025  Weight: 98 kg    Examination:  General exam: Appears in nad Respiratory system: Clear to auscultation. Respiratory effort normal. Cardiovascular system: S1 & S2 heard, RRR. No JVD, murmurs, rubs, gallops or clicks. No pedal edema. Gastrointestinal system: Abdomen is nondistended, soft and nontender. No organomegaly  or masses felt. Normal bowel sounds heard. Central nervous system: Alert and oriented. No focal neurological deficits. Extremities: no edema   Data Reviewed: I have personally reviewed following labs and imaging studies  CBC: Recent Labs  Lab 09/05/22 1947  09/06/22 0557 09/07/22 0749 09/08/22 0925 09/09/22 0807 09/10/22 0942  WBC 7.6 5.6 7.4 8.4 8.2 6.9  NEUTROABS 5.4  --  5.2 6.6 6.2 5.5  HGB 11.4* 11.4* 12.1* 13.1 12.5* 12.0*  HCT 34.4* 36.2* 38.1* 41.1 38.9* 37.3*  MCV 90.8 96.3 93.6 93.8 94.2 94.9  PLT 354 310 335 359 289 267   Basic Metabolic Panel: Recent Labs  Lab 09/06/22 0557 09/07/22 0749 09/08/22 0925 09/09/22 0807 09/10/22 0942  NA 131* 133* 128* 124* 127*  K 4.3 4.4 4.0 4.1 4.0  CL 99 99 95* 95* 99  CO2 23 26 22  21* 20*  GLUCOSE 99 99 112* 93 121*  BUN 11 11 12 12 9   CREATININE 0.65 0.59* 0.68 0.67 0.66  CALCIUM 9.4 9.3 9.2 8.6* 8.4*   GFR: Estimated Creatinine Clearance: 77.5 mL/min (by C-G formula based on SCr of 0.66 mg/dL). Liver Function Tests: Recent Labs  Lab 09/09/22 0807  AST 19  ALT 12  ALKPHOS 86  BILITOT 1.0  PROT 6.3*  ALBUMIN 2.9*   No results for input(s): "LIPASE", "AMYLASE" in the last 168 hours. No results for input(s): "AMMONIA" in the last 168 hours. Coagulation Profile: No results for input(s): "INR", "PROTIME" in the last 168 hours. Cardiac Enzymes: No results for input(s): "CKTOTAL", "CKMB", "CKMBINDEX", "TROPONINI" in the last 168 hours. BNP (last 3 results) No results for input(s): "PROBNP" in the last 8760 hours. HbA1C: No results for input(s): "HGBA1C" in the last 72 hours. CBG: No results for input(s): "GLUCAP" in the last 168 hours. Lipid Profile: No results for input(s): "CHOL", "HDL", "LDLCALC", "TRIG", "CHOLHDL", "LDLDIRECT" in the last 72 hours. Thyroid Function Tests: No results for input(s): "TSH", "T4TOTAL", "FREET4", "T3FREE", "THYROIDAB" in the last 72 hours.  Anemia Panel: No results for input(s): "VITAMINB12", "FOLATE", "FERRITIN", "TIBC", "IRON", "RETICCTPCT" in the last 72 hours. Sepsis Labs: No results for input(s): "PROCALCITON", "LATICACIDVEN" in the last 168 hours.  Recent Results (from the past 240 hour(s))  Urine Culture (for pregnant,  neutropenic or urologic patients or patients with an indwelling urinary catheter)     Status: Abnormal (Preliminary result)   Collection Time: 09/06/22 10:42 PM   Specimen: Urine, Clean Catch  Result Value Ref Range Status   Specimen Description   Final    URINE, CLEAN CATCH Performed at [facility], [address] [place], [place] [address]    Special Requests   Final    NONE Performed at [facility], [address], [place], [place] [address]    Culture (A)  Final    >=100,000 COLONIES/mL ESCHERICHIA COLI CULTURE REINCUBATED FOR BETTER GROWTH SUSCEPTIBILITIES TO FOLLOW Performed at [facility], [address], [place], [place] [address]    Report Status PENDING  Incomplete         Radiology Studies: CT ABDOMEN PELVIS W WO CONTRAST  Result Date: 09/08/2022 CLINICAL DATA:  Hematuria. EXAM: CT ABDOMEN AND PELVIS WITHOUT AND WITH CONTRAST TECHNIQUE: Multidetector CT imaging of the abdomen and pelvis was performed following the standard protocol before and following the bolus administration of intravenous contrast. RADIATION DOSE REDUCTION: This exam was performed according to the departmental dose-optimization program which includes automated exposure control, adjustment of the mA and/or kV  according to patient size and/or use of iterative reconstruction technique. CONTRAST:  OMNIPAQUE IOHEXOL 300 MG/ML  SOLN COMPARISON:  None Available. FINDINGS: Evaluation of this exam is limited due to respiratory motion artifact. Lower chest: Bibasilar subpleural atelectasis/scarring. There is coronary vascular calcification. No intra-abdominal free air or free fluid. Hepatobiliary: The liver is unremarkable. No biliary dilatation. The gallbladder is unremarkable. Pancreas: Unremarkable. No pancreatic ductal dilatation or surrounding inflammatory changes. Spleen: Normal in size without focal abnormality. Adrenals/Urinary Tract: The adrenal glands are  unremarkable. There is no hydronephrosis or nephrolithiasis on either side. Indeterminate 2 cm hypodense lesion from the anterior lower pole of the left kidney which is not characterized on this CT due to motion artifact but present on the prior CT of 2023 most consistent with a cyst. There is symmetric enhancement and excretion of contrast by both kidneys. The visualized ureters appear unremarkable. The urinary bladder is minimally distended. Mild thickened appearance of the bladder wall may be related to underdistention or represent chronic bladder outlet obstruction. Stomach/Bowel: There is severe sigmoid diverticulosis without active inflammatory changes. There is irregular and lobulated thickening of the right rectal wall measuring approximately 2.4 cm in thickness (78/4) concerning for malignancy. Further evaluation with colonoscopy recommended. Several small pockets of air along the right lateral rectal wall may represent air within ulceration or focally contained perforation. There is no bowel obstruction. The appendix is normal. Vascular/Lymphatic: Advanced aortoiliac atherosclerotic disease. The IVC is unremarkable. No portal venous gas. Several rounded subcentimeter lymph nodes in the posterior pelvis along the medial rectum measure up to 7 mm in short axis. No adenopathy. Reproductive: Prostatectomy. The left testicle is located in the inguinal canal. Other: Midline vertical anterior pelvic wall incisional scar. Musculoskeletal: Osteopenia with severe degenerative changes. Age indeterminate L2 and L4 compression fractures, new since the lumbar CT of 01/12/2021. Correlation with clinical exam and point tenderness recommended. IMPRESSION: 1. No hydronephrosis or nephrolithiasis. 2. Irregular and lobulated thickening of the right rectal wall concerning for malignancy. Further evaluation with colonoscopy recommended. 3. Small pockets of air extending from the rectal lumen along the right rectal wall with  apparent extraluminal extension may represent focally contained microperforation. No fluid collection or abscess. 4. Several small rounded perirectal lymph nodes. 5. Severe sigmoid diverticulosis. No bowel obstruction. Normal appendix. 6. Age indeterminate L2 and L4 compression fractures, new since the lumbar CT of 01/12/2021. Correlation with clinical exam and point tenderness recommended. 7.  Aortic Atherosclerosis (ICD10-I70.0). Electronically Signed   By: Elgie Collard M.D.   On: 09/08/2022 22:18   DG Knee 1-2 Views Left  Result Date: 09/08/2022 CLINICAL DATA:  Pain EXAM: LEFT KNEE - 1-2 VIEW COMPARISON:  None Available. FINDINGS: No fracture, dislocation, or definite effusion. Motion degradation of the lateral projection. Narrowing of the articular cartilage in the medial compartment. Medial and lateral chondrocalcinosis. Marginal spurring from the patellar articular surface. IMPRESSION: 1. No acute findings. 2. Degenerative changes as above. Electronically Signed   By: Corlis Leak M.D.   On: 09/08/2022 16:48        Scheduled Meds:  amLODipine  5 mg Oral Daily   atorvastatin  40 mg Oral Daily   Gerhardt's butt cream   Topical QID   levothyroxine  125 mcg Oral Q0600   lisinopril  20 mg Oral Daily   pantoprazole  20 mg Oral Daily   polyethylene glycol  17 g Oral Daily   QUEtiapine  12.5 mg Oral QHS   Continuous Infusions:  sodium chloride 75  mL/hr at 09/10/22 1023   cefTRIAXone (ROCEPHIN)  IV Stopped (09/09/22 1412)   metronidazole 500 mg (09/10/22 1024)     LOS: 4 days    Time spent: 39 min  Alwyn Ren, MD  09/10/2022, 12:24 PM

## 2022-09-11 DIAGNOSIS — R531 Weakness: Secondary | ICD-10-CM | POA: Diagnosis not present

## 2022-09-11 LAB — CBC WITH DIFFERENTIAL/PLATELET
Abs Immature Granulocytes: 0.05 10*3/uL (ref 0.00–0.07)
Basophils Absolute: 0 10*3/uL (ref 0.0–0.1)
Basophils Relative: 0 %
Eosinophils Absolute: 0.2 10*3/uL (ref 0.0–0.5)
Eosinophils Relative: 3 %
HCT: 34.3 % — ABNORMAL LOW (ref 39.0–52.0)
Hemoglobin: 10.9 g/dL — ABNORMAL LOW (ref 13.0–17.0)
Immature Granulocytes: 1 %
Lymphocytes Relative: 9 %
Lymphs Abs: 0.7 10*3/uL (ref 0.7–4.0)
MCH: 29.9 pg (ref 26.0–34.0)
MCHC: 31.8 g/dL (ref 30.0–36.0)
MCV: 94 fL (ref 80.0–100.0)
Monocytes Absolute: 0.7 10*3/uL (ref 0.1–1.0)
Monocytes Relative: 10 %
Neutro Abs: 5.4 10*3/uL (ref 1.7–7.7)
Neutrophils Relative %: 77 %
Platelets: 256 10*3/uL (ref 150–400)
RBC: 3.65 MIL/uL — ABNORMAL LOW (ref 4.22–5.81)
RDW: 14.8 % (ref 11.5–15.5)
WBC: 7 10*3/uL (ref 4.0–10.5)
nRBC: 0 % (ref 0.0–0.2)

## 2022-09-11 LAB — COMPREHENSIVE METABOLIC PANEL
ALT: 12 U/L (ref 0–44)
AST: 17 U/L (ref 15–41)
Albumin: 2.6 g/dL — ABNORMAL LOW (ref 3.5–5.0)
Alkaline Phosphatase: 73 U/L (ref 38–126)
Anion gap: 11 (ref 5–15)
BUN: 10 mg/dL (ref 8–23)
CO2: 16 mmol/L — ABNORMAL LOW (ref 22–32)
Calcium: 8.4 mg/dL — ABNORMAL LOW (ref 8.9–10.3)
Chloride: 98 mmol/L (ref 98–111)
Creatinine, Ser: 0.61 mg/dL (ref 0.61–1.24)
GFR, Estimated: >60 mL/min (ref >60)
Glucose, Bld: 121 mg/dL — ABNORMAL HIGH (ref 70–99)
Potassium: 3.9 mmol/L (ref 3.5–5.1)
Sodium: 125 mmol/L — ABNORMAL LOW (ref 135–145)
Total Bilirubin: 0.4 mg/dL (ref 0.3–1.2)
Total Protein: 5.7 g/dL — ABNORMAL LOW (ref 6.5–8.1)

## 2022-09-11 NOTE — Progress Notes (Signed)
PROGRESS NOTE    [name]  WNI:[id number] DOB: [birth year] DOA: 09/05/2022 PCP: Mechele Claude, MD    Brief Narrative: 89 y.o. male with medical history significant for prostate cancer, HTN, HLD, hypothyroidism, vertigo, hemorrhagic cystitis, urinary incontinence, GERD who presents with generalized weakness at home as well as progressive hematuria over the last 2 to 3 days.  Patient reports being treated for UTI approximately 2 weeks ago completing treatment about 2 days ago. In the ED, pt was noted to require 2-3 people to help get him up and around and it was felt that outpatient evaluation of his ongoing hematuria would be unable to be completed. Urology was consulted by the EDP.  Patient admitted for further management.    Assessment & Plan:   Principal Problem:   Generalized weakness Active Problems:   Essential hypertension   Hyperlipidemia   Hypothyroidism   Multiple lacunar infarcts (HCC)   Malignant neoplasm of unspecified part of unspecified bronchus or lung (HCC)   Prostate cancer (HCC)   Hematuria  Hematuria-urine culture this admission with E Coli follow-up final culture and sensitivity.  Continue Rocephin.  Sensitive to rocephin Renal/bladder ultrasound unremarkable Urology consulted, recommend repeat urine culture, outpatient follow-up, signed off   Generalized weakness Possibly multifactorial, due to deconditioning PT/OT-recommend home health Fall precautions   History of prostate cancer S/p prostatectomy and radiation in 1997 On Xtandi and Lupron every 6 months Follows with Urology   Malignant neoplasm of unspecified part of unspecified bronchus or lung Status post XRT with progression per oncology note   Multiple lacunar infarcts-on aspirin   Hypothyroidism Continue Synthroid TSH WNL   Hyperlipidemia Continue Lipitor   Essential hypertension On amlodipine and lisinopril  ?  Rectal wall thickening with microperforation-will continue Rocephin  and Flagyl.  Appreciate surgery input.  Will discuss with GI.   Will have him follow-up with GI as an outpatient   Estimated body mass index is 29.3 kg/m as calculated from the following:   Height as of this encounter: 6' (1.829 m).   Weight as of this encounter: 98 kg.  DVT prophylaxis: lovenox Code Status:full  Family Communication:dw wife Disposition Plan:  Status is: Inpatient Remains inpatient appropriate because: Contained rectal perforation urinary tract infection hematuria weakness history of prostate cancer IV antibiotics   Consultants:  Urology  Procedures:none Antimicrobials:rocephin  Subjective:  Occasional confusion noted hematuria resolved No events overnight Objective: Vitals:   09/10/22 2112 09/11/22 0455 09/11/22 1029 09/11/22 1252  BP: (!) 156/97 (!) 150/73 (!) 137/59 130/66  Pulse: 80 76  89  Resp: 16 16  16   Temp: 98.2 F (36.8 C) 97.9 F (36.6 C)  98.4 F (36.9 C)  TempSrc: Oral Oral    SpO2: 97% 95%  98%  Weight:      Height:        Intake/Output Summary (Last 24 hours) at 09/11/2022 1424 Last data filed at 09/11/2022 1300 Gross per 24 hour  Intake 1382.21 ml  Output 3200 ml  Net -1817.79 ml   Filed Weights   09/06/22 0025  Weight: 98 kg    Examination:  General exam: Appears in nad Respiratory system: Clear to auscultation. Respiratory effort normal. Cardiovascular system: S1 & S2 heard, RRR. No JVD, murmurs, rubs, gallops or clicks. No pedal edema. Gastrointestinal system: Abdomen is nondistended, soft and nontender. No organomegaly or masses felt. Normal bowel sounds heard. Central nervous system: Alert and oriented. No focal neurological deficits. Extremities: no edema   Data Reviewed: I have personally  reviewed following labs and imaging studies  CBC: Recent Labs  Lab 09/07/22 0749 09/08/22 0925 09/09/22 0807 09/10/22 0942 09/11/22 0949  WBC 7.4 8.4 8.2 6.9 7.0  NEUTROABS 5.2 6.6 6.2 5.5 5.4  HGB 12.1* 13.1 12.5* 12.0*  10.9*  HCT 38.1* 41.1 38.9* 37.3* 34.3*  MCV 93.6 93.8 94.2 94.9 94.0  PLT 335 359 289 267 256   Basic Metabolic Panel: Recent Labs  Lab 09/07/22 0749 09/08/22 0925 09/09/22 0807 09/10/22 0942 09/11/22 1014  NA 133* 128* 124* 127* 125*  K 4.4 4.0 4.1 4.0 3.9  CL 99 95* 95* 99 98  CO2 26 22 21* 20* 16*  GLUCOSE 99 112* 93 121* 121*  BUN 11 12 12 9 10   CREATININE 0.59* 0.68 0.67 0.66 0.61  CALCIUM 9.3 9.2 8.6* 8.4* 8.4*   GFR: Estimated Creatinine Clearance: 77.5 mL/min (by C-G formula based on SCr of 0.61 mg/dL). Liver Function Tests: Recent Labs  Lab 09/09/22 0807 09/11/22 1014  AST 19 17  ALT 12 12  ALKPHOS 86 73  BILITOT 1.0 0.4  PROT 6.3* 5.7*  ALBUMIN 2.9* 2.6*   No results for input(s): "LIPASE", "AMYLASE" in the last 168 hours. No results for input(s): "AMMONIA" in the last 168 hours. Coagulation Profile: No results for input(s): "INR", "PROTIME" in the last 168 hours. Cardiac Enzymes: No results for input(s): "CKTOTAL", "CKMB", "CKMBINDEX", "TROPONINI" in the last 168 hours. BNP (last 3 results) No results for input(s): "PROBNP" in the last 8760 hours. HbA1C: No results for input(s): "HGBA1C" in the last 72 hours. CBG: No results for input(s): "GLUCAP" in the last 168 hours. Lipid Profile: No results for input(s): "CHOL", "HDL", "LDLCALC", "TRIG", "CHOLHDL", "LDLDIRECT" in the last 72 hours. Thyroid Function Tests: No results for input(s): "TSH", "T4TOTAL", "FREET4", "T3FREE", "THYROIDAB" in the last 72 hours.  Anemia Panel: No results for input(s): "VITAMINB12", "FOLATE", "FERRITIN", "TIBC", "IRON", "RETICCTPCT" in the last 72 hours. Sepsis Labs: No results for input(s): "PROCALCITON", "LATICACIDVEN" in the last 168 hours.  Recent Results (from the past 240 hour(s))  Urine Culture (for pregnant, neutropenic or urologic patients or patients with an indwelling urinary catheter)     Status: Abnormal   Collection Time: 09/06/22 10:42 PM   Specimen:  Urine, Clean Catch  Result Value Ref Range Status   Specimen Description   Final    URINE, CLEAN CATCH Performed at [facility], [address], [place], [place] [address]    Special Requests   Final    NONE Performed at [facility], [address] [place], [place] [address]    Culture   Final    Two isolates with different morphologies were identified as the same organism.The most resistant organism was reported. >=100,000 COLONIES/mL ESCHERICHIA COLI Confirmed Extended Spectrum Beta-Lactamase Producer (ESBL).  In bloodstream infections from ESBL organisms, carbapenems are preferred over piperacillin/tazobactam. They are shown to have a lower risk of mortality.    Report Status 09/11/2022 FINAL  Final   Organism ID, Bacteria ESCHERICHIA COLI (A)  Final      Susceptibility   Escherichia coli - MIC*    AMPICILLIN >=32 RESISTANT Resistant     CEFAZOLIN >=64 RESISTANT Resistant     CEFEPIME <=0.12 SENSITIVE Sensitive     CEFTRIAXONE 0.5 SENSITIVE Sensitive     CIPROFLOXACIN <=0.25 SENSITIVE Sensitive     GENTAMICIN <=1 SENSITIVE Sensitive     IMIPENEM <=0.25 SENSITIVE Sensitive     NITROFURANTOIN <=16 SENSITIVE Sensitive  TRIMETH/SULFA <=20 SENSITIVE Sensitive     AMPICILLIN/SULBACTAM >=32 RESISTANT Resistant     PIP/TAZO 8 SENSITIVE Sensitive     * >=100,000 COLONIES/mL ESCHERICHIA COLI         Radiology Studies: No results found.      Scheduled Meds:  amLODipine  5 mg Oral Daily   atorvastatin  40 mg Oral Daily   Gerhardt's butt cream   Topical QID   levothyroxine  125 mcg Oral Q0600   lisinopril  20 mg Oral Daily   pantoprazole  20 mg Oral Daily   polyethylene glycol  17 g Oral Daily   QUEtiapine  12.5 mg Oral QHS   Continuous Infusions:  cefTRIAXone (ROCEPHIN)  IV 1 g (09/11/22 1310)   metronidazole 500 mg (09/11/22 1033)     LOS: 5 days    Time spent: 39 min  Alwyn Ren, MD  09/11/2022, 2:24 PM

## 2022-09-12 DIAGNOSIS — R531 Weakness: Secondary | ICD-10-CM | POA: Diagnosis not present

## 2022-09-12 LAB — CBC WITH DIFFERENTIAL/PLATELET
Abs Immature Granulocytes: 0.03 10*3/uL (ref 0.00–0.07)
Basophils Absolute: 0 10*3/uL (ref 0.0–0.1)
Basophils Relative: 0 %
Eosinophils Absolute: 0.5 10*3/uL (ref 0.0–0.5)
Eosinophils Relative: 9 %
HCT: 36.2 % — ABNORMAL LOW (ref 39.0–52.0)
Hemoglobin: 11.5 g/dL — ABNORMAL LOW (ref 13.0–17.0)
Immature Granulocytes: 1 %
Lymphocytes Relative: 13 %
Lymphs Abs: 0.8 10*3/uL (ref 0.7–4.0)
MCH: 30.1 pg (ref 26.0–34.0)
MCHC: 31.8 g/dL (ref 30.0–36.0)
MCV: 94.8 fL (ref 80.0–100.0)
Monocytes Absolute: 0.6 10*3/uL (ref 0.1–1.0)
Monocytes Relative: 10 %
Neutro Abs: 4.2 10*3/uL (ref 1.7–7.7)
Neutrophils Relative %: 67 %
Platelets: 265 10*3/uL (ref 150–400)
RBC: 3.82 MIL/uL — ABNORMAL LOW (ref 4.22–5.81)
RDW: 14.9 % (ref 11.5–15.5)
WBC: 6.2 10*3/uL (ref 4.0–10.5)
nRBC: 0 % (ref 0.0–0.2)

## 2022-09-12 LAB — CEA: CEA: 2.3 ng/mL (ref 0.0–4.7)

## 2022-09-12 MED ORDER — SODIUM CHLORIDE 0.9 % IV SOLN
1.0000 g | Freq: Three times a day (TID) | INTRAVENOUS | Status: DC
  Administered 2022-09-12 – 2022-09-14 (×6): 1 g via INTRAVENOUS
  Filled 2022-09-12 (×7): qty 20

## 2022-09-12 NOTE — Progress Notes (Addendum)
PROGRESS NOTE    [name]  UYQ:[id number] DOB: 12-07-1932 DOA: 09/05/2022 PCP: Mechele Claude, MD    Brief Narrative: 89 y.o. male with medical history significant for prostate cancer, HTN, HLD, hypothyroidism, vertigo, hemorrhagic cystitis, urinary incontinence, GERD who presents with generalized weakness at home as well as progressive hematuria over the last 2 to 3 days.  Patient reports being treated for UTI approximately 2 weeks ago completing treatment about 2 days ago. In the ED, pt was noted to require 2-3 people to help get him up and around and it was felt that outpatient evaluation of his ongoing hematuria would be unable to be completed. Urology was consulted by the EDP.  Patient admitted for further management.  Assessment & Plan:   Principal Problem:   Generalized weakness Active Problems:   Essential hypertension   Hyperlipidemia   Hypothyroidism   Multiple lacunar infarcts (HCC)   Malignant neoplasm of unspecified part of unspecified bronchus or lung (HCC)   Prostate cancer (HCC)   Hematuria  Hematuria-urine culture this admission with ESBL  E Coli ROCEPHIN AND FLAGYL DC 8/18 MERO STARTED 8/18 Renal/bladder ultrasound unremarkable Urology consulted, recommend repeat urine culture, outpatient follow-up, signed off   Generalized weakness Possibly multifactorial, due to deconditioning Seen by PT Family unable to care for him looking for a rehab   History of prostate cancer S/p prostatectomy and radiation in 1997 On Xtandi and Lupron every 6 months Follows with Urology   Malignant neoplasm of unspecified part of unspecified bronchus or lung Status post XRT with progression per oncology note   Multiple lacunar infarcts-on aspirin   Hypothyroidism Continue Synthroid TSH WNL   Hyperlipidemia Continue Lipitor   Essential hypertension On amlodipine and lisinopril  ?  Rectal wall thickening with microperforation-will continue Rocephin and Flagyl.   Appreciate surgery input.  Will discuss with GI.   Will have him follow-up with GI as an outpatient   Estimated body mass index is 29.3 kg/m as calculated from the following:   Height as of this encounter: 6' (1.829 m).   Weight as of this encounter: 98 kg.  DVT prophylaxis: lovenox Code Status:full  Family Communication:dw wife Disposition Plan:  Status is: Inpatient Remains inpatient appropriate because: Contained rectal perforation urinary tract infection hematuria weakness history of prostate cancer IV antibiotics   Consultants:  Urology  Procedures:none Antimicrobials:rocephin  Subjective:  Much more awake  Improved since yesterday Urine cleared up  objective: Vitals:   09/11/22 1029 09/11/22 1252 09/11/22 2029 09/12/22 0449  BP: (!) 137/59 130/66 116/62 (!) 150/57  Pulse:  89 68 63  Resp:  16 17 17   Temp:  98.4 F (36.9 C) 98.4 F (36.9 C) 97.6 F (36.4 C)  TempSrc:   Oral Oral  SpO2:  98% 92% 96%  Weight:      Height:        Intake/Output Summary (Last 24 hours) at 09/12/2022 1143 Last data filed at 09/12/2022 1044 Gross per 24 hour  Intake 1480 ml  Output 900 ml  Net 580 ml   Filed Weights   09/06/22 0025  Weight: 98 kg    Examination:  General exam: Appears in nad Respiratory system: Clear to auscultation. Respiratory effort normal. Cardiovascular system: S1 & S2 heard, RRR. No JVD, murmurs, rubs, gallops or clicks. No pedal edema. Gastrointestinal system: Abdomen is nondistended, soft and nontender. No organomegaly or masses felt. Normal bowel sounds heard. Central nervous system: Alert and oriented. No focal neurological deficits. Extremities: no edema  Data Reviewed: I have personally reviewed following labs and imaging studies  CBC: Recent Labs  Lab 09/08/22 0925 09/09/22 0807 09/10/22 0942 09/11/22 0949 09/12/22 0853  WBC 8.4 8.2 6.9 7.0 6.2  NEUTROABS 6.6 6.2 5.5 5.4 4.2  HGB 13.1 12.5* 12.0* 10.9* 11.5*  HCT 41.1 38.9* 37.3*  34.3* 36.2*  MCV 93.8 94.2 94.9 94.0 94.8  PLT 359 289 267 256 265   Basic Metabolic Panel: Recent Labs  Lab 09/07/22 0749 09/08/22 0925 09/09/22 0807 09/10/22 0942 09/11/22 1014  NA 133* 128* 124* 127* 125*  K 4.4 4.0 4.1 4.0 3.9  CL 99 95* 95* 99 98  CO2 26 22 21* 20* 16*  GLUCOSE 99 112* 93 121* 121*  BUN 11 12 12 9 10   CREATININE 0.59* 0.68 0.67 0.66 0.61  CALCIUM 9.3 9.2 8.6* 8.4* 8.4*   GFR: Estimated Creatinine Clearance: 77.5 mL/min (by C-G formula based on SCr of 0.61 mg/dL). Liver Function Tests: Recent Labs  Lab 09/09/22 0807 09/11/22 1014  AST 19 17  ALT 12 12  ALKPHOS 86 73  BILITOT 1.0 0.4  PROT 6.3* 5.7*  ALBUMIN 2.9* 2.6*   No results for input(s): "LIPASE", "AMYLASE" in the last 168 hours. No results for input(s): "AMMONIA" in the last 168 hours. Coagulation Profile: No results for input(s): "INR", "PROTIME" in the last 168 hours. Cardiac Enzymes: No results for input(s): "CKTOTAL", "CKMB", "CKMBINDEX", "TROPONINI" in the last 168 hours. BNP (last 3 results) No results for input(s): "PROBNP" in the last 8760 hours. HbA1C: No results for input(s): "HGBA1C" in the last 72 hours. CBG: No results for input(s): "GLUCAP" in the last 168 hours. Lipid Profile: No results for input(s): "CHOL", "HDL", "LDLCALC", "TRIG", "CHOLHDL", "LDLDIRECT" in the last 72 hours. Thyroid Function Tests: No results for input(s): "TSH", "T4TOTAL", "FREET4", "T3FREE", "THYROIDAB" in the last 72 hours.  Anemia Panel: No results for input(s): "VITAMINB12", "FOLATE", "FERRITIN", "TIBC", "IRON", "RETICCTPCT" in the last 72 hours. Sepsis Labs: No results for input(s): "PROCALCITON", "LATICACIDVEN" in the last 168 hours.  Recent Results (from the past 240 hour(s))  Urine Culture (for pregnant, neutropenic or urologic patients or patients with an indwelling urinary catheter)     Status: Abnormal   Collection Time: 09/06/22 10:42 PM   Specimen: Urine, Clean Catch  Result  Value Ref Range Status   Specimen Description   Final    URINE, CLEAN CATCH Performed at [facility], [address], [place], [place] [address]    Special Requests   Final    NONE Performed at [facility], [address], [place], [place] [address]    Culture   Final    Two isolates with different morphologies were identified as the same organism.The most resistant organism was reported. >=100,000 COLONIES/mL ESCHERICHIA COLI Confirmed Extended Spectrum Beta-Lactamase Producer (ESBL).  In bloodstream infections from ESBL organisms, carbapenems are preferred over piperacillin/tazobactam. They are shown to have a lower risk of mortality.    Report Status 09/11/2022 FINAL  Final   Organism ID, Bacteria ESCHERICHIA COLI (A)  Final      Susceptibility   Escherichia coli - MIC*    AMPICILLIN >=32 RESISTANT Resistant     CEFAZOLIN >=64 RESISTANT Resistant     CEFEPIME <=0.12 SENSITIVE Sensitive     CEFTRIAXONE 0.5 SENSITIVE Sensitive     CIPROFLOXACIN <=0.25 SENSITIVE Sensitive     GENTAMICIN <=1 SENSITIVE Sensitive     IMIPENEM <=0.25 SENSITIVE Sensitive     NITROFURANTOIN <=16  SENSITIVE Sensitive     TRIMETH/SULFA <=20 SENSITIVE Sensitive     AMPICILLIN/SULBACTAM >=32 RESISTANT Resistant     PIP/TAZO 8 SENSITIVE Sensitive     * >=100,000 COLONIES/mL ESCHERICHIA COLI         Radiology Studies: No results found.      Scheduled Meds:  amLODipine  5 mg Oral Daily   atorvastatin  40 mg Oral Daily   Gerhardt's butt cream   Topical QID   levothyroxine  125 mcg Oral Q0600   lisinopril  20 mg Oral Daily   pantoprazole  20 mg Oral Daily   polyethylene glycol  17 g Oral Daily   QUEtiapine  12.5 mg Oral QHS   Continuous Infusions:  cefTRIAXone (ROCEPHIN)  IV 1 g (09/11/22 1310)   metronidazole 500 mg (09/12/22 1100)     LOS: 6 days    Time spent: 39 min  Alwyn Ren, MD  09/12/2022, 11:43 AM

## 2022-09-12 NOTE — Plan of Care (Signed)
  Problem: Education: Goal: Knowledge of General Education information will improve Description Including pain rating scale, medication(s)/side effects and non-pharmacologic comfort measures Outcome: Progressing   Problem: Health Behavior/Discharge Planning: Goal: Ability to manage health-related needs will improve Outcome: Progressing   

## 2022-09-13 ENCOUNTER — Ambulatory Visit (INDEPENDENT_AMBULATORY_CARE_PROVIDER_SITE_OTHER): Payer: Medicare Other

## 2022-09-13 DIAGNOSIS — E871 Hypo-osmolality and hyponatremia: Secondary | ICD-10-CM

## 2022-09-13 DIAGNOSIS — R339 Retention of urine, unspecified: Secondary | ICD-10-CM

## 2022-09-13 DIAGNOSIS — R531 Weakness: Secondary | ICD-10-CM | POA: Diagnosis not present

## 2022-09-13 DIAGNOSIS — R32 Unspecified urinary incontinence: Secondary | ICD-10-CM

## 2022-09-13 DIAGNOSIS — M1711 Unilateral primary osteoarthritis, right knee: Secondary | ICD-10-CM

## 2022-09-13 DIAGNOSIS — I10 Essential (primary) hypertension: Secondary | ICD-10-CM | POA: Diagnosis not present

## 2022-09-13 DIAGNOSIS — H8110 Benign paroxysmal vertigo, unspecified ear: Secondary | ICD-10-CM | POA: Diagnosis not present

## 2022-09-13 DIAGNOSIS — R269 Unspecified abnormalities of gait and mobility: Secondary | ICD-10-CM | POA: Diagnosis not present

## 2022-09-13 DIAGNOSIS — E782 Mixed hyperlipidemia: Secondary | ICD-10-CM

## 2022-09-13 DIAGNOSIS — K5790 Diverticulosis of intestine, part unspecified, without perforation or abscess without bleeding: Secondary | ICD-10-CM | POA: Diagnosis not present

## 2022-09-13 DIAGNOSIS — I69398 Other sequelae of cerebral infarction: Secondary | ICD-10-CM

## 2022-09-13 DIAGNOSIS — M6281 Muscle weakness (generalized): Secondary | ICD-10-CM | POA: Diagnosis not present

## 2022-09-13 DIAGNOSIS — E559 Vitamin D deficiency, unspecified: Secondary | ICD-10-CM

## 2022-09-13 LAB — CBC WITH DIFFERENTIAL/PLATELET
Abs Immature Granulocytes: 0.04 10*3/uL (ref 0.00–0.07)
Basophils Absolute: 0 10*3/uL (ref 0.0–0.1)
Basophils Relative: 0 %
Eosinophils Absolute: 0.5 10*3/uL (ref 0.0–0.5)
Eosinophils Relative: 8 %
HCT: 38.3 % — ABNORMAL LOW (ref 39.0–52.0)
Hemoglobin: 12.4 g/dL — ABNORMAL LOW (ref 13.0–17.0)
Immature Granulocytes: 1 %
Lymphocytes Relative: 15 %
Lymphs Abs: 0.8 10*3/uL (ref 0.7–4.0)
MCH: 29.8 pg (ref 26.0–34.0)
MCHC: 32.4 g/dL (ref 30.0–36.0)
MCV: 92.1 fL (ref 80.0–100.0)
Monocytes Absolute: 0.6 10*3/uL (ref 0.1–1.0)
Monocytes Relative: 10 %
Neutro Abs: 3.8 10*3/uL (ref 1.7–7.7)
Neutrophils Relative %: 66 %
Platelets: 269 10*3/uL (ref 150–400)
RBC: 4.16 MIL/uL — ABNORMAL LOW (ref 4.22–5.81)
RDW: 14.6 % (ref 11.5–15.5)
WBC: 5.7 10*3/uL (ref 4.0–10.5)
nRBC: 0 % (ref 0.0–0.2)

## 2022-09-13 LAB — BASIC METABOLIC PANEL
Anion gap: 7 (ref 5–15)
BUN: 10 mg/dL (ref 8–23)
CO2: 23 mmol/L (ref 22–32)
Calcium: 8.9 mg/dL (ref 8.9–10.3)
Chloride: 96 mmol/L — ABNORMAL LOW (ref 98–111)
Creatinine, Ser: 0.59 mg/dL — ABNORMAL LOW (ref 0.61–1.24)
GFR, Estimated: >60 mL/min (ref >60)
Glucose, Bld: 92 mg/dL (ref 70–99)
Potassium: 4.2 mmol/L (ref 3.5–5.1)
Sodium: 126 mmol/L — ABNORMAL LOW (ref 135–145)

## 2022-09-13 MED ORDER — SODIUM BICARBONATE 650 MG PO TABS
650.0000 mg | ORAL_TABLET | Freq: Three times a day (TID) | ORAL | Status: DC
  Administered 2022-09-13 – 2022-09-14 (×3): 650 mg via ORAL
  Filled 2022-09-13 (×3): qty 1

## 2022-09-13 NOTE — TOC Progression Note (Addendum)
Transition of Care [facility]) - Progression Note    Patient Details  Name: [name] MRN: [id number] Date of Birth: [birth year]  Transition of Care [facility]) CM/SW Contact  Beckie Busing, RN Phone [phone]  09/13/2022, 3:21 PM  Clinical Narrative:    Bed offers presented to daughter Vikki Ports and wife. Family accepted bed offer  from [facility]. CM spoke with Devonne Doughty in admissions [phone] to verify bed  offer.  Plan for patient to d/c to [facility] rehabilitation on  8/20. Discharge packet has been placed in the chart.   Expected Discharge Plan: Skilled Nursing Facility Barriers to Discharge: Continued Medical Work up, SNF Pending bed offer  Expected Discharge Plan and Services In-house Referral: NA Discharge Planning Services: CM Consult Post Acute Care Choice: Nursing Home Living arrangements for the past 2 months: Single Family Home                 DME Arranged: N/A DME Agency: NA       HH Arranged: NA HH Agency: NA         Social Determinants of Health (SDOH) Interventions SDOH Screenings   Food Insecurity: No Food Insecurity (09/06/2022)  Housing: Low Risk  (09/06/2022)  Transportation Needs: No Transportation Needs (09/06/2022)  Utilities: Not At Risk (09/06/2022)  Alcohol Screen: Low Risk  (02/19/2022)  Depression (PHQ2-9): Low Risk  (06/23/2022)  Recent Concern: Depression (PHQ2-9) - Medium Risk (04/15/2022)  Financial Resource Strain: Low Risk  (02/19/2022)  Physical Activity: Inactive (02/19/2022)  Social Connections: Moderately Integrated (02/19/2022)  Stress: No Stress Concern Present (02/19/2022)  Tobacco Use: Medium Risk (09/09/2022)    Readmission Risk Interventions    09/09/2022    3:11 PM  Readmission Risk Prevention Plan  Transportation Screening Complete  PCP or Specialist Appt within 5-7 Days Complete  Home Care Screening Complete  Medication Review (RN CM) Complete

## 2022-09-13 NOTE — Progress Notes (Signed)
PROGRESS NOTE    [name]  LKG:[id number] DOB: December 29, 1932 DOA: 09/05/2022 PCP: Mechele Claude, MD    Brief Narrative: 89 y.o. male with medical history significant for prostate cancer, HTN, HLD, hypothyroidism, vertigo, hemorrhagic cystitis, urinary incontinence, GERD who presents with generalized weakness at home as well as progressive hematuria over the last 2 to 3 days.  Patient reports being treated for UTI approximately 2 weeks ago completing treatment about 2 days ago. In the ED, pt was noted to require 2-3 people to help get him up and around and it was felt that outpatient evaluation of his ongoing hematuria would be unable to be completed. Urology was consulted by the EDP.  Patient admitted for further management.  Assessment & Plan:   Principal Problem:   Generalized weakness Active Problems:   Essential hypertension   Hyperlipidemia   Hypothyroidism   Multiple lacunar infarcts (HCC)   Malignant neoplasm of unspecified part of unspecified bronchus or lung (HCC)   Prostate cancer (HCC)   Hematuria  Hematuria-urine culture this admission with ESBL  E Coli ROCEPHIN AND FLAGYL DC 8/18 MERO STARTED 8/18 Will change to bactrim on dc Renal/bladder ultrasound unremarkable Urology consulted, recommend repeat urine culture, outpatient follow-up, signed off   Generalized weakness Possibly multifactorial, due to deconditioning Seen by PT Family unable to care for him looking for a rehab   History of prostate cancer S/p prostatectomy and radiation in 1997 On Xtandi and Lupron every 6 months Follows with Urology   Malignant neoplasm of unspecified part of unspecified bronchus or lung Status post XRT with progression per oncology note   Multiple lacunar infarcts-on aspirin   Hypothyroidism Continue Synthroid TSH WNL   Hyperlipidemia Continue Lipitor   Essential hypertension On amlodipine and lisinopril  ?  Rectal wall thickening with microperforation-will  continue Rocephin and Flagyl.  Appreciate surgery input.  Will discuss with GI.   Will have him follow-up with GI as an outpatient   Estimated body mass index is 29.3 kg/m as calculated from the following:   Height as of this encounter: 6' (1.829 m).   Weight as of this encounter: 98 kg.  DVT prophylaxis: lovenox Code Status:full  Family Communication:dw wife Disposition Plan:  Status is: Inpatient Remains inpatient appropriate because: Contained rectal perforation urinary tract infection hematuria weakness history of prostate cancer IV antibiotics   Consultants:  Urology  Procedures:none Antimicrobials:rocephin  Subjective:  Much more awake  Improved since yesterday Urine cleared up  objective: Vitals:   09/12/22 2051 09/13/22 0439 09/13/22 1017 09/13/22 1403  BP: (!) 161/73 (!) 152/65 122/70 (!) 145/66  Pulse: 73 71  78  Resp: 17 18  17   Temp: 97.9 F (36.6 C) 97.7 F (36.5 C)  98.5 F (36.9 C)  TempSrc: Oral Oral    SpO2: 94% 95%  98%  Weight:      Height:        Intake/Output Summary (Last 24 hours) at 09/13/2022 1435 Last data filed at 09/13/2022 1338 Gross per 24 hour  Intake 960 ml  Output 4000 ml  Net -3040 ml   Filed Weights   09/06/22 0025  Weight: 98 kg    Examination:  General exam: Appears in nad Respiratory system: Clear to auscultation. Respiratory effort normal. Cardiovascular system: S1 & S2 heard, RRR. No JVD, murmurs, rubs, gallops or clicks. No pedal edema. Gastrointestinal system: Abdomen is nondistended, soft and nontender. No organomegaly or masses felt. Normal bowel sounds heard. Central nervous system: Alert and oriented. No  focal neurological deficits. Extremities: no edema   Data Reviewed: I have personally reviewed following labs and imaging studies  CBC: Recent Labs  Lab 09/09/22 0807 09/10/22 0942 09/11/22 0949 09/12/22 0853 09/13/22 0848  WBC 8.2 6.9 7.0 6.2 5.7  NEUTROABS 6.2 5.5 5.4 4.2 3.8  HGB 12.5* 12.0* 10.9*  11.5* 12.4*  HCT 38.9* 37.3* 34.3* 36.2* 38.3*  MCV 94.2 94.9 94.0 94.8 92.1  PLT 289 267 256 265 269   Basic Metabolic Panel: Recent Labs  Lab 09/08/22 0925 09/09/22 0807 09/10/22 0942 09/11/22 1014 09/13/22 0848  NA 128* 124* 127* 125* 126*  K 4.0 4.1 4.0 3.9 4.2  CL 95* 95* 99 98 96*  CO2 22 21* 20* 16* 23  GLUCOSE 112* 93 121* 121* 92  BUN 12 12 9 10 10   CREATININE 0.68 0.67 0.66 0.61 0.59*  CALCIUM 9.2 8.6* 8.4* 8.4* 8.9   GFR: Estimated Creatinine Clearance: 77.5 mL/min (A) (by C-G formula based on SCr of 0.59 mg/dL (L)). Liver Function Tests: Recent Labs  Lab 09/09/22 0807 09/11/22 1014  AST 19 17  ALT 12 12  ALKPHOS 86 73  BILITOT 1.0 0.4  PROT 6.3* 5.7*  ALBUMIN 2.9* 2.6*   No results for input(s): "LIPASE", "AMYLASE" in the last 168 hours. No results for input(s): "AMMONIA" in the last 168 hours. Coagulation Profile: No results for input(s): "INR", "PROTIME" in the last 168 hours. Cardiac Enzymes: No results for input(s): "CKTOTAL", "CKMB", "CKMBINDEX", "TROPONINI" in the last 168 hours. BNP (last 3 results) No results for input(s): "PROBNP" in the last 8760 hours. HbA1C: No results for input(s): "HGBA1C" in the last 72 hours. CBG: No results for input(s): "GLUCAP" in the last 168 hours. Lipid Profile: No results for input(s): "CHOL", "HDL", "LDLCALC", "TRIG", "CHOLHDL", "LDLDIRECT" in the last 72 hours. Thyroid Function Tests: No results for input(s): "TSH", "T4TOTAL", "FREET4", "T3FREE", "THYROIDAB" in the last 72 hours.  Anemia Panel: No results for input(s): "VITAMINB12", "FOLATE", "FERRITIN", "TIBC", "IRON", "RETICCTPCT" in the last 72 hours. Sepsis Labs: No results for input(s): "PROCALCITON", "LATICACIDVEN" in the last 168 hours.  Recent Results (from the past 240 hour(s))  Urine Culture (for pregnant, neutropenic or urologic patients or patients with an indwelling urinary catheter)     Status: Abnormal   Collection Time: 09/06/22 10:42 PM    Specimen: Urine, Clean Catch  Result Value Ref Range Status   Specimen Description   Final    URINE, CLEAN CATCH Performed at [facility], [address], [place], [place] [address]    Special Requests   Final    NONE Performed at [facility], [address], [place], [place] [address]    Culture   Final    Two isolates with different morphologies were identified as the same organism.The most resistant organism was reported. >=100,000 COLONIES/mL ESCHERICHIA COLI Confirmed Extended Spectrum Beta-Lactamase Producer (ESBL).  In bloodstream infections from ESBL organisms, carbapenems are preferred over piperacillin/tazobactam. They are shown to have a lower risk of mortality.    Report Status 09/11/2022 FINAL  Final   Organism ID, Bacteria ESCHERICHIA COLI (A)  Final      Susceptibility   Escherichia coli - MIC*    AMPICILLIN >=32 RESISTANT Resistant     CEFAZOLIN >=64 RESISTANT Resistant     CEFEPIME <=0.12 SENSITIVE Sensitive     CEFTRIAXONE 0.5 SENSITIVE Sensitive     CIPROFLOXACIN <=0.25 SENSITIVE Sensitive     GENTAMICIN <=1 SENSITIVE Sensitive  IMIPENEM <=0.25 SENSITIVE Sensitive     NITROFURANTOIN <=16 SENSITIVE Sensitive     TRIMETH/SULFA <=20 SENSITIVE Sensitive     AMPICILLIN/SULBACTAM >=32 RESISTANT Resistant     PIP/TAZO 8 SENSITIVE Sensitive     * >=100,000 COLONIES/mL ESCHERICHIA COLI         Radiology Studies: No results found.      Scheduled Meds:  amLODipine  5 mg Oral Daily   atorvastatin  40 mg Oral Daily   Gerhardt's butt cream   Topical QID   levothyroxine  125 mcg Oral Q0600   lisinopril  20 mg Oral Daily   pantoprazole  20 mg Oral Daily   polyethylene glycol  17 g Oral Daily   QUEtiapine  12.5 mg Oral QHS   Continuous Infusions:  meropenem (MERREM) IV 1 g (09/13/22 0636)     LOS: 7 days    Time spent: 39 min  Alwyn Ren, MD  09/13/2022, 2:35 PM

## 2022-09-13 NOTE — Progress Notes (Signed)
Occupational Therapy Treatment Patient Details Name: [name] MRN: [id number] DOB: 1932/06/06 Today's Date: 09/13/2022   History of present illness [name] is a 89 y.o. male with medical history significant of   prostate cancer, HTN, HLD, hypothyroid, GERD, allergic rhinitis who presents 09/05/22  with generalized weakness at home as well as progressive hematuria over the last 2 to 3 days.   OT comments  Patient was able to engage in functional mobility with reduced A compared to last therapy note. Patient was able to sequence brushing teeth with no A after set up in recliner. Patient was noted to be confused but pleasant during session. Patient's family unable to assist patient at current level of assistance at home. Patient's discharge plan remains appropriate at this time. OT will continue to follow acutely.        If plan is discharge home, recommend the following:  A little help with walking and/or transfers;A little help with bathing/dressing/bathroom;Assistance with cooking/housework;Direct supervision/assist for medications management;Assist for transportation;Help with stairs or ramp for entrance;Direct supervision/assist for financial management;Supervision due to cognitive status   Equipment Recommendations  None recommended by OT       Precautions / Restrictions Precautions Precautions: Fall Precaution Comments: incontinent bladder and bowel Restrictions Weight Bearing Restrictions: No       Mobility Bed Mobility Overal bed mobility: Needs Assistance Bed Mobility: Sit to Supine       Sit to supine: Max assist               Balance Overall balance assessment: Needs assistance Sitting-balance support: Feet supported, Bilateral upper extremity supported Sitting balance-Leahy Scale: Fair     Standing balance support: Bilateral upper extremity supported, During functional activity, Reliant on assistive device for balance Standing balance-Leahy  Scale: Poor         ADL either performed or assessed with clinical judgement   ADL Overall ADL's : Needs assistance/impaired     Grooming: Sitting;Set up;Oral care Grooming Details (indicate cue type and reason): with increased time.                 Toilet Transfer: Stand-pivot;Rolling walker (2 wheels);Minimal assistance;+2 for safety/equipment;+2 for physical assistance Toilet Transfer Details (indicate cue type and reason): able to participate in functional mobility into hallway with increased time and cues to keep head up and walker close Toileting- Clothing Manipulation and Hygiene: Total assistance;Sit to/from stand Toileting - Clothing Manipulation Details (indicate cue type and reason): noted to have had BM while in bed with no awareness.              Cognition Arousal: Alert Behavior During Therapy: Flat affect Overall Cognitive Status: History of cognitive impairments - at baseline                                 General Comments: plesant but confused.                   Pertinent Vitals/ Pain       Pain Assessment Pain Assessment: Faces Faces Pain Scale: Hurts even more Pain Location: bottom Pain Descriptors / Indicators: Discomfort, Grimacing Pain Intervention(s): Limited activity within patient's tolerance, Monitored during session, Repositioned         Frequency  Min 1X/week        Progress Toward Goals  OT Goals(current goals can now be found in the care plan section)  Progress towards OT  goals: Progressing toward goals     Plan      Co-evaluation    PT/OT/SLP Co-Evaluation/Treatment: Yes Reason for Co-Treatment: For patient/therapist safety;To address functional/ADL transfers PT goals addressed during session: Mobility/safety with mobility OT goals addressed during session: ADL's and self-care      AM-PAC OT "6 Clicks" Daily Activity     Outcome Measure   Help from another person eating meals?: A  Little Help from another person taking care of personal grooming?: A Little Help from another person toileting, which includes using toliet, bedpan, or urinal?: A Lot Help from another person bathing (including washing, rinsing, drying)?: A Lot Help from another person to put on and taking off regular upper body clothing?: A Little Help from another person to put on and taking off regular lower body clothing?: A Lot 6 Click Score: 15    End of Session Equipment Utilized During Treatment: Gait belt;Rolling walker (2 wheels)  OT Visit Diagnosis: Unsteadiness on feet (R26.81);Other abnormalities of gait and mobility (R26.89)   Activity Tolerance Patient tolerated treatment well   Patient Left in chair;with call bell/phone within reach;with chair alarm set   Nurse Communication Other (comment) (present during session)        Time: 4696-2952 OT Time Calculation (min): 24 min  Charges: OT General Charges $OT Visit: 1 Visit OT Treatments $Therapeutic Activity: 8-22 mins  Rosalio Loud, MS Acute Rehabilitation Department Office# [phone]   Selinda Flavin 09/13/2022, 10:41 AM

## 2022-09-13 NOTE — Progress Notes (Signed)
Physical Therapy Treatment Patient Details Name: [name] MRN: [id number] DOB: 24-Nov-1932 Today's Date: 09/13/2022   History of Present Illness [name] is a 89 y.o. male with medical history significant of   prostate cancer, HTN, HLD, hypothyroid, GERD, allergic rhinitis who presents 09/05/22  with generalized weakness at home as well as progressive hematuria over the last 2 to 3 days.    PT Comments   Pt admitted with above diagnosis.  Pt currently with functional limitations due to the deficits listed below (see PT Problem List). Pt in bed when therapist arrived. Pt agreeable to therapy intervention. Pt required increased time, cues and max A for supine to sit, mod A x 1 and min A x 1 for pull to stand at RW from elevated EOB, min A for gait tasks at [facility] with recliner close, pt exhibited excessive trunk flexion, slow cadence with shuffling pattern. Pt left seated in recliner all needs in place. Pt d/c plan remains appropriate Patient will benefit from continued inpatient follow up therapy, <3 hours/day. Pt will benefit from acute skilled PT to increase their independence and safety with mobility to allow discharge.      If plan is discharge home, recommend the following: A little help with walking and/or transfers;A little help with bathing/dressing/bathroom;Assistance with cooking/housework;Assist for transportation;Help with stairs or ramp for entrance   Can travel by private vehicle     No  Equipment Recommendations  None recommended by PT    Recommendations for Other Services       Precautions / Restrictions Precautions Precautions: Fall Precaution Comments: incontinent bladder and bowel Restrictions Weight Bearing Restrictions: No     Mobility  Bed Mobility Overal bed mobility: Needs Assistance Bed Mobility: Supine to Sit       Sit to supine: +2 for physical assistance, +2 for safety/equipment, HOB elevated, Used rails, Max assist (for B LEs)   General bed  mobility comments: pt required total A x 2 for repositioning in bed    Transfers Overall transfer level: Needs assistance Equipment used: Rolling walker (2 wheels) Transfers: Sit to/from Stand Sit to Stand: Min assist, +2 physical assistance, +2 safety/equipment, From elevated surface, Mod assist           General transfer comment: pt required increased time, cues for proper UE placement and encouragement with pull to stand from elevated surface mod A x 1 and min A x 1, pt unable to demonstrate trunk and hip extension with static standing for nurse to assist with pericare.    Ambulation/Gait Ambulation/Gait assistance: Min assist Gait Distance (Feet): 22 Feet Assistive device: Rolling walker (2 wheels) Gait Pattern/deviations: Trunk flexed, Step-to pattern, Shuffle Gait velocity: decr     General Gait Details: excesive trunk flexion, constant cues for proper distance from RW and encouragement with min A and recliner close   Stairs             Wheelchair Mobility     Tilt Bed    Modified Rankin (Stroke Patients Only)       Balance Overall balance assessment: Needs assistance Sitting-balance support: Feet supported, Bilateral upper extremity supported Sitting balance-Leahy Scale: Fair     Standing balance support: Bilateral upper extremity supported, During functional activity, Reliant on assistive device for balance Standing balance-Leahy Scale: Poor Standing balance comment: stabilizes on RW, excessive trunk flexion  Cognition Arousal: Alert Behavior During Therapy: WFL for tasks assessed/performed Overall Cognitive Status: History of cognitive impairments - at baseline Area of Impairment: Orientation, Safety/judgement                 Orientation Level: Place, Time, Situation, Disoriented to       Safety/Judgement: Decreased awareness of deficits     General Comments: pt requires increased time for motor  processing and planning, pt able to follow one step commands. pt able to initiate converstation and respond appropriately        Exercises      General Comments        Pertinent Vitals/Pain Pain Assessment Pain Assessment: No/denies pain (pt indicated pressure on his bottom when in bed)    Home Living                          Prior Function            PT Goals (current goals can now be found in the care plan section) Acute Rehab PT Goals Patient Stated Goal: agreeable to get OOB PT Goal Formulation: Patient unable to participate in goal setting Time For Goal Achievement: 09/20/22 Potential to Achieve Goals: Fair Progress towards PT goals: Progressing toward goals    Frequency    Min 1X/week      PT Plan      Co-evaluation PT/OT/SLP Co-Evaluation/Treatment: Yes Reason for Co-Treatment: For patient/therapist safety;To address functional/ADL transfers PT goals addressed during session: Mobility/safety with mobility OT goals addressed during session: ADL's and self-care      AM-PAC PT "6 Clicks" Mobility   Outcome Measure  Help needed turning from your back to your side while in a flat bed without using bedrails?: A Lot Help needed moving from lying on your back to sitting on the side of a flat bed without using bedrails?: A Lot Help needed moving to and from a bed to a chair (including a wheelchair)?: A Lot Help needed standing up from a chair using your arms (e.g., wheelchair or bedside chair)?: A Lot Help needed to walk in hospital room?: A Little Help needed climbing 3-5 steps with a railing? : Total 6 Click Score: 12    End of Session Equipment Utilized During Treatment: Gait belt Activity Tolerance: Patient limited by pain;Treatment limited secondary to medical complications (Comment) Patient left: with call bell/phone within reach;in chair;with chair alarm set Nurse Communication: Mobility status PT Visit Diagnosis: Unsteadiness on feet  (R26.81);History of falling (Z91.81);Muscle weakness (generalized) (M62.81);Difficulty in walking, not elsewhere classified (R26.2);Pain;Other abnormalities of gait and mobility (R26.89)     Time: 8295-6213 PT Time Calculation (min) (ACUTE ONLY): 25 min  Charges:    $Gait Training: 8-22 mins PT General Charges $$ ACUTE PT VISIT: 1 Visit                     Johnny Bridge, PT Acute Rehab    Jacqualyn Posey 09/13/2022, 10:42 AM

## 2022-09-14 DIAGNOSIS — I700 Atherosclerosis of aorta: Secondary | ICD-10-CM | POA: Diagnosis not present

## 2022-09-14 DIAGNOSIS — N179 Acute kidney failure, unspecified: Secondary | ICD-10-CM | POA: Diagnosis not present

## 2022-09-14 DIAGNOSIS — I4891 Unspecified atrial fibrillation: Secondary | ICD-10-CM | POA: Diagnosis not present

## 2022-09-14 DIAGNOSIS — C3490 Malignant neoplasm of unspecified part of unspecified bronchus or lung: Secondary | ICD-10-CM | POA: Diagnosis not present

## 2022-09-14 DIAGNOSIS — M6289 Other specified disorders of muscle: Secondary | ICD-10-CM | POA: Diagnosis not present

## 2022-09-14 DIAGNOSIS — Z885 Allergy status to narcotic agent status: Secondary | ICD-10-CM | POA: Diagnosis not present

## 2022-09-14 DIAGNOSIS — Z8673 Personal history of transient ischemic attack (TIA), and cerebral infarction without residual deficits: Secondary | ICD-10-CM | POA: Diagnosis not present

## 2022-09-14 DIAGNOSIS — Z9989 Dependence on other enabling machines and devices: Secondary | ICD-10-CM | POA: Diagnosis not present

## 2022-09-14 DIAGNOSIS — J189 Pneumonia, unspecified organism: Secondary | ICD-10-CM | POA: Diagnosis not present

## 2022-09-14 DIAGNOSIS — R918 Other nonspecific abnormal finding of lung field: Secondary | ICD-10-CM | POA: Diagnosis not present

## 2022-09-14 DIAGNOSIS — Z66 Do not resuscitate: Secondary | ICD-10-CM | POA: Diagnosis not present

## 2022-09-14 DIAGNOSIS — E669 Obesity, unspecified: Secondary | ICD-10-CM | POA: Diagnosis not present

## 2022-09-14 DIAGNOSIS — J1282 Pneumonia due to coronavirus disease 2019: Secondary | ICD-10-CM | POA: Diagnosis not present

## 2022-09-14 DIAGNOSIS — J9811 Atelectasis: Secondary | ICD-10-CM | POA: Diagnosis not present

## 2022-09-14 DIAGNOSIS — U071 COVID-19: Secondary | ICD-10-CM | POA: Diagnosis not present

## 2022-09-14 DIAGNOSIS — E785 Hyperlipidemia, unspecified: Secondary | ICD-10-CM | POA: Diagnosis not present

## 2022-09-14 DIAGNOSIS — I2699 Other pulmonary embolism without acute cor pulmonale: Secondary | ICD-10-CM | POA: Diagnosis not present

## 2022-09-14 DIAGNOSIS — R1311 Dysphagia, oral phase: Secondary | ICD-10-CM | POA: Diagnosis present

## 2022-09-14 DIAGNOSIS — J9601 Acute respiratory failure with hypoxia: Secondary | ICD-10-CM | POA: Diagnosis not present

## 2022-09-14 DIAGNOSIS — I10 Essential (primary) hypertension: Secondary | ICD-10-CM | POA: Diagnosis not present

## 2022-09-14 DIAGNOSIS — R41841 Cognitive communication deficit: Secondary | ICD-10-CM | POA: Diagnosis not present

## 2022-09-14 DIAGNOSIS — R0602 Shortness of breath: Secondary | ICD-10-CM | POA: Diagnosis not present

## 2022-09-14 DIAGNOSIS — Z7982 Long term (current) use of aspirin: Secondary | ICD-10-CM | POA: Diagnosis not present

## 2022-09-14 DIAGNOSIS — Z87891 Personal history of nicotine dependence: Secondary | ICD-10-CM | POA: Diagnosis not present

## 2022-09-14 DIAGNOSIS — E861 Hypovolemia: Secondary | ICD-10-CM | POA: Diagnosis not present

## 2022-09-14 DIAGNOSIS — I110 Hypertensive heart disease with heart failure: Secondary | ICD-10-CM | POA: Diagnosis not present

## 2022-09-14 DIAGNOSIS — K219 Gastro-esophageal reflux disease without esophagitis: Secondary | ICD-10-CM | POA: Diagnosis not present

## 2022-09-14 DIAGNOSIS — M1711 Unilateral primary osteoarthritis, right knee: Secondary | ICD-10-CM | POA: Diagnosis present

## 2022-09-14 DIAGNOSIS — R6521 Severe sepsis with septic shock: Secondary | ICD-10-CM | POA: Diagnosis not present

## 2022-09-14 DIAGNOSIS — K624 Stenosis of anus and rectum: Secondary | ICD-10-CM | POA: Diagnosis present

## 2022-09-14 DIAGNOSIS — R0689 Other abnormalities of breathing: Secondary | ICD-10-CM | POA: Diagnosis not present

## 2022-09-14 DIAGNOSIS — N390 Urinary tract infection, site not specified: Secondary | ICD-10-CM | POA: Diagnosis not present

## 2022-09-14 DIAGNOSIS — R0902 Hypoxemia: Secondary | ICD-10-CM | POA: Diagnosis not present

## 2022-09-14 DIAGNOSIS — R42 Dizziness and giddiness: Secondary | ICD-10-CM | POA: Diagnosis not present

## 2022-09-14 DIAGNOSIS — Z7952 Long term (current) use of systemic steroids: Secondary | ICD-10-CM | POA: Diagnosis not present

## 2022-09-14 DIAGNOSIS — E871 Hypo-osmolality and hyponatremia: Secondary | ICD-10-CM | POA: Diagnosis not present

## 2022-09-14 DIAGNOSIS — A419 Sepsis, unspecified organism: Secondary | ICD-10-CM | POA: Diagnosis not present

## 2022-09-14 DIAGNOSIS — E875 Hyperkalemia: Secondary | ICD-10-CM | POA: Diagnosis not present

## 2022-09-14 DIAGNOSIS — I5022 Chronic systolic (congestive) heart failure: Secondary | ICD-10-CM | POA: Diagnosis not present

## 2022-09-14 DIAGNOSIS — E039 Hypothyroidism, unspecified: Secondary | ICD-10-CM | POA: Diagnosis not present

## 2022-09-14 DIAGNOSIS — Z515 Encounter for palliative care: Secondary | ICD-10-CM | POA: Diagnosis not present

## 2022-09-14 DIAGNOSIS — E872 Acidosis, unspecified: Secondary | ICD-10-CM | POA: Diagnosis not present

## 2022-09-14 DIAGNOSIS — Z792 Long term (current) use of antibiotics: Secondary | ICD-10-CM | POA: Diagnosis not present

## 2022-09-14 DIAGNOSIS — Z1331 Encounter for screening for depression: Secondary | ICD-10-CM | POA: Diagnosis not present

## 2022-09-14 DIAGNOSIS — E440 Moderate protein-calorie malnutrition: Secondary | ICD-10-CM | POA: Diagnosis not present

## 2022-09-14 DIAGNOSIS — R7989 Other specified abnormal findings of blood chemistry: Secondary | ICD-10-CM | POA: Diagnosis not present

## 2022-09-14 DIAGNOSIS — R5381 Other malaise: Secondary | ICD-10-CM | POA: Diagnosis not present

## 2022-09-14 DIAGNOSIS — M6281 Muscle weakness (generalized): Secondary | ICD-10-CM | POA: Diagnosis not present

## 2022-09-14 DIAGNOSIS — R531 Weakness: Secondary | ICD-10-CM | POA: Diagnosis not present

## 2022-09-14 DIAGNOSIS — E441 Mild protein-calorie malnutrition: Secondary | ICD-10-CM | POA: Diagnosis not present

## 2022-09-14 DIAGNOSIS — R404 Transient alteration of awareness: Secondary | ICD-10-CM | POA: Diagnosis not present

## 2022-09-14 DIAGNOSIS — Z7401 Bed confinement status: Secondary | ICD-10-CM | POA: Diagnosis not present

## 2022-09-14 DIAGNOSIS — C61 Malignant neoplasm of prostate: Secondary | ICD-10-CM | POA: Diagnosis not present

## 2022-09-14 LAB — CBC WITH DIFFERENTIAL/PLATELET
Abs Immature Granulocytes: 0.06 10*3/uL (ref 0.00–0.07)
Basophils Absolute: 0 10*3/uL (ref 0.0–0.1)
Basophils Relative: 0 %
Eosinophils Absolute: 0.4 10*3/uL (ref 0.0–0.5)
Eosinophils Relative: 5 %
HCT: 37.7 % — ABNORMAL LOW (ref 39.0–52.0)
Hemoglobin: 12.3 g/dL — ABNORMAL LOW (ref 13.0–17.0)
Immature Granulocytes: 1 %
Lymphocytes Relative: 13 %
Lymphs Abs: 1 10*3/uL (ref 0.7–4.0)
MCH: 30.2 pg (ref 26.0–34.0)
MCHC: 32.6 g/dL (ref 30.0–36.0)
MCV: 92.6 fL (ref 80.0–100.0)
Monocytes Absolute: 0.6 10*3/uL (ref 0.1–1.0)
Monocytes Relative: 8 %
Neutro Abs: 5.4 10*3/uL (ref 1.7–7.7)
Neutrophils Relative %: 73 %
Platelets: 312 10*3/uL (ref 150–400)
RBC: 4.07 MIL/uL — ABNORMAL LOW (ref 4.22–5.81)
RDW: 14.7 % (ref 11.5–15.5)
WBC: 7.4 10*3/uL (ref 4.0–10.5)
nRBC: 0 % (ref 0.0–0.2)

## 2022-09-14 MED ORDER — POLYETHYLENE GLYCOL 3350 17 G PO PACK: 17.0000 g | PACK | Freq: Every day | ORAL | 0 refills | Status: DC

## 2022-09-14 MED ORDER — CARMEX CLASSIC LIP BALM EX OINT: TOPICAL_OINTMENT | CUTANEOUS | Status: DC | PRN

## 2022-09-14 MED ORDER — ONDANSETRON HCL 4 MG PO TABS: 4.0000 mg | ORAL_TABLET | Freq: Four times a day (QID) | ORAL | 0 refills | Status: DC | PRN

## 2022-09-14 MED ORDER — LISINOPRIL 20 MG PO TABS: 20.0000 mg | ORAL_TABLET | Freq: Every day | ORAL | Status: DC

## 2022-09-14 MED ORDER — GERHARDT'S BUTT CREAM: 1.0000 | TOPICAL_CREAM | Freq: Four times a day (QID) | CUTANEOUS | Status: DC

## 2022-09-14 MED ORDER — ACETAMINOPHEN 325 MG PO TABS: 650.0000 mg | ORAL_TABLET | Freq: Four times a day (QID) | ORAL | Status: DC | PRN

## 2022-09-14 MED ORDER — SODIUM BICARBONATE 650 MG PO TABS: 650.0000 mg | ORAL_TABLET | Freq: Three times a day (TID) | ORAL | Status: DC

## 2022-09-14 MED ORDER — QUETIAPINE FUMARATE 25 MG PO TABS: 12.5000 mg | ORAL_TABLET | Freq: Every day | ORAL | Status: DC

## 2022-09-14 MED ORDER — AMLODIPINE BESYLATE 5 MG PO TABS: 5.0000 mg | ORAL_TABLET | Freq: Every day | ORAL | Status: DC

## 2022-09-14 MED ORDER — SULFAMETHOXAZOLE-TRIMETHOPRIM 800-160 MG PO TABS: 1.0000 | ORAL_TABLET | Freq: Two times a day (BID) | ORAL | 0 refills | Status: DC

## 2022-09-14 NOTE — TOC Transition Note (Signed)
Transition of Care [facility]) - CM/SW Discharge Note   Patient Details  Name: [name] MRN: [id number] Date of Birth: 07/23/1932  [facility] CM/SW Contact:  Lanier Clam, RN Phone Number: 09/14/2022, 1:57 PM   Clinical Narrative: Updated d/c summary sent via hub w/corrections mae-Shanna adm coordinator @ [facility]-accepted d/c summary. Patient going to [phone], Nsg has already called report. PTAR called. No further CM needs.      Final next level of care: Skilled Nursing Facility Barriers to Discharge: No Barriers Identified   Patient Goals and CMS Choice CMS Medicare.gov Compare Post Acute Care list provided to:: Other (Comment Required) (daughter Sunny Schlein) Choice offered to / list presented to : Spouse, Adult Children  Discharge Placement                Patient chooses bed at: Other - please specify in the comment section below: [facility] rehab) Patient to be transferred to facility by: PTAR Name of family member notified: Myriam Jacobson, wife Patient and family notified of of transfer: 09/14/22  Discharge Plan and Services Additional resources added to the After Visit Summary for   In-house Referral: NA Discharge Planning Services: CM Consult Post Acute Care Choice: Nursing Home          DME Arranged: N/A DME Agency: NA       HH Arranged: NA HH Agency: NA        Social Determinants of Health (SDOH) Interventions SDOH Screenings   Food Insecurity: No Food Insecurity (09/06/2022)  Housing: Low Risk  (09/06/2022)  Transportation Needs: No Transportation Needs (09/06/2022)  Utilities: Not At Risk (09/06/2022)  Alcohol Screen: Low Risk  (02/19/2022)  Depression (PHQ2-9): Low Risk  (06/23/2022)  Recent Concern: Depression (PHQ2-9) - Medium Risk (04/15/2022)  Financial Resource Strain: Low Risk  (02/19/2022)  Physical Activity: Inactive (02/19/2022)  Social Connections: Moderately Integrated (02/19/2022)  Stress: No Stress Concern Present (02/19/2022)  Tobacco  Use: Medium Risk (09/09/2022)     Readmission Risk Interventions    09/09/2022    3:11 PM  Readmission Risk Prevention Plan  Transportation Screening Complete  PCP or Specialist Appt within 5-7 Days Complete  Home Care Screening Complete  Medication Review (RN CM) Complete

## 2022-09-14 NOTE — Progress Notes (Signed)
PTAR here to transport patient to [facility] rehab. Patients wife Malikk Simrell  notified of patients discharge.

## 2022-09-14 NOTE — Progress Notes (Signed)
Report called to Alinda Dooms, LPN @ [facility] facility. Patient awaiting PTAR transport to facility.

## 2022-09-14 NOTE — Discharge Summary (Addendum)
Physician Discharge Summary  [name] ZOX:[id number] DOB: 1932-06-05 DOA: 09/05/2022  PCP: Mechele Claude, MD  Admit date: 09/05/2022 Discharge date: 09/14/2022  Admitted From: HOME Disposition:SNF Recommendations for Outpatient Follow-up:  Follow up with PCP in 1-2 weeks Please obtain BMP/CBC in one week Please follow up with dr Leone Payor  Follow up with [facility] Follow up with dr Truett Perna   Home Health:none Equipment/Devices:none Discharge Condition:stable CODE STATUS:dnr Diet recommendation: cardiac Brief/Interim Summary:  89 y.o. male with medical history significant for prostate cancer, HTN, HLD, hypothyroidism, vertigo, hemorrhagic cystitis, urinary incontinence, GERD who presents with generalized weakness at home as well as progressive hematuria over the last 2 to 3 days.  Patient reports being treated for UTI approximately 2 weeks ago completing treatment about 2 days ago. In the ED, pt was noted to require 2-3 people to help get him up and around and it was felt that outpatient evaluation of his ongoing hematuria would be unable to be completed. Urology was consulted by the EDP.  Patient admitted for further management.   Discharge Diagnoses:  Principal Problem:   Generalized weakness Active Problems:   Essential hypertension   Hyperlipidemia   Hypothyroidism   Multiple lacunar infarcts (HCC)   Malignant neoplasm of unspecified part of unspecified bronchus or lung (HCC)   Prostate cancer (HCC)   Hematuria  Hematuria-resolved.  He was on aspirin prior to admission.  This was on hold during the hospital stay.  Please keep an eye on this and stop the aspirin if he gets hematuria again.  Urine culture this admission with ESBL  E Coli-he was treated with Rocephin initially and changed to meropenem once we got the cultures back.  He will finish his course of treatment with Bactrim on discharge for 4 more days. Renal/bladder ultrasound unremarkable Urology consulted,  recommend repeat urine culture, outpatient follow-up   Generalized weakness improved seen by PT recommending rehab.   History of prostate cancer S/p prostatectomy and radiation in 1997 On Lupron every 6 months Follows with Urology   Malignant neoplasm of unspecified part of unspecified bronchus or lung Status post XRT   Multiple lacunar infarcts-on aspirin   Hypothyroidism Continue Synthroid   Hyperlipidemia Continue Lipitor   Essential hypertension-continue amlodipine and lisinopril   ?  Rectal wall thickening with microperforation-he was already on Rocephin and Flagyl was added to it.  Then patient received meropenem.  And being discharged on Bactrim for treatment of UTI.  Discussion with patient's wife they were really not very keen on putting him through another colonoscopy.  However this was something they are still talking to family members about.  He will need to follow-up with Dr. Leone Payor if family decides to have colonoscopy done for the patient.    Estimated body mass index is 29.3 kg/m as calculated from the following:   Height as of this encounter: 6' (1.829 m).   Weight as of this encounter: 98 kg.  Discharge Instructions  Discharge Instructions     Diet - low sodium heart healthy   Complete by: As directed    Increase activity slowly   Complete by: As directed       Allergies as of 09/14/2022       Reactions   Caffeine Palpitations   Codeine Palpitations        Medication List     STOP taking these medications    cephALEXin 500 MG capsule Commonly known as: KEFLEX   Leuprolide Acetate (6 Month) 45 MG injection Commonly  known as: LUPRON   Xtandi 40 MG tablet Generic drug: enzalutamide       TAKE these medications    acetaminophen 325 MG tablet Commonly known as: TYLENOL Take 2 tablets (650 mg total) by mouth every 6 (six) hours as needed for mild pain (or Fever >/= 101).   amLODipine 5 MG tablet Commonly known as: NORVASC Take 1  tablet (5 mg total) by mouth daily. What changed:  medication strength how much to take   aspirin 325 MG tablet Take 1 tablet (325 mg total) by mouth daily.   atorvastatin 40 MG tablet Commonly known as: LIPITOR Take 1 tablet by mouth once daily   azelastine 0.1 % nasal spray Commonly known as: ASTELIN Place 2 sprays into both nostrils 2 (two) times daily. Use in each nostril as directed   CENTRUM SILVER ULTRA MENS PO Take 1 tablet by mouth daily.   Gerhardt's butt cream Crea Apply 1 Application topically 4 (four) times daily.   levothyroxine 125 MCG tablet Commonly known as: SYNTHROID Take 1 tablet (125 mcg total) by mouth daily before breakfast.   lip balm ointment Apply topically as needed.   lisinopril 20 MG tablet Commonly known as: ZESTRIL Take 1 tablet (20 mg total) by mouth daily. What changed:  medication strength how much to take   loratadine 10 MG tablet Commonly known as: CLARITIN Take 10 mg by mouth daily.   Magnesium 300 MG Caps Take 1 capsule by mouth daily.   ondansetron 4 MG tablet Commonly known as: ZOFRAN Take 1 tablet (4 mg total) by mouth every 6 (six) hours as needed for nausea.   pantoprazole 20 MG tablet Commonly known as: PROTONIX Take 1 tablet (20 mg total) by mouth daily. For stomach   polyethylene glycol 17 g packet Commonly known as: MIRALAX / GLYCOLAX Take 17 g by mouth daily.   PROBIOTIC DAILY PO Take 1 capsule by mouth daily.   QUEtiapine 25 MG tablet Commonly known as: SEROQUEL Take 0.5 tablets (12.5 mg total) by mouth at bedtime.   sodium bicarbonate 650 MG tablet Take 1 tablet (650 mg total) by mouth 3 (three) times daily.   sodium chloride 0.65 % Soln nasal spray Commonly known as: OCEAN Place 1 spray into both nostrils as needed for congestion.   sulfamethoxazole-trimethoprim 800-160 MG tablet Commonly known as: BACTRIM DS Take 1 tablet by mouth 2 (two) times daily.   Vitamin D3 125 MCG (5000 UT) Caps Take  1 capsule by mouth daily.        Follow-up Information     Iva Boop, MD Follow up on 10/26/2022.   Specialty: Gastroenterology Why: at 9:10 am. Please arrive 10 minutes early Contact information: [address] [place] [address] [phone]         Mechele Claude, MD Follow up.   Specialty: Family Medicine Contact information: [address] [place] [address] [phone]         Ladene Artist, MD Follow up.   Specialty: Oncology Contact information: [address] [place] [address] [phone]         [facility] Follow up.   Contact information: [address] [place] [address] [place] [address] [phone]               Allergies  Allergen Reactions   Caffeine Palpitations   Codeine Palpitations    Consultations-urology   Procedures/Studies: CT ABDOMEN PELVIS W WO CONTRAST  Result Date: 09/08/2022 CLINICAL  DATA:  Hematuria. EXAM: CT ABDOMEN AND PELVIS WITHOUT AND WITH CONTRAST TECHNIQUE: Multidetector CT imaging of the abdomen and pelvis was performed following the standard protocol before and following the bolus administration of intravenous contrast. RADIATION DOSE REDUCTION: This exam was performed according to the departmental dose-optimization program which includes automated exposure control, adjustment of the mA and/or kV according to patient size and/or use of iterative reconstruction technique. CONTRAST:  OMNIPAQUE IOHEXOL 300 MG/ML  SOLN COMPARISON:  None Available. FINDINGS: Evaluation of this exam is limited due to respiratory motion artifact. Lower chest: Bibasilar subpleural atelectasis/scarring. There is coronary vascular calcification. No intra-abdominal free air or free fluid. Hepatobiliary: The liver is unremarkable. No biliary dilatation. The gallbladder is unremarkable. Pancreas: Unremarkable. No pancreatic ductal dilatation or surrounding inflammatory changes. Spleen:  Normal in size without focal abnormality. Adrenals/Urinary Tract: The adrenal glands are unremarkable. There is no hydronephrosis or nephrolithiasis on either side. Indeterminate 2 cm hypodense lesion from the anterior lower pole of the left kidney which is not characterized on this CT due to motion artifact but present on the prior CT of 2023 most consistent with a cyst. There is symmetric enhancement and excretion of contrast by both kidneys. The visualized ureters appear unremarkable. The urinary bladder is minimally distended. Mild thickened appearance of the bladder wall may be related to underdistention or represent chronic bladder outlet obstruction. Stomach/Bowel: There is severe sigmoid diverticulosis without active inflammatory changes. There is irregular and lobulated thickening of the right rectal wall measuring approximately 2.4 cm in thickness (78/4) concerning for malignancy. Further evaluation with colonoscopy recommended. Several small pockets of air along the right lateral rectal wall may represent air within ulceration or focally contained perforation. There is no bowel obstruction. The appendix is normal. Vascular/Lymphatic: Advanced aortoiliac atherosclerotic disease. The IVC is unremarkable. No portal venous gas. Several rounded subcentimeter lymph nodes in the posterior pelvis along the medial rectum measure up to 7 mm in short axis. No adenopathy. Reproductive: Prostatectomy. The left testicle is located in the inguinal canal. Other: Midline vertical anterior pelvic wall incisional scar. Musculoskeletal: Osteopenia with severe degenerative changes. Age indeterminate L2 and L4 compression fractures, new since the lumbar CT of 01/12/2021. Correlation with clinical exam and point tenderness recommended. IMPRESSION: 1. No hydronephrosis or nephrolithiasis. 2. Irregular and lobulated thickening of the right rectal wall concerning for malignancy. Further evaluation with colonoscopy recommended. 3.  Small pockets of air extending from the rectal lumen along the right rectal wall with apparent extraluminal extension may represent focally contained microperforation. No fluid collection or abscess. 4. Several small rounded perirectal lymph nodes. 5. Severe sigmoid diverticulosis. No bowel obstruction. Normal appendix. 6. Age indeterminate L2 and L4 compression fractures, new since the lumbar CT of 01/12/2021. Correlation with clinical exam and point tenderness recommended. 7.  Aortic Atherosclerosis (ICD10-I70.0). Electronically Signed   By: Elgie Collard M.D.   On: 09/08/2022 22:18   DG Knee 1-2 Views Left  Result Date: 09/08/2022 CLINICAL DATA:  Pain EXAM: LEFT KNEE - 1-2 VIEW COMPARISON:  None Available. FINDINGS: No fracture, dislocation, or definite effusion. Motion degradation of the lateral projection. Narrowing of the articular cartilage in the medial compartment. Medial and lateral chondrocalcinosis. Marginal spurring from the patellar articular surface. IMPRESSION: 1. No acute findings. 2. Degenerative changes as above. Electronically Signed   By: Corlis Leak M.D.   On: 09/08/2022 16:48   US RENAL  Result Date: 09/06/2022 CLINICAL DATA:  Hematuria EXAM: RENAL / URINARY TRACT ULTRASOUND COMPLETE COMPARISON:  01/11/2021  abdominal CT FINDINGS: Right Kidney: Renal measurements: 9 x 5 x 5 cm = volume: 116 mL. No hydronephrosis or evidence of mass. Left Kidney: Renal measurements: 9 x 6 x 5 cm = volume: 130 mL. Echogenicity within normal limits. No mass or hydronephrosis visualized. Bladder: Not visualized.  History of catheter with presumed decompression. IMPRESSION: 1. Symmetric and unremarkable kidneys. 2. Nonvisualized bladder. Electronically Signed   By: Tiburcio Pea M.D.   On: 09/06/2022 14:53   DG Knee Complete 4 Views Right  Result Date: 08/22/2022 CLINICAL DATA:  pain EXAM: RIGHT KNEE - COMPLETE 4+ VIEW COMPARISON:  None Available. FINDINGS: Osteopenia. No acute fracture or  dislocation. Tricompartmental degenerative changes with moderate to severe joint space narrowing of the lateral compartment, moderate joint space narrowing of the medial compartment with osteophyte formation and moderate to severe joint space narrowing and osteophyte formation of the patellofemoral compartment. No area of erosion or osseous destruction. No unexpected radiopaque foreign body. Chondrocalcinosis. No significant joint effusion. Vascular calcifications. IMPRESSION: Tricompartmental degenerative changes. Electronically Signed   By: Meda Klinefelter M.D.   On: 08/22/2022 13:11   DG Pelvis Portable  Result Date: 08/22/2022 CLINICAL DATA:  pain weakness EXAM: PORTABLE PELVIS 1-2 VIEWS COMPARISON:  August 19, 2021, September 12, 2021 FINDINGS: Osteopenia. Revisualization of a compression fracture deformity of L5. Extent of vertebral body height loss is grossly similar in comparison to prior. Sacrum is completely obscured by overlapping bowel contents. No definitive acute fracture or dislocation on single AP view. Multiple pelvic clips. IMPRESSION: No definitive acute fracture or dislocation on single AP view. Grossly similar appearance of an L5 compression fracture deformity. If persistent clinical concern, recommend dedicated cross-sectional imaging. Electronically Signed   By: Meda Klinefelter M.D.   On: 08/22/2022 13:09   CT Head Wo Contrast  Result Date: 08/22/2022 CLINICAL DATA:  89 year old male TIA.  New/increasing weakness. EXAM: CT HEAD WITHOUT CONTRAST TECHNIQUE: Contiguous axial images were obtained from the base of the skull through the vertex without intravenous contrast. RADIATION DOSE REDUCTION: This exam was performed according to the departmental dose-optimization program which includes automated exposure control, adjustment of the mA and/or kV according to patient size and/or use of iterative reconstruction technique. COMPARISON:  Brain MRI 06/30/2020.  Head CT 08/03/2022. FINDINGS:  Brain: Chronic small vessel disease most pronounced in the right corona radiata and bilateral basal ganglia. Small chronic right cerebellar infarct. Stable gray-white matter differentiation throughout the brain. No midline shift, ventriculomegaly, mass effect, evidence of mass lesion, intracranial hemorrhage or evidence of cortically based acute infarction. Vascular: Calcified atherosclerosis at the skull base. No suspicious intracranial vascular hyperdensity. Skull: No acute osseous abnormality identified. Sinuses/Orbits: Stable paranasal sinuses. Chronic posterior right ethmoid opacification but other Visualized paranasal sinuses and mastoids are stable and well aerated. Other: No acute orbit or scalp soft tissue finding. IMPRESSION: No acute intracranial abnormality. Stable non contrast CT appearance of chronic small vessel ischemia. Electronically Signed   By: Odessa Fleming M.D.   On: 08/22/2022 09:37   DG Chest Port 1 View  Result Date: 08/22/2022 CLINICAL DATA:  Weakness EXAM: PORTABLE CHEST 1 VIEW COMPARISON:  05/03/2022 FINDINGS: The heart size and mediastinal contours are within normal limits. Aortic atherosclerosis. Post treatment changes within the left suprahilar region. Streaky opacity within the right mid lung. No pleural effusion or pneumothorax. The visualized skeletal structures are unremarkable. IMPRESSION: Streaky opacity within the right mid lung, atelectasis versus infiltrate. Electronically Signed   By: Duanne Guess D.O.   On: 08/22/2022  09:20   (Echo, Carotid, EGD, Colonoscopy, ERCP)    Subjective:   Discharge Exam: Vitals:   09/14/22 1058 09/14/22 1309  BP: (!) 161/81 (!) 149/72  Pulse: 83 99  Resp:  20  Temp:  98.2 F (36.8 C)  SpO2: 95% 94%   Vitals:   09/13/22 1950 09/14/22 0553 09/14/22 1058 09/14/22 1309  BP: (!) 146/89 (!) 169/95 (!) 161/81 (!) 149/72  Pulse: 77 79 83 99  Resp: 18 18  20   Temp: 97.6 F (36.4 C) 98.1 F (36.7 C)  98.2 F (36.8 C)  TempSrc:  Oral Oral  Oral  SpO2: 96% 96% 95% 94%  Weight:      Height:        General: Pt is alert, awake, not in acute distress Cardiovascular: RRR, S1/S2 +, no rubs, no gallops Respiratory: CTA bilaterally, no wheezing, no rhonchi Abdominal: Soft, NT, ND, bowel sounds + Extremities: no edema, no cyanosis    The results of significant diagnostics from this hospitalization (including imaging, microbiology, ancillary and laboratory) are listed below for reference.     Microbiology: Recent Results (from the past 240 hour(s))  Urine Culture (for pregnant, neutropenic or urologic patients or patients with an indwelling urinary catheter)     Status: Abnormal   Collection Time: 09/06/22 10:42 PM   Specimen: Urine, Clean Catch  Result Value Ref Range Status   Specimen Description   Final    URINE, CLEAN CATCH Performed at [facility], [address] [place], [place] [address]    Special Requests   Final    NONE Performed at [facility], [address] [place], [place] [address]    Culture   Final    Two isolates with different morphologies were identified as the same organism.The most resistant organism was reported. >=100,000 COLONIES/mL ESCHERICHIA COLI Confirmed Extended Spectrum Beta-Lactamase Producer (ESBL).  In bloodstream infections from ESBL organisms, carbapenems are preferred over piperacillin/tazobactam. They are shown to have a lower risk of mortality.    Report Status 09/11/2022 FINAL  Final   Organism ID, Bacteria ESCHERICHIA COLI (A)  Final      Susceptibility   Escherichia coli - MIC*    AMPICILLIN >=32 RESISTANT Resistant     CEFAZOLIN >=64 RESISTANT Resistant     CEFEPIME <=0.12 SENSITIVE Sensitive     CEFTRIAXONE 0.5 SENSITIVE Sensitive     CIPROFLOXACIN <=0.25 SENSITIVE Sensitive     GENTAMICIN <=1 SENSITIVE Sensitive     IMIPENEM <=0.25 SENSITIVE Sensitive     NITROFURANTOIN <=16 SENSITIVE Sensitive     TRIMETH/SULFA <=20  SENSITIVE Sensitive     AMPICILLIN/SULBACTAM >=32 RESISTANT Resistant     PIP/TAZO 8 SENSITIVE Sensitive     * >=100,000 COLONIES/mL ESCHERICHIA COLI     Labs: BNP (last 3 results) No results for input(s): "BNP" in the last 8760 hours. Basic Metabolic Panel: Recent Labs  Lab 09/08/22 0925 09/09/22 0807 09/10/22 0942 09/11/22 1014 09/13/22 0848  NA 128* 124* 127* 125* 126*  K 4.0 4.1 4.0 3.9 4.2  CL 95* 95* 99 98 96*  CO2 22 21* 20* 16* 23  GLUCOSE 112* 93 121* 121* 92  BUN 12 12 9 10 10   CREATININE 0.68 0.67 0.66 0.61 0.59*  CALCIUM 9.2 8.6* 8.4* 8.4* 8.9   Liver Function Tests: Recent Labs  Lab 09/09/22 0807 09/11/22 1014  AST 19 17  ALT 12 12  ALKPHOS 86 73  BILITOT 1.0 0.4  PROT 6.3* 5.7*  ALBUMIN 2.9* 2.6*   No results for input(s): "LIPASE", "AMYLASE" in the last 168 hours. No results for input(s): "AMMONIA" in the last 168 hours. CBC: Recent Labs  Lab 09/10/22 0942 09/11/22 0949 09/12/22 0853 09/13/22 0848 09/14/22 0820  WBC 6.9 7.0 6.2 5.7 7.4  NEUTROABS 5.5 5.4 4.2 3.8 5.4  HGB 12.0* 10.9* 11.5* 12.4* 12.3*  HCT 37.3* 34.3* 36.2* 38.3* 37.7*  MCV 94.9 94.0 94.8 92.1 92.6  PLT 267 256 265 269 312   Cardiac Enzymes: No results for input(s): "CKTOTAL", "CKMB", "CKMBINDEX", "TROPONINI" in the last 168 hours. BNP: Invalid input(s): "POCBNP" CBG: No results for input(s): "GLUCAP" in the last 168 hours. D-Dimer No results for input(s): "DDIMER" in the last 72 hours. Hgb A1c No results for input(s): "HGBA1C" in the last 72 hours. Lipid Profile No results for input(s): "CHOL", "HDL", "LDLCALC", "TRIG", "CHOLHDL", "LDLDIRECT" in the last 72 hours. Thyroid function studies No results for input(s): "TSH", "T4TOTAL", "T3FREE", "THYROIDAB" in the last 72 hours.  Invalid input(s): "FREET3" Anemia work up No results for input(s): "VITAMINB12", "FOLATE", "FERRITIN", "TIBC", "IRON", "RETICCTPCT" in the last 72 hours. Urinalysis    Component Value  Date/Time   COLORURINE YELLOW 09/06/2022 2242   APPEARANCEUR HAZY (A) 09/06/2022 2242   LABSPEC 1.004 (L) 09/06/2022 2242   PHURINE 7.0 09/06/2022 2242   GLUCOSEU NEGATIVE 09/06/2022 2242   HGBUR LARGE (A) 09/06/2022 2242   BILIRUBINUR NEGATIVE 09/06/2022 2242   KETONESUR NEGATIVE 09/06/2022 2242   PROTEINUR NEGATIVE 09/06/2022 2242   UROBILINOGEN 1.0 05/16/2013 1917   NITRITE NEGATIVE 09/06/2022 2242   LEUKOCYTESUR LARGE (A) 09/06/2022 2242   Sepsis Labs Recent Labs  Lab 09/11/22 0949 09/12/22 0853 09/13/22 0848 09/14/22 0820  WBC 7.0 6.2 5.7 7.4   Microbiology Recent Results (from the past 240 hour(s))  Urine Culture (for pregnant, neutropenic or urologic patients or patients with an indwelling urinary catheter)     Status: Abnormal   Collection Time: 09/06/22 10:42 PM   Specimen: Urine, Clean Catch  Result Value Ref Range Status   Specimen Description   Final    URINE, CLEAN CATCH Performed at [facility], [address], [place], [place] [address]    Special Requests   Final    NONE Performed at [facility], [address] [place], [place] [address]    Culture   Final    Two isolates with different morphologies were identified as the same organism.The most resistant organism was reported. >=100,000 COLONIES/mL ESCHERICHIA COLI Confirmed Extended Spectrum Beta-Lactamase Producer (ESBL).  In bloodstream infections from ESBL organisms, carbapenems are preferred over piperacillin/tazobactam. They are shown to have a lower risk of mortality.    Report Status 09/11/2022 FINAL  Final   Organism ID, Bacteria ESCHERICHIA COLI (A)  Final      Susceptibility   Escherichia coli - MIC*    AMPICILLIN >=32 RESISTANT Resistant     CEFAZOLIN >=64 RESISTANT Resistant     CEFEPIME <=0.12 SENSITIVE Sensitive     CEFTRIAXONE 0.5 SENSITIVE Sensitive     CIPROFLOXACIN <=0.25 SENSITIVE Sensitive     GENTAMICIN <=1 SENSITIVE Sensitive      IMIPENEM <=0.25 SENSITIVE Sensitive     NITROFURANTOIN <=16 SENSITIVE Sensitive     TRIMETH/SULFA <=20 SENSITIVE Sensitive     AMPICILLIN/SULBACTAM >=32 RESISTANT Resistant     PIP/TAZO 8 SENSITIVE Sensitive     * >=100,000 COLONIES/mL ESCHERICHIA COLI     Time coordinating discharge:  39 minutes  SIGNED:  Alwyn Ren, MD  [facility] 09/14/2022, 1:22 PM

## 2022-09-14 NOTE — TOC Transition Note (Addendum)
Transition of Care [facility]) - CM/SW Discharge Note   Patient Details  Name: [name] MRN: [id number] Date of Birth: 03-Aug-1932  Transition of Care [facility]) CM/SW Contact:  Coralyn Helling, LCSW Phone Number: 09/14/2022, 10:02 AM   Clinical Narrative:   Patient to dc to [facility] via [facility]. Wife reports that patient is no longer taking Xtandi. Facility ask that we make dure it is not on the dc summary. Dc summary faxed to facility. Packet on chart.    RN # for report: [phone]  Please call family when [facility] arrives.     Final next level of care: Skilled Nursing Facility Barriers to Discharge: No Barriers Identified   Patient Goals and CMS Choice CMS Medicare.gov Compare Post Acute Care list provided to:: Other (Comment Required) (daughter Sunny Schlein) Choice offered to / list presented to : Spouse, Adult Children  Discharge Placement                Patient chooses bed at: Other - please specify in the comment section below: [facility] rehab) Patient to be transferred to facility by: [facility] Name of family member notified: Myriam Jacobson, wife Patient and family notified of of transfer: 09/14/22  Discharge Plan and Services Additional resources added to the After Visit Summary for   In-house Referral: NA Discharge Planning Services: CM Consult Post Acute Care Choice: Nursing Home          DME Arranged: N/A DME Agency: NA       HH Arranged: NA HH Agency: NA        Social Determinants of Health (SDOH) Interventions SDOH Screenings   Food Insecurity: No Food Insecurity (09/06/2022)  Housing: Low Risk  (09/06/2022)  Transportation Needs: No Transportation Needs (09/06/2022)  Utilities: Not At Risk (09/06/2022)  Alcohol Screen: Low Risk  (02/19/2022)  Depression (PHQ2-9): Low Risk  (06/23/2022)  Recent Concern: Depression (PHQ2-9) - Medium Risk (04/15/2022)  Financial Resource Strain: Low Risk  (02/19/2022)  Physical Activity: Inactive (02/19/2022)  Social Connections: Moderately  Integrated (02/19/2022)  Stress: No Stress Concern Present (02/19/2022)  Tobacco Use: Medium Risk (09/09/2022)     Readmission Risk Interventions    09/09/2022    3:11 PM  Readmission Risk Prevention Plan  Transportation Screening Complete  PCP or Specialist Appt within 5-7 Days Complete  Home Care Screening Complete  Medication Review (RN CM) Complete

## 2022-09-14 NOTE — Evaluation (Signed)
Clinical/Bedside Swallow Evaluation Patient Details  Name: [name] MRN: [id number] Date of Birth: [birth year]  Today's Date: 09/14/2022 Time: SLP Start Time (ACUTE ONLY): 1105 SLP Stop Time (ACUTE ONLY): 1130 SLP Time Calculation (min) (ACUTE ONLY): 25 min  Past Medical History:  Past Medical History:  Diagnosis Date   Aphasia    Arthritis    BNC (bladder neck contracture)    BPPV (benign paroxysmal positional vertigo)    Cataract    bilateral   Diverticulosis    H/O diarrhea SECONDARY TO RADIATION THERAPY --  INTERMITANT  DIARRHEA   Hematuria    History of hemorrhagic cystitis    SECONDARY RADIATION THERAPY 1997   History of prostate cancer CURRENTLY  ELEVATED PSA--  LUPRON INJECTIONS   S/P PROSTATECTOMY AND RADIATION THERAPY  1997   Hyperlipidemia    Hypertension    Hypothyroidism    Radiation cystitis    Radiation proctitis    Urinary incontinence    Urinary retention    Vitamin D deficiency    Past Surgical History:  Past Surgical History:  Procedure Laterality Date   CATARACT EXTRACTION W/ INTRAOCULAR LENS IMPLANT Right    COLONOSCOPY W/ POLYPECTOMY  2005; 08/11/2010   2005: 1 cm hyperplastic sigmoid polyp, Dr. Laural Benes 2012: hyperplastic splenic flexure polyp -1 cm, diverticulosis, radiation proctitis, anal stenosis   CYSTO/ FULGERATION OF BLEEDERS/ RESECTION BLADDER NECK CONTRACTURE  07-06-2004   BNC AND RADIATION CYSTITIS   EYE SURGERY     KNEE ARTHROSCOPY Right    LUMBAR DISC SURGERY  1996   L4 -- L5   PROSTATECTOMY  1997   SPINE SURGERY     TRANSURETHRAL RESECTION OF BLADDER NECK N/A 06/23/2012   Procedure: TRANSURETHRAL RESECTION OF BLADDER NECK CONTRACTURE WITH GYRUS;  Surgeon: Garnett Farm, MD;  Location: [facility] [place];  Service: Urology;  Laterality: N/A;   TRANSURETHRAL RESECTION OF BLADDER TUMOR WITH GYRUS (TURBT-GYRUS) N/A 06/01/2013   Procedure: TRANSURETHRAL RESECTION OF BLADDER NECK CONTRACTURE WITH GYRUS (TURBT-GYRUS) AND INJECTION  OF KENALOG;  Surgeon: Garnett Farm, MD;  Location: [facility];  Service: Urology;  Laterality: N/A;   HPI:  89 y.o. male with medical history significant for prostate cancer, HTN, HLD, hypothyroidism, vertigo, hemorrhagic cystitis, urinary incontinence, GERD who presents with generalized weakness at home as well as progressive hematuria over the last 2 to 3 days.  Patient reports being treated for UTI approximately 2 weeks ago completing treatment about 2 days ago. In the ED, pt was noted to require 2-3 people to help get him up and around and it was felt that outpatient evaluation of his ongoing hematuria would be unable to be completed. Urology was consulted by the EDP.    Assessment / Plan / Recommendation  Clinical Impression  Patient presents with overall functional swallow - swallow overall was timely - without clinical indication of aspiration with water alone, Ensure, applesauce and graham cracker.  He did present with overt coughing when using water to aid oral transiting of pill taken with puree.  Eructation noted frequently during po - after liquids - and pt demonstrated delayed cough after "snack" causing SLP to suspect component of age related esophageal dysphagia - and/or reflux. Pt is on a PPI at this time. Recommend continue diet with general precautions including avoiding washing down foods, taking meds with puree or Ensure and consuming small frequent meals. SLP Visit Diagnosis: Dysphagia, oral phase (R13.11);Dysphagia, unspecified (R13.10)    Aspiration Risk  Mild aspiration risk  Diet Recommendation Regular;Thin liquid    Liquid Administration via: Cup;Straw Medication Administration: Whole meds with puree (or with Ensure) Supervision: Intermittent supervision to cue for compensatory strategies;Patient able to self feed Compensations: Minimize environmental distractions;Slow rate;Small sips/bites Postural Changes: Seated upright at 90 degrees;Remain upright  for at least 30 minutes after po intake    Other  Recommendations Oral Care Recommendations: Oral care BID Caregiver Recommendations: Have oral suction available    Recommendations for follow up therapy are one component of a multi-disciplinary discharge planning process, led by the attending physician.  Recommendations may be updated based on patient status, additional functional criteria and insurance authorization.  Follow up Recommendations No SLP follow up      Assistance Recommended at Discharge    Functional Status Assessment Patient has not had a recent decline in their functional status  Frequency and Duration            Prognosis        Swallow Study   General Date of Onset: 09/14/22 HPI: 89 y.o. male with medical history significant for prostate cancer, HTN, HLD, hypothyroidism, vertigo, hemorrhagic cystitis, urinary incontinence, GERD who presents with generalized weakness at home as well as progressive hematuria over the last 2 to 3 days.  Patient reports being treated for UTI approximately 2 weeks ago completing treatment about 2 days ago. In the ED, pt was noted to require 2-3 people to help get him up and around and it was felt that outpatient evaluation of his ongoing hematuria would be unable to be completed. Urology was consulted by the EDP. Type of Study: Bedside Swallow Evaluation Diet Prior to this Study: Regular;Thin liquids (Level 0) Temperature Spikes Noted: No Respiratory Status: Room air History of Recent Intubation: No Behavior/Cognition: Alert;Cooperative;Pleasant mood Oral Cavity Assessment: Within Functional Limits Oral Care Completed by SLP: Yes Oral Cavity - Dentition: Adequate natural dentition Vision: Functional for self-feeding Self-Feeding Abilities: Able to feed self;Needs assist Patient Positioning: Upright in bed Baseline Vocal Quality: Normal Volitional Cough: Strong Volitional Swallow: Unable to elicit    Oral/Motor/Sensory Function  Overall Oral Motor/Sensory Function: Mild impairment Facial ROM: Reduced left Facial Symmetry: Abnormal symmetry left Facial Strength: Within Functional Limits Facial Sensation: Reduced left Lingual ROM: Within Functional Limits Lingual Symmetry: Within Functional Limits Lingual Strength: Within Functional Limits Lingual Sensation: Within Functional Limits Velum: Within Functional Limits Mandible: Within Functional Limits   Ice Chips Ice chips: Not tested   Thin Liquid Thin Liquid: Impaired Presentation: Cup;Straw Pharyngeal  Phase Impairments: Cough - Immediate    Nectar Thick Nectar Thick Liquid: Within functional limits   Honey Thick Honey Thick Liquid: Not tested   Puree Puree: Within functional limits Presentation: Self Fed;Spoon   Solid     Solid: Within functional limits Presentation: Self Fed;Spoon Other Comments: oral retention - upper labial left, cleared I      Chales Abrahams 09/14/2022,11:50 AM  Rolena Infante, MS [facility] SLP Acute Rehab Services Office [phone]

## 2022-09-15 DIAGNOSIS — I10 Essential (primary) hypertension: Secondary | ICD-10-CM | POA: Diagnosis not present

## 2022-09-15 DIAGNOSIS — R42 Dizziness and giddiness: Secondary | ICD-10-CM | POA: Diagnosis not present

## 2022-09-15 DIAGNOSIS — C61 Malignant neoplasm of prostate: Secondary | ICD-10-CM | POA: Diagnosis not present

## 2022-09-15 DIAGNOSIS — R918 Other nonspecific abnormal finding of lung field: Secondary | ICD-10-CM | POA: Diagnosis not present

## 2022-09-15 DIAGNOSIS — Z8673 Personal history of transient ischemic attack (TIA), and cerebral infarction without residual deficits: Secondary | ICD-10-CM | POA: Diagnosis not present

## 2022-09-15 DIAGNOSIS — E785 Hyperlipidemia, unspecified: Secondary | ICD-10-CM | POA: Diagnosis not present

## 2022-09-15 DIAGNOSIS — M6281 Muscle weakness (generalized): Secondary | ICD-10-CM | POA: Diagnosis not present

## 2022-09-15 DIAGNOSIS — E441 Mild protein-calorie malnutrition: Secondary | ICD-10-CM | POA: Diagnosis not present

## 2022-09-15 DIAGNOSIS — N390 Urinary tract infection, site not specified: Secondary | ICD-10-CM | POA: Diagnosis not present

## 2022-09-15 DIAGNOSIS — C3490 Malignant neoplasm of unspecified part of unspecified bronchus or lung: Secondary | ICD-10-CM | POA: Diagnosis not present

## 2022-09-15 DIAGNOSIS — I700 Atherosclerosis of aorta: Secondary | ICD-10-CM | POA: Diagnosis not present

## 2022-09-15 DIAGNOSIS — E039 Hypothyroidism, unspecified: Secondary | ICD-10-CM | POA: Diagnosis not present

## 2022-09-17 ENCOUNTER — Other Ambulatory Visit: Payer: Medicare Other

## 2022-09-23 ENCOUNTER — Ambulatory Visit: Payer: Medicare Other | Admitting: Family Medicine

## 2022-09-24 DIAGNOSIS — C3490 Malignant neoplasm of unspecified part of unspecified bronchus or lung: Secondary | ICD-10-CM | POA: Diagnosis not present

## 2022-09-24 DIAGNOSIS — U071 COVID-19: Secondary | ICD-10-CM | POA: Diagnosis not present

## 2022-09-24 DIAGNOSIS — R918 Other nonspecific abnormal finding of lung field: Secondary | ICD-10-CM | POA: Diagnosis not present

## 2022-09-24 DIAGNOSIS — I700 Atherosclerosis of aorta: Secondary | ICD-10-CM | POA: Diagnosis not present

## 2022-09-24 DIAGNOSIS — Z8673 Personal history of transient ischemic attack (TIA), and cerebral infarction without residual deficits: Secondary | ICD-10-CM | POA: Diagnosis not present

## 2022-09-27 DIAGNOSIS — U071 COVID-19: Secondary | ICD-10-CM | POA: Diagnosis not present

## 2022-09-27 DIAGNOSIS — N390 Urinary tract infection, site not specified: Secondary | ICD-10-CM | POA: Diagnosis not present

## 2022-09-27 DIAGNOSIS — R5381 Other malaise: Secondary | ICD-10-CM | POA: Diagnosis not present

## 2022-09-29 ENCOUNTER — Ambulatory Visit: Payer: Medicare Other | Admitting: Family Medicine

## 2022-09-29 DIAGNOSIS — R6521 Severe sepsis with septic shock: Secondary | ICD-10-CM | POA: Diagnosis not present

## 2022-09-29 DIAGNOSIS — I509 Heart failure, unspecified: Secondary | ICD-10-CM | POA: Diagnosis not present

## 2022-09-29 DIAGNOSIS — R0902 Hypoxemia: Secondary | ICD-10-CM | POA: Diagnosis not present

## 2022-09-29 DIAGNOSIS — Z87891 Personal history of nicotine dependence: Secondary | ICD-10-CM | POA: Diagnosis not present

## 2022-09-29 DIAGNOSIS — R000 Tachycardia, unspecified: Secondary | ICD-10-CM | POA: Diagnosis not present

## 2022-09-29 DIAGNOSIS — R0602 Shortness of breath: Secondary | ICD-10-CM | POA: Diagnosis not present

## 2022-09-29 DIAGNOSIS — Z515 Encounter for palliative care: Secondary | ICD-10-CM | POA: Diagnosis not present

## 2022-09-29 DIAGNOSIS — Z9989 Dependence on other enabling machines and devices: Secondary | ICD-10-CM | POA: Diagnosis not present

## 2022-09-29 DIAGNOSIS — J189 Pneumonia, unspecified organism: Secondary | ICD-10-CM | POA: Diagnosis not present

## 2022-09-29 DIAGNOSIS — N179 Acute kidney failure, unspecified: Secondary | ICD-10-CM | POA: Diagnosis not present

## 2022-09-29 DIAGNOSIS — E861 Hypovolemia: Secondary | ICD-10-CM | POA: Diagnosis not present

## 2022-09-29 DIAGNOSIS — E875 Hyperkalemia: Secondary | ICD-10-CM | POA: Diagnosis not present

## 2022-09-29 DIAGNOSIS — Z8673 Personal history of transient ischemic attack (TIA), and cerebral infarction without residual deficits: Secondary | ICD-10-CM | POA: Diagnosis not present

## 2022-09-29 DIAGNOSIS — I4891 Unspecified atrial fibrillation: Secondary | ICD-10-CM | POA: Diagnosis not present

## 2022-09-29 DIAGNOSIS — E872 Acidosis, unspecified: Secondary | ICD-10-CM | POA: Diagnosis not present

## 2022-09-29 DIAGNOSIS — R7989 Other specified abnormal findings of blood chemistry: Secondary | ICD-10-CM | POA: Diagnosis not present

## 2022-09-29 DIAGNOSIS — I110 Hypertensive heart disease with heart failure: Secondary | ICD-10-CM | POA: Diagnosis not present

## 2022-09-29 DIAGNOSIS — Z885 Allergy status to narcotic agent status: Secondary | ICD-10-CM | POA: Diagnosis not present

## 2022-09-29 DIAGNOSIS — E871 Hypo-osmolality and hyponatremia: Secondary | ICD-10-CM | POA: Diagnosis not present

## 2022-09-29 DIAGNOSIS — Z792 Long term (current) use of antibiotics: Secondary | ICD-10-CM | POA: Diagnosis not present

## 2022-09-29 DIAGNOSIS — U071 COVID-19: Secondary | ICD-10-CM | POA: Diagnosis not present

## 2022-09-29 DIAGNOSIS — Z66 Do not resuscitate: Secondary | ICD-10-CM | POA: Diagnosis not present

## 2022-09-29 DIAGNOSIS — Z7952 Long term (current) use of systemic steroids: Secondary | ICD-10-CM | POA: Diagnosis not present

## 2022-09-29 DIAGNOSIS — Z79899 Other long term (current) drug therapy: Secondary | ICD-10-CM | POA: Diagnosis not present

## 2022-09-29 DIAGNOSIS — J9601 Acute respiratory failure with hypoxia: Secondary | ICD-10-CM | POA: Diagnosis not present

## 2022-09-29 DIAGNOSIS — Z8546 Personal history of malignant neoplasm of prostate: Secondary | ICD-10-CM | POA: Diagnosis not present

## 2022-09-29 DIAGNOSIS — I2699 Other pulmonary embolism without acute cor pulmonale: Secondary | ICD-10-CM | POA: Diagnosis not present

## 2022-09-29 DIAGNOSIS — R0689 Other abnormalities of breathing: Secondary | ICD-10-CM | POA: Diagnosis not present

## 2022-09-29 DIAGNOSIS — Z7982 Long term (current) use of aspirin: Secondary | ICD-10-CM | POA: Diagnosis not present

## 2022-09-29 DIAGNOSIS — A419 Sepsis, unspecified organism: Secondary | ICD-10-CM | POA: Diagnosis not present

## 2022-09-29 DIAGNOSIS — E669 Obesity, unspecified: Secondary | ICD-10-CM | POA: Diagnosis not present

## 2022-09-29 DIAGNOSIS — R7402 Elevation of levels of lactic acid dehydrogenase (LDH): Secondary | ICD-10-CM | POA: Diagnosis not present

## 2022-09-29 DIAGNOSIS — I5020 Unspecified systolic (congestive) heart failure: Secondary | ICD-10-CM | POA: Diagnosis not present

## 2022-09-29 DIAGNOSIS — R918 Other nonspecific abnormal finding of lung field: Secondary | ICD-10-CM | POA: Diagnosis not present

## 2022-09-29 DIAGNOSIS — J9811 Atelectasis: Secondary | ICD-10-CM | POA: Diagnosis not present

## 2022-09-29 DIAGNOSIS — R404 Transient alteration of awareness: Secondary | ICD-10-CM | POA: Diagnosis not present

## 2022-09-29 DIAGNOSIS — I10 Essential (primary) hypertension: Secondary | ICD-10-CM | POA: Diagnosis not present

## 2022-09-29 DIAGNOSIS — R9389 Abnormal findings on diagnostic imaging of other specified body structures: Secondary | ICD-10-CM | POA: Diagnosis not present

## 2022-09-29 DIAGNOSIS — E785 Hyperlipidemia, unspecified: Secondary | ICD-10-CM | POA: Diagnosis not present

## 2022-09-29 DIAGNOSIS — I5022 Chronic systolic (congestive) heart failure: Secondary | ICD-10-CM | POA: Diagnosis not present

## 2022-09-29 DIAGNOSIS — J1282 Pneumonia due to coronavirus disease 2019: Secondary | ICD-10-CM | POA: Diagnosis not present

## 2022-09-29 DIAGNOSIS — E440 Moderate protein-calorie malnutrition: Secondary | ICD-10-CM | POA: Diagnosis not present

## 2022-09-30 DIAGNOSIS — R7989 Other specified abnormal findings of blood chemistry: Secondary | ICD-10-CM | POA: Diagnosis not present

## 2022-09-30 DIAGNOSIS — Z792 Long term (current) use of antibiotics: Secondary | ICD-10-CM | POA: Diagnosis not present

## 2022-09-30 DIAGNOSIS — U071 COVID-19: Secondary | ICD-10-CM | POA: Diagnosis not present

## 2022-09-30 DIAGNOSIS — I5020 Unspecified systolic (congestive) heart failure: Secondary | ICD-10-CM | POA: Diagnosis not present

## 2022-09-30 DIAGNOSIS — R6521 Severe sepsis with septic shock: Secondary | ICD-10-CM | POA: Diagnosis not present

## 2022-09-30 DIAGNOSIS — E872 Acidosis, unspecified: Secondary | ICD-10-CM | POA: Diagnosis not present

## 2022-09-30 DIAGNOSIS — E871 Hypo-osmolality and hyponatremia: Secondary | ICD-10-CM | POA: Diagnosis not present

## 2022-09-30 DIAGNOSIS — E875 Hyperkalemia: Secondary | ICD-10-CM | POA: Diagnosis not present

## 2022-09-30 DIAGNOSIS — J9601 Acute respiratory failure with hypoxia: Secondary | ICD-10-CM | POA: Diagnosis not present

## 2022-09-30 DIAGNOSIS — A419 Sepsis, unspecified organism: Secondary | ICD-10-CM | POA: Diagnosis not present

## 2022-09-30 DIAGNOSIS — J1282 Pneumonia due to coronavirus disease 2019: Secondary | ICD-10-CM | POA: Diagnosis not present

## 2022-09-30 DIAGNOSIS — N179 Acute kidney failure, unspecified: Secondary | ICD-10-CM | POA: Diagnosis not present

## 2022-10-01 DIAGNOSIS — I509 Heart failure, unspecified: Secondary | ICD-10-CM | POA: Diagnosis not present

## 2022-10-01 DIAGNOSIS — J9811 Atelectasis: Secondary | ICD-10-CM | POA: Diagnosis not present

## 2022-10-01 DIAGNOSIS — Z79899 Other long term (current) drug therapy: Secondary | ICD-10-CM | POA: Diagnosis not present

## 2022-10-01 DIAGNOSIS — I5020 Unspecified systolic (congestive) heart failure: Secondary | ICD-10-CM | POA: Diagnosis not present

## 2022-10-01 DIAGNOSIS — R9389 Abnormal findings on diagnostic imaging of other specified body structures: Secondary | ICD-10-CM | POA: Diagnosis not present

## 2022-10-01 DIAGNOSIS — U071 COVID-19: Secondary | ICD-10-CM | POA: Diagnosis not present

## 2022-10-01 DIAGNOSIS — R6521 Severe sepsis with septic shock: Secondary | ICD-10-CM | POA: Diagnosis not present

## 2022-10-01 DIAGNOSIS — E872 Acidosis, unspecified: Secondary | ICD-10-CM | POA: Diagnosis not present

## 2022-10-01 DIAGNOSIS — R000 Tachycardia, unspecified: Secondary | ICD-10-CM | POA: Diagnosis not present

## 2022-10-01 DIAGNOSIS — A419 Sepsis, unspecified organism: Secondary | ICD-10-CM | POA: Diagnosis not present

## 2022-10-01 DIAGNOSIS — E871 Hypo-osmolality and hyponatremia: Secondary | ICD-10-CM | POA: Diagnosis not present

## 2022-10-01 DIAGNOSIS — E875 Hyperkalemia: Secondary | ICD-10-CM | POA: Diagnosis not present

## 2022-10-01 DIAGNOSIS — J1282 Pneumonia due to coronavirus disease 2019: Secondary | ICD-10-CM | POA: Diagnosis not present

## 2022-10-01 DIAGNOSIS — R0902 Hypoxemia: Secondary | ICD-10-CM | POA: Diagnosis not present

## 2022-10-01 DIAGNOSIS — Z792 Long term (current) use of antibiotics: Secondary | ICD-10-CM | POA: Diagnosis not present

## 2022-10-01 DIAGNOSIS — J9601 Acute respiratory failure with hypoxia: Secondary | ICD-10-CM | POA: Diagnosis not present

## 2022-10-01 DIAGNOSIS — N179 Acute kidney failure, unspecified: Secondary | ICD-10-CM | POA: Diagnosis not present

## 2022-10-01 DIAGNOSIS — R918 Other nonspecific abnormal finding of lung field: Secondary | ICD-10-CM | POA: Diagnosis not present

## 2022-10-02 DIAGNOSIS — E872 Acidosis, unspecified: Secondary | ICD-10-CM | POA: Diagnosis not present

## 2022-10-02 DIAGNOSIS — Z8546 Personal history of malignant neoplasm of prostate: Secondary | ICD-10-CM | POA: Diagnosis not present

## 2022-10-02 DIAGNOSIS — A419 Sepsis, unspecified organism: Secondary | ICD-10-CM | POA: Diagnosis not present

## 2022-10-02 DIAGNOSIS — U071 COVID-19: Secondary | ICD-10-CM | POA: Diagnosis not present

## 2022-10-02 DIAGNOSIS — R7989 Other specified abnormal findings of blood chemistry: Secondary | ICD-10-CM | POA: Diagnosis not present

## 2022-10-02 DIAGNOSIS — E785 Hyperlipidemia, unspecified: Secondary | ICD-10-CM | POA: Diagnosis not present

## 2022-10-02 DIAGNOSIS — J9601 Acute respiratory failure with hypoxia: Secondary | ICD-10-CM | POA: Diagnosis not present

## 2022-10-02 DIAGNOSIS — R6521 Severe sepsis with septic shock: Secondary | ICD-10-CM | POA: Diagnosis not present

## 2022-10-02 DIAGNOSIS — E871 Hypo-osmolality and hyponatremia: Secondary | ICD-10-CM | POA: Diagnosis not present

## 2022-10-02 DIAGNOSIS — I10 Essential (primary) hypertension: Secondary | ICD-10-CM | POA: Diagnosis not present

## 2022-10-02 DIAGNOSIS — R7402 Elevation of levels of lactic acid dehydrogenase (LDH): Secondary | ICD-10-CM | POA: Diagnosis not present

## 2022-10-02 DIAGNOSIS — E669 Obesity, unspecified: Secondary | ICD-10-CM | POA: Diagnosis not present

## 2022-10-26 ENCOUNTER — Ambulatory Visit: Payer: Medicare Other | Admitting: Internal Medicine

## 2022-10-26 DEATH — deceased

## 2022-11-01 ENCOUNTER — Ambulatory Visit: Payer: Medicare Other | Admitting: Neurology

## 2022-11-17 ENCOUNTER — Ambulatory Visit: Payer: Medicare Other | Admitting: Oncology

## 2023-02-24 IMAGING — CT CT MAXILLOFACIAL W/O CM
3 series · 15 of 47 positions shown, 18 images · non-contrast
Comparison: Brain MRI 06/30/2020, CT head 04/03/2016

CLINICAL DATA: Dizziness, headaches, sinus pressure

EXAM:
CT MAXILLOFACIAL WITHOUT CONTRAST
TECHNIQUE: Multidetector CT images of the paranasal sinuses were obtained using
the standard protocol without intravenous contrast.

[Series 2: 1 max soft · axial · 0.35mm/px · z∈[-408,-250]mm · 9 of 93 slices shown, 12 images]
[im 7/93  brain]
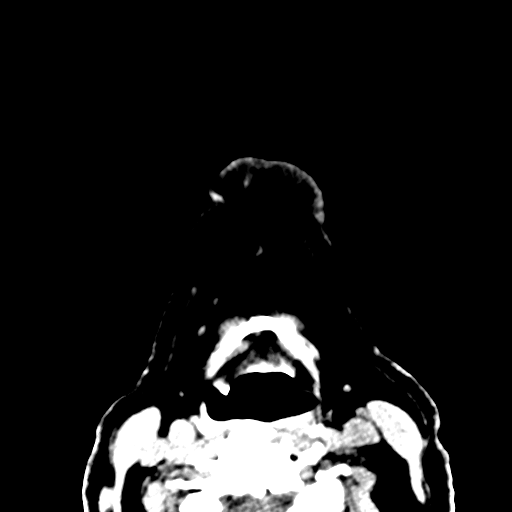
[im 7/93  bone]
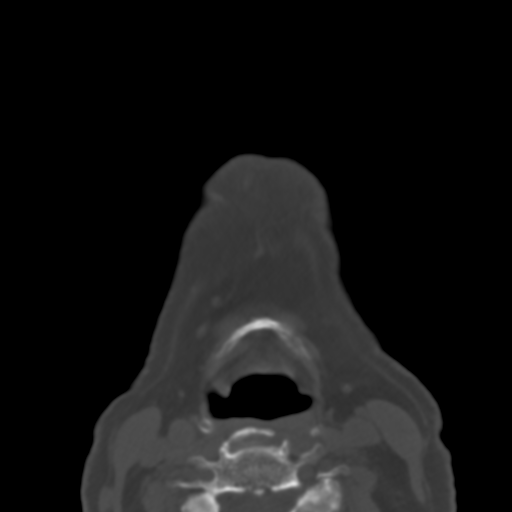
[im 16/93  bone]
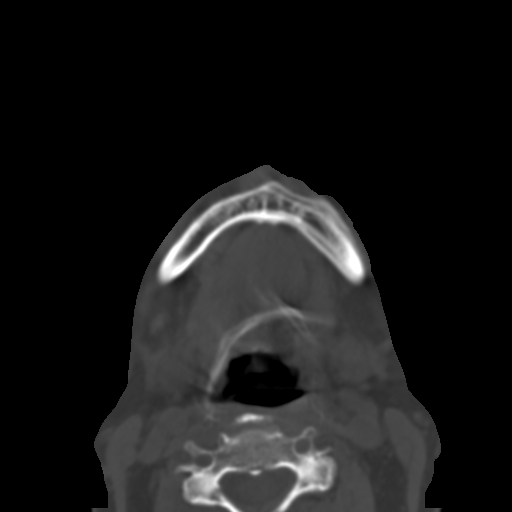
[im 26/93  bone]
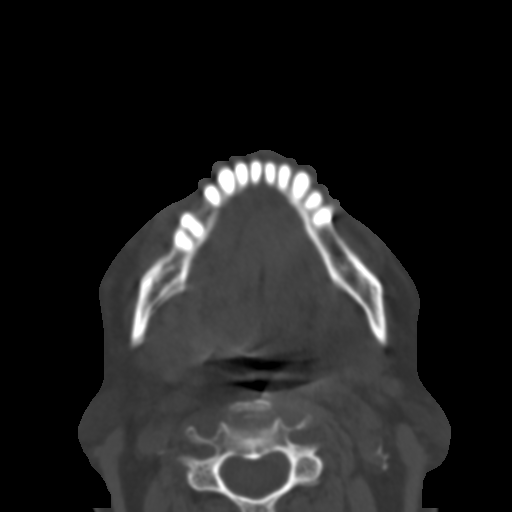
[im 35/93  bone]
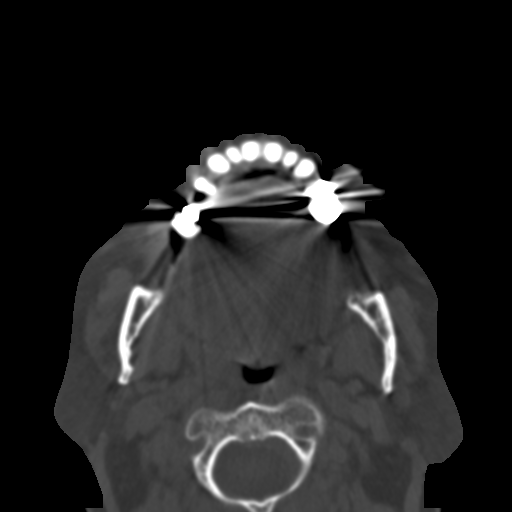
[im 48/93  brain]
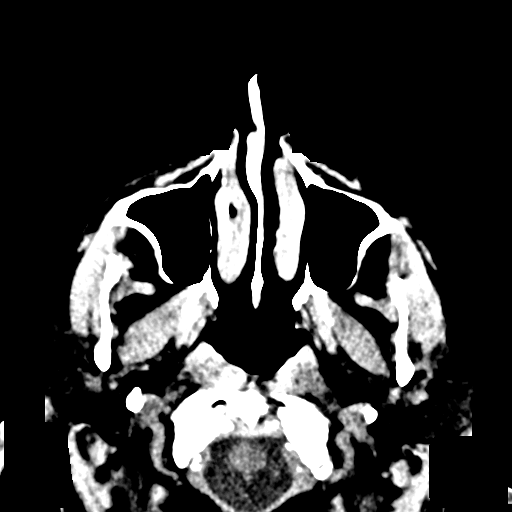
[im 48/93  bone]
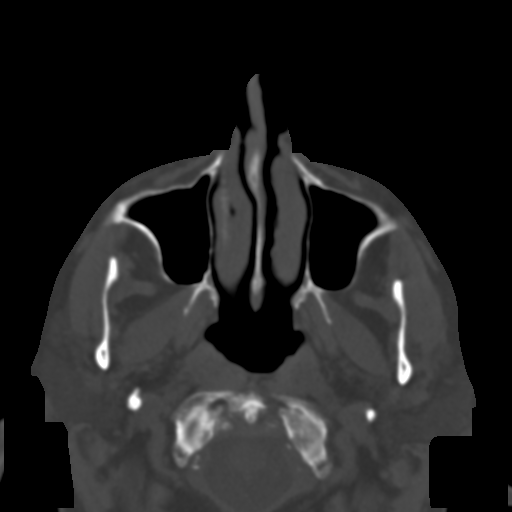
[im 58/93  bone]
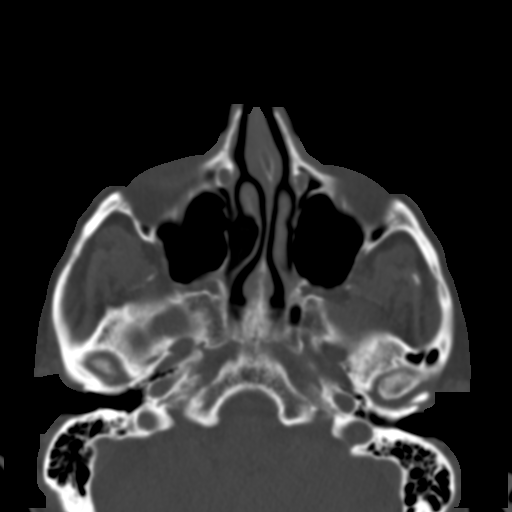
[im 67/93  bone]
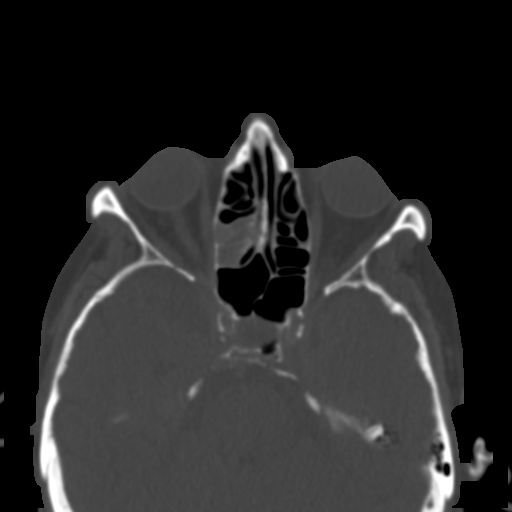
[im 77/93  bone]
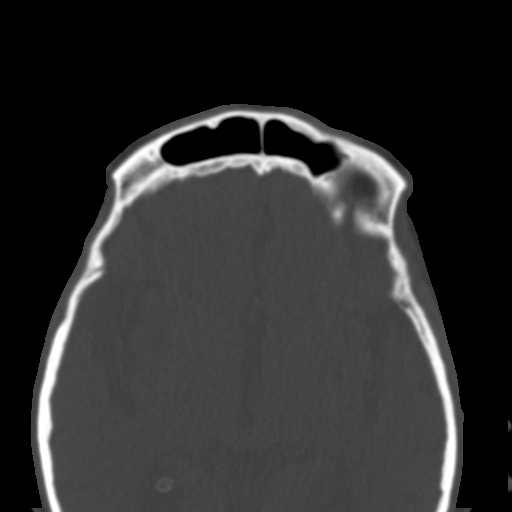
[im 86/93  brain]
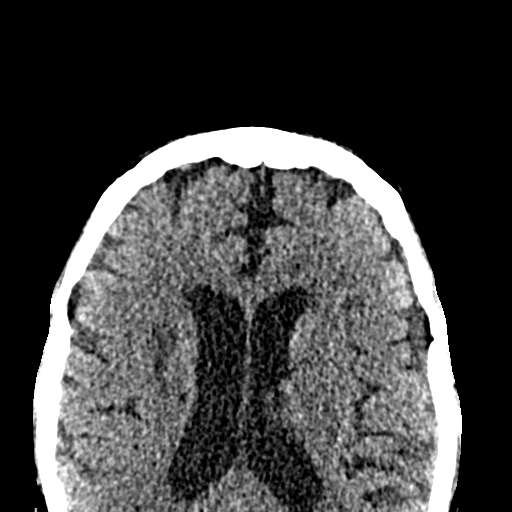
[im 86/93  bone]
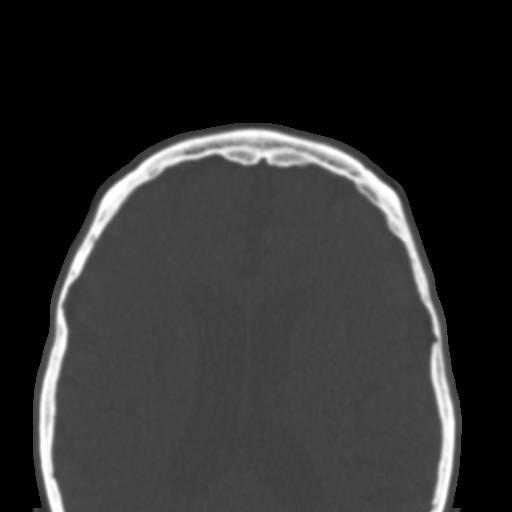

[Series 6: coronal soft · coronal · 0.37mm/px · 3 of 80 slices shown]
[im 27/80  bone]
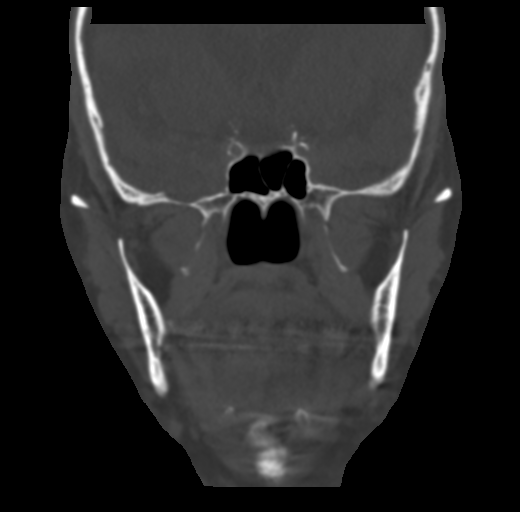
[im 36/80  bone]
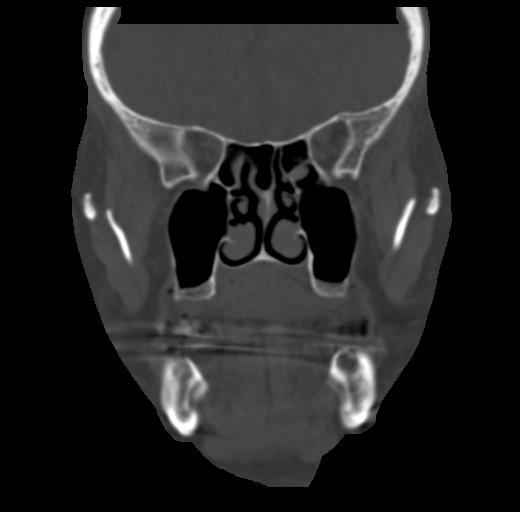
[im 44/80  bone]
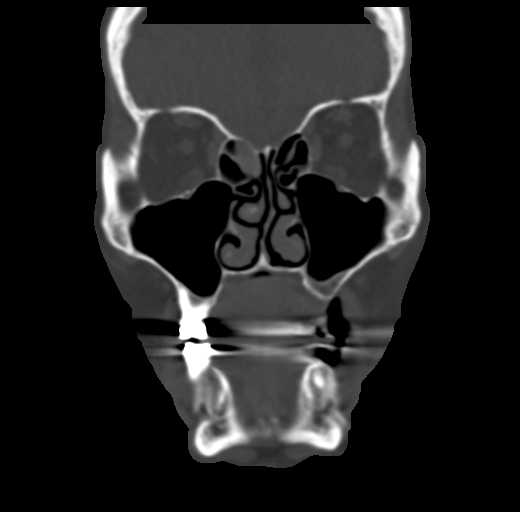

[Series 7: sagittal soft · sagittal · 0.32mm/px · 3 of 98 slices shown]
[im 33/98  bone]
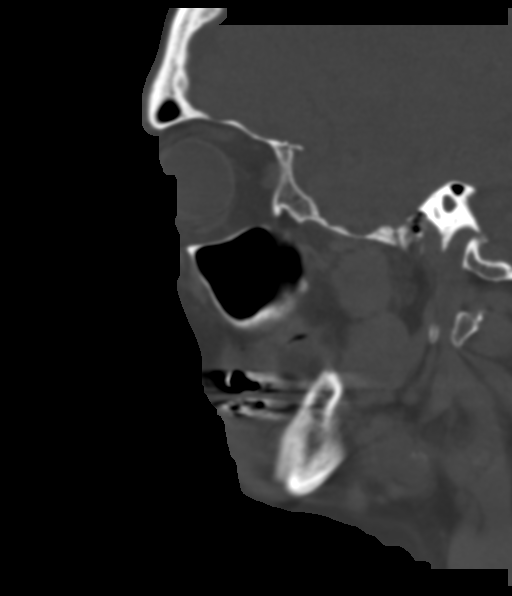
[im 49/98  bone]
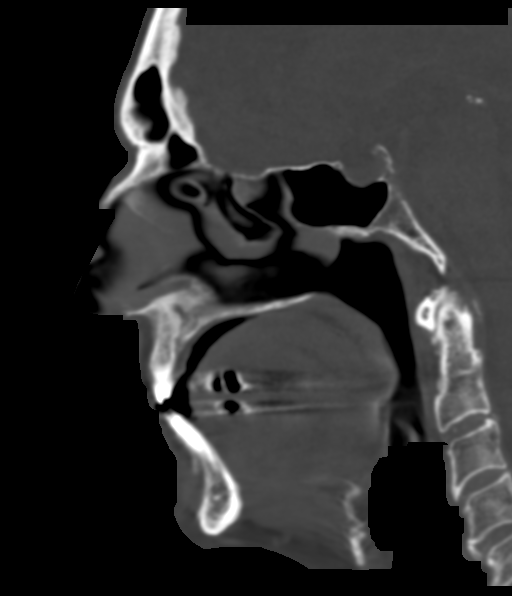
[im 65/98  bone]
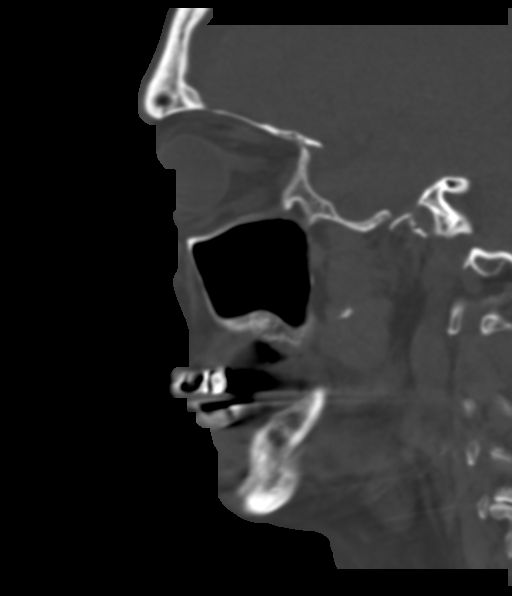

[15 of 47 positions shown; findings below may reference images not displayed]

FINDINGS: Paranasal sinuses:

Frontal: The frontal sinuses and frontal recesses are clear.

Ethmoid: There is opacification of some right ethmoid air cells and
a single left ethmoid air cell.

Maxillary: The maxillary sinuses are clear. The ostiomeatal units
are clear.

Sphenoid: The sphenoid sinuses and sphenoethmoid recesses are clear.

Right ostiomeatal unit: Patent.

Left ostiomeatal unit: Patent.

Nasal passages: Patent. There is slight leftward nasal septal
deviation.

Anatomy: No pneumatization superior to anterior ethmoid notches.
Symmetric and intact olfactory grooves and fovea ethmoidalis, Keros
I (1-3mm). Incomplete sellar sphenoid pneumatization pattern. No
dehiscence of carotid or optic canals. No onodi cell.

Other: The mastoid air cells are clear. Bilateral lens implants are
noted. The globes and orbits are otherwise unremarkable. The imaged
portions of the intracranial compartment are unremarkable.

There is advanced degenerative change of the temporomandibular
joints, right worse than left.
IMPRESSION: 1. Opacification of some ethmoid air cells, right more than left, as
above. Otherwise, clear sinuses.
2. Advanced degenerative change of the temporomandibular joints,
right worse than left.
